# Patient Record
Sex: Male | Born: 1940 | Race: White | Hispanic: No | Marital: Married | Smoking: Former smoker
Health system: Southern US, Community
[De-identification: ages and names within clinical notes are randomized; demographics above are authoritative.]

## PROBLEM LIST (undated history)

## (undated) DIAGNOSIS — K760 Fatty (change of) liver, not elsewhere classified: Secondary | ICD-10-CM

## (undated) DIAGNOSIS — F410 Panic disorder [episodic paroxysmal anxiety] without agoraphobia: Secondary | ICD-10-CM

## (undated) DIAGNOSIS — N189 Chronic kidney disease, unspecified: Secondary | ICD-10-CM

## (undated) DIAGNOSIS — E119 Type 2 diabetes mellitus without complications: Secondary | ICD-10-CM

## (undated) DIAGNOSIS — G20A1 Parkinson's disease without dyskinesia, without mention of fluctuations: Secondary | ICD-10-CM

## (undated) DIAGNOSIS — N529 Male erectile dysfunction, unspecified: Secondary | ICD-10-CM

## (undated) DIAGNOSIS — G20 Parkinson's disease: Secondary | ICD-10-CM

## (undated) DIAGNOSIS — K219 Gastro-esophageal reflux disease without esophagitis: Secondary | ICD-10-CM

## (undated) DIAGNOSIS — Z860101 Personal history of adenomatous and serrated colon polyps: Secondary | ICD-10-CM

## (undated) DIAGNOSIS — E785 Hyperlipidemia, unspecified: Secondary | ICD-10-CM

## (undated) DIAGNOSIS — E039 Hypothyroidism, unspecified: Secondary | ICD-10-CM

## (undated) DIAGNOSIS — Z86010 Personal history of colonic polyps: Secondary | ICD-10-CM

## (undated) DIAGNOSIS — I10 Essential (primary) hypertension: Secondary | ICD-10-CM

## (undated) DIAGNOSIS — M109 Gout, unspecified: Secondary | ICD-10-CM

## (undated) DIAGNOSIS — L409 Psoriasis, unspecified: Secondary | ICD-10-CM

## (undated) HISTORY — DX: Fatty (change of) liver, not elsewhere classified: K76.0

## (undated) HISTORY — DX: Gastro-esophageal reflux disease without esophagitis: K21.9

## (undated) HISTORY — DX: Psoriasis, unspecified: L40.9

## (undated) HISTORY — DX: Personal history of adenomatous and serrated colon polyps: Z86.0101

## (undated) HISTORY — DX: Personal history of colonic polyps: Z86.010

## (undated) HISTORY — DX: Panic disorder (episodic paroxysmal anxiety): F41.0

## (undated) HISTORY — DX: Male erectile dysfunction, unspecified: N52.9

## (undated) HISTORY — DX: Type 2 diabetes mellitus without complications: E11.9

## (undated) HISTORY — DX: Chronic kidney disease, unspecified: N18.9

## (undated) HISTORY — DX: Hypothyroidism, unspecified: E03.9

## (undated) HISTORY — DX: Essential (primary) hypertension: I10

## (undated) HISTORY — DX: Hyperlipidemia, unspecified: E78.5

## (undated) HISTORY — DX: Gout, unspecified: M10.9

---

## 1999-08-24 ENCOUNTER — Encounter (INDEPENDENT_AMBULATORY_CARE_PROVIDER_SITE_OTHER): Payer: Self-pay | Admitting: *Deleted

## 1999-08-24 ENCOUNTER — Ambulatory Visit (HOSPITAL_COMMUNITY): Admission: RE | Admit: 1999-08-24 | Discharge: 1999-08-24 | Payer: Self-pay | Admitting: *Deleted

## 2002-10-13 ENCOUNTER — Encounter: Payer: Self-pay | Admitting: Family Medicine

## 2002-10-13 ENCOUNTER — Encounter: Admission: RE | Admit: 2002-10-13 | Discharge: 2002-10-13 | Payer: Self-pay | Admitting: Family Medicine

## 2003-01-03 ENCOUNTER — Encounter (INDEPENDENT_AMBULATORY_CARE_PROVIDER_SITE_OTHER): Payer: Self-pay | Admitting: Specialist

## 2003-01-03 ENCOUNTER — Ambulatory Visit (HOSPITAL_COMMUNITY): Admission: RE | Admit: 2003-01-03 | Discharge: 2003-01-03 | Payer: Self-pay | Admitting: *Deleted

## 2004-02-10 ENCOUNTER — Encounter: Admission: RE | Admit: 2004-02-10 | Discharge: 2004-02-10 | Payer: Self-pay | Admitting: Family Medicine

## 2004-02-17 ENCOUNTER — Encounter: Admission: RE | Admit: 2004-02-17 | Discharge: 2004-02-17 | Payer: Self-pay | Admitting: Family Medicine

## 2009-08-24 ENCOUNTER — Encounter: Admission: RE | Admit: 2009-08-24 | Discharge: 2009-08-24 | Payer: Self-pay | Admitting: Family Medicine

## 2010-10-19 NOTE — Op Note (Signed)
[place]  Patient:    [name], [name]                         MRN: [id number] Proc. Date: 08/24/99 Adm. Date:  [id number] Disc. Date: [id number] Attending:  Mingo Amber CC:         Dyanne Carrel, M.D.                           Operative Report  PROCEDURES:  Video upper endoscopy and video colonoscopy.  INDICATIONS FOR PROCEDURE:  This is a 70 year old male referred by Dr. Manus Gunning for guaiac positive stool and mild anemia.  He has been taking aspirin and he has a family history of colonic polyps.  PREPARATION:  He is NPO since midnight having taken Phospho-Soda prep and a clear liquid diet.  The mucosa is clean throughout.  PREPROCEDURE SEDATION:  Prior to the upper endoscopy, he received 100 mg of Demerol and 10 mg of versed intravenously.  In addition, he was on two liters of nasal cannula O2.  DESCRIPTION OF THE PROCEDURE:  The Olympus video upper endoscope was inserted via the mouth and advanced easily through the upper esophageal sphincter. Intubation was then carried out to the descending duodenum.  On withdrawal the mucosa was carefully evaluated.  The descending duodenum and bulb appeared normal as did the antrum of the stomach.  On retroflexed view of the GE junction and fundus it was apparent that there was mild fundic gastritis.  On withdrawal through the GE junction, there was minimal distal esophagitis.  At 39 cm a biopsy was taken.  At the conclusion of this procedure, the patients position was reversed for procedure #2, video colonoscopy.  He received an additional 40 mg of Demerol for sedation.  The Olympus video colonoscope was inserted via the rectum and advanced easily through the entire colon to the cecum.  On withdrawal the mucosa was carefully evaluated.  The entire right colon appeared normal. Within the sigmoid colon at 35 cm was a 2.5 cm polyp that was removed with electrosnare cautery and recovered for  histologic evaluation.  At the rectal area was a second 1 cm sessile polyp that was similarly snared.  Retroflexed view of the rectum was unremarkable.  The patient tolerated the procedures well.  Pulse, blood pressure, and oximetry testing were stable throughout.  He was observed in recovery 45 minutes and discharged home alert with a benign abdomen.  As of the time of this dictation, the CLO test is available and is negative and pathology reports revealed mild reflux esophagitis without Barretts metaplasia.  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  The patient returned to the office p.r.n. symptoms.  He needs a repeat colonoscopy in three years to assess for any recurrence of polyps. DD:  10/04/99 TD:  10/07/99 Job: [id number]

## 2011-10-29 ENCOUNTER — Other Ambulatory Visit: Payer: Self-pay | Admitting: Family Medicine

## 2011-10-29 ENCOUNTER — Ambulatory Visit
Admission: RE | Admit: 2011-10-29 | Discharge: 2011-10-29 | Disposition: A | Discharge status: Home / Self Care | Payer: No Typology Code available for payment source | Source: Ambulatory Visit | Attending: Family Medicine | Admitting: Family Medicine

## 2011-10-29 DIAGNOSIS — Z006 Encounter for examination for normal comparison and control in clinical research program: Secondary | ICD-10-CM

## 2012-02-28 ENCOUNTER — Encounter: Payer: Self-pay | Admitting: Cardiovascular Disease

## 2014-06-06 DIAGNOSIS — L414 Large plaque parapsoriasis: Secondary | ICD-10-CM | POA: Diagnosis not present

## 2014-09-02 ENCOUNTER — Encounter: Payer: Self-pay | Admitting: *Deleted

## 2014-10-21 DIAGNOSIS — I129 Hypertensive chronic kidney disease with stage 1 through stage 4 chronic kidney disease, or unspecified chronic kidney disease: Secondary | ICD-10-CM | POA: Diagnosis not present

## 2014-10-21 DIAGNOSIS — N400 Enlarged prostate without lower urinary tract symptoms: Secondary | ICD-10-CM | POA: Diagnosis not present

## 2014-10-21 DIAGNOSIS — L409 Psoriasis, unspecified: Secondary | ICD-10-CM | POA: Diagnosis not present

## 2014-10-21 DIAGNOSIS — E039 Hypothyroidism, unspecified: Secondary | ICD-10-CM | POA: Diagnosis not present

## 2014-10-21 DIAGNOSIS — E1129 Type 2 diabetes mellitus with other diabetic kidney complication: Secondary | ICD-10-CM | POA: Diagnosis not present

## 2014-10-21 DIAGNOSIS — E780 Pure hypercholesterolemia: Secondary | ICD-10-CM | POA: Diagnosis not present

## 2014-10-21 DIAGNOSIS — K219 Gastro-esophageal reflux disease without esophagitis: Secondary | ICD-10-CM | POA: Diagnosis not present

## 2014-10-21 DIAGNOSIS — N183 Chronic kidney disease, stage 3 (moderate): Secondary | ICD-10-CM | POA: Diagnosis not present

## 2015-01-03 DIAGNOSIS — D126 Benign neoplasm of colon, unspecified: Secondary | ICD-10-CM | POA: Diagnosis not present

## 2015-01-03 DIAGNOSIS — D120 Benign neoplasm of cecum: Secondary | ICD-10-CM | POA: Diagnosis not present

## 2015-01-03 DIAGNOSIS — D123 Benign neoplasm of transverse colon: Secondary | ICD-10-CM | POA: Diagnosis not present

## 2015-01-03 DIAGNOSIS — K5730 Diverticulosis of large intestine without perforation or abscess without bleeding: Secondary | ICD-10-CM | POA: Diagnosis not present

## 2015-01-03 DIAGNOSIS — D125 Benign neoplasm of sigmoid colon: Secondary | ICD-10-CM | POA: Diagnosis not present

## 2015-01-03 DIAGNOSIS — K635 Polyp of colon: Secondary | ICD-10-CM | POA: Diagnosis not present

## 2015-01-03 DIAGNOSIS — Z86010 Personal history of colonic polyps: Secondary | ICD-10-CM | POA: Diagnosis not present

## 2015-01-03 DIAGNOSIS — Z09 Encounter for follow-up examination after completed treatment for conditions other than malignant neoplasm: Secondary | ICD-10-CM | POA: Diagnosis not present

## 2015-04-04 DIAGNOSIS — M25512 Pain in left shoulder: Secondary | ICD-10-CM | POA: Diagnosis not present

## 2015-04-07 DIAGNOSIS — M7542 Impingement syndrome of left shoulder: Secondary | ICD-10-CM | POA: Diagnosis not present

## 2015-04-07 DIAGNOSIS — M25512 Pain in left shoulder: Secondary | ICD-10-CM | POA: Diagnosis not present

## 2015-04-24 DIAGNOSIS — E1129 Type 2 diabetes mellitus with other diabetic kidney complication: Secondary | ICD-10-CM | POA: Diagnosis not present

## 2015-04-24 DIAGNOSIS — L409 Psoriasis, unspecified: Secondary | ICD-10-CM | POA: Diagnosis not present

## 2015-04-24 DIAGNOSIS — N183 Chronic kidney disease, stage 3 (moderate): Secondary | ICD-10-CM | POA: Diagnosis not present

## 2015-04-24 DIAGNOSIS — N400 Enlarged prostate without lower urinary tract symptoms: Secondary | ICD-10-CM | POA: Diagnosis not present

## 2015-04-24 DIAGNOSIS — E7800 Pure hypercholesterolemia, unspecified: Secondary | ICD-10-CM | POA: Diagnosis not present

## 2015-04-24 DIAGNOSIS — I129 Hypertensive chronic kidney disease with stage 1 through stage 4 chronic kidney disease, or unspecified chronic kidney disease: Secondary | ICD-10-CM | POA: Diagnosis not present

## 2015-04-24 DIAGNOSIS — E039 Hypothyroidism, unspecified: Secondary | ICD-10-CM | POA: Diagnosis not present

## 2015-04-24 DIAGNOSIS — K219 Gastro-esophageal reflux disease without esophagitis: Secondary | ICD-10-CM | POA: Diagnosis not present

## 2015-05-22 DIAGNOSIS — E119 Type 2 diabetes mellitus without complications: Secondary | ICD-10-CM | POA: Diagnosis not present

## 2015-08-07 DIAGNOSIS — J069 Acute upper respiratory infection, unspecified: Secondary | ICD-10-CM | POA: Diagnosis not present

## 2015-08-08 DIAGNOSIS — H1012 Acute atopic conjunctivitis, left eye: Secondary | ICD-10-CM | POA: Diagnosis not present

## 2015-08-15 DIAGNOSIS — H1012 Acute atopic conjunctivitis, left eye: Secondary | ICD-10-CM | POA: Diagnosis not present

## 2015-10-23 DIAGNOSIS — Z1389 Encounter for screening for other disorder: Secondary | ICD-10-CM | POA: Diagnosis not present

## 2015-10-23 DIAGNOSIS — I129 Hypertensive chronic kidney disease with stage 1 through stage 4 chronic kidney disease, or unspecified chronic kidney disease: Secondary | ICD-10-CM | POA: Diagnosis not present

## 2015-10-23 DIAGNOSIS — L409 Psoriasis, unspecified: Secondary | ICD-10-CM | POA: Diagnosis not present

## 2015-10-23 DIAGNOSIS — N183 Chronic kidney disease, stage 3 (moderate): Secondary | ICD-10-CM | POA: Diagnosis not present

## 2015-10-23 DIAGNOSIS — E039 Hypothyroidism, unspecified: Secondary | ICD-10-CM | POA: Diagnosis not present

## 2015-10-23 DIAGNOSIS — E1129 Type 2 diabetes mellitus with other diabetic kidney complication: Secondary | ICD-10-CM | POA: Diagnosis not present

## 2015-10-23 DIAGNOSIS — Z23 Encounter for immunization: Secondary | ICD-10-CM | POA: Diagnosis not present

## 2015-10-23 DIAGNOSIS — F411 Generalized anxiety disorder: Secondary | ICD-10-CM | POA: Diagnosis not present

## 2015-10-23 DIAGNOSIS — F39 Unspecified mood [affective] disorder: Secondary | ICD-10-CM | POA: Diagnosis not present

## 2015-10-23 DIAGNOSIS — E7800 Pure hypercholesterolemia, unspecified: Secondary | ICD-10-CM | POA: Diagnosis not present

## 2015-10-23 DIAGNOSIS — N400 Enlarged prostate without lower urinary tract symptoms: Secondary | ICD-10-CM | POA: Diagnosis not present

## 2016-05-02 DIAGNOSIS — F411 Generalized anxiety disorder: Secondary | ICD-10-CM | POA: Diagnosis not present

## 2016-05-02 DIAGNOSIS — F39 Unspecified mood [affective] disorder: Secondary | ICD-10-CM | POA: Diagnosis not present

## 2016-05-02 DIAGNOSIS — N183 Chronic kidney disease, stage 3 (moderate): Secondary | ICD-10-CM | POA: Diagnosis not present

## 2016-05-02 DIAGNOSIS — E1129 Type 2 diabetes mellitus with other diabetic kidney complication: Secondary | ICD-10-CM | POA: Diagnosis not present

## 2016-05-02 DIAGNOSIS — E039 Hypothyroidism, unspecified: Secondary | ICD-10-CM | POA: Diagnosis not present

## 2016-05-02 DIAGNOSIS — E7800 Pure hypercholesterolemia, unspecified: Secondary | ICD-10-CM | POA: Diagnosis not present

## 2016-05-02 DIAGNOSIS — L409 Psoriasis, unspecified: Secondary | ICD-10-CM | POA: Diagnosis not present

## 2016-05-02 DIAGNOSIS — I129 Hypertensive chronic kidney disease with stage 1 through stage 4 chronic kidney disease, or unspecified chronic kidney disease: Secondary | ICD-10-CM | POA: Diagnosis not present

## 2016-05-02 DIAGNOSIS — N400 Enlarged prostate without lower urinary tract symptoms: Secondary | ICD-10-CM | POA: Diagnosis not present

## 2016-07-01 DIAGNOSIS — H1013 Acute atopic conjunctivitis, bilateral: Secondary | ICD-10-CM | POA: Diagnosis not present

## 2016-07-08 DIAGNOSIS — E119 Type 2 diabetes mellitus without complications: Secondary | ICD-10-CM | POA: Diagnosis not present

## 2016-11-05 DIAGNOSIS — E7800 Pure hypercholesterolemia, unspecified: Secondary | ICD-10-CM | POA: Diagnosis not present

## 2016-11-05 DIAGNOSIS — F411 Generalized anxiety disorder: Secondary | ICD-10-CM | POA: Diagnosis not present

## 2016-11-05 DIAGNOSIS — I129 Hypertensive chronic kidney disease with stage 1 through stage 4 chronic kidney disease, or unspecified chronic kidney disease: Secondary | ICD-10-CM | POA: Diagnosis not present

## 2016-11-05 DIAGNOSIS — N183 Chronic kidney disease, stage 3 (moderate): Secondary | ICD-10-CM | POA: Diagnosis not present

## 2016-11-05 DIAGNOSIS — E1129 Type 2 diabetes mellitus with other diabetic kidney complication: Secondary | ICD-10-CM | POA: Diagnosis not present

## 2016-11-05 DIAGNOSIS — Z7984 Long term (current) use of oral hypoglycemic drugs: Secondary | ICD-10-CM | POA: Diagnosis not present

## 2016-11-05 DIAGNOSIS — E039 Hypothyroidism, unspecified: Secondary | ICD-10-CM | POA: Diagnosis not present

## 2016-11-05 DIAGNOSIS — Z1389 Encounter for screening for other disorder: Secondary | ICD-10-CM | POA: Diagnosis not present

## 2016-11-05 DIAGNOSIS — N400 Enlarged prostate without lower urinary tract symptoms: Secondary | ICD-10-CM | POA: Diagnosis not present

## 2017-04-01 DIAGNOSIS — H5210 Myopia, unspecified eye: Secondary | ICD-10-CM | POA: Diagnosis not present

## 2017-04-01 DIAGNOSIS — H31012 Macula scars of posterior pole (postinflammatory) (post-traumatic), left eye: Secondary | ICD-10-CM | POA: Diagnosis not present

## 2017-05-07 ENCOUNTER — Encounter: Payer: Self-pay | Admitting: Cardiovascular Disease

## 2017-05-07 DIAGNOSIS — I129 Hypertensive chronic kidney disease with stage 1 through stage 4 chronic kidney disease, or unspecified chronic kidney disease: Secondary | ICD-10-CM | POA: Diagnosis not present

## 2017-05-07 DIAGNOSIS — E7800 Pure hypercholesterolemia, unspecified: Secondary | ICD-10-CM | POA: Diagnosis not present

## 2017-05-07 DIAGNOSIS — E1129 Type 2 diabetes mellitus with other diabetic kidney complication: Secondary | ICD-10-CM | POA: Diagnosis not present

## 2017-05-07 DIAGNOSIS — L409 Psoriasis, unspecified: Secondary | ICD-10-CM | POA: Diagnosis not present

## 2017-05-07 DIAGNOSIS — F411 Generalized anxiety disorder: Secondary | ICD-10-CM | POA: Diagnosis not present

## 2017-05-07 DIAGNOSIS — E039 Hypothyroidism, unspecified: Secondary | ICD-10-CM | POA: Diagnosis not present

## 2017-05-07 DIAGNOSIS — N400 Enlarged prostate without lower urinary tract symptoms: Secondary | ICD-10-CM | POA: Diagnosis not present

## 2017-05-07 DIAGNOSIS — F39 Unspecified mood [affective] disorder: Secondary | ICD-10-CM | POA: Diagnosis not present

## 2017-05-07 DIAGNOSIS — N183 Chronic kidney disease, stage 3 (moderate): Secondary | ICD-10-CM | POA: Diagnosis not present

## 2017-07-10 ENCOUNTER — Encounter: Payer: Self-pay | Admitting: Cardiovascular Disease

## 2017-07-10 DIAGNOSIS — R0600 Dyspnea, unspecified: Secondary | ICD-10-CM | POA: Diagnosis not present

## 2017-07-10 DIAGNOSIS — R001 Bradycardia, unspecified: Secondary | ICD-10-CM | POA: Diagnosis not present

## 2017-07-14 ENCOUNTER — Encounter: Payer: Self-pay | Admitting: Cardiovascular Disease

## 2017-07-14 ENCOUNTER — Ambulatory Visit: Payer: Medicare HMO | Admitting: Cardiovascular Disease

## 2017-07-14 VITALS — BP 132/80 | HR 61 | Ht 72.0 in | Wt 354.6 lb

## 2017-07-14 DIAGNOSIS — R0683 Snoring: Secondary | ICD-10-CM

## 2017-07-14 DIAGNOSIS — R0600 Dyspnea, unspecified: Secondary | ICD-10-CM

## 2017-07-14 DIAGNOSIS — Z6841 Body Mass Index (BMI) 40.0 and over, adult: Secondary | ICD-10-CM

## 2017-07-14 DIAGNOSIS — E039 Hypothyroidism, unspecified: Secondary | ICD-10-CM

## 2017-07-14 DIAGNOSIS — L409 Psoriasis, unspecified: Secondary | ICD-10-CM

## 2017-07-14 DIAGNOSIS — I10 Essential (primary) hypertension: Secondary | ICD-10-CM | POA: Diagnosis not present

## 2017-07-14 DIAGNOSIS — I4589 Other specified conduction disorders: Secondary | ICD-10-CM

## 2017-07-14 DIAGNOSIS — R0681 Apnea, not elsewhere classified: Secondary | ICD-10-CM

## 2017-07-14 MED ORDER — FUROSEMIDE 40 MG PO TABS: 40.0000 mg | ORAL_TABLET | Freq: Every day | ORAL | 3 refills | Status: DC

## 2017-07-14 NOTE — Patient Instructions (Signed)
Medication Instructions:  INCREASE furosemide (Lasix) to 40 mg daily  Testing/Procedures: Your physician has requested that you have an echocardiogram THIS WEEK. Echocardiography is a painless test that uses sound waves to create images of your heart. It provides your doctor with information about the size and shape of your heart and how well your heart's chambers and valves are working. This procedure takes approximately one hour. There are no restrictions for this procedure.  This will be done at our [facility] location:  [place] has recommended that you have a sleep study ASAP. This test records several body functions during sleep, including: brain activity, eye movement, oxygen and carbon dioxide blood levels, heart rate and rhythm, breathing rate and rhythm, the flow of air through your mouth and nose, snoring, body muscle movements, and chest and belly movement. --this must be pre-approved by insurance prior to scheduling. Once approved, someone will call to schedule.  Your physician has recommended that you wear a 14 day event monitor. Event monitors are medical devices that record the heart's electrical activity. Doctors most often [place] these monitors to diagnose arrhythmias. Arrhythmias are problems with the speed or rhythm of the heartbeat. The monitor is a small, portable device. You can wear one while you do your normal daily activities. This is usually used to diagnose what is causing palpitations/syncope (passing out).   Follow-Up: 3-4 weeks with Dr. Claiborne Billings.  Any Other Special Instructions Will Be Listed Below (If Applicable).     If you need a refill on your cardiac medications before your next appointment, please call your pharmacy.

## 2017-07-14 NOTE — Progress Notes (Signed)
Cardiology Office Note    Date:  07/21/2017   ID:  [name], DOB 1940-10-25, MRN [id number]  PCP:  Mike Arabian, MD  Cardiologist:  Mike Majestic, MD   New cardiology evaluation, referred through the courtesy of Dr. Gaynelle Green for evaluation of bradycardia and increasing shortness of breath.  History of Present Illness:  [name] is a 77 y.o. male who is referred through the courtesy of Dr. Marisue Green for evaluation of bradycardia and shortness of breath.  The patient is the first cousin of [name] who recently established cardiology care with me.  [name] has history of hypertension, diabetes mellitus, hyperlipidemia, in addition to hypothyroidism.  He has had a history of presumed anxiety and panic attacks.  He admits to significant dyspnea on exertion and also has noticed episodes of significant shortness of breath while sleeping.  Upon further questioning, patient omits to loud snoring.  He wakes up gasping for breath and then apparently has a panic attack.  Sleep is nonrestorative.  He admits to daytime sleepiness.  He has never been evaluated for sleep apnea.  He was recently seen by [name] and his ECG was reported to show sinus bradycardia with heart rates in the low 40s.  He denies any presyncope or syncope.  He is not very active.  He has morbid obesity.  He also has a significant history for psoriasis.  Laboratory in December 2018 by MikeEhinger had shown mild renal insufficiency with a creatinine of 1.50.  Cholesterol was 119 with an LDL of 64.  He presents for cardiology evaluation  Past Medical History:  Diagnosis Date  . CKD (chronic kidney disease)   . DM type 2 (diabetes mellitus, type 2) ([place])   . ED (erectile dysfunction)   . Fatty liver   . GERD (gastroesophageal reflux disease)   . Gout   . H/O adenomatous polyp of colon   . HLD (hyperlipidemia)   . HTN (hypertension)   . Hypothyroid   . Panic attacks   . Psoriasis     Past medical history is  notable for type 2 diabetes mellitus, hyperlipidemia, hypothyroidism, hypertension, obesity, psoriasis, GERD, erectile dysfunction, chronic kidney disease.  Current Medications: Outpatient Medications Prior to Visit  Medication Sig Dispense Refill  . ALPRAZolam (XANAX) 1 MG tablet Take 1 tablet by mouth as directed.    Marland Kitchen aspirin 81 MG EC tablet Take 1 tablet by mouth daily.    Marland Kitchen atorvastatin (LIPITOR) 40 MG tablet Take by mouth daily. Patient takes 20 mg daily    . glimepiride (AMARYL) 1 MG tablet Take 1 tablet by mouth daily.    Marland Kitchen levothyroxine (SYNTHROID, LEVOTHROID) 175 MCG tablet Take 1 tablet by mouth daily.    Marland Kitchen losartan (COZAAR) 50 MG tablet Take 1 tablet by mouth daily.    . metFORMIN (GLUCOPHAGE) 1000 MG tablet Take 1 tablet by mouth daily.    Marland Kitchen PARoxetine (PAXIL) 40 MG tablet Take 1 tablet by mouth as directed.    . furosemide (LASIX) 20 MG tablet Take 1 tablet by mouth daily.  2   No facility-administered medications prior to visit.      Allergies:   Hctz [hydrochlorothiazide]; Lexapro [escitalopram oxalate]; and Lisinopril   Social History   Socioeconomic History  . Marital status: Married    Spouse name: None  . Number of children: None  . Years of education: None  . Highest education level: None  Social Needs  . Financial resource strain: None  .  Food insecurity - worry: None  . Food insecurity - inability: None  . Transportation needs - medical: None  . Transportation needs - non-medical: None  Occupational History  . None  Tobacco Use  . Smoking status: Never Smoker  . Smokeless tobacco: Never Used  Substance and Sexual Activity  . Alcohol use: No    Alcohol/week: 0.0 oz  . Drug use: No  . Sexual activity: None  Other Topics Concern  . None  Social History Narrative  . None    Additional social history is notable that he is married 47 years.  He has 4 children, 9 grandchildren, and 4 great-grandchildren and 5 great great-grandchildren.  He completed 2  years of college.  He is retired from [employer] .  There is no tobacco use.  He does not exercise.  Family History:  Family history is notable that his mother died at age 44.  She never recovered after she broke her hip.  Her father died at age 18 and had emphysema.  One brother died of an MI at age 30.  Another brother died at age 56 and had methicillin-resistant staph infection.  One sister died at age 39 with cancer, another sister had lung cancer, and one sister is living at 46.  Patient's 4 children   ROS General: Negative; No fevers, chills, or night sweats;  HEENT: Negative; No changes in vision or hearing, sinus congestion, difficulty swallowing Pulmonary: Positive for shortness of breath and wheezing Cardiovascular: see HPI GI: Negative; No nausea, vomiting, diarrhea, or abdominal pain GU: Negative; No dysuria, hematuria, or difficulty voiding Musculoskeletal: History of gout Hematologic/Oncology: Negative; no easy bruising, bleeding Endocrine: Positive for hypothyroidism and type 2 diabetes mellitus Neuro: Negative; no changes in balance, headaches Skin: Negative; No rashes or skin lesions Psychiatric: Negative; No behavioral problems, depression Sleep: Negative; No snoring, daytime sleepiness, hypersomnolence, bruxism, restless legs, hypnogognic hallucinations, no cataplexy Other comprehensive 14 point system review is negative.   PHYSICAL EXAM:   VS:  BP 132/80   Pulse 61   Ht 6' (1.829 m)   Wt (!) 354 lb 9.6 oz (160.8 kg)   BMI 48.09 kg/m     Repeat blood pressure by me 112/80.  Wt Readings from Last 3 Encounters:  07/14/17 (!) 354 lb 9.6 oz (160.8 kg)    General: Alert, oriented, no distress.  Skin: Numerous psoriatic lesions HEENT: Normocephalic, atraumatic. Pupils equal round and reactive to light; sclera anicteric; extraocular muscles intact; Fundi no hemorrhages or exudates.  Disks flat Nose without nasal septal hypertrophy Mouth/Parynx benign; Mallinpatti  scale 3/4 Neck: No JVD, no carotid bruits; normal carotid upstroke Lungs: Rhonchi and decreased breath sounds bilaterally Chest wall: without tenderness to palpitation Heart: PMI not displaced, RRR, s1 s2 normal, 1/6 systolic murmur, no diastolic murmur, no rubs, gallops, thrills, or heaves Abdomen: Significant central adiposity; soft, nontender; no hepatosplenomehaly, BS+; abdominal aorta nontender and not dilated by palpation. Back: no CVA tenderness Pulses 2+ Musculoskeletal: full range of motion, normal strength, no joint deformities Extremities: Trace ankle edema bilaterally, no clubbing cyanosis, Homan's sign negative  Neurologic: grossly nonfocal; Cranial nerves grossly wnl Psychologic: Normal mood and affect   Studies/Labs Reviewed:   EKG:  EKG is ordered today.  ECG (independently read by me): Probable A-V dissociation with atrial rate approximatel 83 bpm.  Ventricular rate is variable in the 60s to 70 range with right bundle branch block morphology.  Left axis deviation.  Recent Labs: No flowsheet data found.   No  flowsheet data found.  No flowsheet data found. No results found for: MCV No results found for: TSH No results found for: HGBA1C   BNP No results found for: BNP  ProBNP No results found for: PROBNP   Lipid Panel  No results found for: CHOL, TRIG, HDL, CHOLHDL, VLDL, LDLCALC, LDLDIRECT   RADIOLOGY: No results found.   Additional studies/ records that were reviewed today include:  I reviewed the office records from Dr. Gaynelle Green    ASSESSMENT:    1. Dyspnea, unspecified type   2. Hypertension, unspecified type   3. Class 3 severe obesity with body mass index (BMI) of 45.0 to 49.9 in adult, unspecified obesity type, unspecified whether serious comorbidity present ([place])   4. Snoring   5. Atrioventricular (AV) dissociation   6. Witnessed episode of apnea   7. Morbid obesity ([place])   8. Hypothyroidism, unspecified type   9. Psoriasis       PLAN:  Mr. Beck Cofer is a 77 year old gentleman who has a history of morbid obesity, type 2 diabetes mellitus, hypothyroidism, hyperlipidemia, psoriasis, who is developed exertional dyspnea.  He also has been labeled as having frequent panic attacks.  Upon further questioning, the patient often wakes up gasping for breath while sleeping and I suspect some of this may be component of severe obstructive sleep apnea.  He has a history of loud snoring, cannot sleep on his back, his sleep is nonrestorative, and he admits to daytime fatigue.  I am recommending he undergo an echo Doppler study to evaluate both systolic and diastolic function as well as valvular architecture.  I reviewed the ECGs from his primary care's office. On one ECG there was suggestion of sinus bradycardia.  However, on the other ECG there was suggestion of possible A-V dissociation.  His ECG done today in the office today shows atrial rate at approximately 83 and a ventricular rate at 61 with right bundle branch morphology to the ventricular beat.  He has been on a medical regimen consisting of furosemide 20 mg, losartan 50 [place] grams daily for blood pressure.  I am recommending further titration of furosemide 40 mg daily, which will also improve his shortness of breath.  I suspect he may very well have diastolic dysfunction.  He was previously on metoprolol but this was stopped 2-3 days ago, which may still be present in his system to account for some of his bradycardia arrhythmia and possible A-V dissociation.  I suspect he has obstructive sleep apnea which may also be contributing to nocturnal bradycardia arrhythmia.  I am scheduling him for a sleep study for further evaluation.  I will also schedule him for a cardiac monitor to evaluate his cardiac rhythm once metoprolol has been weaned out of his system.  I will see him in the office in 3-4 weeks for reevaluation.  Medication Adjustments/Labs and Tests Ordered: Current  medicines are reviewed at length with the patient today.  Concerns regarding medicines are outlined above.  Medication changes, Labs and Tests ordered today are listed in the Patient Instructions below. Patient Instructions  Medication Instructions:  INCREASE furosemide (Lasix) to 40 mg daily  Testing/Procedures: Your physician has requested that you have an echocardiogram THIS WEEK. Echocardiography is a painless test that uses sound waves to create images of your heart. It provides your doctor with information about the size and shape of your heart and how well your heart's chambers and valves are working. This procedure takes approximately one hour. There are no restrictions  for this procedure.  This will be done at our [facility] location:  [place] has recommended that you have a sleep study ASAP. This test records several body functions during sleep, including: brain activity, eye movement, oxygen and carbon dioxide blood levels, heart rate and rhythm, breathing rate and rhythm, the flow of air through your mouth and nose, snoring, body muscle movements, and chest and belly movement. --this must be pre-approved by insurance prior to scheduling. Once approved, someone will call to schedule.  Your physician has recommended that you wear a 14 day event monitor. Event monitors are medical devices that record the heart's electrical activity. Doctors most often [place] these monitors to diagnose arrhythmias. Arrhythmias are problems with the speed or rhythm of the heartbeat. The monitor is a small, portable device. You can wear one while you do your normal daily activities. This is usually used to diagnose what is causing palpitations/syncope (passing out).   Follow-Up: 3-4 weeks with Dr. Claiborne Billings.  Any Other Special Instructions Will Be Listed Below (If Applicable).     If you need a refill on your cardiac medications before your next appointment, please call  your pharmacy.      Signed, Mike Majestic, MD  07/21/2017 1:04 PM    [facility] [address], [place], [place], [place]  [address] Phone: [phone]

## 2017-07-17 ENCOUNTER — Other Ambulatory Visit: Payer: Self-pay

## 2017-07-17 ENCOUNTER — Ambulatory Visit (HOSPITAL_COMMUNITY): Payer: Medicare HMO | Attending: Internal Medicine

## 2017-07-17 DIAGNOSIS — I4589 Other specified conduction disorders: Secondary | ICD-10-CM

## 2017-07-17 DIAGNOSIS — I129 Hypertensive chronic kidney disease with stage 1 through stage 4 chronic kidney disease, or unspecified chronic kidney disease: Secondary | ICD-10-CM | POA: Diagnosis not present

## 2017-07-17 DIAGNOSIS — E1122 Type 2 diabetes mellitus with diabetic chronic kidney disease: Secondary | ICD-10-CM | POA: Diagnosis not present

## 2017-07-17 DIAGNOSIS — R0600 Dyspnea, unspecified: Secondary | ICD-10-CM | POA: Diagnosis not present

## 2017-07-17 DIAGNOSIS — I10 Essential (primary) hypertension: Secondary | ICD-10-CM | POA: Diagnosis not present

## 2017-07-17 DIAGNOSIS — N189 Chronic kidney disease, unspecified: Secondary | ICD-10-CM | POA: Diagnosis not present

## 2017-07-17 DIAGNOSIS — E785 Hyperlipidemia, unspecified: Secondary | ICD-10-CM | POA: Diagnosis not present

## 2017-07-17 MED ORDER — PERFLUTREN LIPID MICROSPHERE
1.0000 mL | INTRAVENOUS | Status: AC | PRN
  Administered 2017-07-17: 3 mL via INTRAVENOUS

## 2017-07-21 ENCOUNTER — Encounter: Payer: Self-pay | Admitting: Cardiovascular Disease

## 2017-07-23 ENCOUNTER — Ambulatory Visit (INDEPENDENT_AMBULATORY_CARE_PROVIDER_SITE_OTHER): Payer: Medicare HMO

## 2017-07-23 ENCOUNTER — Other Ambulatory Visit: Payer: Self-pay | Admitting: Cardiovascular Disease

## 2017-07-23 DIAGNOSIS — R0600 Dyspnea, unspecified: Secondary | ICD-10-CM

## 2017-07-23 DIAGNOSIS — I4589 Other specified conduction disorders: Secondary | ICD-10-CM

## 2017-07-23 DIAGNOSIS — R001 Bradycardia, unspecified: Secondary | ICD-10-CM | POA: Diagnosis not present

## 2017-07-29 ENCOUNTER — Telehealth: Payer: Self-pay | Admitting: Cardiovascular Disease

## 2017-07-29 NOTE — Telephone Encounter (Signed)
Returned call to patient. Explained that he should wear the monitor to the sleep study appointment and if he has issues, he can remove it for the duration of the sleep study. Advised it was ordered to evaluate bradycardia so it is advisable to wear as long as possible. He states he gets all tangled up in the monitor.   Also notified patient of his echo results while on the phone.

## 2017-07-29 NOTE — Telephone Encounter (Signed)
New Message   Per pt wants to know if he can discontinue monitor Friday since he has to come in and take sleep test. Requesting call back

## 2017-07-29 NOTE — Telephone Encounter (Signed)
Follow Up ° ° °Pt returning phone call. Requesting call back. °

## 2017-07-29 NOTE — Telephone Encounter (Signed)
Attempted to return call to patient. Phone rang, no answer 

## 2017-08-01 ENCOUNTER — Ambulatory Visit (HOSPITAL_BASED_OUTPATIENT_CLINIC_OR_DEPARTMENT_OTHER): Payer: Medicare HMO | Attending: Cardiovascular Disease | Admitting: Cardiovascular Disease

## 2017-08-01 VITALS — Ht 72.0 in | Wt 352.0 lb

## 2017-08-01 DIAGNOSIS — R0681 Apnea, not elsewhere classified: Secondary | ICD-10-CM

## 2017-08-01 DIAGNOSIS — R0683 Snoring: Secondary | ICD-10-CM | POA: Diagnosis not present

## 2017-08-01 DIAGNOSIS — Z79899 Other long term (current) drug therapy: Secondary | ICD-10-CM | POA: Diagnosis not present

## 2017-08-01 DIAGNOSIS — R0600 Dyspnea, unspecified: Secondary | ICD-10-CM

## 2017-08-01 DIAGNOSIS — Z6841 Body Mass Index (BMI) 40.0 and over, adult: Secondary | ICD-10-CM

## 2017-08-01 DIAGNOSIS — G4733 Obstructive sleep apnea (adult) (pediatric): Secondary | ICD-10-CM | POA: Diagnosis not present

## 2017-08-01 DIAGNOSIS — Z79890 Hormone replacement therapy (postmenopausal): Secondary | ICD-10-CM | POA: Diagnosis not present

## 2017-08-01 DIAGNOSIS — I10 Essential (primary) hypertension: Secondary | ICD-10-CM | POA: Diagnosis not present

## 2017-08-01 DIAGNOSIS — G4736 Sleep related hypoventilation in conditions classified elsewhere: Secondary | ICD-10-CM | POA: Insufficient documentation

## 2017-08-01 DIAGNOSIS — Z7984 Long term (current) use of oral hypoglycemic drugs: Secondary | ICD-10-CM | POA: Diagnosis not present

## 2017-08-01 DIAGNOSIS — G4730 Sleep apnea, unspecified: Secondary | ICD-10-CM | POA: Diagnosis present

## 2017-08-01 DIAGNOSIS — Z7982 Long term (current) use of aspirin: Secondary | ICD-10-CM | POA: Insufficient documentation

## 2017-08-01 DIAGNOSIS — I4589 Other specified conduction disorders: Secondary | ICD-10-CM

## 2017-08-10 ENCOUNTER — Encounter (HOSPITAL_BASED_OUTPATIENT_CLINIC_OR_DEPARTMENT_OTHER): Payer: Self-pay | Admitting: Cardiovascular Disease

## 2017-08-10 NOTE — Procedures (Signed)
Patient Name: [name], [name] Date: 08/01/2017 Gender: Male D.O.B: 1941-04-24 Age (years): 54 Referring Provider: Shelva Majestic MD, ABSM Height (inches): 72 Interpreting Physician: Shelva Majestic MD, ABSM Weight (lbs): 352 RPSGT: Lanae Boast BMI: 48 MRN: [id number] Neck Size: 21.00  CLINICAL INFORMATION Sleep Study Type: Split Night CPAP  Indication for sleep study: Hypertension, Witnessed Apneas  Epworth Sleepiness Score: 4  SLEEP STUDY TECHNIQUE As per the AASM Manual for the Scoring of Sleep and Associated Events v2.3 (April 2016) with a hypopnea requiring 4% desaturations.  The channels recorded and monitored were frontal, central and occipital EEG, electrooculogram (EOG), submentalis EMG (chin), nasal and oral airflow, thoracic and abdominal wall motion, anterior tibialis EMG, snore microphone, electrocardiogram, and pulse oximetry. Continuous positive airway pressure (CPAP) was initiated when the patient met split night criteria and was titrated according to treat sleep-disordered breathing.  MEDICATIONS     ALPRAZolam (XANAX) 1 MG tablet             aspirin 81 MG EC tablet         atorvastatin (LIPITOR) 40 MG tablet         furosemide (LASIX) 40 MG tablet         glimepiride (AMARYL) 1 MG tablet         levothyroxine (SYNTHROID, LEVOTHROID) 175 MCG tablet         losartan (COZAAR) 50 MG tablet         metFORMIN (GLUCOPHAGE) 1000 MG tablet         PARoxetine (PAXIL) 40 MG tablet      Medications self-administered by patient taken the night of the study : N/A  RESPIRATORY PARAMETERS Diagnostic Total AHI (/hr): 24.6 RDI (/hr): 25.1 OA Index (/hr): 0.5 CA Index (/hr): 0.0 REM AHI (/hr): N/A NREM AHI (/hr): 24.6 Supine AHI (/hr): 54.9 Non-supine AHI (/hr): 23.15 Min O2 Sat (%): 74.0 Mean O2 (%): 88.0 Time below 88% (min): 72.3   Titration  Optimal Pressure (cm):  AHI at Optimal Pressure (/hr): N/A Min O2 at Optimal Pressure (%): 68.0 Supine % at  Optimal (%): N/A Sleep % at Optimal (%): N/A   SLEEP ARCHITECTURE The recording time for the entire night was 473.9 minutes.  During a baseline period of 245.9 minutes, the patient slept for 119.5 minutes in REM and nonREM, yielding a sleep efficiency of 48.6%%. Sleep onset after lights out was 103.7 minutes with a REM latency of N/A minutes. The patient spent 11.3%% of the night in stage N1 sleep, 88.7%% in stage N2 sleep, 0.0%% in stage N3 and 0.0%% in REM.  During the titration period of 225.0 minutes, the patient slept for 141.0 minutes in REM and nonREM, yielding a sleep efficiency of 62.7%%. Sleep onset after CPAP initiation was 22.8 minutes with a REM latency of 165.0 minutes. The patient spent 29.4%% of the night in stage N1 sleep, 53.2%% in stage N2 sleep, 0.0%% in stage N3 and 17.4%% in REM.  CARDIAC DATA The 2 lead EKG demonstrated sinus rhythm. The mean heart rate was 100.0 beats per minute. Other EKG findings include: None.  LEG MOVEMENT DATA The total Periodic Limb Movements of Sleep (PLMS) were 0. The PLMS index was 0.0 .  IMPRESSIONS - Moderate obstructive sleep apnea occurred during the diagnostic portion of the study  (AHI 24.6/h; RDI 25.1). The patient was titrated up to a CPAP pressure of 18 cm water pressure.   AHI was increased at 18 cwp at 41.9/h with significant oxygen  desaturation to 77%.  An optimal PAP pressure could not be selected for this patient based on the available study data. - No significant central sleep apnea occurred during the diagnostic portion of the study (CAI = 0.0/hour). - Reduced sleep efficiency at 48.6% during the diagnostic evaluation. - Absence of REM sleep during the diagnostic portion of the study. - Severe oxygen desaturation to a nadir of 74.0%. - The patient snored with moderate snoring volume during the diagnostic portion of the study. - No cardiac abnormalities were noted during this study. - Clinically significant periodic limb  movements did not occur during sleep.   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 - Efforts should be made to opti,ixe nasal and oropharyngeal patency. - Avoid alcohol, sedatives and other CNS depressants that may worsen sleep apnea and disrupt normal sleep architecture. - Sleep hygiene should be reviewed to assess factors that may improve sleep quality. - Weight management and regular exercise should be initiated or continued. - Recommend sleep clinic for evaluation following BiPAP titration and after 4 weeks of therapy.  [Electronically signed] 08/10/2017 03:14 PM  Shelva Majestic MD, Tomasa Hose Diplomate, American Board of Sleep Medicine   NPI: [id number]  [place] PH: [phone]   FX: [phone] [place]

## 2017-08-12 ENCOUNTER — Ambulatory Visit: Payer: Medicare HMO | Admitting: Cardiovascular Disease

## 2017-08-12 VITALS — BP 152/80 | HR 64 | Ht 72.0 in | Wt 346.8 lb

## 2017-08-12 DIAGNOSIS — Z794 Long term (current) use of insulin: Secondary | ICD-10-CM

## 2017-08-12 DIAGNOSIS — L409 Psoriasis, unspecified: Secondary | ICD-10-CM | POA: Diagnosis not present

## 2017-08-12 DIAGNOSIS — I10 Essential (primary) hypertension: Secondary | ICD-10-CM

## 2017-08-12 DIAGNOSIS — E118 Type 2 diabetes mellitus with unspecified complications: Secondary | ICD-10-CM

## 2017-08-12 DIAGNOSIS — I4589 Other specified conduction disorders: Secondary | ICD-10-CM | POA: Diagnosis not present

## 2017-08-12 DIAGNOSIS — R0600 Dyspnea, unspecified: Secondary | ICD-10-CM

## 2017-08-12 DIAGNOSIS — E039 Hypothyroidism, unspecified: Secondary | ICD-10-CM | POA: Diagnosis not present

## 2017-08-12 DIAGNOSIS — G4733 Obstructive sleep apnea (adult) (pediatric): Secondary | ICD-10-CM

## 2017-08-12 MED ORDER — LOSARTAN POTASSIUM 50 MG PO TABS: 75.0000 mg | ORAL_TABLET | Freq: Every day | ORAL | 3 refills | Status: DC

## 2017-08-12 NOTE — Patient Instructions (Signed)
Medication Instructions:  INCREASE losartan to 75 mg daily  Testing/Procedures: BIPAP titration ASAP-must be approved by insurance prior to scheduling.  Once approved, someone will call to schedule.   Follow-Up: 2-3 months with Dr. Claiborne Billings (sleep clinic)  Any Other Special Instructions Will Be Listed Below (If Applicable).     If you need a refill on your cardiac medications before your next appointment, please call your pharmacy.

## 2017-08-12 NOTE — Progress Notes (Signed)
Cardiology Office Note    Date:  08/18/2017   ID:  [name], DOB 02/24/1941, MRN [id number]  PCP:  [name], MD  Cardiologist:  Shelva Majestic, MD   F/u cardiology evaluation  History of Present Illness:  [name] is a 77 y.o. male who wasreferred through the courtesy of Dr. Marisue Humble for evaluation of bradycardia and shortness of breath.  The patient is the first cousin of [name] who recently established cardiology care with me.  I initially saw him on July 14, 2017.  He presents for one-month follow-up evaluation.  [name] has history of hypertension, diabetes mellitus, hyperlipidemia, in addition to hypothyroidism.  He has had a history of presumed anxiety and panic attacks.  He admits to significant dyspnea on exertion and also has noticed episodes of significant shortness of breath while sleeping.  Upon further questioning, patient omits to loud snoring.  He wakes up gasping for breath and then apparently has a panic attack.  Sleep is nonrestorative.  He admits to daytime sleepiness.  He has never been evaluated for sleep apnea.  He was recently seen by Dr. anger and his ECG was reported to show sinus bradycardia with heart rates in the low 40s.  He denies any presyncope or syncope.  He is not very active.  He has morbid obesity.  He also has a significant history for psoriasis.  Laboratory in December 2018 by Dr.Ehinger had shown mild renal insufficiency with a creatinine of 1.50.  Cholesterol was 119 with an LDL of 64.  H  When I initially saw him, his ECG suggested probable A-V dissociation with an atrial rate at approximately 83 bpm variable ventricular rate in the 60s to 70s with right bundle branch block morphology and left axis deviation.  He was continuing to have shortness of breath and I further titrated furosemide to 40 mg.  His metoprolol had been stopped prior to his office visit with me.  I was very concerned that he most likely had obstructive sleep apnea  which could also contribute to nocturnal bradycardic arrhythmias.  I scheduled him for a sleep study which revealed at least moderate sleep apnea with an AHI of 24.6 on the diagnostic portion of the study; however, the patient was unable to achieve any rim sleep during this segment of the test.  CPAP was initiated and titrated up to 18 cm water pressure.  Despite this pressure.  He continued have significant oxygen desaturation to a nadir of 77%.  I had recommended BiPAP titration giving his suboptimal CPAP titration evaluation but this has not yet been done.  He also wore a monitor which showed periods of time.  His rhythm with first-degree AV block, and PACs, second-degree AV block, and periods of 2-1 AV block, and apparent A-V dissociation.  The patient was asymptomatic.  A 2-D echo Doppler study on for brief 14 2019 showed an EF of 50-55%.  There was a thickened aortic valve with mild aortic stenosis with a peak gradient of 21 and mean gradient of 12.  There was mitral annular calcification.  There was moderate biatrial enlargement.  Acoustical window was poor.  He presents for reevaluation.  He denies any chest pain.  He is breathing better.  He denies presyncope or syncope.  Past Medical History:  Diagnosis Date  . CKD (chronic kidney disease)   . DM type 2 (diabetes mellitus, type 2) ([place])   . ED (erectile dysfunction)   . Fatty liver   . GERD (  gastroesophageal reflux disease)   . Gout   . H/O adenomatous polyp of colon   . HLD (hyperlipidemia)   . HTN (hypertension)   . Hypothyroid   . Panic attacks   . Psoriasis     Past medical history is notable for type 2 diabetes mellitus, hyperlipidemia, hypothyroidism, hypertension, obesity, psoriasis, GERD, erectile dysfunction, chronic kidney disease.  Current Medications: Outpatient Medications Prior to Visit  Medication Sig Dispense Refill  . ALPRAZolam (XANAX) 1 MG tablet Take 1 tablet by mouth as directed.    Marland Kitchen aspirin 81 MG EC tablet  Take 1 tablet by mouth daily.    Marland Kitchen atorvastatin (LIPITOR) 40 MG tablet Take by mouth daily. Patient takes 20 mg daily    . furosemide (LASIX) 40 MG tablet Take 1 tablet (40 mg total) by mouth daily. 90 tablet 3  . glimepiride (AMARYL) 1 MG tablet Take 1 tablet by mouth daily.    Marland Kitchen levothyroxine (SYNTHROID, LEVOTHROID) 175 MCG tablet Take 1 tablet by mouth daily.    . metFORMIN (GLUCOPHAGE) 1000 MG tablet Take 1 tablet by mouth daily.    Marland Kitchen PARoxetine (PAXIL) 40 MG tablet Take 1 tablet by mouth as directed.    Marland Kitchen losartan (COZAAR) 50 MG tablet Take 1 tablet by mouth daily.     No facility-administered medications prior to visit.      Allergies:   Hctz [hydrochlorothiazide]; Lexapro [escitalopram oxalate]; and Lisinopril   Social History   Socioeconomic History  . Marital status: Married    Spouse name: Not on file  . Number of children: Not on file  . Years of education: Not on file  . Highest education level: Not on file  Social Needs  . Financial resource strain: Not on file  . Food insecurity - worry: Not on file  . Food insecurity - inability: Not on file  . Transportation needs - medical: Not on file  . Transportation needs - non-medical: Not on file  Occupational History  . Not on file  Tobacco Use  . Smoking status: Never Smoker  . Smokeless tobacco: Never Used  Substance and Sexual Activity  . Alcohol use: No    Alcohol/week: 0.0 oz  . Drug use: No  . Sexual activity: Not on file  Other Topics Concern  . Not on file  Social History Narrative  . Not on file    Additional social history is notable that he is married 56 years.  He has 4 children, 9 grandchildren, and 4 great-grandchildren and 5 great great-grandchildren.  He completed 2 years of college.  He is retired from [employer]. .  There is no tobacco use.  He does not exercise.  Family History:  Family history is notable that his mother died at age 57.  She never recovered after she broke her hip.  Her father died  at age 4 and had emphysema.  One brother died of an MI at age 45.  Another brother died at age 16 and had methicillin-resistant staph infection.  One sister died at age 83 with cancer, another sister had lung cancer, and one sister is living at 53.  Patient's 4 children   ROS General: Negative; No fevers, chills, or night sweats;  HEENT: Negative; No changes in vision or hearing, sinus congestion, difficulty swallowing Pulmonary: Positive for shortness of breath and wheezing Cardiovascular: see HPI GI: Negative; No nausea, vomiting, diarrhea, or abdominal pain GU: Negative; No dysuria, hematuria, or difficulty voiding Musculoskeletal: History of gout Hematologic/Oncology: Negative; no  easy bruising, bleeding Endocrine: Positive for hypothyroidism and type 2 diabetes mellitus Neuro: Negative; no changes in balance, headaches Skin: Negative; No rashes or skin lesions Psychiatric: Positive for anxiety Sleep: Positive for snoring, daytime sleepiness, no bruxism, restless legs, hypnogognic hallucinations, no cataplexy Other comprehensive 14 point system review is negative.   PHYSICAL EXAM:   VS:  BP (!) 152/80   Pulse 64   Ht 6' (1.829 m)   Wt (!) 346 lb 12.8 oz (157.3 kg)   BMI 47.03 kg/m     Repeat blood pressure by me 122/78  Wt Readings from Last 3 Encounters:  08/12/17 (!) 346 lb 12.8 oz (157.3 kg)  08/01/17 (!) 352 lb (159.7 kg)  07/14/17 (!) 354 lb 9.6 oz (160.8 kg)    General: Alert, oriented, no distress.  Skin: normal turgor, no rashes, warm and dry HEENT: Normocephalic, atraumatic. Pupils equal round and reactive to light; sclera anicteric; extraocular muscles intact;  Nose without nasal septal hypertrophy Mouth/Parynx benign; Mallinpatti scale 3/4 Neck: No JVD, no carotid bruits; normal carotid upstroke Lungs: Decreased breath sounds, no rales Chest wall: without tenderness to palpitation Heart: PMI not displaced, RRR, s1 s2 normal, 1/6 systolic murmur, no diastolic  murmur, no rubs, gallops, thrills, or heaves Abdomen: Marked central adiposity soft, nontender; no hepatosplenomehaly, BS+; abdominal aorta nontender and not dilated by palpation. Back: no CVA tenderness Pulses 2+ Musculoskeletal: full range of motion, normal strength, no joint deformities Extremities: Trivial ankle edema, no clubbing, cyanosis , Homan's sign negative  Neurologic: grossly nonfocal; Cranial nerves grossly wnl Psychologic: Normal mood and affect   Studies/Labs Reviewed:   EKG:  EKG is ordered today.  ECG (independently read by me): Abnormal A-V dissociation with an atrial rate at L5 ventricular rate at 64.  Right bundle branch block morphology.    July 14, 2017 ECG (independently read by me): Probable A-V dissociation with atrial rate approximatel 83 bpm.  Ventricular rate is variable in the 60s to 70 range with right bundle branch block morphology.  Left axis deviation.  Recent Labs: No flowsheet data found.   No flowsheet data found.  No flowsheet data found. No results found for: MCV No results found for: TSH No results found for: HGBA1C   BNP No results found for: BNP  ProBNP No results found for: PROBNP   Lipid Panel  No results found for: CHOL, TRIG, HDL, CHOLHDL, VLDL, LDLCALC, LDLDIRECT   RADIOLOGY: No results found.   Additional studies/ records that were reviewed today include:  I reviewed the office records from Dr. [name]; sleep study, echo Doppler study, event monitor.    ASSESSMENT:    1. Severe obstructive sleep apnea   2. Dyspnea, unspecified type   3. Morbid obesity ([place])   4. Hypertension, unspecified type   5. AV dissociation   6. Type 2 diabetes mellitus with complication, with long-term current use of insulin ([place])   7. Hypothyroidism, unspecified type   8. Psoriasis      PLAN:  [name] is a 77 year old gentleman who has a history of morbid obesity, type 2 diabetes mellitus, hypothyroidism,  hyperlipidemia, psoriasis, who had developed exertional dyspnea and was previously felt to have frequent panic attacks.  Upon further questioning, the patient often wakes up gasping for breath while sleeping and I suspect some of this may be component of severe obstructive sleep apnea.  He has a history of loud snoring, cannot sleep on his back, his sleep is nonrestorative, and he admits to  daytime fatigue.  A sleep study was performed in which she had poor sleep efficiency on the diagnostic portion and could not achieve REM sleep.  There was at least moderately severe sleep apnea noted, but I suspect his sleep apnea is severe if he would've had rems sleep during that portion of the test.  He was titrated up to 18 cm water pressure which was still suboptimal and at this pressure.  He continued to still have oxygen desaturation in rem sleep.  He is scheduled to undergo a BiPAP titration, which I will try to expedite.  I reviewed his 2-D echo Doppler study, which shows low normal systolic function with an EF of 50-55%.  There is evidence for mild aortic stenosis.  His event monitor shows periods of second-degree block with 21 block and on his ECG today, it appears that he still may have underlying A-V dissociation.  He is asymptomatic with ventricular rate at 64.     Shortness of breath has improved on his increased furosemide regimen and I am further titrating losartan to 75 mg daily.  He previously been on metoprolol but this was discontinued.  On recent laboratory.  Lipid studies are excellent with a total cholesterol 119, LDL 61 triglycerides 112, but HDL is low at 35.  He is diabetic on metformin and glimepiride.  He has hypothyroidism and continues to take levothyroxine 175 g.  TSH on 05/07/2017 was 3.6.  I will see him in follow-up of his BiPAP titration after initiation of therapy and further recommendations will be made at that time.  Medication Adjustments/Labs and Tests Ordered: Current medicines are  reviewed at length with the patient today.  Concerns regarding medicines are outlined above.  Medication changes, Labs and Tests ordered today are listed in the Patient Instructions below. Patient Instructions  Medication Instructions:  INCREASE losartan to 75 mg daily  Testing/Procedures: BIPAP titration ASAP-must be approved by insurance prior to scheduling.  Once approved, someone will call to schedule.   Follow-Up: 2-3 months with Dr. Claiborne Billings (sleep clinic)  Any Other Special Instructions Will Be Listed Below (If Applicable).     If you need a refill on your cardiac medications before your next appointment, please call your pharmacy.      Signed, Shelva Majestic, MD  08/18/2017 6:23 PM    [facility] [address] [place], [place], [place]  [address] Phone: [phone]                                         ..    Cardiology Office Note    Date:  08/18/2017   ID:  Akin Yi, DOB May 03, 1941, MRN [id number]  PCP:  [name], MD  Cardiologist:  Shelva Majestic, MD   New cardiology evaluation, referred through the courtesy of Dr. [name] for evaluation of bradycardia and increasing shortness of breath.  History of Present Illness:  [name] is a 77 y.o. male who is referred through the courtesy of Dr. Marisue Humble for evaluation of bradycardia and shortness of breath.  The patient is the first cousin of [name] who recently established cardiology care with me.  [name] has history of hypertension, diabetes mellitus, hyperlipidemia, in addition to hypothyroidism.  He has had a history of presumed anxiety and panic attacks.  He admits to significant dyspnea on exertion and also has noticed episodes of significant  shortness of breath while sleeping.  Upon further questioning, patient omits to loud snoring.  He wakes up gasping for breath and then apparently has a panic attack.  Sleep is nonrestorative.   He admits to daytime sleepiness.  He has never been evaluated for sleep apnea.  He was recently seen by Dr. anger and his ECG was reported to show sinus bradycardia with heart rates in the low 40s.  He denies any presyncope or syncope.  He is not very active.  He has morbid obesity.  He also has a significant history for psoriasis.  Laboratory in December 2018 by Dr.Ehinger had shown mild renal insufficiency with a creatinine of 1.50.  Cholesterol was 119 with an LDL of 64.  He presents for cardiology evaluation  Past Medical History:  Diagnosis Date  . CKD (chronic kidney disease)   . DM type 2 (diabetes mellitus, type 2) ([place])   . ED (erectile dysfunction)   . Fatty liver   . GERD (gastroesophageal reflux disease)   . Gout   . H/O adenomatous polyp of colon   . HLD (hyperlipidemia)   . HTN (hypertension)   . Hypothyroid   . Panic attacks   . Psoriasis     Past medical history is notable for type 2 diabetes mellitus, hyperlipidemia, hypothyroidism, hypertension, obesity, psoriasis, GERD, erectile dysfunction, chronic kidney disease.  Current Medications: Outpatient Medications Prior to Visit  Medication Sig Dispense Refill  . ALPRAZolam (XANAX) 1 MG tablet Take 1 tablet by mouth as directed.    Marland Kitchen aspirin 81 MG EC tablet Take 1 tablet by mouth daily.    Marland Kitchen atorvastatin (LIPITOR) 40 MG tablet Take by mouth daily. Patient takes 20 mg daily    . furosemide (LASIX) 40 MG tablet Take 1 tablet (40 mg total) by mouth daily. 90 tablet 3  . glimepiride (AMARYL) 1 MG tablet Take 1 tablet by mouth daily.    Marland Kitchen levothyroxine (SYNTHROID, LEVOTHROID) 175 MCG tablet Take 1 tablet by mouth daily.    . metFORMIN (GLUCOPHAGE) 1000 MG tablet Take 1 tablet by mouth daily.    Marland Kitchen PARoxetine (PAXIL) 40 MG tablet Take 1 tablet by mouth as directed.    Marland Kitchen losartan (COZAAR) 50 MG tablet Take 1 tablet by mouth daily.     No facility-administered medications prior to visit.      Allergies:   Hctz  [hydrochlorothiazide]; Lexapro [escitalopram oxalate]; and Lisinopril   Social History   Socioeconomic History  . Marital status: Married    Spouse name: Not on file  . Number of children: Not on file  . Years of education: Not on file  . Highest education level: Not on file  Social Needs  . Financial resource strain: Not on file  . Food insecurity - worry: Not on file  . Food insecurity - inability: Not on file  . Transportation needs - medical: Not on file  . Transportation needs - non-medical: Not on file  Occupational History  . Not on file  Tobacco Use  . Smoking status: Never Smoker  . Smokeless tobacco: Never Used  Substance and Sexual Activity  . Alcohol use: No    Alcohol/week: 0.0 oz  . Drug use: No  . Sexual activity: Not on file  Other Topics Concern  . Not on file  Social History Narrative  . Not on file    Additional social history is notable that he is married 75 years.  He has 4 children, 9 grandchildren, and 4 great-grandchildren  and 5 great great-grandchildren.  He completed 2 years of college.  He is retired from [employer]. .  There is no tobacco use.  He does not exercise.  Family History:  Family history is notable that his mother died at age 29.  She never recovered after she broke her hip.  Her father died at age 3 and had emphysema.  One brother died of an MI at age 94.  Another brother died at age 4 and had methicillin-resistant staph infection.  One sister died at age 38 with cancer, another sister had lung cancer, and one sister is living at 64.  Patient's 4 children   ROS General: Negative; No fevers, chills, or night sweats;  HEENT: Negative; No changes in vision or hearing, sinus congestion, difficulty swallowing Pulmonary: Positive for shortness of breath and wheezing Cardiovascular: see HPI GI: Negative; No nausea, vomiting, diarrhea, or abdominal pain GU: Negative; No dysuria, hematuria, or difficulty voiding Musculoskeletal: History of  gout Hematologic/Oncology: Negative; no easy bruising, bleeding Endocrine: Positive for hypothyroidism and type 2 diabetes mellitus Neuro: Negative; no changes in balance, headaches Skin: Negative; No rashes or skin lesions Psychiatric: Negative; No behavioral problems, depression Sleep: Negative; No snoring, daytime sleepiness, hypersomnolence, bruxism, restless legs, hypnogognic hallucinations, no cataplexy Other comprehensive 14 point system review is negative.   PHYSICAL EXAM:   VS:  BP (!) 152/80   Pulse 64   Ht 6' (1.829 m)   Wt (!) 346 lb 12.8 oz (157.3 kg)   BMI 47.03 kg/m     Repeat blood pressure by me 112/80.  Wt Readings from Last 3 Encounters:  08/12/17 (!) 346 lb 12.8 oz (157.3 kg)  08/01/17 (!) 352 lb (159.7 kg)  07/14/17 (!) 354 lb 9.6 oz (160.8 kg)    General: Alert, oriented, no distress.  Skin: Numerous psoriatic lesions HEENT: Normocephalic, atraumatic. Pupils equal round and reactive to light; sclera anicteric; extraocular muscles intact; Fundi no hemorrhages or exudates.  Disks flat Nose without nasal septal hypertrophy Mouth/Parynx benign; Mallinpatti scale 3/4 Neck: No JVD, no carotid bruits; normal carotid upstroke Lungs: Rhonchi and decreased breath sounds bilaterally Chest wall: without tenderness to palpitation Heart: PMI not displaced, RRR, s1 s2 normal, 1/6 systolic murmur, no diastolic murmur, no rubs, gallops, thrills, or heaves Abdomen: Significant central adiposity; soft, nontender; no hepatosplenomehaly, BS+; abdominal aorta nontender and not dilated by palpation. Back: no CVA tenderness Pulses 2+ Musculoskeletal: full range of motion, normal strength, no joint deformities Extremities: Trace ankle edema bilaterally, no clubbing cyanosis, Homan's sign negative  Neurologic: grossly nonfocal; Cranial nerves grossly wnl Psychologic: Normal mood and affect   Studies/Labs Reviewed:   EKG:  EKG is ordered today.  ECG (independently read by me):  Probable A-V dissociation with atrial rate approximatel 83 bpm.  Ventricular rate is variable in the 60s to 70 range with right bundle branch block morphology.  Left axis deviation.  Recent Labs: No flowsheet data found.   No flowsheet data found.  No flowsheet data found. No results found for: MCV No results found for: TSH No results found for: HGBA1C   BNP No results found for: BNP  ProBNP No results found for: PROBNP   Lipid Panel  No results found for: CHOL, TRIG, HDL, CHOLHDL, VLDL, LDLCALC, LDLDIRECT   RADIOLOGY: No results found.   Additional studies/ records that were reviewed today include:  I reviewed the office records from Dr. [name]    ASSESSMENT:    1. Severe obstructive sleep apnea  2. Dyspnea, unspecified type   3. Morbid obesity ([place])   4. Hypertension, unspecified type   5. AV dissociation   6. Type 2 diabetes mellitus with complication, with long-term current use of insulin ([place])   7. Hypothyroidism, unspecified type   8. Psoriasis      PLAN:  Mr. [name] is a 77 year old gentleman who has a history of morbid obesity, type 2 diabetes mellitus, hypothyroidism, hyperlipidemia, psoriasis, who is developed exertional dyspnea.  He also has been labeled as having frequent panic attacks.  Upon further questioning, the patient often wakes up gasping for breath while sleeping and I suspect some of this may be component of severe obstructive sleep apnea.  He has a history of loud snoring, cannot sleep on his back, his sleep is nonrestorative, and he admits to daytime fatigue.  I am recommending he undergo an echo Doppler study to evaluate both systolic and diastolic function as well as valvular architecture.  I reviewed the ECGs from his primary care's office. On one ECG there was suggestion of sinus bradycardia.  However, on the other ECG there was suggestion of possible A-V dissociation.  His ECG done today in the office today shows atrial rate  at approximately 83 and a ventricular rate at 61 with right bundle branch morphology to the ventricular beat.  He has been on a medical regimen consisting of furosemide 20 mg, losartan 50 [place] grams daily for blood pressure.  I am recommending further titration of furosemide 40 mg daily, which will also improve his shortness of breath.  I suspect he may very well have diastolic dysfunction.  He was previously on metoprolol but this was stopped 2-3 days ago, which may still be present in his system to account for some of his bradycardia arrhythmia and possible A-V dissociation.  I suspect he has obstructive sleep apnea which may also be contributing to nocturnal bradycardia arrhythmia.  I am scheduling him for a sleep study for further evaluation.  I will also schedule him for a cardiac monitor to evaluate his cardiac rhythm once metoprolol has been weaned out of his system.  I will see him in the office in 3-4 weeks for reevaluation.  Medication Adjustments/Labs and Tests Ordered: Current medicines are reviewed at length with the patient today.  Concerns regarding medicines are outlined above.  Medication changes, Labs and Tests ordered today are listed in the Patient Instructions below. Patient Instructions  Medication Instructions:  INCREASE losartan to 75 mg daily  Testing/Procedures: BIPAP titration ASAP-must be approved by insurance prior to scheduling.  Once approved, someone will call to schedule.   Follow-Up: 2-3 months with Dr. Claiborne Billings (sleep clinic)  Any Other Special Instructions Will Be Listed Below (If Applicable).     If you need a refill on your cardiac medications before your next appointment, please call your pharmacy.      Signed, Shelva Majestic, MD  08/18/2017 6:23 PM    [facility] [address], [place], [place], [place]  [address] Phone: [phone]

## 2017-08-14 ENCOUNTER — Telehealth: Payer: Self-pay | Admitting: *Deleted

## 2017-08-14 ENCOUNTER — Other Ambulatory Visit: Payer: Self-pay | Admitting: *Deleted

## 2017-08-14 MED ORDER — LOSARTAN POTASSIUM 50 MG PO TABS: 75.0000 mg | ORAL_TABLET | Freq: Every day | ORAL | 3 refills | Status: DC

## 2017-08-14 NOTE — Telephone Encounter (Signed)
Patient notified of BIPAP titration appointment.

## 2017-08-14 NOTE — Progress Notes (Signed)
Patient notified of BIPAP titration appointment.

## 2017-08-15 ENCOUNTER — Telehealth: Payer: Self-pay | Admitting: Cardiovascular Disease

## 2017-08-15 MED ORDER — LOSARTAN POTASSIUM 50 MG PO TABS: 75.0000 mg | ORAL_TABLET | Freq: Every day | ORAL | 3 refills | Status: DC

## 2017-08-15 NOTE — Telephone Encounter (Signed)
Returned call to wife who states pharmacy does not have new losartan rx but requesting this be changed and sent to [facility] mail delivery.     New rx sent to [facility].  Patient and wife aware.

## 2017-08-15 NOTE — Telephone Encounter (Signed)
Pt's wife want to know what pharmacy was the pt's Losartan sent to. Please call pt's wife

## 2017-08-18 ENCOUNTER — Encounter: Payer: Self-pay | Admitting: Cardiovascular Disease

## 2017-08-27 ENCOUNTER — Ambulatory Visit (HOSPITAL_BASED_OUTPATIENT_CLINIC_OR_DEPARTMENT_OTHER): Payer: Medicare HMO | Attending: Cardiovascular Disease | Admitting: Cardiovascular Disease

## 2017-08-27 DIAGNOSIS — G4733 Obstructive sleep apnea (adult) (pediatric): Secondary | ICD-10-CM | POA: Diagnosis not present

## 2017-09-15 ENCOUNTER — Telehealth: Payer: Self-pay | Admitting: *Deleted

## 2017-09-15 ENCOUNTER — Encounter (HOSPITAL_BASED_OUTPATIENT_CLINIC_OR_DEPARTMENT_OTHER): Payer: Self-pay | Admitting: Cardiovascular Disease

## 2017-09-15 NOTE — Procedures (Signed)
Patient Name: [name], [name] Date: 08/27/2017 Gender: Male D.O.B: Mar 04, 1941 Age (years): 21 Referring Provider: Shelva Majestic MD, ABSM Height (inches): 72 Interpreting Physician: Shelva Majestic MD, ABSM Weight (lbs): 352 RPSGT: Lanae Boast BMI: 48 MRN: [id number] Neck Size: 21.00  CLINICAL INFORMATION The patient is referred for a BiPAP titration to treat sleep apnea.  Date of Split Night: 08/01/2017: AHI 24.6/h; RDI 25.1/h; absence of REM sleep; .  Oxygen nadir to 74%; failed CPAP titration.  SLEEP STUDY TECHNIQUE As per the AASM Manual for the Scoring of Sleep and Associated Events v2.3 (April 2016) with a hypopnea requiring 4% desaturations.  The channels recorded and monitored were frontal, central and occipital EEG, electrooculogram (EOG), submentalis EMG (chin), nasal and oral airflow, thoracic and abdominal wall motion, anterior tibialis EMG, snore microphone, electrocardiogram, and pulse oximetry. Bilevel positive airway pressure (BPAP) was initiated at the beginning of the study and titrated to treat sleep-disordered breathing.  MEDICATIONS     ALPRAZolam (XANAX) 1 MG tablet         aspirin 81 MG EC tablet         atorvastatin (LIPITOR) 40 MG tablet         furosemide (LASIX) 40 MG tablet         glimepiride (AMARYL) 1 MG tablet         levothyroxine (SYNTHROID, LEVOTHROID) 175 MCG tablet         losartan (COZAAR) 50 MG tablet         metFORMIN (GLUCOPHAGE) 1000 MG tablet         PARoxetine (PAXIL) 40 MG tablet      Medications self-administered by patient taken the night of the study : N/A  RESPIRATORY PARAMETERS Optimal IPAP Pressure (cm): 13 AHI at Optimal Pressure (/hr) 0 Optimal EPAP Pressure (cm): 9   Overall Minimal O2 (%): 83.0 Minimal O2 at Optimal Pressure (%): 86.0  SLEEP ARCHITECTURE Start Time: 11:11:07 PM Stop Time: 5:15:10 AM Total Time (min): 364.0 Total Sleep Time (min): 247.2 Sleep Latency (min): 43.4 Sleep Efficiency  (%): 67.9% REM Latency (min): 245.0 WASO (min): 73.5 Stage N1 (%): 15.0% Stage N2 (%): 80.6% Stage N3 (%): 0.0% Stage R (%): 4.45 Supine (%): 66.36 Arousal Index (/hr): 9.0   CARDIAC DATA The 2 lead EKG demonstrated sinus rhythm. The mean heart rate was 49.5 beats per minute. Other EKG findings include: PVCs.  LEG MOVEMENT DATA The total Periodic Limb Movements of Sleep (PLMS) were 0. The PLMS index was 0.0. A PLMS index of <15 is considered normal in adults.  IMPRESSIONS - The patient was titrated to a BiPAP pressure of 13/9; AHI 0 and oxygen nadir 92%; however, the patient did not achieve REM sleep at this pressure. - Central sleep apnea was not noted during this titration (CAI = 0.0/h). - Moderete oxygen desaturations to a nadir of 83% at 8/4. - The patient snored with moderate snoring volume. - 2-lead EKG demonstrated: PVCs - Clinically significant periodic limb movements were not noted during this study.   DIAGNOSIS - Obstructive Sleep Apnea (327.23 [G47.33 ICD-10]) -  RECOMMENDATIONS - Recommend an initial trial of BiPAP Auto therapy with and EPAP min of 9, PS of 4  and IPAP max of 25 with heated humidification.  A Medium size Resmed Full Face Mask AirFit F30 mask was used for the titration. - Efforts should be made to optimize nasal and oral pharyngeal patency. - Avoid alcohol, sedatives and other CNS depressants that may worsen sleep  apnea and disrupt normal sleep architecture. - Sleep hygiene should be reviewed to assess factors that may improve sleep quality. - Weight management and regular exercise should be initiated or continued. - Recommend a download be obtained in 30 days and follow-up sleep clinic evaluation after 4 weeks of therapy.  [Electronically signed] 09/15/2017 08:24 AM  Shelva Majestic MD, [facility], [place], American Board of Sleep Medicine   NPI: [id number] [place] PH: [phone]   FX: [phone] [place]

## 2017-09-15 NOTE — Telephone Encounter (Signed)
-----   Message from Troy Sine, MD sent at 09/15/2017  8:28 AM EDT ----- [name], please set up with DME company to initiate BiPAP at home with follow-up sleep evaluation

## 2017-09-15 NOTE — Telephone Encounter (Signed)
Patient informed referral for his BIPAP therapy has been sent to [facility]. Once they have gotten benefits approved from his insurance company they will contact him to get set up. Patient also made aware this can take 1-2 weeks. He voiced his understanding and thanked me for the phone call.

## 2017-09-16 ENCOUNTER — Telehealth: Payer: Self-pay | Admitting: *Deleted

## 2017-09-16 NOTE — Telephone Encounter (Signed)
Received a call from [facility]. She informed me that patient doesn't have any out of network benefits on his insurance plan therefore she will be forwarding his BIPAP referral to [facility].

## 2017-09-30 DIAGNOSIS — F411 Generalized anxiety disorder: Secondary | ICD-10-CM | POA: Diagnosis not present

## 2017-09-30 DIAGNOSIS — C44319 Basal cell carcinoma of skin of other parts of face: Secondary | ICD-10-CM | POA: Diagnosis not present

## 2017-10-01 DIAGNOSIS — G4733 Obstructive sleep apnea (adult) (pediatric): Secondary | ICD-10-CM | POA: Diagnosis not present

## 2017-10-30 ENCOUNTER — Telehealth: Payer: Self-pay | Admitting: Cardiovascular Disease

## 2017-10-30 NOTE — Telephone Encounter (Signed)
Returned a call to patient. He states that he cannot go to sleep at night. [facility] told him they will have to pick up his BIBPAP machine due to non compliance. Patient told them to pick it up. Then he decided that he wants to continue to try using it. He states that he will lay in bed for 4 hours with the mask on and not go to sleep. Once he takes it off he falls asleep. I spoke with Jackelyn Poling D. @ [facility] and had her to cancel the pick up order. Patient states that he takes 1 mg of xanax nightly but it does not help him to go to sleep. Patient wants Dr Claiborne Billings to give RX so he can go to sleep. At this time patient is not using the BIPAP machine enough to meet compliance due to not being able to sleep. Message will be routed to Dr Claiborne Billings for review and recommendations.

## 2017-10-30 NOTE — Telephone Encounter (Signed)
Pt's wife calling    Wife said  Insurance company need authorization from Dr Claiborne Billings saying pt need more time with his sleep machine due to his panic attacks and anxiety. [employer] R[employer] is his insurance.

## 2017-10-31 ENCOUNTER — Other Ambulatory Visit: Payer: Self-pay | Admitting: Cardiovascular Disease

## 2017-10-31 MED ORDER — ZOLPIDEM TARTRATE 5 MG PO TABS: 5.0000 mg | ORAL_TABLET | Freq: Every day | ORAL | 2 refills | Status: DC

## 2017-10-31 NOTE — Telephone Encounter (Signed)
Zolpidem 5 mg called to [facility] per VO from Dr Claiborne Billings.

## 2017-10-31 NOTE — Telephone Encounter (Signed)
Patient informed Zolpidem RX will be phoned in to [facility].

## 2017-10-31 NOTE — Telephone Encounter (Signed)
Ok to try zolpidem 5 mg hs to see if helps.

## 2017-11-01 DIAGNOSIS — G4733 Obstructive sleep apnea (adult) (pediatric): Secondary | ICD-10-CM | POA: Diagnosis not present

## 2017-11-05 ENCOUNTER — Telehealth: Payer: Self-pay | Admitting: *Deleted

## 2017-11-05 NOTE — Telephone Encounter (Signed)
PA submitted via Covermymeds for Ambien 5mg 

## 2017-11-07 NOTE — Telephone Encounter (Signed)
Ok to try belsomra 10 mg

## 2017-11-07 NOTE — Telephone Encounter (Signed)
Received notification from [facility], zolpidem 5 mg has been denied.  "The drug you have asked for is considered a high risk medication because it can cause increased harm for elderly patients (65 and older).   To qualify for coverage, you must meet the following criteria: you have been unable to achieve adequate symptom control on previous treatment, have a contraindication, or intolerance with two of the following appropriate alternatives for specified diagnosis: trazodone or Belsomra OR is unable to taper the medication".     Routed to MD and sleep coordinator to make aware.

## 2017-11-10 ENCOUNTER — Other Ambulatory Visit: Payer: Self-pay | Admitting: *Deleted

## 2017-11-11 DIAGNOSIS — E7800 Pure hypercholesterolemia, unspecified: Secondary | ICD-10-CM | POA: Diagnosis not present

## 2017-11-11 DIAGNOSIS — E1129 Type 2 diabetes mellitus with other diabetic kidney complication: Secondary | ICD-10-CM | POA: Diagnosis not present

## 2017-11-11 DIAGNOSIS — F411 Generalized anxiety disorder: Secondary | ICD-10-CM | POA: Diagnosis not present

## 2017-11-11 DIAGNOSIS — E039 Hypothyroidism, unspecified: Secondary | ICD-10-CM | POA: Diagnosis not present

## 2017-11-11 DIAGNOSIS — R5383 Other fatigue: Secondary | ICD-10-CM | POA: Diagnosis not present

## 2017-11-11 DIAGNOSIS — N400 Enlarged prostate without lower urinary tract symptoms: Secondary | ICD-10-CM | POA: Diagnosis not present

## 2017-11-11 DIAGNOSIS — I129 Hypertensive chronic kidney disease with stage 1 through stage 4 chronic kidney disease, or unspecified chronic kidney disease: Secondary | ICD-10-CM | POA: Diagnosis not present

## 2017-11-11 DIAGNOSIS — N183 Chronic kidney disease, stage 3 (moderate): Secondary | ICD-10-CM | POA: Diagnosis not present

## 2017-11-11 DIAGNOSIS — F39 Unspecified mood [affective] disorder: Secondary | ICD-10-CM | POA: Diagnosis not present

## 2017-11-11 NOTE — Telephone Encounter (Signed)
OV 6/12 with Dr. Claiborne Billings

## 2017-11-12 ENCOUNTER — Ambulatory Visit: Payer: Medicare HMO | Admitting: Cardiovascular Disease

## 2017-11-12 ENCOUNTER — Encounter: Payer: Self-pay | Admitting: Cardiovascular Disease

## 2017-11-12 VITALS — BP 98/72 | HR 38 | Ht 72.0 in | Wt 346.0 lb

## 2017-11-12 DIAGNOSIS — I4589 Other specified conduction disorders: Secondary | ICD-10-CM | POA: Diagnosis not present

## 2017-11-12 DIAGNOSIS — I952 Hypotension due to drugs: Secondary | ICD-10-CM

## 2017-11-12 DIAGNOSIS — G4733 Obstructive sleep apnea (adult) (pediatric): Secondary | ICD-10-CM

## 2017-11-12 DIAGNOSIS — R0609 Other forms of dyspnea: Secondary | ICD-10-CM

## 2017-11-12 DIAGNOSIS — I350 Nonrheumatic aortic (valve) stenosis: Secondary | ICD-10-CM

## 2017-11-12 DIAGNOSIS — Z6841 Body Mass Index (BMI) 40.0 and over, adult: Secondary | ICD-10-CM

## 2017-11-12 DIAGNOSIS — R0683 Snoring: Secondary | ICD-10-CM

## 2017-11-12 MED ORDER — LOSARTAN POTASSIUM 50 MG PO TABS: 50.0000 mg | ORAL_TABLET | Freq: Every day | ORAL | 3 refills | Status: DC

## 2017-11-12 NOTE — Patient Instructions (Addendum)
Medication Instructions:  DECREASE Losartan to 50 mg daily  Follow-Up: You have been referred to Cardiac Electrophysiologist at our [facility] (ASAP)  1 month with Dr. Claiborne Billings    If you need a refill on your cardiac medications before your next appointment, please call your pharmacy.

## 2017-11-12 NOTE — Progress Notes (Signed)
Cardiology Office Note    Date:  11/12/2017   ID:  [name], DOB [birth year], MRN [id number]  PCP:  Mike Arabian, MD  Cardiologist:  Mike Majestic, MD   F/u cardiology evaluation  History of Present Illness:  [name] is a 77 y.o. male who wasreferred through the courtesy of Dr. Marisue Green for evaluation of bradycardia and shortness of breath.  The patient is the first cousin of [name] who recently established cardiology care with me.  I initially saw him on July 14, 2017.  He presents for one-month follow-up evaluation.  Mr. [name] has history of hypertension, diabetes mellitus, hyperlipidemia, in addition to hypothyroidism.  He has had a history of presumed anxiety and panic attacks.  He admits to significant dyspnea on exertion and also has noticed episodes of significant shortness of breath while sleeping.  Upon further questioning, patient omits to loud snoring.  He wakes up gasping for breath and then apparently has a panic attack.  Sleep is nonrestorative.  He admits to daytime sleepiness.  He has never been evaluated for sleep apnea.  He was recently seen by [name] and his ECG was reported to show sinus bradycardia with heart rates in the low 40s.  He denies any presyncope or syncope.  He is not very active.  He has morbid obesity.  He also has a significant history for psoriasis.  Laboratory in December 2018 by MikeEhinger had shown mild renal insufficiency with a creatinine of 1.50.  Cholesterol was 119 with an LDL of 64.  H  When I initially saw him, his ECG suggested probable A-V dissociation with an atrial rate at approximately 83 bpm variable ventricular rate in the 60s to 70s with right bundle branch block morphology and left axis deviation.  He was continuing to have shortness of breath and I further titrated furosemide to 40 mg.  His metoprolol had been stopped prior to his office visit with me.  I was very concerned that he most likely had obstructive sleep apnea  which could also contribute to nocturnal bradycardic arrhythmias.  I scheduled him for a sleep study which revealed at least moderate sleep apnea with an AHI of 24.6 on the diagnostic portion of the study; however, the patient was unable to achieve any rim sleep during this segment of the test.  CPAP was initiated and titrated up to 18 cm water pressure.  Despite this pressure.  He continued have significant oxygen desaturation to a nadir of 77%.  I had recommended BiPAP titration giving his suboptimal CPAP titration evaluation but this has not yet been done.  He also wore a monitor which showed periods of time.  His rhythm with first-degree AV block, and PACs, second-degree AV block, and periods of 2-1 AV block, and apparent A-V dissociation.  The patient was asymptomatic.  A 2-D echo Doppler study on for brief 14 2019 showed an EF of 50-55%.  There was a thickened aortic valve with mild aortic stenosis with a peak gradient of 21 and mean gradient of 12.  There was mitral annular calcification.  There was moderate biatrial enlargement.  Acoustical window was poor.  He presents for reevaluation.  He denies any chest pain.  He is breathing better.  He denies presyncope or syncope.  Past Medical History:  Diagnosis Date  . CKD (chronic kidney disease)   . DM type 2 (diabetes mellitus, type 2) ([place])   . ED (erectile dysfunction)   . Fatty liver   . GERD (  gastroesophageal reflux disease)   . Gout   . H/O adenomatous polyp of colon   . HLD (hyperlipidemia)   . HTN (hypertension)   . Hypothyroid   . Panic attacks   . Psoriasis     Past medical history is notable for type 2 diabetes mellitus, hyperlipidemia, hypothyroidism, hypertension, obesity, psoriasis, GERD, erectile dysfunction, chronic kidney disease.  Current Medications: Outpatient Medications Prior to Visit  Medication Sig Dispense Refill  . ALPRAZolam (XANAX) 1 MG tablet Take 1 tablet by mouth as directed.    Marland Kitchen aspirin 81 MG EC tablet  Take 1 tablet by mouth daily.    Marland Kitchen atorvastatin (LIPITOR) 40 MG tablet Take by mouth daily. Patient takes 20 mg daily    . furosemide (LASIX) 40 MG tablet Take 1 tablet (40 mg total) by mouth daily. 90 tablet 3  . glimepiride (AMARYL) 1 MG tablet Take 1 tablet by mouth daily.    Marland Kitchen levothyroxine (SYNTHROID, LEVOTHROID) 175 MCG tablet Take 1 tablet by mouth daily.    . metFORMIN (GLUCOPHAGE) 1000 MG tablet Take 1,000 mg by mouth 2 (two) times daily with a meal.     . sertraline (ZOLOFT) 100 MG tablet Take 100 mg by mouth daily.    Marland Kitchen losartan (COZAAR) 50 MG tablet Take 1.5 tablets (75 mg total) by mouth daily. 135 tablet 3  . PARoxetine (PAXIL) 40 MG tablet Take 1 tablet by mouth as directed.    . zolpidem (AMBIEN) 5 MG tablet Take 1 tablet (5 mg total) by mouth at bedtime. 30 tablet 2   No facility-administered medications prior to visit.      Allergies:   Hctz [hydrochlorothiazide]; Lexapro [escitalopram oxalate]; and Lisinopril   Social History   Socioeconomic History  . Marital status: Married    Spouse name: Not on file  . Number of children: Not on file  . Years of education: Not on file  . Highest education level: Not on file  Occupational History  . Not on file  Social Needs  . Financial resource strain: Not on file  . Food insecurity:    Worry: Not on file    Inability: Not on file  . Transportation needs:    Medical: Not on file    Non-medical: Not on file  Tobacco Use  . Smoking status: Never Smoker  . Smokeless tobacco: Never Used  Substance and Sexual Activity  . Alcohol use: No    Alcohol/week: 0.0 oz  . Drug use: No  . Sexual activity: Not on file  Lifestyle  . Physical activity:    Days per week: Not on file    Minutes per session: Not on file  . Stress: Not on file  Relationships  . Social connections:    Talks on phone: Not on file    Gets together: Not on file    Attends religious service: Not on file    Active member of club or organization: Not on  file    Attends meetings of clubs or organizations: Not on file    Relationship status: Not on file  Other Topics Concern  . Not on file  Social History Narrative  . Not on file    Additional social history is notable that he is married 62 years.  He has 4 children, 9 grandchildren, and 4 great-grandchildren and 5 great great-grandchildren.  He completed 2 years of college.  He is retired from [employer]. .  There is no tobacco use.  He does not exercise.  Family History:  Family history is notable that his mother died at age 41.  She never recovered after she broke her hip.  Her father died at age 45 and had emphysema.  One brother died of an MI at age 17.  Another brother died at age 33 and had methicillin-resistant staph infection.  One sister died at age 40 with cancer, another sister had lung cancer, and one sister is living at 6.  Patient's 4 children   ROS General: Negative; No fevers, chills, or night sweats;  HEENT: Negative; No changes in vision or hearing, sinus congestion, difficulty swallowing Pulmonary: Positive for shortness of breath and wheezing Cardiovascular: see HPI GI: Negative; No nausea, vomiting, diarrhea, or abdominal pain GU: Negative; No dysuria, hematuria, or difficulty voiding Musculoskeletal: History of gout Hematologic/Oncology: Negative; no easy bruising, bleeding Endocrine: Positive for hypothyroidism and type 2 diabetes mellitus Neuro: Negative; no changes in balance, headaches Skin: Negative; No rashes or skin lesions Psychiatric: Positive for anxiety Sleep: Positive for snoring, daytime sleepiness, no bruxism, restless legs, hypnogognic hallucinations, no cataplexy Other comprehensive 14 point system review is negative.   PHYSICAL EXAM:   VS:  BP 98/72 (BP Location: Left Arm, Patient Position: Sitting, Cuff Size: Large)   Pulse (!) 38   Ht 6' (1.829 m)   Wt (!) 346 lb (156.9 kg)   BMI 46.93 kg/m     Repeat blood pressure by me 122/78  Wt  Readings from Last 3 Encounters:  11/12/17 (!) 346 lb (156.9 kg)  08/12/17 (!) 346 lb 12.8 oz (157.3 kg)  08/01/17 (!) 352 lb (159.7 kg)    General: Alert, oriented, no distress.  Skin: normal turgor, no rashes, warm and dry HEENT: Normocephalic, atraumatic. Pupils equal round and reactive to light; sclera anicteric; extraocular muscles intact;  Nose without nasal septal hypertrophy Mouth/Parynx benign; Mallinpatti scale 3/4 Neck: No JVD, no carotid bruits; normal carotid upstroke Lungs: Decreased breath sounds, no rales Chest wall: without tenderness to palpitation Heart: PMI not displaced, RRR, s1 s2 normal, 1/6 systolic murmur, no diastolic murmur, no rubs, gallops, thrills, or heaves Abdomen: Marked central adiposity soft, nontender; no hepatosplenomehaly, BS+; abdominal aorta nontender and not dilated by palpation. Back: no CVA tenderness Pulses 2+ Musculoskeletal: full range of motion, normal strength, no joint deformities Extremities: Trivial ankle edema, no clubbing, cyanosis , Homan's sign negative  Neurologic: grossly nonfocal; Cranial nerves grossly wnl Psychologic: Normal mood and affect   Studies/Labs Reviewed:   EKG:  EKG is ordered today.  ECG (independently read by me): Abnormal A-V dissociation with an atrial rate at L5 ventricular rate at 64.  Right bundle branch block morphology.    July 14, 2017 ECG (independently read by me): Probable A-V dissociation with atrial rate approximatel 83 bpm.  Ventricular rate is variable in the 60s to 70 range with right bundle branch block morphology.  Left axis deviation.  Recent Labs: No flowsheet data found.   No flowsheet data found.  No flowsheet data found. No results found for: MCV No results found for: TSH No results found for: HGBA1C   BNP No results found for: BNP  ProBNP No results found for: PROBNP   Lipid Panel  No results found for: CHOL, TRIG, HDL, CHOLHDL, VLDL, LDLCALC,  LDLDIRECT   RADIOLOGY: No results found.   Additional studies/ records that were reviewed today include:  I reviewed the office records from Dr. Gaynelle Green; sleep study, echo Doppler study, event monitor.    ASSESSMENT:  1. Severe obstructive sleep apnea   2. AV dissociation   3. Dyspnea on exertion   4. Class 3 severe obesity with body mass index (BMI) of 45.0 to 49.9 in adult, unspecified obesity type, unspecified whether serious comorbidity present ([place])   5. Snoring   6. Morbid obesity ([place])   7. Hypotension due to drugs   8. Mild aortic stenosis      PLAN:  Mr. Alexander Aument is a 77 year old gentleman who has a history of morbid obesity, type 2 diabetes mellitus, hypothyroidism, hyperlipidemia, psoriasis, who had developed exertional dyspnea and was previously felt to have frequent panic attacks.  Upon further questioning, the patient often wakes up gasping for breath while sleeping and I suspect some of this may be component of severe obstructive sleep apnea.  He has a history of loud snoring, cannot sleep on his back, his sleep is nonrestorative, and he admits to daytime fatigue.  A sleep study was performed in which she had poor sleep efficiency on the diagnostic portion and could not achieve REM sleep.  There was at least moderately severe sleep apnea noted, but I suspect his sleep apnea is severe if he would've had rems sleep during that portion of the test.  He was titrated up to 18 cm water pressure which was still suboptimal and at this pressure.  He continued to still have oxygen desaturation in rem sleep.  He is scheduled to undergo a BiPAP titration, which I will try to expedite.  I reviewed his 2-D echo Doppler study, which shows low normal systolic function with an EF of 50-55%.  There is evidence for mild aortic stenosis.  His event monitor shows periods of second-degree block with 21 block and on his ECG today, it appears that he still may have underlying A-V  dissociation.  He is asymptomatic with ventricular rate at 64.     Shortness of breath has improved on his increased furosemide regimen and I am further titrating losartan to 75 mg daily.  He previously been on metoprolol but this was discontinued.  On recent laboratory.  Lipid studies are excellent with a total cholesterol 119, LDL 61 triglycerides 112, but HDL is low at 35.  He is diabetic on metformin and glimepiride.  He has hypothyroidism and continues to take levothyroxine 175 g.  TSH on 05/07/2017 was 3.6.  I will see him in follow-up of his BiPAP titration after initiation of therapy and further recommendations will be made at that time.  Medication Adjustments/Labs and Tests Ordered: Current medicines are reviewed at length with the patient today.  Concerns regarding medicines are outlined above.  Medication changes, Labs and Tests ordered today are listed in the Patient Instructions below. Patient Instructions  Medication Instructions:  DECREASE Losartan to 50 mg daily  Follow-Up: You have been referred to Cardiac Electrophysiologist at our [facility] ([facility])  1 month with Dr. Claiborne Billings    If you need a refill on your cardiac medications before your next appointment, please call your pharmacy.       Signed, Mike Majestic, MD  11/12/2017 2:22 PM    [facility] [address], [place], [place], [place]  [address] Phone: [phone]                                         ..    Cardiology Office Note  Date:  11/12/2017   ID:  [name], DOB 1940-08-03, MRN [id number]  PCP:  Mike Arabian, MD  Cardiologist:  Mike Majestic, MD   F/U cardiology evaluation, referred through the courtesy of Dr. Gaynelle Green for evaluation of bradycardia and increasing shortness of breath.    History of Present Illness:  [name] is a 77 y.o. male who was referred through the courtesy of Dr. Marisue Green for evaluation of  bradycardia and shortness of breath.  The patient is the first cousin of [name] who had recently established cardiology care with me. I saw him for initial evaluation on July 14, 2017.  He presents now for follow-up evaluation  Mr. Kluver has history of hypertension, diabetes mellitus, hyperlipidemia, in addition to hypothyroidism.  He has had a history of presumed anxiety and panic attacks.  He admits to significant dyspnea on exertion and also has noticed episodes of significant shortness of breath while sleeping.  Upon further questioning, patient omits to loud snoring.  He wakes up gasping for breath and then apparently has a panic attack.  Sleep is nonrestorative.  He admits to daytime sleepiness.  He has never been evaluated for sleep apnea.  He was recently seen by Dr. Marisue Green and his ECG was reported to show sinus bradycardia with heart rates in the low 40s.  He denies any presyncope or syncope.  He is not very active.  He has morbid obesity.  He also has a significant history for psoriasis.  Laboratory in December 2018 by MikeEhinger had shown mild renal insufficiency with a creatinine of 1.50.  Cholesterol was 119 with an LDL of 64.   When I initially saw him, ECG showed probable A-V dissociation with an atrial rate at approximately 83.  His ventricular rate was variable in the 60s to 70s with right bundle branch block morphology.  There was left axis deviation.  He had previously been on metoprolol but this had been stopped 2 days previously.  I was highly suspect of obstructive sleep apnea health this potentially was contributing to nocturnal bradycardia 8 cardiac arrhythmia.  I scheduled him for a sleep study, and echo doppler study as well as a cardiac monitor.  55%.  His aortic valve was thickened.  There was mild aortic stenosis with a mean gradient of 12 and peak instantaneous gradient of 21 mm.  There was mitral annular calcification with mild leaflet thickening.  He had moderate  biatrial enlargement.  Acoustical window was suboptimal.  A sleep study revealed moderate overall sleep apnea with an AHI of 24.6/h and on the diagnostic portion of the split-night titration he was unable to achieve anyREM sleep.  There was severe oxygen desaturation to a nadir of 74%.  There was moderate snoring.  CPAP was initiated but was suboptimal and BiPAP titration was recommended. underwent BiPAP titration on August 27, 2017 and although he was titrated up to a pressure of 13/9 he was unable to achieve any rem sleep.  An initial trial of BiPAP auto therapy was recommended.  Patient has not been compliant with initiation of therapy.  A download was obtained in the office today from Oct 12, 2017 through November 10, 2017.  He is not compliant with only 47% of usage days and average usage only 1 hour 32 minutes.  However when used, AHI was significantly improved at 0.8.  A 2-week monitor revealed baseline rhythm with sinus rhythm with first-degree AV block.  He had episodes of bigeminal PACs.  There were also periods  of second degree type I block, 2-1 AV block, sinus arrhythmia with first-degree AV block, and periods of possible A-V dissociation.  He has a right bundle branch block pattern to his QRS morphology.   He saw Dr. Marisue Green yesterday.  His heart rate was noted to be in the upper 30s.  The patient inadvertently had taken his wife's carvedilol today by mistake.  Tells me his blood pressure yesterday at his primary care doctor's office was 140/90.  He admits to occasional episodes of dizziness.  He denies any frank syncope.  He presents for evaluation.  Past Medical History:  Diagnosis Date  . CKD (chronic kidney disease)   . DM type 2 (diabetes mellitus, type 2) ([place])   . ED (erectile dysfunction)   . Fatty liver   . GERD (gastroesophageal reflux disease)   . Gout   . H/O adenomatous polyp of colon   . HLD (hyperlipidemia)   . HTN (hypertension)   . Hypothyroid   . Panic attacks   . Psoriasis      Past medical history is notable for type 2 diabetes mellitus, hyperlipidemia, hypothyroidism, hypertension, obesity, psoriasis, GERD, erectile dysfunction, chronic kidney disease.  Current Medications: Outpatient Medications Prior to Visit  Medication Sig Dispense Refill  . ALPRAZolam (XANAX) 1 MG tablet Take 1 tablet by mouth as directed.    Marland Kitchen aspirin 81 MG EC tablet Take 1 tablet by mouth daily.    Marland Kitchen atorvastatin (LIPITOR) 40 MG tablet Take by mouth daily. Patient takes 20 mg daily    . furosemide (LASIX) 40 MG tablet Take 1 tablet (40 mg total) by mouth daily. 90 tablet 3  . glimepiride (AMARYL) 1 MG tablet Take 1 tablet by mouth daily.    Marland Kitchen levothyroxine (SYNTHROID, LEVOTHROID) 175 MCG tablet Take 1 tablet by mouth daily.    . metFORMIN (GLUCOPHAGE) 1000 MG tablet Take 1,000 mg by mouth 2 (two) times daily with a meal.     . sertraline (ZOLOFT) 100 MG tablet Take 100 mg by mouth daily.    Marland Kitchen losartan (COZAAR) 50 MG tablet Take 1.5 tablets (75 mg total) by mouth daily. 135 tablet 3  . PARoxetine (PAXIL) 40 MG tablet Take 1 tablet by mouth as directed.    . zolpidem (AMBIEN) 5 MG tablet Take 1 tablet (5 mg total) by mouth at bedtime. 30 tablet 2   No facility-administered medications prior to visit.      Allergies:   Hctz [hydrochlorothiazide]; Lexapro [escitalopram oxalate]; and Lisinopril   Social History   Socioeconomic History  . Marital status: Married    Spouse name: Not on file  . Number of children: Not on file  . Years of education: Not on file  . Highest education level: Not on file  Occupational History  . Not on file  Social Needs  . Financial resource strain: Not on file  . Food insecurity:    Worry: Not on file    Inability: Not on file  . Transportation needs:    Medical: Not on file    Non-medical: Not on file  Tobacco Use  . Smoking status: Never Smoker  . Smokeless tobacco: Never Used  Substance and Sexual Activity  . Alcohol use: No     Alcohol/week: 0.0 oz  . Drug use: No  . Sexual activity: Not on file  Lifestyle  . Physical activity:    Days per week: Not on file    Minutes per session: Not on file  . Stress:  Not on file  Relationships  . Social connections:    Talks on phone: Not on file    Gets together: Not on file    Attends religious service: Not on file    Active member of club or organization: Not on file    Attends meetings of clubs or organizations: Not on file    Relationship status: Not on file  Other Topics Concern  . Not on file  Social History Narrative  . Not on file    Additional social history is notable that he is married 46 years.  He has 4 children, 9 grandchildren, and 4 great-grandchildren and 5 great great-grandchildren.  He completed 2 years of college.  He is retired from [employer]. .  There is no tobacco use.  He does not exercise.  Family History:  Family history is notable that his mother died at age 36.  She never recovered after she broke her hip.  Her father died at age 15 and had emphysema.  One brother died of an MI at age 63.  Another brother died at age 46 and had methicillin-resistant staph infection.  One sister died at age 62 with cancer, another sister had lung cancer, and one sister is living at 28.  Patient's 4 children   ROS General: Negative; No fevers, chills, or night sweats;  HEENT: Negative; No changes in vision or hearing, sinus congestion, difficulty swallowing Pulmonary: Positive for shortness of breath and wheezing Cardiovascular: see HPI GI: Negative; No nausea, vomiting, diarrhea, or abdominal pain GU: Negative; No dysuria, hematuria, or difficulty voiding Musculoskeletal: History of gout Hematologic/Oncology: Negative; no easy bruising, bleeding Endocrine: Positive for hypothyroidism and type 2 diabetes mellitus Neuro: Negative; no changes in balance, headaches Skin: Negative; No rashes or skin lesions Psychiatric: Negative; No behavioral problems,  depression Sleep: Positive for obstructive sleep apnea with fatigability and excessive daytime sleepiness hypersomnolence, no bruxism, restless legs, hypnogognic hallucinations, no cataplexy Other comprehensive 14 point system review is negative.   PHYSICAL EXAM:   VS:  BP 98/72 (BP Location: Left Arm, Patient Position: Sitting, Cuff Size: Large)   Pulse (!) 38   Ht 6' (1.829 m)   Wt (!) 346 lb (156.9 kg)   BMI 46.93 kg/m     Repeat blood pressure by me was 100/70  Wt Readings from Last 3 Encounters:  11/12/17 (!) 346 lb (156.9 kg)  08/12/17 (!) 346 lb 12.8 oz (157.3 kg)  08/01/17 (!) 352 lb (159.7 kg)    General: Alert, oriented, no distress.  He has a very raspy voice. Skin: normal turgor, no rashes, warm and dry HEENT: Normocephalic, atraumatic. Pupils equal round and reactive to light; sclera anicteric; extraocular muscles intact;  Nose without nasal septal hypertrophy Mouth/Parynx benign; Mallinpatti scale 3/4 Neck: No JVD, no carotid bruits; normal carotid upstroke Lungs: clear to ausculatation and percussion; no wheezing or rales Chest wall: without tenderness to palpitation Heart: PMI not displaced, RRR, s1 s2 normal, 4-0/0 systolic murmur, no diastolic murmur, no rubs, gallops, thrills, or heaves Abdomen: Moderate central adiposity; soft, nontender; no hepatosplenomehaly, BS+; abdominal aorta nontender and not dilated by palpation. Back: no CVA tenderness Pulses 2+ Musculoskeletal: full range of motion, normal strength, no joint deformities Extremities: no clubbing cyanosis or edema, Homan's sign negative  Neurologic: grossly nonfocal; Cranial nerves grossly wnl Psychologic: Normal mood and affect    Studies/Labs Reviewed:   EKG:  EKG is ordered today.  ECG (independently read by me): Possible A-V dissociation with atrial rate at  840 ms. ?2-1 block and Wenkebach.  rRght bundle branch block ventricular beat with rate at 38 bpm.  QTc interval 414 ms.  July 14, 2017 ECG (independently read by me): Probable A-V dissociation with atrial rate approximatel 83 bpm.  Ventricular rate is variable in the 60s to 70 range with right bundle branch block morphology.  Left axis deviation.  Recent Labs: No flowsheet data found.   No flowsheet data found.  No flowsheet data found. No results found for: MCV No results found for: TSH No results found for: HGBA1C   BNP No results found for: BNP  ProBNP No results found for: PROBNP   Lipid Panel  No results found for: CHOL, TRIG, HDL, CHOLHDL, VLDL, LDLCALC, LDLDIRECT   RADIOLOGY: No results found.   Additional studies/ records that were reviewed today include:  I reviewed the office records from Dr. Gaynelle Green  ------------------------------------------------------------------- ECHO Study Conclusions  - Left ventricle: Systolic function was normal. The estimated   ejection fraction was in the range of 50% to 55%. - Aortic valve: AV is thickened, calcified with restricted motion   Peak and mean gradients through the valve are 21 and 12 mm Hg   respectively consistent with mild AS. - Mitral valve: Calcified annulus. Mildly thickened leaflets . - Left atrium: The atrium was moderately dilated. - Right ventricle: RVEF is difficult to assess even with use of   Definity Overall appears moderately depressed. The cavity size   was mildly dilated. - Right atrium: The atrium was moderately dilated.  Impressions:  - Poor acoustic windows limit study.   ASSESSMENT:    1. Severe obstructive sleep apnea   2. AV dissociation   3. Dyspnea on exertion   4. Class 3 severe obesity with body mass index (BMI) of 45.0 to 49.9 in adult, unspecified obesity type, unspecified whether serious comorbidity present ([place])   5. Snoring   6. Morbid obesity ([place])   7. Hypotension due to drugs   8. Mild aortic stenosis      PLAN:  Mr. Johnavon Mcclafferty is a 77 year old gentleman who has a history of morbid  obesity, type 2 diabetes mellitus, hypothyroidism, hyperlipidemia, psoriasis, who has developed exertional dyspnea.  He also has been labeled as having frequent panic attacks.  Upon further questioning, the patient often wakes up gasping for breath while sleeping and I suspect some of this may be component of severe obstructive sleep apnea.  He has a history of loud snoring, cannot sleep on his back, his sleep is nonrestorative, and he admits to daytime fatigue.  His sleep study has confirmed at least moderate sleep apnea but I suspect this may be worse since rem sleep was not able to be achieved that he cannot sleep on his back.  He has required BiPAP after failing CPAP therapy.  Unfortunately he continues to be noncompliant with BiPAP therapy and is in jeopardy of losing his equipment since he is not meeting compliance within 90 days.  His blood pressure today is low but this may be contributed by the fact that in addition to his medication he inadvertently had taken his wife's carvedilol this morning.  He has significant bradycardia and has periods of second-degree type I block, 2-1 AV block, possible A-V dissociation and has a wide complex right bundle branch block ventricular rhythm.  I suspect he may require a permanent pacemaker.  I am having him see Dr. Caryl Comes in the office tomorrow for further evaluation of his cardiac rhythm and  possible pacemaker therapy.  I reviewed his echo Doppler study and he has an EF of 50 to 55% with mild AS and biatrial enlargement.  I discussed with him that it is imperative that he initiate BiPAP therapy.  Undoubtedly some of his nocturnal arrhythmia may be contributed by his untreated sleep apnea.  He had significant oxygen desaturation to 74% on his sleep study.  Since his blood pressure is low today, I have reduced his losartan to 50 mg.  His edema has improved with furosemide.  He appears clinically hypothyroid.  Apparently he had laboratory checked yesterday at Dr. Andrew Au  office and a preliminary report apparently showed a TSH of 0.28 and in December 2018 his TSH was 3.6, arguing against hypothyroidism.  Potassium 4.7.  I will see him in 4 weeks for reevaluation.   Medication Adjustments/Labs and Tests Ordered: Current medicines are reviewed at length with the patient today.  Concerns regarding medicines are outlined above.  Medication changes, Labs and Tests ordered today are listed in the Patient Instructions below. Patient Instructions  Medication Instructions:  DECREASE Losartan to 50 mg daily  Follow-Up: You have been referred to Cardiac Electrophysiologist at our [facility] (AS[facility])  1 month with Dr. Claiborne Billings    If you need a refill on your cardiac medications before your next appointment, please call your pharmacy.       Signed, Mike Majestic, MD  11/12/2017 2:22 PM    [facility] [address] [place], [place], [place]  [address] Phone: [phone]

## 2017-11-13 ENCOUNTER — Ambulatory Visit: Payer: Medicare HMO | Admitting: Internal Medicine

## 2017-11-13 ENCOUNTER — Encounter: Payer: Self-pay | Admitting: Internal Medicine

## 2017-11-13 VITALS — BP 120/74 | HR 42 | Ht 72.0 in | Wt 344.2 lb

## 2017-11-13 DIAGNOSIS — I350 Nonrheumatic aortic (valve) stenosis: Secondary | ICD-10-CM

## 2017-11-13 DIAGNOSIS — I4589 Other specified conduction disorders: Secondary | ICD-10-CM

## 2017-11-13 DIAGNOSIS — R0609 Other forms of dyspnea: Secondary | ICD-10-CM | POA: Diagnosis not present

## 2017-11-13 DIAGNOSIS — I10 Essential (primary) hypertension: Secondary | ICD-10-CM

## 2017-11-13 NOTE — H&P (View-Only) (Signed)
ELECTROPHYSIOLOGY CONSULT NOTE  Patient ID: [name], MRN: [id number], DOB/AGE: 11-28-1940 77 y.o. Admit date: (Not on file) Date of Consult: 11/13/2017  Primary Physician: [name], MD Primary Cardiologist: TK     [name] is a 77 y.o. male who is being seen today for the evaluation of heart block at the request of TK.    HPI [name] is a 77 y.o. male  For for consideration of pacing.  He was referred to Dr. Leda Gauze 2/19 after he presented to his PCP with recent onset of worsening dyspnea.  He was also noted to have worsening shortness of breath at night with some snoring.  He was noted to be in complete heart block.  It was thought it might be related to metoprolol which had just been discontinued.  Unfortunately, he has persisted with heart block although complete heart block is given rise to 2: 1 heart block and Mobitz 1 heart block.  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  He is continued to be short of breath and fatigue.  He denies chest discomfort.  Over the same period of time however he has had worsening/new problems with peripheral edema.    Intercurrent evaluation is included an echocardiogram demonstrating normal LV function and mild aortic stenosis.  There may have been some degree of RV dysfunction dilatation with moderate RAE.  Images were challenging.  He was found to have sleep apnea but is been intolerant of CPAP/BiPAP therapy  He is morbidly obese.  Past medical history is also notable for psoriasis  Date Cr K  12/18 1.6 4.3         Past Medical History:  Diagnosis Date  . CKD (chronic kidney disease)   . DM type 2 (diabetes mellitus, type 2) ([place])   . ED (erectile dysfunction)   . Fatty liver   . GERD (gastroesophageal reflux disease)   . Gout   . H/O adenomatous polyp of colon   . HLD (hyperlipidemia)   . HTN (hypertension)   . Hypothyroid   . Panic attacks   . Psoriasis        Surgical History: History reviewed. No pertinent surgical history.   Home Meds: Prior to Admission medications   Medication Sig Start Date End Date Taking? Authorizing Provider  ALPRAZolam Duanne Moron) 1 MG tablet Take 1 tablet by mouth as directed. 05/09/17  Yes [provider]  aspirin 81 MG EC tablet Take 1 tablet by mouth daily. 06/03/17  Yes [provider]  atorvastatin (LIPITOR) 40 MG tablet Take by mouth daily. Patient takes 20 mg daily 05/08/17  Yes [provider]  furosemide (LASIX) 40 MG tablet Take 1 tablet (40 mg total) by mouth daily. 07/14/17  Yes Troy Sine, MD  levothyroxine (SYNTHROID, LEVOTHROID) 175 MCG tablet Take 1 tablet by mouth daily. 05/12/17  Yes [provider]  losartan (COZAAR) 50 MG tablet Take 1 tablet (50 mg total) by mouth daily. 11/12/17  Yes Troy Sine, MD  metFORMIN (GLUCOPHAGE) 1000 MG tablet Take 1,000 mg by mouth 2 (two) times daily with a meal.  07/11/17  Yes [provider]  sertraline (ZOLOFT) 100 MG tablet Take 100 mg by mouth daily.   Yes [provider]    Allergies:  Allergies  Allergen Reactions  . Hctz [Hydrochlorothiazide]     Gout flare   . Lexapro [Escitalopram Oxalate]     Ineffective for anxiety   .  Lisinopril     angioedema     Social History   Socioeconomic History  . Marital status: Married    Spouse name: Not on file  . Number of children: Not on file  . Years of education: Not on file  . Highest education level: Not on file  Occupational History  . Not on file  Social Needs  . Financial resource strain: Not on file  . Food insecurity:    Worry: Not on file    Inability: Not on file  . Transportation needs:    Medical: Not on file    Non-medical: Not on file  Tobacco Use  . Smoking status: Never Smoker  . Smokeless tobacco: Never Used  Substance and Sexual Activity  . Alcohol use: No    Alcohol/week: 0.0 oz  . Drug use: No  . Sexual activity: Not on  file  Lifestyle  . Physical activity:    Days per week: Not on file    Minutes per session: Not on file  . Stress: Not on file  Relationships  . Social connections:    Talks on phone: Not on file    Gets together: Not on file    Attends religious service: Not on file    Active member of club or organization: Not on file    Attends meetings of clubs or organizations: Not on file    Relationship status: Not on file  . Intimate partner violence:    Fear of current or ex partner: Not on file    Emotionally abused: Not on file    Physically abused: Not on file    Forced sexual activity: Not on file  Other Topics Concern  . Not on file  Social History Narrative  . Not on file     History reviewed. No pertinent family history.   ROS:  Please see the history of present illness.     All other systems reviewed and negative.    Physical Exam: Blood pressure 120/74, pulse (!) 42, height 6' (1.829 m), weight (!) 344 lb 3.2 oz (156.1 kg), SpO2 98 %. General: Well developed, Morbidly obese  male in no acute distress. Head: Normocephalic, atraumatic, sclera non-icteric, no xanthomas, nares are without discharge. EENT: normal  Lymph Nodes:  none Neck: Negative for carotid bruits. JVD not elevated. Back:without scoliosis kyphosis  Lungs: Clear bilaterally to auscultation without wheezes, rales, or rhonchi. Breathing is unlabored. Heart: Slow and irregular rate rhythm with S1 S2.  2/6 systolic  murmur . No rubs, or gallops appreciated. Abdomen: Soft, non-tender, non-distended with normoactive bowel sounds. No hepatomegaly. No rebound/guarding. No obvious abdominal masses. Msk:  Strength and tone appear normal for age. Extremities: No clubbing or cyanosis.  1+  edema.  Distal pedal pulses are 2+ and equal bilaterally. Skin: Warm and Dry multiple psoriasic form lesions Neuro: Alert and oriented X 3. CN III-XII intact Grossly normal sensory and motor function . Psych:  Responds to questions  appropriately with a normal affect.      Labs: Cardiac Enzymes No results for input(s): CKTOTAL, CKMB, TROPONINI in the last 72 hours. CBC No results found for: WBC, HGB, HCT, MCV, PLT PROTIME: No results for input(s): LABPROT, INR in the last 72 hours. Chemistry No results for input(s): NA, K, CL, CO2, BUN, CREATININE, CALCIUM, PROT, BILITOT, ALKPHOS, ALT, AST, GLUCOSE in the last 168 hours.  Invalid input(s): LABALBU Lipids No results found for: CHOL, HDL, LDLCALC, TRIG BNP No results found for: PROBNP Thyroid  Function Tests: No results for input(s): TSH, T4TOTAL, T3FREE, THYROIDAB in the last 72 hours.  Invalid input(s): FREET3 Miscellaneous No results found for: DDIMER  Radiology/Studies:  No results found.  EKG: Sinus rhythm 75 bpm with 2: 1 and intermittent 3: 2 conduction.  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 Pacing would be recommended for relief of symptoms.  The benefits and risks were reviewed including but not limited to death,  perforation, infection, lead dislodgement and device malfunction.  The patient understands agrees and is willing to proceed.  He is quite anxious.  He desires not to spend the night.  We will try doing first thing.  He is aware that if he were to develop device dependence following pacing that he would need to spend 1-2 nights.  He uses Xanax regularly.  It may be difficult for sedation and he is aware that we might do with his local anesthesia.  I encouraged him to pursue alternative therapies to CPAP for his sleep apnea.  I have reached out to  Dr. Ron Parker as a possible provider of your oral appliance.  Diuretics would be appropriate.  Have encouraged him to increase from 40--80    Virl Axe

## 2017-11-13 NOTE — Progress Notes (Signed)
ELECTROPHYSIOLOGY CONSULT NOTE  Patient ID: [name], MRN: [id number], DOB/AGE: 76-Mar-1942 76 y.o. Admit date: (Not on file) Date of Consult: 11/13/2017  Primary Physician: [name], MD Primary Cardiologist: TK     [name] is a 77 y.o. male who is being seen today for the evaluation of heart block at the request of TK.    HPI [name] is a 77 y.o. male  For for consideration of pacing.  He was referred to [name] 2/19 after he presented to his PCP with recent onset of worsening dyspnea.  He was also noted to have worsening shortness of breath at night with some snoring.  He was noted to be in complete heart block.  It was thought it might be related to metoprolol which had just been discontinued.  Unfortunately, he has persisted with heart block although complete heart block is given rise to 2: 1 heart block and Mobitz 1 heart block.  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  He is continued to be short of breath and fatigue.  He denies chest discomfort.  Over the same period of time however he has had worsening/new problems with peripheral edema.    Intercurrent evaluation is included an echocardiogram demonstrating normal LV function and mild aortic stenosis.  There may have been some degree of RV dysfunction dilatation with moderate RAE.  Images were challenging.  He was found to have sleep apnea but is been intolerant of CPAP/BiPAP therapy  He is morbidly obese.  Past medical history is also notable for psoriasis  Date Cr K  12/18 1.6 4.3         Past Medical History:  Diagnosis Date  . CKD (chronic kidney disease)   . DM type 2 (diabetes mellitus, type 2) ([place])   . ED (erectile dysfunction)   . Fatty liver   . GERD (gastroesophageal reflux disease)   . Gout   . H/O adenomatous polyp of colon   . HLD (hyperlipidemia)   . HTN (hypertension)   . Hypothyroid   . Panic attacks   . Psoriasis        Surgical History: History reviewed. No pertinent surgical history.   Home Meds: Prior to Admission medications   Medication Sig Start Date End Date Taking? Authorizing Provider  ALPRAZolam Duanne Moron) 1 MG tablet Take 1 tablet by mouth as directed. 05/09/17  Yes [provider]  aspirin 81 MG EC tablet Take 1 tablet by mouth daily. 06/03/17  Yes [provider]  atorvastatin (LIPITOR) 40 MG tablet Take by mouth daily. Patient takes 20 mg daily 05/08/17  Yes [provider]  furosemide (LASIX) 40 MG tablet Take 1 tablet (40 mg total) by mouth daily. 07/14/17  Yes Troy Sine, MD  levothyroxine (SYNTHROID, LEVOTHROID) 175 MCG tablet Take 1 tablet by mouth daily. 05/12/17  Yes [provider]  losartan (COZAAR) 50 MG tablet Take 1 tablet (50 mg total) by mouth daily. 11/12/17  Yes Troy Sine, MD  metFORMIN (GLUCOPHAGE) 1000 MG tablet Take 1,000 mg by mouth 2 (two) times daily with a meal.  07/11/17  Yes [provider]  sertraline (ZOLOFT) 100 MG tablet Take 100 mg by mouth daily.   Yes [provider]    Allergies:  Allergies  Allergen Reactions  . Hctz [Hydrochlorothiazide]     Gout flare   . Lexapro [Escitalopram Oxalate]     Ineffective for anxiety   .  Lisinopril     angioedema     Social History   Socioeconomic History  . Marital status: Married    Spouse name: Not on file  . Number of children: Not on file  . Years of education: Not on file  . Highest education level: Not on file  Occupational History  . Not on file  Social Needs  . Financial resource strain: Not on file  . Food insecurity:    Worry: Not on file    Inability: Not on file  . Transportation needs:    Medical: Not on file    Non-medical: Not on file  Tobacco Use  . Smoking status: Never Smoker  . Smokeless tobacco: Never Used  Substance and Sexual Activity  . Alcohol use: No    Alcohol/week: 0.0 oz  . Drug use: No  . Sexual activity: Not on  file  Lifestyle  . Physical activity:    Days per week: Not on file    Minutes per session: Not on file  . Stress: Not on file  Relationships  . Social connections:    Talks on phone: Not on file    Gets together: Not on file    Attends religious service: Not on file    Active member of club or organization: Not on file    Attends meetings of clubs or organizations: Not on file    Relationship status: Not on file  . Intimate partner violence:    Fear of current or ex partner: Not on file    Emotionally abused: Not on file    Physically abused: Not on file    Forced sexual activity: Not on file  Other Topics Concern  . Not on file  Social History Narrative  . Not on file     History reviewed. No pertinent family history.   ROS:  Please see the history of present illness.     All other systems reviewed and negative.    Physical Exam: Blood pressure 120/74, pulse (!) 42, height 6' (1.829 m), weight (!) 344 lb 3.2 oz (156.1 kg), SpO2 98 %. General: Well developed, Morbidly obese  male in no acute distress. Head: Normocephalic, atraumatic, sclera non-icteric, no xanthomas, nares are without discharge. EENT: normal  Lymph Nodes:  none Neck: Negative for carotid bruits. JVD not elevated. Back:without scoliosis kyphosis  Lungs: Clear bilaterally to auscultation without wheezes, rales, or rhonchi. Breathing is unlabored. Heart: Slow and irregular rate rhythm with S1 S2.  2/6 systolic  murmur . No rubs, or gallops appreciated. Abdomen: Soft, non-tender, non-distended with normoactive bowel sounds. No hepatomegaly. No rebound/guarding. No obvious abdominal masses. Msk:  Strength and tone appear normal for age. Extremities: No clubbing or cyanosis.  1+  edema.  Distal pedal pulses are 2+ and equal bilaterally. Skin: Warm and Dry multiple psoriasic form lesions Neuro: Alert and oriented X 3. CN III-XII intact Grossly normal sensory and motor function . Psych:  Responds to questions  appropriately with a normal affect.      Labs: Cardiac Enzymes No results for input(s): CKTOTAL, CKMB, TROPONINI in the last 72 hours. CBC No results found for: WBC, HGB, HCT, MCV, PLT PROTIME: No results for input(s): LABPROT, INR in the last 72 hours. Chemistry No results for input(s): NA, K, CL, CO2, BUN, CREATININE, CALCIUM, PROT, BILITOT, ALKPHOS, ALT, AST, GLUCOSE in the last 168 hours.  Invalid input(s): LABALBU Lipids No results found for: CHOL, HDL, LDLCALC, TRIG BNP No results found for: PROBNP Thyroid  Function Tests: No results for input(s): TSH, T4TOTAL, T3FREE, THYROIDAB in the last 72 hours.  Invalid input(s): FREET3 Miscellaneous No results found for: DDIMER  Radiology/Studies:  No results found.  EKG: Sinus rhythm 75 bpm with 2: 1 and intermittent 3: 2 conduction.  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 Pacing would be recommended for relief of symptoms.  The benefits and risks were reviewed including but not limited to death,  perforation, infection, lead dislodgement and device malfunction.  The patient understands agrees and is willing to proceed.  He is quite anxious.  He desires not to spend the night.  We will try doing first thing.  He is aware that if he were to develop device dependence following pacing that he would need to spend 1-2 nights.  He uses Xanax regularly.  It may be difficult for sedation and he is aware that we might do with his local anesthesia.  I encouraged him to pursue alternative therapies to CPAP for his sleep apnea.  I have reached out to  Dr. Ron Parker as a possible provider of your oral appliance.  Diuretics would be appropriate.  Have encouraged him to increase from 40--80    Virl Axe

## 2017-11-14 ENCOUNTER — Telehealth: Payer: Self-pay | Admitting: Internal Medicine

## 2017-11-14 DIAGNOSIS — I441 Atrioventricular block, second degree: Secondary | ICD-10-CM

## 2017-11-14 NOTE — Telephone Encounter (Signed)
New Message   Pt's wife is calling with question about the pt cathlab appt on 6/24. Please call

## 2017-11-14 NOTE — Telephone Encounter (Signed)
Pt calling with questions about his procedure time. Previous times with other pts that day had been moved to accommodate pt. Dr Caryl Comes will try and let pt go home the same day, however he will need to be the first case in order for that to happen. I did emphasize there was a chance he may still need to spend the night. Dr Caryl Comes will have to assess this after his case. Pt understands the only way he could possibly go home the same day is to be the first case. Pt agrees with plan and had no additional questions.

## 2017-11-19 ENCOUNTER — Other Ambulatory Visit: Payer: Medicare HMO

## 2017-11-19 DIAGNOSIS — I441 Atrioventricular block, second degree: Secondary | ICD-10-CM

## 2017-11-20 LAB — CBC WITH DIFFERENTIAL/PLATELET
Basophils Absolute: 0 10*3/uL (ref 0.0–0.2)
Basos: 0 %
EOS (ABSOLUTE): 0.2 10*3/uL (ref 0.0–0.4)
Eos: 3 %
Hematocrit: 40.6 % (ref 37.5–51.0)
Hemoglobin: 13.4 g/dL (ref 13.0–17.7)
Immature Grans (Abs): 0 10*3/uL (ref 0.0–0.1)
Immature Granulocytes: 0 %
Lymphocytes Absolute: 0.9 10*3/uL (ref 0.7–3.1)
Lymphs: 13 %
MCH: 28.3 pg (ref 26.6–33.0)
MCHC: 33 g/dL (ref 31.5–35.7)
MCV: 86 fL (ref 79–97)
Monocytes Absolute: 0.6 10*3/uL (ref 0.1–0.9)
Monocytes: 8 %
Neutrophils Absolute: 5.1 10*3/uL (ref 1.4–7.0)
Neutrophils: 76 %
Platelets: 197 10*3/uL (ref 150–450)
RBC: 4.74 x10E6/uL (ref 4.14–5.80)
RDW: 15.2 % (ref 12.3–15.4)
WBC: 6.8 10*3/uL (ref 3.4–10.8)

## 2017-11-20 LAB — BASIC METABOLIC PANEL
BUN/Creatinine Ratio: 14 (ref 10–24)
BUN: 26 mg/dL (ref 8–27)
CO2: 24 mmol/L (ref 20–29)
Calcium: 9.7 mg/dL (ref 8.6–10.2)
Chloride: 94 mmol/L — ABNORMAL LOW (ref 96–106)
Creatinine, Ser: 1.84 mg/dL — ABNORMAL HIGH (ref 0.76–1.27)
GFR calc Af Amer: 40 mL/min/{1.73_m2} — ABNORMAL LOW (ref >59)
GFR calc non Af Amer: 35 mL/min/{1.73_m2} — ABNORMAL LOW (ref >59)
Glucose: 94 mg/dL (ref 65–99)
Potassium: 5.1 mmol/L (ref 3.5–5.2)
Sodium: 143 mmol/L (ref 134–144)

## 2017-11-21 ENCOUNTER — Telehealth: Payer: Self-pay | Admitting: Internal Medicine

## 2017-11-21 NOTE — Telephone Encounter (Signed)
Pt's daughter is calling today to ask if her dad usually takes Xanax, is it ok for him to take it the night before his procedure. She states he takes it every night. I told her I felt it was appropriate for him to take if it is what he normally takes every day. She verbalized understanding and had no additional questions.

## 2017-11-21 NOTE — Telephone Encounter (Signed)
New Message:      Pt is getting a pacemaker and wants to know if he can take his ALPRAZolam Duanne Moron) 1 MG tablet the day of the procedure.

## 2017-11-24 ENCOUNTER — Ambulatory Visit (HOSPITAL_COMMUNITY): Payer: Medicare HMO

## 2017-11-24 ENCOUNTER — Ambulatory Visit (HOSPITAL_COMMUNITY)
Admission: RE | Admit: 2017-11-24 | Discharge: 2017-11-24 | Disposition: A | Discharge status: Home / Self Care | Payer: Medicare HMO | Source: Ambulatory Visit | Attending: Internal Medicine | Admitting: Internal Medicine

## 2017-11-24 ENCOUNTER — Ambulatory Visit (HOSPITAL_COMMUNITY)
Admission: RE | Disposition: A | Discharge status: Home / Self Care | Payer: Self-pay | Source: Ambulatory Visit | Attending: Internal Medicine

## 2017-11-24 DIAGNOSIS — N529 Male erectile dysfunction, unspecified: Secondary | ICD-10-CM | POA: Insufficient documentation

## 2017-11-24 DIAGNOSIS — Z6841 Body Mass Index (BMI) 40.0 and over, adult: Secondary | ICD-10-CM | POA: Insufficient documentation

## 2017-11-24 DIAGNOSIS — Z79899 Other long term (current) drug therapy: Secondary | ICD-10-CM | POA: Diagnosis not present

## 2017-11-24 DIAGNOSIS — R001 Bradycardia, unspecified: Secondary | ICD-10-CM | POA: Diagnosis not present

## 2017-11-24 DIAGNOSIS — Z79890 Hormone replacement therapy (postmenopausal): Secondary | ICD-10-CM | POA: Insufficient documentation

## 2017-11-24 DIAGNOSIS — I4510 Unspecified right bundle-branch block: Secondary | ICD-10-CM | POA: Diagnosis not present

## 2017-11-24 DIAGNOSIS — N189 Chronic kidney disease, unspecified: Secondary | ICD-10-CM | POA: Diagnosis not present

## 2017-11-24 DIAGNOSIS — R0602 Shortness of breath: Secondary | ICD-10-CM | POA: Insufficient documentation

## 2017-11-24 DIAGNOSIS — I129 Hypertensive chronic kidney disease with stage 1 through stage 4 chronic kidney disease, or unspecified chronic kidney disease: Secondary | ICD-10-CM | POA: Insufficient documentation

## 2017-11-24 DIAGNOSIS — J984 Other disorders of lung: Secondary | ICD-10-CM | POA: Diagnosis not present

## 2017-11-24 DIAGNOSIS — E785 Hyperlipidemia, unspecified: Secondary | ICD-10-CM | POA: Diagnosis not present

## 2017-11-24 DIAGNOSIS — J9811 Atelectasis: Secondary | ICD-10-CM | POA: Diagnosis not present

## 2017-11-24 DIAGNOSIS — E039 Hypothyroidism, unspecified: Secondary | ICD-10-CM | POA: Diagnosis not present

## 2017-11-24 DIAGNOSIS — E1122 Type 2 diabetes mellitus with diabetic chronic kidney disease: Secondary | ICD-10-CM | POA: Insufficient documentation

## 2017-11-24 DIAGNOSIS — I2729 Other secondary pulmonary hypertension: Secondary | ICD-10-CM | POA: Diagnosis not present

## 2017-11-24 DIAGNOSIS — M109 Gout, unspecified: Secondary | ICD-10-CM | POA: Diagnosis not present

## 2017-11-24 DIAGNOSIS — Z7984 Long term (current) use of oral hypoglycemic drugs: Secondary | ICD-10-CM | POA: Insufficient documentation

## 2017-11-24 DIAGNOSIS — K219 Gastro-esophageal reflux disease without esophagitis: Secondary | ICD-10-CM | POA: Diagnosis not present

## 2017-11-24 DIAGNOSIS — I441 Atrioventricular block, second degree: Secondary | ICD-10-CM

## 2017-11-24 DIAGNOSIS — Z86010 Personal history of colonic polyps: Secondary | ICD-10-CM | POA: Diagnosis not present

## 2017-11-24 DIAGNOSIS — I442 Atrioventricular block, complete: Secondary | ICD-10-CM | POA: Insufficient documentation

## 2017-11-24 DIAGNOSIS — G4733 Obstructive sleep apnea (adult) (pediatric): Secondary | ICD-10-CM | POA: Insufficient documentation

## 2017-11-24 DIAGNOSIS — Z959 Presence of cardiac and vascular implant and graft, unspecified: Secondary | ICD-10-CM

## 2017-11-24 DIAGNOSIS — I452 Bifascicular block: Secondary | ICD-10-CM | POA: Diagnosis not present

## 2017-11-24 DIAGNOSIS — Z7982 Long term (current) use of aspirin: Secondary | ICD-10-CM | POA: Insufficient documentation

## 2017-11-24 DIAGNOSIS — Z888 Allergy status to other drugs, medicaments and biological substances status: Secondary | ICD-10-CM | POA: Diagnosis not present

## 2017-11-24 HISTORY — PX: PACEMAKER IMPLANT: EP1218

## 2017-11-24 LAB — SURGICAL PCR SCREEN
MRSA, PCR: NEGATIVE
Staphylococcus aureus: POSITIVE — AB

## 2017-11-24 LAB — GLUCOSE, CAPILLARY
Glucose-Capillary: 74 mg/dL (ref 65–99)
Glucose-Capillary: 78 mg/dL (ref 65–99)

## 2017-11-24 SURGERY — PACEMAKER IMPLANT

## 2017-11-24 MED ORDER — LIDOCAINE HCL (PF) 1 % IJ SOLN
INTRAMUSCULAR | Status: AC
  Filled 2017-11-24: qty 30

## 2017-11-24 MED ORDER — SODIUM CHLORIDE 0.9 % IV SOLN
80.0000 mg | INTRAVENOUS | Status: AC
  Administered 2017-11-24: 80 mg

## 2017-11-24 MED ORDER — DEXTROSE 5 % IV SOLN
3.0000 g | INTRAVENOUS | Status: AC
  Administered 2017-11-24: 3 g via INTRAVENOUS
  Filled 2017-11-24 (×2): qty 3000

## 2017-11-24 MED ORDER — FENTANYL CITRATE (PF) 100 MCG/2ML IJ SOLN
INTRAMUSCULAR | Status: DC | PRN
  Administered 2017-11-24: 50 ug via INTRAVENOUS
  Administered 2017-11-24: 25 ug via INTRAVENOUS

## 2017-11-24 MED ORDER — MUPIROCIN 2 % EX OINT
TOPICAL_OINTMENT | CUTANEOUS | Status: AC
  Administered 2017-11-24: 1 via TOPICAL
  Filled 2017-11-24: qty 22

## 2017-11-24 MED ORDER — FENTANYL CITRATE (PF) 100 MCG/2ML IJ SOLN
INTRAMUSCULAR | Status: AC
  Filled 2017-11-24: qty 2

## 2017-11-24 MED ORDER — SODIUM CHLORIDE 0.9 % IV SOLN
INTRAVENOUS | Status: AC
  Filled 2017-11-24: qty 2

## 2017-11-24 MED ORDER — SODIUM CHLORIDE 0.9 % IV SOLN: INTRAVENOUS | Status: AC

## 2017-11-24 MED ORDER — MUPIROCIN 2 % EX OINT
1.0000 "application " | TOPICAL_OINTMENT | Freq: Once | CUTANEOUS | Status: AC
  Administered 2017-11-24: 1 via TOPICAL
  Filled 2017-11-24: qty 22

## 2017-11-24 MED ORDER — CHLORHEXIDINE GLUCONATE 4 % EX LIQD
60.0000 mL | Freq: Once | CUTANEOUS | Status: DC
  Filled 2017-11-24: qty 60

## 2017-11-24 MED ORDER — YOU HAVE A PACEMAKER BOOK
Freq: Once | Status: DC
  Filled 2017-11-24 (×3): qty 1

## 2017-11-24 MED ORDER — ACETAMINOPHEN 325 MG PO TABS: 325.0000 mg | ORAL_TABLET | ORAL | Status: DC | PRN

## 2017-11-24 MED ORDER — MIDAZOLAM HCL 5 MG/5ML IJ SOLN
INTRAMUSCULAR | Status: AC
  Filled 2017-11-24: qty 5

## 2017-11-24 MED ORDER — SODIUM CHLORIDE 0.9 % IV SOLN
INTRAVENOUS | Status: DC
  Administered 2017-11-24: 07:00:00 via INTRAVENOUS

## 2017-11-24 MED ORDER — MIDAZOLAM HCL 5 MG/5ML IJ SOLN
INTRAMUSCULAR | Status: DC | PRN
  Administered 2017-11-24: 1 mg via INTRAVENOUS
  Administered 2017-11-24: 2 mg via INTRAVENOUS

## 2017-11-24 MED ORDER — LIDOCAINE HCL (PF) 1 % IJ SOLN
INTRAMUSCULAR | Status: DC | PRN
  Administered 2017-11-24: 60 mL

## 2017-11-24 MED ORDER — CEFAZOLIN SODIUM-DEXTROSE 1-4 GM/50ML-% IV SOLN: 1.0000 g | Freq: Four times a day (QID) | INTRAVENOUS | Status: DC

## 2017-11-24 MED ORDER — HEPARIN (PORCINE) IN NACL 2-0.9 UNITS/ML
INTRAMUSCULAR | Status: AC | PRN
  Administered 2017-11-24: 500 mL

## 2017-11-24 MED ORDER — ONDANSETRON HCL 4 MG/2ML IJ SOLN: 4.0000 mg | Freq: Four times a day (QID) | INTRAMUSCULAR | Status: DC | PRN

## 2017-11-24 MED ORDER — MUPIROCIN 2 % EX OINT: 1.0000 "application " | TOPICAL_OINTMENT | Freq: Once | CUTANEOUS | Status: DC

## 2017-11-24 SURGICAL SUPPLY — 9 items
CABLE SURGICAL S-101-97-12 (CABLE) ×3 IMPLANT
HEMOSTAT SURGICEL 2X4 FIBR (HEMOSTASIS) ×2 IMPLANT
HOVERMATT SINGLE USE (MISCELLANEOUS) ×2 IMPLANT
LEAD CAPSURE NOVUS 5076-52CM (Lead) ×2 IMPLANT
LEAD CAPSURE NOVUS 5076-58CM (Lead) ×2 IMPLANT
PACEMAKER ASSURITY DR-RF (Pacemaker) ×2 IMPLANT
PAD DEFIB LIFELINK (PAD) ×3 IMPLANT
SHEATH CLASSIC 7F (SHEATH) ×4 IMPLANT
TRAY PACEMAKER INSERTION (PACKS) ×3 IMPLANT

## 2017-11-24 NOTE — Progress Notes (Signed)
Spoke with Amber,NP and notified her that telemetry called and that monitor showing 3rd degree AV block with heart rate in 30's and 12 lead EKG done per [employer]

## 2017-11-24 NOTE — Discharge Instructions (Signed)
° ° °  Supplemental Discharge Instructions for  Pacemaker/Defibrillator Patients  Activity No heavy lifting or vigorous activity with your left/right arm for 6 to 8 weeks.  Do not raise your left/right arm above your head for one week.  Gradually raise your affected arm as drawn below.           __          11/28/17                 11/29/17                   11/30/17                  12/01/17  NO DRIVING for  1 week   ; you may begin driving on  09/10/42   .  WOUND CARE - Keep the wound area clean and dry.  - No bandage is needed on the site.  DO  NOT apply any creams, oils, or ointments to the wound area. - If you notice any drainage or discharge from the wound, any swelling or bruising at the site, or you develop a fever > 101? F after you are discharged home, call the office at once.  Special Instructions - You are still able to use cellular telephones; use the ear opposite the side where you have your pacemaker/defibrillator.  Avoid carrying your cellular phone near your device. - When traveling through airports, show security personnel your identification card to avoid being screened in the metal detectors.  Ask the security personnel to use the hand wand. - Avoid arc welding equipment, MRI testing (magnetic resonance imaging), TENS units (transcutaneous nerve stimulators).  Call the office for questions about other devices. - Avoid electrical appliances that are in poor condition or are not properly grounded. - Microwave ovens are safe to be near or to operate.

## 2017-11-24 NOTE — Progress Notes (Signed)
Client given mupirocin and mupirocin instruction sheet prior to d/c and instructed to continue mupirocin

## 2017-11-24 NOTE — Progress Notes (Signed)
Dr Caryl Comes notified of telemetry monitor tech called to notify me of client's heart rate in the 30's and 3rd degree AV block; no new orders

## 2017-11-24 NOTE — Interval H&P Note (Signed)
History and Physical Interval Note:  11/24/2017 7:10 AM  [name]  has presented today for surgery, with the diagnosis of hb  The various methods of treatment have been discussed with the patient and family. After consideration of risks, benefits and other options for treatment, the patient has consented to  Procedure(s): PACEMAKER IMPLANT (N/A) as a surgical intervention .  The patient's history has been reviewed, patient examined, no change in status, stable for surgery.  I have reviewed the patient's chart and labs.  Questions were answered to the patient's satisfaction.     Virl Axe

## 2017-11-25 ENCOUNTER — Ambulatory Visit (INDEPENDENT_AMBULATORY_CARE_PROVIDER_SITE_OTHER): Payer: Self-pay | Admitting: *Deleted

## 2017-11-25 ENCOUNTER — Encounter (HOSPITAL_COMMUNITY): Payer: Self-pay | Admitting: Internal Medicine

## 2017-11-25 DIAGNOSIS — Z950 Presence of cardiac pacemaker: Secondary | ICD-10-CM

## 2017-11-25 DIAGNOSIS — I441 Atrioventricular block, second degree: Secondary | ICD-10-CM

## 2017-11-25 LAB — CUP PACEART INCLINIC DEVICE CHECK
Battery Remaining Longevity: 164 mo
Battery Voltage: 3.05 V
Brady Statistic RA Percent Paced: 0 %
Brady Statistic RV Percent Paced: 1.8 %
Date Time Interrogation Session: 20190625160354
Implantable Lead Implant Date: 20190624
Implantable Lead Implant Date: 20190624
Implantable Lead Location: 753859
Implantable Lead Location: 753860
Implantable Lead Model: 5076
Implantable Lead Model: 5076
Implantable Pulse Generator Implant Date: 20190624
Lead Channel Impedance Value: 475 Ohm
Lead Channel Impedance Value: 587.5 Ohm
Lead Channel Pacing Threshold Amplitude: 0.5 V
Lead Channel Pacing Threshold Amplitude: 0.5 V
Lead Channel Pacing Threshold Pulse Width: 0.5 ms
Lead Channel Pacing Threshold Pulse Width: 0.5 ms
Lead Channel Sensing Intrinsic Amplitude: 10.9 mV
Lead Channel Sensing Intrinsic Amplitude: 5 mV
Lead Channel Setting Pacing Amplitude: 3.5 V
Lead Channel Setting Pacing Amplitude: 3.5 V
Lead Channel Setting Pacing Pulse Width: 0.5 ms
Lead Channel Setting Sensing Sensitivity: 2 mV
Pulse Gen Model: 2272
Pulse Gen Serial Number: 9033095

## 2017-11-25 MED FILL — Lidocaine HCl Local Preservative Free (PF) Inj 1%: INTRAMUSCULAR | Qty: 30 | Status: AC

## 2017-11-25 NOTE — Progress Notes (Signed)
Next-day check in clinic with PPM check by industry. Dermabond remains in place at incision, incision edges approximated, site appears clean, dry, and intact. Normal device function. Thresholds, sensing, and impedances consistent with implant measurements. No high ventricular rates noted. Per order from [facility]: reprogrammed mode to DDD 60 from VVI 35, PAV/SAV set at 147ms/170ms. RA/RV outputs at 3.5V for appropriate safety margin. Initial post-hospital education completed. Wound check on 12/08/17, ROV with [facility] on 02/26/18.

## 2017-11-27 ENCOUNTER — Telehealth: Payer: Self-pay

## 2017-11-27 NOTE — Telephone Encounter (Signed)
Pt was originally scheduled for a wound check for 12/03/2017 but called and rescheduled for 12/08/2017 but he absolutely can not come on the 12/08/2017 and wants the 12/03/2017 appointment back. Please call them back ASAP. [phone]

## 2017-11-27 NOTE — Telephone Encounter (Signed)
Pt called back and said he can keep the 12/08/2017 appointment. I'm going to call patient back and confirmed appointment.

## 2017-11-27 NOTE — Telephone Encounter (Signed)
Opening error 

## 2017-12-01 DIAGNOSIS — G4733 Obstructive sleep apnea (adult) (pediatric): Secondary | ICD-10-CM | POA: Diagnosis not present

## 2017-12-03 ENCOUNTER — Ambulatory Visit: Payer: Medicare HMO

## 2017-12-08 ENCOUNTER — Ambulatory Visit (INDEPENDENT_AMBULATORY_CARE_PROVIDER_SITE_OTHER): Payer: Medicare HMO | Admitting: *Deleted

## 2017-12-08 DIAGNOSIS — Z950 Presence of cardiac pacemaker: Secondary | ICD-10-CM

## 2017-12-08 DIAGNOSIS — I441 Atrioventricular block, second degree: Secondary | ICD-10-CM | POA: Diagnosis not present

## 2017-12-08 LAB — CUP PACEART INCLINIC DEVICE CHECK
Battery Remaining Longevity: 74 mo
Battery Voltage: 3.05 V
Brady Statistic RA Percent Paced: 4.7 %
Brady Statistic RV Percent Paced: 99.78 %
Date Time Interrogation Session: 20190708142933
Implantable Lead Implant Date: 20190624
Implantable Lead Implant Date: 20190624
Implantable Lead Location: 753859
Implantable Lead Location: 753860
Implantable Lead Model: 5076
Implantable Lead Model: 5076
Implantable Pulse Generator Implant Date: 20190624
Lead Channel Impedance Value: 487.5 Ohm
Lead Channel Impedance Value: 525 Ohm
Lead Channel Pacing Threshold Amplitude: 0.5 V
Lead Channel Pacing Threshold Amplitude: 0.5 V
Lead Channel Pacing Threshold Amplitude: 0.75 V
Lead Channel Pacing Threshold Amplitude: 0.75 V
Lead Channel Pacing Threshold Pulse Width: 0.5 ms
Lead Channel Pacing Threshold Pulse Width: 0.5 ms
Lead Channel Pacing Threshold Pulse Width: 0.5 ms
Lead Channel Pacing Threshold Pulse Width: 0.5 ms
Lead Channel Sensing Intrinsic Amplitude: 10.1 mV
Lead Channel Sensing Intrinsic Amplitude: 5 mV
Lead Channel Setting Pacing Amplitude: 3.5 V
Lead Channel Setting Pacing Amplitude: 3.5 V
Lead Channel Setting Pacing Pulse Width: 0.5 ms
Lead Channel Setting Sensing Sensitivity: 2 mV
Pulse Gen Model: 2272
Pulse Gen Serial Number: 9033095

## 2017-12-08 NOTE — Progress Notes (Signed)
Wound check appointment. Dermabond removed. Wound without redness or edema. Incision edges approximated, wound well healed. Normal device function. Thresholds, sensing, and impedances consistent with implant measurements. Device programmed at 3.5V on for extra safety margin until 3 month visit. Histogram distribution appropriate for patient and level of activity. 1 mode switch- 8sec AT. No high ventricular rates noted. Patient educated about wound care, arm mobility, lifting restrictions and Merlin monitoring. ROV with SK 02/26/18.

## 2018-01-01 DIAGNOSIS — G4733 Obstructive sleep apnea (adult) (pediatric): Secondary | ICD-10-CM | POA: Diagnosis not present

## 2018-01-05 DIAGNOSIS — C44319 Basal cell carcinoma of skin of other parts of face: Secondary | ICD-10-CM | POA: Diagnosis not present

## 2018-01-29 ENCOUNTER — Encounter: Payer: Self-pay | Admitting: Internal Medicine

## 2018-02-01 DIAGNOSIS — G4733 Obstructive sleep apnea (adult) (pediatric): Secondary | ICD-10-CM | POA: Diagnosis not present

## 2018-02-23 ENCOUNTER — Ambulatory Visit (INDEPENDENT_AMBULATORY_CARE_PROVIDER_SITE_OTHER): Payer: Medicare HMO | Admitting: Internal Medicine

## 2018-02-23 ENCOUNTER — Encounter: Payer: Self-pay | Admitting: Internal Medicine

## 2018-02-23 VITALS — BP 122/78 | HR 98 | Ht 72.0 in | Wt 346.2 lb

## 2018-02-23 DIAGNOSIS — I441 Atrioventricular block, second degree: Secondary | ICD-10-CM | POA: Diagnosis not present

## 2018-02-23 DIAGNOSIS — I10 Essential (primary) hypertension: Secondary | ICD-10-CM

## 2018-02-23 DIAGNOSIS — Z959 Presence of cardiac and vascular implant and graft, unspecified: Secondary | ICD-10-CM | POA: Diagnosis not present

## 2018-02-23 LAB — CUP PACEART INCLINIC DEVICE CHECK
Battery Remaining Longevity: 106 mo
Battery Voltage: 3.02 V
Brady Statistic RA Percent Paced: 1.2 %
Brady Statistic RV Percent Paced: 99.37 %
Date Time Interrogation Session: 20190923141633
Implantable Lead Implant Date: 20190624
Implantable Lead Implant Date: 20190624
Implantable Lead Location: 753859
Implantable Lead Location: 753860
Implantable Lead Model: 5076
Implantable Lead Model: 5076
Implantable Pulse Generator Implant Date: 20190624
Lead Channel Impedance Value: 537.5 Ohm
Lead Channel Impedance Value: 537.5 Ohm
Lead Channel Pacing Threshold Amplitude: 0.75 V
Lead Channel Pacing Threshold Amplitude: 0.75 V
Lead Channel Pacing Threshold Pulse Width: 0.5 ms
Lead Channel Pacing Threshold Pulse Width: 0.5 ms
Lead Channel Sensing Intrinsic Amplitude: 5 mV
Lead Channel Sensing Intrinsic Amplitude: 8.1 mV
Lead Channel Setting Pacing Amplitude: 2 V
Lead Channel Setting Pacing Amplitude: 2.5 V
Lead Channel Setting Pacing Pulse Width: 0.5 ms
Lead Channel Setting Sensing Sensitivity: 2 mV
Pulse Gen Model: 2272
Pulse Gen Serial Number: 9033095

## 2018-02-23 NOTE — Patient Instructions (Addendum)
Medication Instructions:  Your physician recommends that you continue on your current medications as directed. Please refer to the Current Medication list given to you today.  Labwork: You will have labs drawn today: BMP   Testing/Procedures: None ordered.  Follow-Up: Your physician recommends that you schedule a follow-up appointment in:   9 months with Chanetta Marshall, NP.  Remote monitoring is used to monitor your Pacemaker of ICD from home. This monitoring reduces the number of office visits required to check your device to one time per year. It allows [place] to keep an eye on the functioning of your device to ensure it is working properly. You are scheduled for a device check from home on Dec 23. You may send your transmission at any time that day. If you have a wireless device, the transmission will be sent automatically. After your physician reviews your transmission, you will receive a postcard with your next transmission date.   Any Other Special Instructions Will Be Listed Below (If Applicable).     If you need a refill on your cardiac medications before your next appointment, please call your pharmacy.

## 2018-02-23 NOTE — Progress Notes (Signed)
Patient Care Team: [name], MD as PCP - General (Family Medicine)   HPI  [name] is a 77 y.o. 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  Date Cr K  12/18 1.6 4.3  6/19  1.84 5.1   He is much improved following pacemaker implantation with less dyspnea.  No edema.  Denies chest pain.  He has been unable to tolerate CPAP.   Records and Results Reviewed   Past Medical History:  Diagnosis Date  . CKD (chronic kidney disease)   . DM type 2 (diabetes mellitus, type 2) ([place])   . ED (erectile dysfunction)   . Fatty liver   . GERD (gastroesophageal reflux disease)   . Gout   . H/O adenomatous polyp of colon   . HLD (hyperlipidemia)   . HTN (hypertension)   . Hypothyroid   . Panic attacks   . Psoriasis     Past Surgical History:  Procedure Laterality Date  . PACEMAKER IMPLANT N/A 11/24/2017   Procedure: PACEMAKER IMPLANT;  Surgeon: Deboraha Sprang, MD;  Location: [facility];  Service: Cardiovascular;  Laterality: N/A;    Current Meds  Medication Sig  . ALPRAZolam (XANAX) 1 MG tablet Take 0.5-1 mg by mouth daily as needed for anxiety.   Marland Kitchen aspirin 81 MG EC tablet Take 81 mg by mouth daily.   Marland Kitchen atorvastatin (LIPITOR) 40 MG tablet Take 20 mg by mouth daily.   . furosemide (LASIX) 40 MG tablet Take 1 tablet (40 mg total) by mouth daily.  Marland Kitchen levothyroxine (SYNTHROID, LEVOTHROID) 175 MCG tablet Take 175 mcg by mouth daily.   Marland Kitchen losartan (COZAAR) 50 MG tablet Take 75 mg by mouth daily.  . metFORMIN (GLUCOPHAGE) 1000 MG tablet Take 1,000 mg by mouth 2 (two) times daily with a meal.   . PARoxetine (PAXIL) 40 MG tablet Take 40 mg by mouth daily.  [name] Glycol-Propyl Glycol (SYSTANE ULTRA) 0.4-0.3 % SOLN Place 1 drop into both eyes daily as needed (dry eyes).    Allergies  Allergen Reactions  . Hctz [Hydrochlorothiazide]     Gout  flare   . Lexapro [Escitalopram Oxalate]     Ineffective for anxiety   . Lisinopril     angioedema       Review of Systems negative except from HPI and PMH  Physical Exam BP 122/78   Pulse 98   Ht 6' (1.829 m)   Wt (!) 346 lb 3.2 oz (157 kg)   SpO2 94%   BMI 46.95 kg/m  Well developed and well nourished in no acute distress HENT normal E scleral and icterus clear Neck Supple JVP flat; carotids brisk and full Clear to ausculation Regular rate and rhythm, no murmurs gallops or rub Soft with active bowel sounds No clubbing cyanosis tr  Edema Alert and oriented, grossly normal motor and sensory function Skin Warm and Dry lots of psoriasis  ECG demonstrates sinus rhythm with P synchronous pacing  Assessment and  Plan  Right bundle branch block left anterior fascicular block  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  The device was reprogrammed to maximize longevity   Obstructive sleep apnea  Pulmonary hypertension/cor pulmonale/right heart failure   Minimal edema.  We will have him change his diuretics to every other day.  We will check his metabolic profile given his most recent creatinine being increased potassium borderline elevated  I have given him contact information for Dr. Oneal Grout as to consider an oral appliance for his sleep apnea    Current medicines are reviewed at length with the patient today .  The patient does not  have concerns regarding medicines.

## 2018-02-24 LAB — BASIC METABOLIC PANEL
BUN/Creatinine Ratio: 15 (ref 10–24)
BUN: 29 mg/dL — ABNORMAL HIGH (ref 8–27)
CO2: 21 mmol/L (ref 20–29)
Calcium: 9.8 mg/dL (ref 8.6–10.2)
Chloride: 99 mmol/L (ref 96–106)
Creatinine, Ser: 1.88 mg/dL — ABNORMAL HIGH (ref 0.76–1.27)
GFR calc Af Amer: 39 mL/min/{1.73_m2} — ABNORMAL LOW (ref >59)
GFR calc non Af Amer: 34 mL/min/{1.73_m2} — ABNORMAL LOW (ref >59)
Glucose: 116 mg/dL — ABNORMAL HIGH (ref 65–99)
Potassium: 4.9 mmol/L (ref 3.5–5.2)
Sodium: 138 mmol/L (ref 134–144)

## 2018-02-26 ENCOUNTER — Encounter: Payer: Medicare HMO | Admitting: Internal Medicine

## 2018-03-03 ENCOUNTER — Telehealth: Payer: Self-pay | Admitting: Cardiovascular Disease

## 2018-03-03 ENCOUNTER — Other Ambulatory Visit: Payer: Self-pay

## 2018-03-03 DIAGNOSIS — G4733 Obstructive sleep apnea (adult) (pediatric): Secondary | ICD-10-CM | POA: Diagnosis not present

## 2018-03-03 MED ORDER — FUROSEMIDE 40 MG PO TABS: 40.0000 mg | ORAL_TABLET | ORAL | 3 refills | Status: DC

## 2018-03-03 MED ORDER — LOSARTAN POTASSIUM 50 MG PO TABS: 50.0000 mg | ORAL_TABLET | Freq: Every day | ORAL | Status: DC

## 2018-03-03 NOTE — Telephone Encounter (Signed)
With recent creatinine at 1.88, can consider decreasing losartan to 50 mg daily.

## 2018-03-03 NOTE — Telephone Encounter (Signed)
New message  Pt c/o medication issue:  1. Name of Medication: losartan (COZAAR) 50 MG tablet  2. How are you currently taking this medication (dosage and times per day)? 1 1/2 tablet per day  3. Are you having a reaction (difficulty breathing--STAT)? No   4. What is your medication issue?patient wants to know if he should be taking a 50 or 75 mg tablet?    Pt c/o medication issue:  1. Name of Medication: furosemide (LASIX) 40 MG tablet  2. How are you currently taking this medication (dosage and times per day)? 1 time daily  3. Are you having a reaction (difficulty breathing--STAT)? No   4. What is your medication issue? Patient wants to know he should continue taking a 40 mg or it needs to be cut back to 20 mg.

## 2018-03-03 NOTE — Telephone Encounter (Signed)
Pt called to report that Dr. Caryl Comes had decreased his lasix to qod and he wanted to be sure he was suppose to decrease his Losartan to 50mg  a day and I advised him that was the plan at his 6/19 appt. Pt med list updated.. Pt scheduled follow up with Dr. Claiborne Billings for 06/2018 but may need sooner but none available will forward to Dr. Claiborne Billings nurse to see if he should be coming in sooner. Last OV in 6/19 said to return in 1 month... No labs ordered at Dr. Caryl Comes visit.

## 2018-03-06 NOTE — Telephone Encounter (Signed)
Spoke with wife (ok per DPR) aware of recommendations and verbalized understanding.

## 2018-03-09 ENCOUNTER — Ambulatory Visit: Payer: Medicare HMO | Admitting: Internal Medicine

## 2018-04-01 DIAGNOSIS — E119 Type 2 diabetes mellitus without complications: Secondary | ICD-10-CM | POA: Diagnosis not present

## 2018-04-01 DIAGNOSIS — H5210 Myopia, unspecified eye: Secondary | ICD-10-CM | POA: Diagnosis not present

## 2018-04-03 DIAGNOSIS — G4733 Obstructive sleep apnea (adult) (pediatric): Secondary | ICD-10-CM | POA: Diagnosis not present

## 2018-05-03 DIAGNOSIS — G4733 Obstructive sleep apnea (adult) (pediatric): Secondary | ICD-10-CM | POA: Diagnosis not present

## 2018-05-13 DIAGNOSIS — N400 Enlarged prostate without lower urinary tract symptoms: Secondary | ICD-10-CM | POA: Diagnosis not present

## 2018-05-13 DIAGNOSIS — E039 Hypothyroidism, unspecified: Secondary | ICD-10-CM | POA: Diagnosis not present

## 2018-05-13 DIAGNOSIS — I129 Hypertensive chronic kidney disease with stage 1 through stage 4 chronic kidney disease, or unspecified chronic kidney disease: Secondary | ICD-10-CM | POA: Diagnosis not present

## 2018-05-13 DIAGNOSIS — F411 Generalized anxiety disorder: Secondary | ICD-10-CM | POA: Diagnosis not present

## 2018-05-13 DIAGNOSIS — E7800 Pure hypercholesterolemia, unspecified: Secondary | ICD-10-CM | POA: Diagnosis not present

## 2018-05-13 DIAGNOSIS — N183 Chronic kidney disease, stage 3 (moderate): Secondary | ICD-10-CM | POA: Diagnosis not present

## 2018-05-13 DIAGNOSIS — E1129 Type 2 diabetes mellitus with other diabetic kidney complication: Secondary | ICD-10-CM | POA: Diagnosis not present

## 2018-05-13 DIAGNOSIS — F39 Unspecified mood [affective] disorder: Secondary | ICD-10-CM | POA: Diagnosis not present

## 2018-05-13 DIAGNOSIS — Z23 Encounter for immunization: Secondary | ICD-10-CM | POA: Diagnosis not present

## 2018-05-25 ENCOUNTER — Ambulatory Visit (INDEPENDENT_AMBULATORY_CARE_PROVIDER_SITE_OTHER): Payer: Medicare HMO

## 2018-05-25 DIAGNOSIS — I441 Atrioventricular block, second degree: Secondary | ICD-10-CM | POA: Diagnosis not present

## 2018-05-25 NOTE — Progress Notes (Signed)
Remote pacemaker transmission.   

## 2018-05-27 LAB — CUP PACEART REMOTE DEVICE CHECK
Battery Remaining Longevity: 103 mo
Battery Remaining Percentage: 95.5 %
Battery Voltage: 3.02 V
Brady Statistic AP VP Percent: 1.5 %
Brady Statistic AP VS Percent: 1 %
Brady Statistic AS VP Percent: 98 %
Brady Statistic AS VS Percent: 1 %
Brady Statistic RA Percent Paced: 1.3 %
Brady Statistic RV Percent Paced: 99 %
Date Time Interrogation Session: 20191223070022
Implantable Lead Implant Date: 20190624
Implantable Lead Implant Date: 20190624
Implantable Lead Location: 753859
Implantable Lead Location: 753860
Implantable Lead Model: 5076
Implantable Lead Model: 5076
Implantable Pulse Generator Implant Date: 20190624
Lead Channel Impedance Value: 510 Ohm
Lead Channel Impedance Value: 510 Ohm
Lead Channel Pacing Threshold Amplitude: 0.75 V
Lead Channel Pacing Threshold Amplitude: 0.75 V
Lead Channel Pacing Threshold Pulse Width: 0.5 ms
Lead Channel Pacing Threshold Pulse Width: 0.5 ms
Lead Channel Sensing Intrinsic Amplitude: 5 mV
Lead Channel Sensing Intrinsic Amplitude: 8.1 mV
Lead Channel Setting Pacing Amplitude: 2 V
Lead Channel Setting Pacing Amplitude: 2.5 V
Lead Channel Setting Pacing Pulse Width: 0.5 ms
Lead Channel Setting Sensing Sensitivity: 2 mV
Pulse Gen Model: 2272
Pulse Gen Serial Number: 9033095

## 2018-06-03 DIAGNOSIS — G4733 Obstructive sleep apnea (adult) (pediatric): Secondary | ICD-10-CM | POA: Diagnosis not present

## 2018-06-11 ENCOUNTER — Encounter: Payer: Self-pay | Admitting: Cardiovascular Disease

## 2018-06-11 ENCOUNTER — Telehealth: Payer: Self-pay | Admitting: *Deleted

## 2018-06-11 ENCOUNTER — Ambulatory Visit: Payer: Medicare HMO | Admitting: Cardiovascular Disease

## 2018-06-11 VITALS — BP 136/85 | HR 87 | Ht 72.0 in | Wt 354.4 lb

## 2018-06-11 DIAGNOSIS — I441 Atrioventricular block, second degree: Secondary | ICD-10-CM

## 2018-06-11 DIAGNOSIS — I10 Essential (primary) hypertension: Secondary | ICD-10-CM

## 2018-06-11 DIAGNOSIS — I350 Nonrheumatic aortic (valve) stenosis: Secondary | ICD-10-CM

## 2018-06-11 DIAGNOSIS — E039 Hypothyroidism, unspecified: Secondary | ICD-10-CM | POA: Diagnosis not present

## 2018-06-11 DIAGNOSIS — I4589 Other specified conduction disorders: Secondary | ICD-10-CM

## 2018-06-11 DIAGNOSIS — G4733 Obstructive sleep apnea (adult) (pediatric): Secondary | ICD-10-CM | POA: Diagnosis not present

## 2018-06-11 DIAGNOSIS — Z950 Presence of cardiac pacemaker: Secondary | ICD-10-CM

## 2018-06-11 NOTE — Progress Notes (Signed)
Cardiology Office Note    Date:  06/13/2018   ID:  [name], DOB 1940/12/26, MRN [id number]  PCP:  [name], MD  Cardiologist:  Shelva Majestic, MD   F/u cardiology evaluation  History of Present Illness:  [name] is a 78 y.o. male who wasreferred through the courtesy of Dr. Marisue Humble for evaluation of bradycardia and shortness of breath.  The patient is the first cousin of [name] who recently established cardiology care with me.  I initially saw him on July 14, 2017. I last saw him in June 2019.  He presents for 50-month follow-up evaluation  [name] has history of hypertension, diabetes mellitus, hyperlipidemia, in addition to hypothyroidism.  He has had a history of presumed anxiety and panic attacks.  He admits to significant dyspnea on exertion and also has noticed episodes of significant shortness of breath while sleeping.  Upon further questioning, patient omits to loud snoring.  He wakes up gasping for breath and then apparently has a panic attack.  Sleep is nonrestorative.  He admits to daytime sleepiness.  He has never been evaluated for sleep apnea.  He was recently seen by Dr. anger and his ECG was reported to show sinus bradycardia with heart rates in the low 40s.  He denies any presyncope or syncope.  He is not very active.  He has morbid obesity.  He also has a significant history for psoriasis.  Laboratory in December 2018 by Dr.Ehinger had shown mild renal insufficiency with a creatinine of 1.50.  Cholesterol was 119 with an LDL of 64.  H  When I initially saw him, his ECG suggested probable A-V dissociation with an atrial rate at approximately 83 bpm variable ventricular rate in the 60s to 70s with right bundle branch block morphology and left axis deviation.  He was continuing to have shortness of breath and I further titrated furosemide to 40 mg.  His metoprolol had been stopped prior to his office visit with me.  I was very concerned that he most likely had  obstructive sleep apnea which could also contribute to nocturnal bradycardic arrhythmias.  I scheduled him for a sleep study which revealed at least moderate sleep apnea with an AHI of 24.6 on the diagnostic portion of the study; however, the patient was unable to achieve any rim sleep during this segment of the test.  CPAP was initiated and titrated up to 18 cm water pressure.  Despite this pressure.  He continued have significant oxygen desaturation to a nadir of 77%.  I had recommended BiPAP titration giving his suboptimal CPAP titration evaluation but this has not yet been done.  He also wore a monitor which showed periods of time.  His rhythm with first-degree AV block, and PACs, second-degree AV block, and periods of 2-1 AV block, and apparent A-V dissociation.  The patient was asymptomatic.  A 2-D echo Doppler study showed an EF of 50-55%.  There was a thickened aortic valve with mild aortic stenosis with a peak gradient of 21 and mean gradient of 12.  There was mitral annular calcification.  There was moderate biatrial enlargement.  Acoustical window was poor.    When I saw him in June 2019 his ECG suggested A-V dissociation.  He was referred to Dr. 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  He underwent pacemaker implantation on June 19 with a [facility] Jude device.  He  last saw Dr. Caryl Comes in September 2019 in follow-up of his pacemaker implant.  He has felt improved.  He did not tolerate CPAP and I had recommended BiPAP titration study.  Apparently he has a machine but has not been using treatment since initially he was concerned about high pressures.  His peak weight was 382 and had reduced to 342 and recently has gained some weight back to 354.  He presents for evaluation.  Past Medical History:  Diagnosis Date  . CKD (chronic kidney disease)   . DM type 2 (diabetes mellitus, type 2) ([place])   . ED  (erectile dysfunction)   . Fatty liver   . GERD (gastroesophageal reflux disease)   . Gout   . H/O adenomatous polyp of colon   . HLD (hyperlipidemia)   . HTN (hypertension)   . Hypothyroid   . Panic attacks   . Psoriasis     Past medical history is notable for type 2 diabetes mellitus, hyperlipidemia, hypothyroidism, hypertension, obesity, psoriasis, GERD, erectile dysfunction, chronic kidney disease.  Current Medications: Outpatient Medications Prior to Visit  Medication Sig Dispense Refill  . ALPRAZolam (XANAX) 1 MG tablet Take 0.5-1 mg by mouth daily as needed for anxiety.     Marland Kitchen aspirin 81 MG EC tablet Take 81 mg by mouth daily.     Marland Kitchen atorvastatin (LIPITOR) 40 MG tablet Take 20 mg by mouth daily.     . furosemide (LASIX) 40 MG tablet Take 1 tablet (40 mg total) by mouth every other day. 90 tablet 3  . levothyroxine (SYNTHROID, LEVOTHROID) 175 MCG tablet Take 175 mcg by mouth daily.     Marland Kitchen losartan (COZAAR) 50 MG tablet Take 1 tablet (50 mg total) by mouth daily.    . metFORMIN (GLUCOPHAGE) 1000 MG tablet Take 1,000 mg by mouth 2 (two) times daily with a meal.     . PARoxetine (PAXIL) 40 MG tablet Take 40 mg by mouth daily.    [name] Glycol-Propyl Glycol (SYSTANE ULTRA) 0.4-0.3 % SOLN Place 1 drop into both eyes daily as needed (dry eyes).     No facility-administered medications prior to visit.      Allergies:   Hctz [hydrochlorothiazide]; Lexapro [escitalopram oxalate]; and Lisinopril   Social History   Socioeconomic History  . Marital status: Married    Spouse name: Not on file  . Number of children: Not on file  . Years of education: Not on file  . Highest education level: Not on file  Occupational History  . Not on file  Social Needs  . Financial resource strain: Not on file  . Food insecurity:    Worry: Not on file    Inability: Not on file  . Transportation needs:    Medical: Not on file    Non-medical: Not on file  Tobacco Use  . Smoking status: Never  Smoker  . Smokeless tobacco: Never Used  Substance and Sexual Activity  . Alcohol use: No    Alcohol/week: 0.0 standard drinks  . Drug use: No  . Sexual activity: Not on file  Lifestyle  . Physical activity:    Days per week: Not on file    Minutes per session: Not on file  . Stress: Not on file  Relationships  . Social connections:    Talks on phone: Not on file    Gets together: Not on file    Attends religious service: Not on file    Active member of club or organization: Not  on file    Attends meetings of clubs or organizations: Not on file    Relationship status: Not on file  Other Topics Concern  . Not on file  Social History Narrative  . Not on file    Additional social history is notable that he is married 62 years.  He has 4 children, 9 grandchildren, and 4 great-grandchildren and 5 great great-grandchildren.  He completed 2 years of college.  He is retired from [employer].  There is no tobacco use.  He does not exercise.  Family History:  Family history is notable that his mother died at age 70.  She never recovered after she broke her hip.  Her father died at age 29 and had emphysema.  One brother died of an MI at age 71.  Another brother died at age 60 and had methicillin-resistant staph infection.  One sister died at age 2 with cancer, another sister had lung cancer, and one sister is living at 62.  Patient's 4 children   ROS General: Negative; No fevers, chills, or night sweats;  HEENT: Negative; No changes in vision or hearing, sinus congestion, difficulty swallowing Pulmonary: Positive for shortness of breath and wheezing Cardiovascular: see HPI GI: Negative; No nausea, vomiting, diarrhea, or abdominal pain GU: Negative; No dysuria, hematuria, or difficulty voiding Musculoskeletal: History of gout Hematologic/Oncology: Negative; no easy bruising, bleeding Endocrine: Positive for hypothyroidism and type 2 diabetes mellitus Neuro: Negative; no changes in balance,  headaches Skin: Negative; No rashes or skin lesions Psychiatric: Positive for anxiety Sleep: Positive for OSA with snoring, daytime sleepiness; no bruxism, restless legs, hypnogognic hallucinations, no cataplexy Other comprehensive 14 point system review is negative.   PHYSICAL EXAM:   VS:  BP 136/85   Pulse 87   Ht 6' (1.829 m)   Wt (!) 354 lb 6.4 oz (160.8 kg)   BMI 48.07 kg/m     Repeat blood pressure by me 122/78  Wt Readings from Last 3 Encounters:  06/11/18 (!) 354 lb 6.4 oz (160.8 kg)  02/23/18 (!) 346 lb 3.2 oz (157 kg)  11/24/17 (!) 340 lb (154.2 kg)    General: Alert, oriented, no distress.  Skin: normal turgor, no rashes, warm and dry HEENT: Normocephalic, atraumatic. Pupils equal round and reactive to light; sclera anicteric; extraocular muscles intact;  Nose without nasal septal hypertrophy Mouth/Parynx benign; Mallinpatti scale 4 Neck: No JVD, no carotid bruits; normal carotid upstroke Lungs: clear to ausculatation and percussion; no wheezing or rales Chest wall: without tenderness to palpitation Heart: PMI not displaced, RRR, s1 s2 normal, 1/6 systolic murmur, no diastolic murmur, no rubs, gallops, thrills, or heaves Abdomen: Significant central adiposity; soft, nontender; no hepatosplenomehaly, BS+; abdominal aorta nontender and not dilated by palpation. Back: no CVA tenderness Pulses 2+ Musculoskeletal: full range of motion, normal strength, no joint deformities Extremities: Trace bilateral ankle edema no clubbing cyanosis, Homan's sign negative  Neurologic: grossly nonfocal; Cranial nerves grossly wnl Psychologic: Normal mood and affect   Studies/Labs Reviewed:   EKG:  EKG is ordered today.  ECG (independently read by me): Atrially sensing ventricular paced rhythm at 87 bpm  November 12, 2017 ECG (independently read by me): Abnormal A-V dissociation with an atrial rate at L5 ventricular rate at 64.  Right bundle branch block morphology.    July 14, 2017  ECG (independently read by me): Probable A-V dissociation with atrial rate approximatel 83 bpm.  Ventricular rate is variable in the 60s to 70 range with right bundle branch block  morphology.  Left axis deviation.  Recent Labs: BMP Latest Ref Rng & Units 02/23/2018 11/19/2017  Glucose 65 - 99 mg/dL 116(H) 94  BUN 8 - 27 mg/dL 29(H) 26  Creatinine 0.76 - 1.27 mg/dL 1.88(H) 1.84(H)  BUN/Creat Ratio 10 - 24 15 14   Sodium 134 - 144 mmol/L 138 143  Potassium 3.5 - 5.2 mmol/L 4.9 5.1  Chloride 96 - 106 mmol/L 99 94(L)  CO2 20 - 29 mmol/L 21 24  Calcium 8.6 - 10.2 mg/dL 9.8 9.7     No flowsheet data found.  CBC Latest Ref Rng & Units 11/19/2017  WBC 3.4 - 10.8 x10E3/uL 6.8  Hemoglobin 13.0 - 17.7 g/dL 13.4  Hematocrit 37.5 - 51.0 % 40.6  Platelets 150 - 450 x10E3/uL 197   Lab Results  Component Value Date   MCV 86 11/19/2017   No results found for: TSH No results found for: HGBA1C   BNP No results found for: BNP  ProBNP No results found for: PROBNP   Lipid Panel  No results found for: CHOL, TRIG, HDL, CHOLHDL, VLDL, LDLCALC, LDLDIRECT   RADIOLOGY: No results found.   Additional studies/ records that were reviewed today include:  I reviewed the office records from Dr. [name]; sleep study, echo Doppler study, event monitor.  I reviewed the records of Dr. Caryl Comes regarding his pacemaker implantation and subsequent office visit   ------------------------------------------------------------------- July 17, 2017  ECHO study Conclusions  - Left ventricle: Systolic function was normal. The estimated   ejection fraction was in the range of 50% to 55%. - Aortic valve: AV is thickened, calcified with restricted motion   Peak and mean gradients through the valve are 21 and 12 mm Hg   respectively consistent with mild AS. - Mitral valve: Calcified annulus. Mildly thickened leaflets . - Left atrium: The atrium was moderately dilated. - Right ventricle: RVEF is  difficult to assess even with use of   Definity Overall appears moderately depressed. The cavity size   was mildly dilated. - Right atrium: The atrium was moderately dilated.  Impressions:  - Poor acoustic windows limit study.  ASSESSMENT:    1. Hypertension, unspecified type   2. Cardiac pacemaker in situ   3. Heart block AV second degree   4. Atrioventricular (AV) dissociation   5. Mild aortic stenosis   6. Obstructive sleep apnea   7. Morbid obesity ([place])   8. Hypothyroidism, unspecified type     PLAN:  [name] is a 78 year old gentleman who has a history of morbid obesity, type 2 diabetes mellitus, hypothyroidism, hyperlipidemia, psoriasis, who had developed exertional dyspnea and was previously felt to have frequent panic attacks.  An echo Doppler study in February 2019 revealed low normal systolic function with an EF of 50 to 55%, mild aortic stenosis, mitral annular calcification, and biatrial enlargement.  He subsequently was found to develop complete heart block and has since undergone permanent pacemaker implantation by Dr. Caryl Comes on November 19, 2017.  He has felt improved since pacemaker implantation.  His ECG today shows an atrially sensed and ventricular paced rhythm with a ventricular rate at 87.  His blood pressure today is mildly increased based on new hypertensive guidelines.  He has been taking furosemide 40 mg every other day and losartan 50 mg daily.  With his residual ankle swelling I have suggested he increase furosemide to 40 mg alternating with 20 mg every other day and continue at present the current dose of losartan.  He has  a history of hypothyroidism and is on levothyroxine at 175 mcg.  He is diabetic on metformin 1000 mg twice a day.  Most recent hemoglobin A1c was 5.7.  He is on atorvastatin 40 mg for hyperlipidemia most recent lipid studies in December 2019 were excellent with total cholesterol 133, HDL 40, LDL 62, and triglycerides were 152.Marland Kitchen  He has  significant obstructive sleep apnea and had suboptimal titration with CPAP.  He subsequently was initiated on BiPAP therapy.  He apparently was not utilizing his machine adequately and only very short duration.  I am making adjustments to his BiPAP parameters and will reduce his ramp time to 20 minutes and start his ramp at 6 cm water pressure.  I will change his minimum EPAP pressure to 8 with a pressure support of 4 with a potential maximum IPAP pressure of 25.  He has a Microbiologist F 30 mask.  His DME company is [employer].  I have recommended a download be obtained in 30 days.  I again discussed the adverse cardiovascular consequences of untreated sleep apnea.  He continues to be morbidly obese.  We discussed weight loss and increased exercise.  I will see him in 3 months for reevaluation or sooner if problems arise.    Medication Adjustments/Labs and Tests Ordered: Current medicines are reviewed at length with the patient today.  Concerns regarding medicines are outlined above.  Medication changes, Labs and Tests ordered today are listed in the Patient Instructions below. Patient Instructions  Medication Instructions:  The current medical regimen is effective;  continue present plan and medications.  If you need a refill on your cardiac medications before your next appointment, please call your pharmacy.    Follow-Up: At [facility], you and your health needs are our priority.  As part of our continuing mission to provide you with exceptional heart care, we have created designated Provider Care Teams.  These Care Teams include your primary Cardiologist (physician) and Advanced Practice Providers (APPs -  Physician Assistants and Nurse Practitioners) who all work together to provide you with the care you need, when you need it. You will need a follow up appointment in 3 months.  Please call our office 2 months in advance to schedule this appointment.  You may see Dr.Dinna Severs or one of the following  Advanced Practice Providers on your designated Care Team: Almyra Deforest, [place] . Fabian Sharp, PA-C  Any Other Special Instructions Will Be Listed Below (If Applicable). Changed settings on BIPAP. Will send in order for the new mask.       Signed, Shelva Majestic, MD  06/13/2018 12:18 PM    [facility] [address] [place], [place], [place]  [address] Phone: [phone]

## 2018-06-11 NOTE — Patient Instructions (Signed)
Medication Instructions:  The current medical regimen is effective;  continue present plan and medications.  If you need a refill on your cardiac medications before your next appointment, please call your pharmacy.    Follow-Up: At [facility], you and your health needs are our priority.  As part of our continuing mission to provide you with exceptional heart care, we have created designated Provider Care Teams.  These Care Teams include your primary Cardiologist (physician) and Advanced Practice Providers (APPs -  Physician Assistants and Nurse Practitioners) who all work together to provide you with the care you need, when you need it. You will need a follow up appointment in 3 months.  Please call our office 2 months in advance to schedule this appointment.  You may see Dr.Kelly or one of the following Advanced Practice Providers on your designated Care Team: Almyra Deforest, [place] . Fabian Sharp, PA-C  Any Other Special Instructions Will Be Listed Below (If Applicable). Changed settings on BIPAP. Will send in order for the new mask.

## 2018-06-11 NOTE — Telephone Encounter (Signed)
Faxed order for ResMed Airfit 30 mask to [facility] per VO/Dr Claiborne Billings.

## 2018-06-11 NOTE — Telephone Encounter (Signed)
-----   Message from Caprice Beaver, LPN sent at 0/02/3817  3:32 PM EST ----- Dr.Kelly would like an order for this patient to have a new BIPAP mask.  ResMed Airfit F30.   Thank you!

## 2018-06-13 ENCOUNTER — Encounter: Payer: Self-pay | Admitting: Cardiovascular Disease

## 2018-06-17 DIAGNOSIS — J069 Acute upper respiratory infection, unspecified: Secondary | ICD-10-CM | POA: Diagnosis not present

## 2018-06-22 ENCOUNTER — Other Ambulatory Visit: Payer: Self-pay | Admitting: Cardiovascular Disease

## 2018-06-22 MED ORDER — FUROSEMIDE 40 MG PO TABS: 40.0000 mg | ORAL_TABLET | ORAL | 2 refills | Status: DC

## 2018-06-22 NOTE — Telephone Encounter (Signed)
Rx has been sent to the pharmacy electronically. ° °

## 2018-06-22 NOTE — Telephone Encounter (Signed)
°*  STAT* If patient is at the pharmacy, call can be transferred to refill team.   1. Which medications need to be refilled? (please list name of each medication and dose if known) Furosemide  2. Which pharmacy/location (including street and city if local pharmacy) is medication to be sent to?[facility]  3. Do they need a 30 day or 90 day supply? 90 and refills

## 2018-06-23 DIAGNOSIS — H18413 Arcus senilis, bilateral: Secondary | ICD-10-CM | POA: Diagnosis not present

## 2018-06-23 DIAGNOSIS — H25013 Cortical age-related cataract, bilateral: Secondary | ICD-10-CM | POA: Diagnosis not present

## 2018-06-23 DIAGNOSIS — H02831 Dermatochalasis of right upper eyelid: Secondary | ICD-10-CM | POA: Diagnosis not present

## 2018-06-23 DIAGNOSIS — H2513 Age-related nuclear cataract, bilateral: Secondary | ICD-10-CM | POA: Diagnosis not present

## 2018-06-23 DIAGNOSIS — H2511 Age-related nuclear cataract, right eye: Secondary | ICD-10-CM | POA: Diagnosis not present

## 2018-06-23 DIAGNOSIS — H25043 Posterior subcapsular polar age-related cataract, bilateral: Secondary | ICD-10-CM | POA: Diagnosis not present

## 2018-07-04 DIAGNOSIS — G4733 Obstructive sleep apnea (adult) (pediatric): Secondary | ICD-10-CM | POA: Diagnosis not present

## 2018-07-09 DIAGNOSIS — G4733 Obstructive sleep apnea (adult) (pediatric): Secondary | ICD-10-CM | POA: Diagnosis not present

## 2018-07-13 ENCOUNTER — Telehealth: Payer: Self-pay | Admitting: *Deleted

## 2018-07-13 NOTE — Telephone Encounter (Signed)
   [facility] Pre-operative Risk Assessment    Request for surgical clearance:  1. What type of surgery is being performed? CATARACT EXTRACTION W/INTRAOCULAR LENS IMPLANT OF THE RIGHT EYE. FOLLOWED BY THE LEFT EYE   2. When is this surgery scheduled? 07/27/18   3. What type of clearance is required (medical clearance vs. Pharmacy clearance to hold med vs. Both)? MEDICAL  4. Are there any medications that need to be held prior to surgery and how long?PER DR. Christia Reading BEVIS PT DOES NOT NEED TO HOLD ANY MEDICATIONS   5. Practice name and name of physician performing surgery? [facility] ; DR. TIMOTHY BEVIS  6. What is your office phone number [phone]    7.   What is your office fax number [phone]  8.   Anesthesia type (None, local, MAC, general) ? TOPICAL ANESTHESIA WITH IV MEDICATION   Julaine Hua 07/13/2018, 2:23 PM  _________________________________________________________________   (provider comments below)

## 2018-07-15 NOTE — Telephone Encounter (Signed)
   Primary Cardiologist: Shelva Majestic, MD  Chart reviewed as part of pre-operative protocol coverage. Cataract extractions are recognized in guidelines as low risk surgeries that do not typically require specific preoperative testing or holding of blood thinner therapy. Therefore, given past medical history and time since last visit, based on ACC/AHA guidelines, [name] would be at acceptable risk for the planned procedure without further cardiovascular testing.   I will route this recommendation to the requesting party via Epic fax function and remove from pre-op pool.  Please call with questions. Patient has a Engineer, petroleum, avoid placing electrical cord over the pacemaker during procedure to minimize electronic interference.   [place], [place] 07/15/2018, 1:03 PM

## 2018-07-27 DIAGNOSIS — H2512 Age-related nuclear cataract, left eye: Secondary | ICD-10-CM | POA: Diagnosis not present

## 2018-07-27 DIAGNOSIS — H2511 Age-related nuclear cataract, right eye: Secondary | ICD-10-CM | POA: Diagnosis not present

## 2018-08-02 DIAGNOSIS — G4733 Obstructive sleep apnea (adult) (pediatric): Secondary | ICD-10-CM | POA: Diagnosis not present

## 2018-08-03 DIAGNOSIS — H2511 Age-related nuclear cataract, right eye: Secondary | ICD-10-CM | POA: Diagnosis not present

## 2018-08-10 DIAGNOSIS — H2512 Age-related nuclear cataract, left eye: Secondary | ICD-10-CM | POA: Diagnosis not present

## 2018-08-24 ENCOUNTER — Ambulatory Visit (INDEPENDENT_AMBULATORY_CARE_PROVIDER_SITE_OTHER): Payer: Medicare HMO | Admitting: *Deleted

## 2018-08-24 ENCOUNTER — Other Ambulatory Visit: Payer: Self-pay

## 2018-08-24 DIAGNOSIS — I441 Atrioventricular block, second degree: Secondary | ICD-10-CM

## 2018-08-25 LAB — CUP PACEART REMOTE DEVICE CHECK
Battery Remaining Longevity: 102 mo
Battery Remaining Percentage: 95.5 %
Battery Voltage: 3.02 V
Brady Statistic AP VP Percent: 1.2 %
Brady Statistic AP VS Percent: 1 %
Brady Statistic AS VP Percent: 98 %
Brady Statistic AS VS Percent: 1 %
Brady Statistic RA Percent Paced: 1 %
Brady Statistic RV Percent Paced: 99 %
Date Time Interrogation Session: 20200323060018
Implantable Lead Implant Date: 20190624
Implantable Lead Implant Date: 20190624
Implantable Lead Location: 753859
Implantable Lead Location: 753860
Implantable Lead Model: 5076
Implantable Lead Model: 5076
Implantable Pulse Generator Implant Date: 20190624
Lead Channel Impedance Value: 510 Ohm
Lead Channel Impedance Value: 510 Ohm
Lead Channel Pacing Threshold Amplitude: 0.75 V
Lead Channel Pacing Threshold Amplitude: 0.75 V
Lead Channel Pacing Threshold Pulse Width: 0.5 ms
Lead Channel Pacing Threshold Pulse Width: 0.5 ms
Lead Channel Sensing Intrinsic Amplitude: 5 mV
Lead Channel Sensing Intrinsic Amplitude: 6.5 mV
Lead Channel Setting Pacing Amplitude: 2 V
Lead Channel Setting Pacing Amplitude: 2.5 V
Lead Channel Setting Pacing Pulse Width: 0.5 ms
Lead Channel Setting Sensing Sensitivity: 2 mV
Pulse Gen Model: 2272
Pulse Gen Serial Number: 9033095

## 2018-09-02 DIAGNOSIS — G4733 Obstructive sleep apnea (adult) (pediatric): Secondary | ICD-10-CM | POA: Diagnosis not present

## 2018-09-02 NOTE — Addendum Note (Signed)
Addended by: Douglass Rivers D on: 09/02/2018 12:30 PM   Modules accepted: Level of Service

## 2018-09-02 NOTE — Progress Notes (Signed)
Remote pacemaker transmission.   

## 2018-09-16 ENCOUNTER — Ambulatory Visit: Payer: Medicare HMO | Admitting: Cardiovascular Disease

## 2018-10-02 DIAGNOSIS — G4733 Obstructive sleep apnea (adult) (pediatric): Secondary | ICD-10-CM | POA: Diagnosis not present

## 2018-11-20 ENCOUNTER — Encounter: Payer: Medicare HMO | Admitting: Internal Medicine

## 2018-11-23 ENCOUNTER — Ambulatory Visit (INDEPENDENT_AMBULATORY_CARE_PROVIDER_SITE_OTHER): Payer: Medicare HMO | Admitting: *Deleted

## 2018-11-23 DIAGNOSIS — I441 Atrioventricular block, second degree: Secondary | ICD-10-CM

## 2018-11-23 LAB — CUP PACEART REMOTE DEVICE CHECK
Battery Remaining Longevity: 109 mo
Battery Remaining Percentage: 95.5 %
Battery Voltage: 3.02 V
Brady Statistic AP VP Percent: 1.3 %
Brady Statistic AP VS Percent: 1 %
Brady Statistic AS VP Percent: 98 %
Brady Statistic AS VS Percent: 1 %
Brady Statistic RA Percent Paced: 1.1 %
Brady Statistic RV Percent Paced: 99 %
Date Time Interrogation Session: 20200622060012
Implantable Lead Implant Date: 20190624
Implantable Lead Implant Date: 20190624
Implantable Lead Location: 753859
Implantable Lead Location: 753860
Implantable Lead Model: 5076
Implantable Lead Model: 5076
Implantable Pulse Generator Implant Date: 20190624
Lead Channel Impedance Value: 530 Ohm
Lead Channel Impedance Value: 540 Ohm
Lead Channel Pacing Threshold Amplitude: 0.75 V
Lead Channel Pacing Threshold Amplitude: 0.75 V
Lead Channel Pacing Threshold Pulse Width: 0.5 ms
Lead Channel Pacing Threshold Pulse Width: 0.5 ms
Lead Channel Sensing Intrinsic Amplitude: 5 mV
Lead Channel Sensing Intrinsic Amplitude: 6.5 mV
Lead Channel Setting Pacing Amplitude: 2 V
Lead Channel Setting Pacing Amplitude: 2.5 V
Lead Channel Setting Pacing Pulse Width: 0.5 ms
Lead Channel Setting Sensing Sensitivity: 2 mV
Pulse Gen Model: 2272
Pulse Gen Serial Number: 9033095

## 2018-12-03 NOTE — Progress Notes (Signed)
Remote pacemaker transmission.   

## 2018-12-09 DIAGNOSIS — F411 Generalized anxiety disorder: Secondary | ICD-10-CM | POA: Diagnosis not present

## 2018-12-09 DIAGNOSIS — I129 Hypertensive chronic kidney disease with stage 1 through stage 4 chronic kidney disease, or unspecified chronic kidney disease: Secondary | ICD-10-CM | POA: Diagnosis not present

## 2018-12-09 DIAGNOSIS — F39 Unspecified mood [affective] disorder: Secondary | ICD-10-CM | POA: Diagnosis not present

## 2018-12-09 DIAGNOSIS — E1122 Type 2 diabetes mellitus with diabetic chronic kidney disease: Secondary | ICD-10-CM | POA: Diagnosis not present

## 2018-12-09 DIAGNOSIS — E7800 Pure hypercholesterolemia, unspecified: Secondary | ICD-10-CM | POA: Diagnosis not present

## 2018-12-09 DIAGNOSIS — E039 Hypothyroidism, unspecified: Secondary | ICD-10-CM | POA: Diagnosis not present

## 2018-12-09 DIAGNOSIS — E1129 Type 2 diabetes mellitus with other diabetic kidney complication: Secondary | ICD-10-CM | POA: Diagnosis not present

## 2018-12-09 DIAGNOSIS — N400 Enlarged prostate without lower urinary tract symptoms: Secondary | ICD-10-CM | POA: Diagnosis not present

## 2018-12-09 DIAGNOSIS — N183 Chronic kidney disease, stage 3 (moderate): Secondary | ICD-10-CM | POA: Diagnosis not present

## 2018-12-09 DIAGNOSIS — Z6841 Body Mass Index (BMI) 40.0 and over, adult: Secondary | ICD-10-CM | POA: Diagnosis not present

## 2018-12-09 DIAGNOSIS — Z0000 Encounter for general adult medical examination without abnormal findings: Secondary | ICD-10-CM | POA: Diagnosis not present

## 2018-12-10 DIAGNOSIS — E1129 Type 2 diabetes mellitus with other diabetic kidney complication: Secondary | ICD-10-CM | POA: Diagnosis not present

## 2018-12-10 DIAGNOSIS — I129 Hypertensive chronic kidney disease with stage 1 through stage 4 chronic kidney disease, or unspecified chronic kidney disease: Secondary | ICD-10-CM | POA: Diagnosis not present

## 2018-12-10 DIAGNOSIS — E7800 Pure hypercholesterolemia, unspecified: Secondary | ICD-10-CM | POA: Diagnosis not present

## 2018-12-10 DIAGNOSIS — E039 Hypothyroidism, unspecified: Secondary | ICD-10-CM | POA: Diagnosis not present

## 2019-01-06 ENCOUNTER — Telehealth: Payer: Self-pay | Admitting: *Deleted

## 2019-01-06 DIAGNOSIS — I480 Paroxysmal atrial fibrillation: Secondary | ICD-10-CM

## 2019-01-06 NOTE — Telephone Encounter (Signed)
Received Merlin alert for increasing AT/AF burden over recent weeks. Not currently on anticoagulation due to sub-clinical AF burden/episode duration per previous notes from Dr. Caryl Comes. Longest episode ~17.5hrs duration on 7/31 (no EGM available), longest recent AF episode with EGM was 1.5hr duration on 8/4. Many EGMs show frequent PACs, not true AF, so burden may be overestimated.   Routed to Dr. Caryl Comes to confirm no new recommendations.

## 2019-01-10 NOTE — Telephone Encounter (Signed)
Clear change in atrial ectopy] Lets get BMETand Mg, and TSH  O/w we will continue to follow  Raquel Sarna Thanks SK

## 2019-01-13 NOTE — Telephone Encounter (Signed)
Spoke with patient. He has an upcoming appointment with Dr. Caryl Comes on 01/18/19 at 3:15pm. Prefers to get lab work done that same day prior to his visit. Orders entered, added lab appointment for 01/18/19 at 3:00pm. Pt denies questions at this time and thanked me for my call.

## 2019-01-18 ENCOUNTER — Other Ambulatory Visit: Payer: Self-pay

## 2019-01-18 ENCOUNTER — Encounter: Payer: Self-pay | Admitting: Internal Medicine

## 2019-01-18 ENCOUNTER — Encounter (INDEPENDENT_AMBULATORY_CARE_PROVIDER_SITE_OTHER): Payer: Self-pay

## 2019-01-18 ENCOUNTER — Other Ambulatory Visit: Payer: Medicare HMO | Admitting: *Deleted

## 2019-01-18 ENCOUNTER — Ambulatory Visit (INDEPENDENT_AMBULATORY_CARE_PROVIDER_SITE_OTHER): Payer: Medicare HMO | Admitting: Internal Medicine

## 2019-01-18 VITALS — BP 120/78 | HR 87 | Ht 72.0 in | Wt 356.6 lb

## 2019-01-18 DIAGNOSIS — I480 Paroxysmal atrial fibrillation: Secondary | ICD-10-CM | POA: Diagnosis not present

## 2019-01-18 DIAGNOSIS — Z950 Presence of cardiac pacemaker: Secondary | ICD-10-CM

## 2019-01-18 DIAGNOSIS — I4589 Other specified conduction disorders: Secondary | ICD-10-CM | POA: Diagnosis not present

## 2019-01-18 DIAGNOSIS — I441 Atrioventricular block, second degree: Secondary | ICD-10-CM | POA: Diagnosis not present

## 2019-01-18 DIAGNOSIS — I2781 Cor pulmonale (chronic): Secondary | ICD-10-CM | POA: Diagnosis not present

## 2019-01-18 DIAGNOSIS — I272 Pulmonary hypertension, unspecified: Secondary | ICD-10-CM | POA: Diagnosis not present

## 2019-01-18 DIAGNOSIS — Z6841 Body Mass Index (BMI) 40.0 and over, adult: Secondary | ICD-10-CM | POA: Diagnosis not present

## 2019-01-18 DIAGNOSIS — I442 Atrioventricular block, complete: Secondary | ICD-10-CM | POA: Diagnosis not present

## 2019-01-18 DIAGNOSIS — I50810 Right heart failure, unspecified: Secondary | ICD-10-CM | POA: Diagnosis not present

## 2019-01-18 DIAGNOSIS — I4891 Unspecified atrial fibrillation: Secondary | ICD-10-CM | POA: Diagnosis not present

## 2019-01-18 LAB — CUP PACEART INCLINIC DEVICE CHECK
Battery Remaining Longevity: 110 mo
Battery Voltage: 3.02 V
Brady Statistic RA Percent Paced: 1.6 %
Brady Statistic RV Percent Paced: 99.69 %
Date Time Interrogation Session: 20200817172504
Implantable Lead Implant Date: 20190624
Implantable Lead Implant Date: 20190624
Implantable Lead Location: 753859
Implantable Lead Location: 753860
Implantable Lead Model: 5076
Implantable Lead Model: 5076
Implantable Pulse Generator Implant Date: 20190624
Lead Channel Impedance Value: 525 Ohm
Lead Channel Impedance Value: 562.5 Ohm
Lead Channel Pacing Threshold Amplitude: 0.75 V
Lead Channel Pacing Threshold Amplitude: 0.75 V
Lead Channel Pacing Threshold Pulse Width: 0.5 ms
Lead Channel Pacing Threshold Pulse Width: 0.5 ms
Lead Channel Sensing Intrinsic Amplitude: 5 mV
Lead Channel Sensing Intrinsic Amplitude: 6.6 mV
Lead Channel Setting Pacing Amplitude: 2 V
Lead Channel Setting Pacing Amplitude: 2.5 V
Lead Channel Setting Pacing Pulse Width: 0.5 ms
Lead Channel Setting Sensing Sensitivity: 2 mV
Pulse Gen Model: 2272
Pulse Gen Serial Number: 9033095

## 2019-01-18 NOTE — Progress Notes (Signed)
Patient Care Team: [name], MD as PCP - General (Family Medicine) Troy Sine, MD as PCP - Cardiology (Cardiology)   HPI  [name] is a 78 y.o. 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  Date Cr K  12/18 1.6 4.3  6/19  1.84 5.1   He has untreated sleep apnea because of intolerance to CPAP  Energy level is still better following pacemaker although he remains quite inactive.  He is unable to walk 100 feet to and from his mailbox.  Weight continues to be an issue up another 10 pounds from last year  Mild edema; no chest pain.   Records and Results Reviewed   Past Medical History:  Diagnosis Date  . CKD (chronic kidney disease)   . DM type 2 (diabetes mellitus, type 2) ([place])   . ED (erectile dysfunction)   . Fatty liver   . GERD (gastroesophageal reflux disease)   . Gout   . H/O adenomatous polyp of colon   . HLD (hyperlipidemia)   . HTN (hypertension)   . Hypothyroid   . Panic attacks   . Psoriasis     Past Surgical History:  Procedure Laterality Date  . PACEMAKER IMPLANT N/A 11/24/2017   Procedure: PACEMAKER IMPLANT;  Surgeon: Deboraha Sprang, MD;  Location: [facility];  Service: Cardiovascular;  Laterality: N/A;    Current Meds  Medication Sig  . ALPRAZolam (XANAX) 1 MG tablet Take 0.5-1 mg by mouth daily as needed for anxiety.   Marland Kitchen aspirin 81 MG EC tablet Take 81 mg by mouth daily.   Marland Kitchen atorvastatin (LIPITOR) 40 MG tablet Take 20 mg by mouth daily.   . furosemide (LASIX) 40 MG tablet Take 1 tablet (40 mg total) by mouth every other day.  . levothyroxine (SYNTHROID, LEVOTHROID) 175 MCG tablet Take 175 mcg by mouth daily.   Marland Kitchen losartan (COZAAR) 50 MG tablet Take 1 tablet (50 mg total) by mouth daily.  . metFORMIN (GLUCOPHAGE) 1000 MG tablet Take 1,000 mg by mouth 2 (two) times daily with a meal.   . PARoxetine (PAXIL) 40  MG tablet Take 40 mg by mouth daily.  [name] Glycol-Propyl Glycol (SYSTANE ULTRA) 0.4-0.3 % SOLN Place 1 drop into both eyes daily as needed (dry eyes).  . silodosin (RAPAFLO) 8 MG CAPS capsule Take 8 mg by mouth as directed. 1 tablet every 3 days    Allergies  Allergen Reactions  . Hctz [Hydrochlorothiazide]     Gout flare   . Lexapro [Escitalopram Oxalate]     Ineffective for anxiety   . Lisinopril     angioedema       Review of Systems negative except from HPI and PMH  Physical Exam    BP 120/78   Pulse 87   Ht 6' (1.829 m)   Wt (!) 356 lb 9.6 oz (161.8 kg)   SpO2 98%   BMI 48.36 kg/m  Well developed and Morbidly obese in no acute distress HENT normal Neck supple  Clear Device pocket well healed; without hematoma or erythema.  There is no tethering  Regular rate and rhythm, no  gallop No  murmur Abd-soft with active BS No Clubbing cyanosis 1+ edema Skin-warm and dry A & Oriented  Grossly normal sensory and motor function  ECG  P-synchronous/ AV  Pacing @ 87 20/17/46 New QRS lead 1  and upright QRS V1 Unchanged from 1/20 Personally reviewed       Assessment and  Plan  Right bundle branch block left anterior fascicular block  Complete heart block    Morbid obesity  Pacemaker St Jude  2019   The patient's device was interrogated.  The information was reviewed. No changes were made in the programming.     Obstructive sleep apnea  Pulmonary hypertension/cor pulmonale/right heart failure  Atrial fibrillation  SCAF     Pt with SCAF-- I did a quick literature search to ask whether cor pulmonale and RV failure would change the rubric for thinking about anticoagulation with SCAF-- could not find anything specific, but many pts w Pul HTN are anticoagulation, albeit apparently with little data b/c concerns re RA thrombi and PE as well as LA thromboembolism and stroke-- have reached out to colleagues  Lengthy discussion re the importance of weight  loss and exercise and recommended [place] and graduated exercise program in his driveway; also to get up and get his own water and snacks   We spent more than 50% of our >25 min visit in face to face counseling regarding the above

## 2019-01-18 NOTE — Patient Instructions (Signed)

## 2019-01-19 LAB — BASIC METABOLIC PANEL
BUN/Creatinine Ratio: 15 (ref 10–24)
BUN: 27 mg/dL (ref 8–27)
CO2: 25 mmol/L (ref 20–29)
Calcium: 9.4 mg/dL (ref 8.6–10.2)
Chloride: 100 mmol/L (ref 96–106)
Creatinine, Ser: 1.82 mg/dL — ABNORMAL HIGH (ref 0.76–1.27)
GFR calc Af Amer: 41 mL/min/{1.73_m2} — ABNORMAL LOW (ref >59)
GFR calc non Af Amer: 35 mL/min/{1.73_m2} — ABNORMAL LOW (ref >59)
Glucose: 92 mg/dL (ref 65–99)
Potassium: 4.6 mmol/L (ref 3.5–5.2)
Sodium: 141 mmol/L (ref 134–144)

## 2019-01-19 LAB — TSH: TSH: 0.951 u[IU]/mL (ref 0.450–4.500)

## 2019-01-19 LAB — MAGNESIUM: Magnesium: 1.5 mg/dL — ABNORMAL LOW (ref 1.6–2.3)

## 2019-01-26 ENCOUNTER — Telehealth: Payer: Self-pay | Admitting: *Deleted

## 2019-01-26 MED ORDER — MAGNESIUM OXIDE 400 (241.3 MG) MG PO TABS: 400.0000 mg | ORAL_TABLET | Freq: Every day | ORAL | 3 refills | Status: DC

## 2019-01-26 NOTE — Telephone Encounter (Signed)
Pt called back and asked why did he have to take the MagOx. I explained to him per Dr. Caryl Comes his magnesium level was a little low. I explained to the pt that this is a mineral in our body and needs to stay in a certain range to help our heart, kidney and other organs function properly. Pt thanked me for explaining this to him.

## 2019-01-26 NOTE — Telephone Encounter (Signed)
DPR ok to s/w pt's wife who has been notified of lab results by phone with verbal understanding. Pt's wife agreeable for pt to start MagOx 400 mg daily, Rx has been sent in to [facility]. I assured pt's wife once we have further instructions in regards to anticoagulation we will call back to discuss. Pt's wife thanked me for the call and my help. Patient notified of result.  Please refer to phone note from today for complete details.   Julaine Hua, [facility] 01/26/2019 8:34 AM

## 2019-01-26 NOTE — Telephone Encounter (Signed)
-----   Message from Dollene Primrose, RN sent at 01/25/2019  5:43 PM EDT -----  ----- Message ----- From: Deboraha Sprang, MD Sent: 01/23/2019   1:54 PM EDT To: Troy Sine, MD, Emily Filbert, RN, #  Please Inform Patient  Labs are normal x stable mild renal dysfunction and low MAG 1) please start MgOxide 400 mg daily 2) discussed with DrDB  And will forward to TK for his approval but tell pt that we think he should probably be on anticoagulation   TOM could you weigh in on anticoagulation if you feel strongly against   Thanks

## 2019-02-03 ENCOUNTER — Telehealth: Payer: Self-pay | Admitting: Internal Medicine

## 2019-02-03 NOTE — Telephone Encounter (Signed)
The patient was started on Magnesium 400 mg Daily 8/25, because of a Magnesium of 1.5.  The patient developed "hoarseness and chest tightness."  He stopped taking on 8/29 and he now feels much better, asymptomatic.  Please advise, thank you.

## 2019-02-03 NOTE — Telephone Encounter (Signed)
° ° °  Pt c/o medication issue:  1. Name of Medication: magnesium oxide (MAG-OX) 400 (241.3 Mg) MG tablet  2. How are you currently taking this medication (dosage and times per day)? n/a 3. Are you having a reaction (difficulty breathing--STAT)? Hoarse, chest tightness  4. What is your medication issue? Patient stopped taking medication 1 week ago, feels better

## 2019-02-05 ENCOUNTER — Other Ambulatory Visit: Payer: Self-pay

## 2019-02-05 ENCOUNTER — Telehealth: Payer: Self-pay | Admitting: Emergency Medicine

## 2019-02-05 DIAGNOSIS — I4891 Unspecified atrial fibrillation: Secondary | ICD-10-CM

## 2019-02-05 MED ORDER — APIXABAN 5 MG PO TABS: 5.0000 mg | ORAL_TABLET | Freq: Two times a day (BID) | ORAL | 5 refills | Status: DC

## 2019-02-05 MED ORDER — APIXABAN 2.5 MG PO TABS: 5.0000 mg | ORAL_TABLET | Freq: Every day | ORAL | Status: DC

## 2019-02-05 NOTE — Telephone Encounter (Signed)
Prescription refill request for Eliquis received.  Last office visit: Caryl Comes (01-18-2019) Scr: 1.82 (01-18-2019) Age: 78yo Weight: 161.8 kg  Prescription refill sent.

## 2019-02-05 NOTE — Telephone Encounter (Signed)
Dr Caryl Comes was notified of patient AF x 2 days and no [place]. Patient to start Eliquis 5 mg daily po due to creatinine of 1.82 and wt of 161 KG. ASA  81 mg po to be d/ced.  Patient notified of med change and Eliquis rx sent to pharmacy on record at patient request. Af clinic to contact patient with appointment for established care.

## 2019-02-05 NOTE — Telephone Encounter (Signed)
Alert received for ongoing AF since 02/03/19 @ 7:27 pm with highest v-rate of 97 bpm. Hx of AF . No [place]  Patient reports no CP, Chest pressure, SOB or edema. No report of palpitations. Discussed referral to AF clinic with patient. Will schedule for appointment next week with AF clinic.

## 2019-02-12 ENCOUNTER — Other Ambulatory Visit: Payer: Self-pay

## 2019-02-12 ENCOUNTER — Ambulatory Visit (HOSPITAL_COMMUNITY)
Admission: RE | Admit: 2019-02-12 | Discharge: 2019-02-12 | Disposition: A | Discharge status: Home / Self Care | Payer: Medicare HMO | Source: Ambulatory Visit | Attending: Physician Assistant | Admitting: Physician Assistant

## 2019-02-12 ENCOUNTER — Encounter (HOSPITAL_COMMUNITY): Payer: Self-pay | Admitting: Physician Assistant

## 2019-02-12 VITALS — BP 126/76 | HR 93 | Ht 72.0 in | Wt 343.4 lb

## 2019-02-12 DIAGNOSIS — E669 Obesity, unspecified: Secondary | ICD-10-CM | POA: Insufficient documentation

## 2019-02-12 DIAGNOSIS — E785 Hyperlipidemia, unspecified: Secondary | ICD-10-CM | POA: Diagnosis not present

## 2019-02-12 DIAGNOSIS — Z7984 Long term (current) use of oral hypoglycemic drugs: Secondary | ICD-10-CM | POA: Insufficient documentation

## 2019-02-12 DIAGNOSIS — Z6841 Body Mass Index (BMI) 40.0 and over, adult: Secondary | ICD-10-CM | POA: Diagnosis not present

## 2019-02-12 DIAGNOSIS — Z7901 Long term (current) use of anticoagulants: Secondary | ICD-10-CM | POA: Diagnosis not present

## 2019-02-12 DIAGNOSIS — E039 Hypothyroidism, unspecified: Secondary | ICD-10-CM | POA: Diagnosis not present

## 2019-02-12 DIAGNOSIS — E1122 Type 2 diabetes mellitus with diabetic chronic kidney disease: Secondary | ICD-10-CM | POA: Diagnosis not present

## 2019-02-12 DIAGNOSIS — F410 Panic disorder [episodic paroxysmal anxiety] without agoraphobia: Secondary | ICD-10-CM | POA: Insufficient documentation

## 2019-02-12 DIAGNOSIS — G4733 Obstructive sleep apnea (adult) (pediatric): Secondary | ICD-10-CM | POA: Diagnosis not present

## 2019-02-12 DIAGNOSIS — I129 Hypertensive chronic kidney disease with stage 1 through stage 4 chronic kidney disease, or unspecified chronic kidney disease: Secondary | ICD-10-CM | POA: Diagnosis not present

## 2019-02-12 DIAGNOSIS — Z888 Allergy status to other drugs, medicaments and biological substances status: Secondary | ICD-10-CM | POA: Insufficient documentation

## 2019-02-12 DIAGNOSIS — Z79899 Other long term (current) drug therapy: Secondary | ICD-10-CM | POA: Insufficient documentation

## 2019-02-12 DIAGNOSIS — Z950 Presence of cardiac pacemaker: Secondary | ICD-10-CM | POA: Insufficient documentation

## 2019-02-12 DIAGNOSIS — N189 Chronic kidney disease, unspecified: Secondary | ICD-10-CM | POA: Insufficient documentation

## 2019-02-12 DIAGNOSIS — I4891 Unspecified atrial fibrillation: Secondary | ICD-10-CM | POA: Diagnosis not present

## 2019-02-12 DIAGNOSIS — I480 Paroxysmal atrial fibrillation: Secondary | ICD-10-CM | POA: Diagnosis not present

## 2019-02-12 DIAGNOSIS — Z79890 Hormone replacement therapy (postmenopausal): Secondary | ICD-10-CM | POA: Insufficient documentation

## 2019-02-12 DIAGNOSIS — R9431 Abnormal electrocardiogram [ECG] [EKG]: Secondary | ICD-10-CM | POA: Diagnosis not present

## 2019-02-12 DIAGNOSIS — Z9119 Patient's noncompliance with other medical treatment and regimen: Secondary | ICD-10-CM | POA: Insufficient documentation

## 2019-02-12 NOTE — Progress Notes (Signed)
Primary Care Physician: [name], MD Primary Cardiologist: Dr Claiborne Billings Primary Electrophysiologist: Dr Caryl Comes Referring Physician: Dr [facility]ner[facility]   [name] is a 78 y.o. male with a history of DM, HLD, HTN, hypothyroid, CKD, CHB s/p PPM, and new onset atrial fibrillation who presents for follow up in the [facility]. The patient has had an increase in atrial ectopy noted on device interrogation and now true episodes of AF. He was started on Eliquis on 02/05/19. Patient was unaware of his arrhythmia during the episodes. He has a diagnosis of OSA but is not on CPAP therapy. He denies any significant alcohol use.   Today, he denies symptoms of palpitations, chest pain, shortness of breath, orthopnea, PND, lower extremity edema, dizziness, presyncope, syncope, snoring, daytime somnolence, bleeding, or neurologic sequela. The patient is tolerating medications without difficulties and is otherwise without complaint today.    Atrial Fibrillation Risk Factors:  he does have symptoms or diagnosis of sleep apnea. he is not compliant with CPAP therapy. he does not have a history of rheumatic fever. he does not have a history of alcohol use. The patient does not have a history of early familial atrial fibrillation or other arrhythmias.  he has a BMI of Body mass index is 46.57 kg/m.Marland Kitchen Filed Weights   02/12/19 1413  Weight: (!) 155.8 kg    No family history on file.   Atrial Fibrillation Management history:  Previous antiarrhythmic drugs: none Previous cardioversions: none Previous ablations: none CHADS2VASC score: 4 Anticoagulation history: Eliquis   Past Medical History:  Diagnosis Date  . CKD (chronic kidney disease)   . DM type 2 (diabetes mellitus, type 2) ([place])   . ED (erectile dysfunction)   . Fatty liver   . GERD (gastroesophageal reflux disease)   . Gout   . H/O adenomatous polyp of colon   . HLD (hyperlipidemia)   . HTN  (hypertension)   . Hypothyroid   . Panic attacks   . Psoriasis    Past Surgical History:  Procedure Laterality Date  . PACEMAKER IMPLANT N/A 11/24/2017   Procedure: PACEMAKER IMPLANT;  Surgeon: Deboraha Sprang, MD;  Location: [facility];  Service: Cardiovascular;  Laterality: N/A;    Current Outpatient Medications  Medication Sig Dispense Refill  . ALPRAZolam (XANAX) 1 MG tablet Take 0.5-1 mg by mouth daily as needed for anxiety.     Marland Kitchen apixaban (ELIQUIS) 5 MG TABS tablet Take 1 tablet (5 mg total) by mouth 2 (two) times daily. 60 tablet 5  . atorvastatin (LIPITOR) 40 MG tablet Take 20 mg by mouth daily.     . furosemide (LASIX) 40 MG tablet Take 1 tablet (40 mg total) by mouth every other day. 90 tablet 2  . levothyroxine (SYNTHROID, LEVOTHROID) 175 MCG tablet Take 175 mcg by mouth daily.     Marland Kitchen losartan (COZAAR) 50 MG tablet Take 1 tablet (50 mg total) by mouth daily.    . metFORMIN (GLUCOPHAGE) 1000 MG tablet Take 1,000 mg by mouth 2 (two) times daily with a meal.     . PARoxetine (PAXIL) 40 MG tablet Take 40 mg by mouth daily.    [name] Glycol-Propyl Glycol (SYSTANE ULTRA) 0.4-0.3 % SOLN Place 1 drop into both eyes daily as needed (dry eyes).    . silodosin (RAPAFLO) 8 MG CAPS capsule Take 8 mg by mouth as directed. 1 tablet every 3 days     Current Facility-Administered Medications  Medication Dose Route Frequency  Provider Last Rate Last Dose  . apixaban (ELIQUIS) tablet 5 mg  5 mg Oral Daily Deboraha Sprang, MD        Allergies  Allergen Reactions  . Hctz [Hydrochlorothiazide]     Gout flare   . Lexapro [Escitalopram Oxalate]     Ineffective for anxiety   . Lisinopril     angioedema     Social History   Socioeconomic History  . Marital status: Married    Spouse name: Not on file  . Number of children: Not on file  . Years of education: Not on file  . Highest education level: Not on file  Occupational History  . Not on file  Social Needs  .  Financial resource strain: Not on file  . Food insecurity    Worry: Not on file    Inability: Not on file  . Transportation needs    Medical: Not on file    Non-medical: Not on file  Tobacco Use  . Smoking status: Never Smoker  . Smokeless tobacco: Never Used  Substance and Sexual Activity  . Alcohol use: No    Alcohol/week: 0.0 standard drinks  . Drug use: No  . Sexual activity: Not on file  Lifestyle  . Physical activity    Days per week: Not on file    Minutes per session: Not on file  . Stress: Not on file  Relationships  . Social Herbalist on phone: Not on file    Gets together: Not on file    Attends religious service: Not on file    Active member of club or organization: Not on file    Attends meetings of clubs or organizations: Not on file    Relationship status: Not on file  . Intimate partner violence    Fear of current or ex partner: Not on file    Emotionally abused: Not on file    Physically abused: Not on file    Forced sexual activity: Not on file  Other Topics Concern  . Not on file  Social History Narrative  . Not on file     ROS- All systems are reviewed and negative except as per the HPI above.  Physical Exam: Vitals:   02/12/19 1413  BP: 126/76  Pulse: 93  Weight: (!) 155.8 kg  Height: 6' (1.829 m)    GEN- The patient is well appearing obese male, alert and oriented x 3 today.   Head- normocephalic, atraumatic Eyes-  Sclera clear, conjunctiva pink Ears- hearing intact Oropharynx- clear Neck- supple  Lungs- Clear to ausculation bilaterally, normal work of breathing Heart- Regular rate and rhythm, no murmurs, rubs or gallops  GI- soft, NT, ND, + BS Extremities- no clubbing, cyanosis, or edema MS- no significant deformity or atrophy Skin- psoriatic plaques  Psych- euthymic mood, full affect Neuro- strength and sensation are intact  Wt Readings from Last 3 Encounters:  02/12/19 (!) 155.8 kg  01/18/19 (!) 161.8 kg   06/11/18 (!) 160.8 kg    EKG today demonstrates AV dual paced rhythm HR 93, PR 172, QRS 176, QTc 561  Echo 07/17/17 demonstrated  - Left ventricle: Systolic function was normal. The estimated   ejection fraction was in the range of 50% to 55%. - Aortic valve: AV is thickened, calcified with restricted motion   Peak and mean gradients through the valve are 21 and 12 mm Hg   respectively consistent with mild AS. - Mitral valve: Calcified annulus. Mildly  thickened leaflets . - Left atrium: The atrium was moderately dilated. - Right ventricle: RVEF is difficult to assess even with use of   Definity Overall appears moderately depressed. The cavity size   was mildly dilated. - Right atrium: The atrium was moderately dilated.  Impressions:  - Poor acoustic windows limit study.   Epic records are reviewed at length today  Assessment and Plan:  1. New onset atrial fibrillation Previously SCAF. General education about afib discussed today and questions answered.  We also discussed his stroke risk and the risks and benefits of anticoagulation.  Patient was asymptomatic during his episodes and his heart rates appear controlled.  Continue Eliquis 5 mg BID. Will plan to repeat bmet/CBC on follow up.  This patients CHA2DS2-VASc Score and unadjusted Ischemic Stroke Rate (% per year) is equal to 4.8 % stroke rate/year from a score of 4  Above score calculated as 1 point each if present [CHF, HTN, DM, Vascular=MI/PAD/Aortic Plaque, Age if 65-74, or Male] Above score calculated as 2 points each if present [Age > 75, or Stroke/TIA/TE]   2. Obesity Body mass index is 46.57 kg/m. Lifestyle modification was discussed at length including regular exercise and weight reduction. Patient reports he has started walking up and down the drive per Dr Olin Pia recommendations. Encouraged him to continue.   3. Obstructive sleep apnea The importance of adequate treatment of sleep apnea was discussed  today in order to improve our ability to maintain sinus rhythm long term. Patient has not been compliant with CPAP. He received a new, smaller mask but has not tried it yet. Encouraged him to try to see if he could tolerate.   4. CHB S/p PPM, followed by Dr Caryl Comes and device clinic.   Follow up in the AF clinic in one month.   [facility] [address] [place], [place] [address] [phone] 02/12/2019 2:47 PM

## 2019-02-15 NOTE — Telephone Encounter (Signed)
I would aks him to try it again say every other day and see-- that's not something I have heard of but every patient is unique Thanks SK

## 2019-02-16 NOTE — Telephone Encounter (Signed)
Spoke with pt's wife regarding Dr. Olin Pia recommendation. She agreed to relay the message and encourage him to start back on his Mg supplement.

## 2019-02-23 ENCOUNTER — Ambulatory Visit (INDEPENDENT_AMBULATORY_CARE_PROVIDER_SITE_OTHER): Payer: Medicare HMO | Admitting: *Deleted

## 2019-02-23 DIAGNOSIS — I441 Atrioventricular block, second degree: Secondary | ICD-10-CM | POA: Diagnosis not present

## 2019-02-25 LAB — CUP PACEART REMOTE DEVICE CHECK
Battery Remaining Longevity: 110 mo
Battery Remaining Percentage: 95.5 %
Battery Voltage: 3.02 V
Brady Statistic AP VP Percent: 6.2 %
Brady Statistic AP VS Percent: 1 %
Brady Statistic AS VP Percent: 94 %
Brady Statistic AS VS Percent: 1 %
Brady Statistic RA Percent Paced: 5.1 %
Brady Statistic RV Percent Paced: 99 %
Date Time Interrogation Session: 20200924060019
Implantable Lead Implant Date: 20190624
Implantable Lead Implant Date: 20190624
Implantable Lead Location: 753859
Implantable Lead Location: 753860
Implantable Lead Model: 5076
Implantable Lead Model: 5076
Implantable Pulse Generator Implant Date: 20190624
Lead Channel Impedance Value: 530 Ohm
Lead Channel Impedance Value: 540 Ohm
Lead Channel Pacing Threshold Amplitude: 0.75 V
Lead Channel Pacing Threshold Amplitude: 0.75 V
Lead Channel Pacing Threshold Pulse Width: 0.5 ms
Lead Channel Pacing Threshold Pulse Width: 0.5 ms
Lead Channel Sensing Intrinsic Amplitude: 5 mV
Lead Channel Sensing Intrinsic Amplitude: 6.6 mV
Lead Channel Setting Pacing Amplitude: 2 V
Lead Channel Setting Pacing Amplitude: 2.5 V
Lead Channel Setting Pacing Pulse Width: 0.5 ms
Lead Channel Setting Sensing Sensitivity: 2 mV
Pulse Gen Model: 2272
Pulse Gen Serial Number: 9033095

## 2019-03-05 NOTE — Progress Notes (Signed)
Remote pacemaker transmission.   

## 2019-03-08 ENCOUNTER — Other Ambulatory Visit: Payer: Self-pay | Admitting: Internal Medicine

## 2019-03-08 MED ORDER — APIXABAN 5 MG PO TABS: 5.0000 mg | ORAL_TABLET | Freq: Two times a day (BID) | ORAL | 1 refills | Status: DC

## 2019-03-08 NOTE — Addendum Note (Signed)
Addended by: Johny Shock B on: 03/08/2019 11:18 AM   Modules accepted: Orders

## 2019-03-08 NOTE — Telephone Encounter (Signed)
New Message     *STAT* If patient is at the pharmacy, call can be transferred to refill team.   1. Which medications need to be refilled? (please list name of each medication and dose if known) Eliquis 5mg   2. Which pharmacy/location (including street and city if local pharmacy) is medication to be sent to? [facility]   3. Do they need a 30 day or 90 day supply? 30 day supply

## 2019-03-08 NOTE — Telephone Encounter (Signed)
Prescription refill request for Eliquis received.  Last office visit: Dr. Caryl Comes (01-18-2019) Scr: 1.82 (01-18-2019) Age: 78 yo Weight: 161.8 kg  Prescription refill sent.

## 2019-03-18 ENCOUNTER — Other Ambulatory Visit: Payer: Self-pay

## 2019-03-18 ENCOUNTER — Ambulatory Visit (HOSPITAL_COMMUNITY)
Admission: RE | Admit: 2019-03-18 | Discharge: 2019-03-18 | Disposition: A | Discharge status: Home / Self Care | Payer: Medicare HMO | Source: Ambulatory Visit | Attending: Physician Assistant | Admitting: Physician Assistant

## 2019-03-18 VITALS — BP 136/80 | HR 75 | Ht 72.0 in | Wt 348.0 lb

## 2019-03-18 DIAGNOSIS — M109 Gout, unspecified: Secondary | ICD-10-CM | POA: Diagnosis not present

## 2019-03-18 DIAGNOSIS — G4733 Obstructive sleep apnea (adult) (pediatric): Secondary | ICD-10-CM | POA: Insufficient documentation

## 2019-03-18 DIAGNOSIS — E1122 Type 2 diabetes mellitus with diabetic chronic kidney disease: Secondary | ICD-10-CM | POA: Insufficient documentation

## 2019-03-18 DIAGNOSIS — I480 Paroxysmal atrial fibrillation: Secondary | ICD-10-CM | POA: Diagnosis not present

## 2019-03-18 DIAGNOSIS — E669 Obesity, unspecified: Secondary | ICD-10-CM | POA: Insufficient documentation

## 2019-03-18 DIAGNOSIS — N189 Chronic kidney disease, unspecified: Secondary | ICD-10-CM | POA: Diagnosis not present

## 2019-03-18 DIAGNOSIS — I129 Hypertensive chronic kidney disease with stage 1 through stage 4 chronic kidney disease, or unspecified chronic kidney disease: Secondary | ICD-10-CM | POA: Diagnosis not present

## 2019-03-18 DIAGNOSIS — Z79899 Other long term (current) drug therapy: Secondary | ICD-10-CM | POA: Diagnosis not present

## 2019-03-18 DIAGNOSIS — E039 Hypothyroidism, unspecified: Secondary | ICD-10-CM | POA: Insufficient documentation

## 2019-03-18 DIAGNOSIS — E785 Hyperlipidemia, unspecified: Secondary | ICD-10-CM | POA: Insufficient documentation

## 2019-03-18 DIAGNOSIS — Z7901 Long term (current) use of anticoagulants: Secondary | ICD-10-CM | POA: Diagnosis not present

## 2019-03-18 DIAGNOSIS — Z950 Presence of cardiac pacemaker: Secondary | ICD-10-CM | POA: Insufficient documentation

## 2019-03-18 DIAGNOSIS — Z6841 Body Mass Index (BMI) 40.0 and over, adult: Secondary | ICD-10-CM | POA: Diagnosis not present

## 2019-03-18 DIAGNOSIS — K760 Fatty (change of) liver, not elsewhere classified: Secondary | ICD-10-CM | POA: Insufficient documentation

## 2019-03-18 LAB — BASIC METABOLIC PANEL
Anion gap: 11 (ref 5–15)
BUN: 21 mg/dL (ref 8–23)
CO2: 24 mmol/L (ref 22–32)
Calcium: 9.6 mg/dL (ref 8.9–10.3)
Chloride: 102 mmol/L (ref 98–111)
Creatinine, Ser: 1.72 mg/dL — ABNORMAL HIGH (ref 0.61–1.24)
GFR calc Af Amer: 43 mL/min — ABNORMAL LOW (ref >60)
GFR calc non Af Amer: 38 mL/min — ABNORMAL LOW (ref >60)
Glucose, Bld: 117 mg/dL — ABNORMAL HIGH (ref 70–99)
Potassium: 5.1 mmol/L (ref 3.5–5.1)
Sodium: 137 mmol/L (ref 135–145)

## 2019-03-18 LAB — CBC
HCT: 43.2 % (ref 39.0–52.0)
Hemoglobin: 13.1 g/dL (ref 13.0–17.0)
MCH: 27.3 pg (ref 26.0–34.0)
MCHC: 30.3 g/dL (ref 30.0–36.0)
MCV: 90 fL (ref 80.0–100.0)
Platelets: 213 10*3/uL (ref 150–400)
RBC: 4.8 MIL/uL (ref 4.22–5.81)
RDW: 14.3 % (ref 11.5–15.5)
WBC: 6 10*3/uL (ref 4.0–10.5)
nRBC: 0 % (ref 0.0–0.2)

## 2019-03-18 NOTE — Progress Notes (Signed)
Primary Care Physician: [name], MD Primary Cardiologist: Dr Claiborne Billings Primary Electrophysiologist: Dr Caryl Comes Referring Physician: Dr [facility]   [name] is a 78 y.o. male with a history of DM, HLD, HTN, hypothyroid, CKD, CHB s/p PPM, and new onset atrial fibrillation who presents for follow up in the [facility]. The patient has had an increase in atrial ectopy noted on device interrogation and now true episodes of AF. He was started on Eliquis on 02/05/19. Patient was unaware of his arrhythmia during the episodes. He has a diagnosis of OSA but is not on CPAP therapy. He denies any significant alcohol use.   On follow up today, patient reports that he has done very well since his last visit. He has been walking regularly most days of the week and feels like he "has more wind." He is tolerating the medication without bleeding issues.   Today, he denies symptoms of palpitations, chest pain, shortness of breath, orthopnea, PND, lower extremity edema, dizziness, presyncope, syncope, bleeding, or neurologic sequela. The patient is tolerating medications without difficulties and is otherwise without complaint today.    Atrial Fibrillation Risk Factors:  he does have symptoms or diagnosis of sleep apnea. he is not compliant with CPAP therapy. he does not have a history of rheumatic fever. he does not have a history of alcohol use. The patient does not have a history of early familial atrial fibrillation or other arrhythmias.  he has a BMI of Body mass index is 47.2 kg/m.Marland Kitchen Filed Weights   03/18/19 1422  Weight: (!) 157.9 kg    No family history on file.   Atrial Fibrillation Management history:  Previous antiarrhythmic drugs: none Previous cardioversions: none Previous ablations: none CHADS2VASC score: 4 Anticoagulation history: Eliquis   Past Medical History:  Diagnosis Date  . CKD (chronic kidney disease)   . DM type 2 (diabetes  mellitus, type 2) ([place])   . ED (erectile dysfunction)   . Fatty liver   . GERD (gastroesophageal reflux disease)   . Gout   . H/O adenomatous polyp of colon   . HLD (hyperlipidemia)   . HTN (hypertension)   . Hypothyroid   . Panic attacks   . Psoriasis    Past Surgical History:  Procedure Laterality Date  . 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 ALPRAZolam (XANAX) 1 MG tablet Take 0.5-1 mg by mouth daily as needed for anxiety.     Marland Kitchen apixaban (ELIQUIS) 5 MG TABS tablet Take 1 tablet (5 mg total) by mouth 2 (two) times daily. 180 tablet 1  . atorvastatin (LIPITOR) 40 MG tablet Take 20 mg by mouth daily. Taking 1/2 tablet daily    . furosemide (LASIX) 40 MG tablet Take 1 tablet (40 mg total) by mouth every other day. 90 tablet 2  . levothyroxine (SYNTHROID, LEVOTHROID) 175 MCG tablet Take 175 mcg by mouth daily.     Marland Kitchen losartan (COZAAR) 50 MG tablet Take 1 tablet (50 mg total) by mouth daily.    . metFORMIN (GLUCOPHAGE) 1000 MG tablet Take 1,000 mg by mouth 2 (two) times daily with a meal.     . PARoxetine (PAXIL) 40 MG tablet Take 40 mg by mouth daily.    [name] Glycol-Propyl Glycol (SYSTANE ULTRA) 0.4-0.3 % SOLN Place 1  drop into both eyes daily as needed (dry eyes).    . silodosin (RAPAFLO) 8 MG CAPS capsule Take 8 mg by mouth as directed. Taking as needed     Current Facility-Administered Medications  Medication Dose Route Frequency Provider Last Rate Last Dose  . apixaban (ELIQUIS) tablet 5 mg  5 mg Oral Daily Deboraha Sprang, MD        Allergies  Allergen Reactions  . Hctz [Hydrochlorothiazide]     Gout flare   . Lexapro [Escitalopram Oxalate]     Ineffective for anxiety   . Lisinopril     angioedema     Social History   Socioeconomic History  . Marital status:  Married    Spouse name: Not on file  . Number of children: Not on file  . Years of education: Not on file  . Highest education level: Not on file  Occupational History  . Not on file  Social Needs  . Financial resource strain: Not on file  . Food insecurity    Worry: Not on file    Inability: Not on file  . Transportation needs    Medical: Not on file    Non-medical: Not on file  Tobacco Use  . Smoking status: Never Smoker  . Smokeless tobacco: Never Used  Substance and Sexual Activity  . Alcohol use: No    Alcohol/week: 0.0 standard drinks  . Drug use: No  . Sexual activity: Not on file  Lifestyle  . Physical activity    Days per week: Not on file    Minutes per session: Not on file  . Stress: Not on file  Relationships  . Social Herbalist on phone: Not on file    Gets together: Not on file    Attends religious service: Not on file    Active member of club or organization: Not on file    Attends meetings of clubs or organizations: Not on file    Relationship status: Not on file  . Intimate partner violence    Fear of current or ex partner: Not on file    Emotionally abused: Not on file    Physically abused: Not on file    Forced sexual activity: Not on file  Other Topics Concern  . Not on file  Social History Narrative  . Not on file     ROS- All systems are reviewed and negative except as per the HPI above.  Physical Exam: Vitals:   03/18/19 1422  BP: 136/80  Pulse: 75  Weight: (!) 157.9 kg  Height: 6' (1.829 m)    GEN- The patient is well appearing obese male, alert and oriented x 3 today.   HEENT-head normocephalic, atraumatic, sclera clear, conjunctiva pink, hearing intact, trachea midline. Lungs- Clear to ausculation bilaterally, normal work of breathing Heart- Regular rate and rhythm, no murmurs, rubs or gallops  GI- soft, NT, ND, + BS Extremities- no clubbing, cyanosis, or edema MS- no significant deformity or atrophy Skin-  psoriatic plaques  Psych- euthymic mood, full affect Neuro- strength and sensation are intact   Wt Readings from Last 3 Encounters:  03/18/19 (!) 157.9 kg  02/12/19 (!) 155.8 kg  01/18/19 (!) 161.8 kg    EKG today demonstrates A sensed V paced rhythm HR 75, PR 188, QRS 162, QTc 533  Echo 07/17/17 demonstrated  - Left ventricle: Systolic function was normal. The estimated   ejection fraction was in the range of 50% to 55%. -  Aortic valve: AV is thickened, calcified with restricted motion   Peak and mean gradients through the valve are 21 and 12 mm Hg   respectively consistent with mild AS. - Mitral valve: Calcified annulus. Mildly thickened leaflets . - Left atrium: The atrium was moderately dilated. - Right ventricle: RVEF is difficult to assess even with use of   Definity Overall appears moderately depressed. The cavity size   was mildly dilated. - Right atrium: The atrium was moderately dilated.  Impressions:  - Poor acoustic windows limit study.   Epic records are reviewed at length today  Assessment and Plan:  1. Paroxysmal atrial fibrillation Patient's AF burden 17% since 8/17, but less recently. Patient feels well overall.  Continue Eliquis 5 mg BID.  Check Bmet/CBC today.  Lifestyle changes as below.  This patients CHA2DS2-VASc Score and unadjusted Ischemic Stroke Rate (% per year) is equal to 4.8 % stroke rate/year from a score of 4  Above score calculated as 1 point each if present [CHF, HTN, DM, Vascular=MI/PAD/Aortic Plaque, Age if 65-74, or Male] Above score calculated as 2 points each if present [Age > 75, or Stroke/TIA/TE]   2. Obesity Body mass index is 47.2 kg/m. Lifestyle modification was discussed and encouraged including regular physical activity and weight reduction. Patient doing well walking most days of the week.  3. Obstructive sleep apnea The importance of adequate treatment of sleep apnea was discussed today in order to improve our  ability to maintain sinus rhythm long term. Patient reluctant to start CPAP again. Encouraged him to try.  4. CHB S/p PPM, followed by Dr Caryl Comes and the [facility].    Follow up in the AF clinic in 3 months.    [facility] [address] [place], [place] [address] [phone] 03/18/2019 3:09 PM

## 2019-05-03 ENCOUNTER — Telehealth: Payer: Self-pay | Admitting: Emergency Medicine

## 2019-05-03 NOTE — Telephone Encounter (Addendum)
Patient sent remote transmission confirming he is still in AF with controlled v-rates. . Current episode has been ongoing since May 24, 2019. + Eliquis.  Reports he had sone intermittent dizziness with position change, decreased apetite and deceased energy level since 05/24/19.Reports he has not experienced SOB, palpitations or increased edema in LE. BP taken with home monitor was 137/78.  Will contact device clinic if he has change in condition and ED precautions given. Will notify AF Clinic of ongoing AF.

## 2019-05-04 NOTE — Telephone Encounter (Signed)
Called patient and offered sooner appt.  Pt stated he is feeling "good" and declined sooner appt.  Pt advised to call us if he starts feeling bad and/or needs to come sooner, pt voiced understanding.

## 2019-05-05 NOTE — Telephone Encounter (Signed)
Noted  

## 2019-05-25 ENCOUNTER — Ambulatory Visit (INDEPENDENT_AMBULATORY_CARE_PROVIDER_SITE_OTHER): Payer: Medicare HMO | Admitting: *Deleted

## 2019-05-25 DIAGNOSIS — I441 Atrioventricular block, second degree: Secondary | ICD-10-CM | POA: Diagnosis not present

## 2019-05-25 LAB — CUP PACEART REMOTE DEVICE CHECK
Battery Remaining Longevity: 108 mo
Battery Remaining Percentage: 95.5 %
Battery Voltage: 3.02 V
Brady Statistic AP VP Percent: 5.8 %
Brady Statistic AP VS Percent: 1 %
Brady Statistic AS VP Percent: 94 %
Brady Statistic AS VS Percent: 1 %
Brady Statistic RA Percent Paced: 5.1 %
Brady Statistic RV Percent Paced: 99 %
Date Time Interrogation Session: 20201222040015
Implantable Lead Implant Date: 20190624
Implantable Lead Implant Date: 20190624
Implantable Lead Location: 753859
Implantable Lead Location: 753860
Implantable Lead Model: 5076
Implantable Lead Model: 5076
Implantable Pulse Generator Implant Date: 20190624
Lead Channel Impedance Value: 490 Ohm
Lead Channel Impedance Value: 530 Ohm
Lead Channel Pacing Threshold Amplitude: 0.75 V
Lead Channel Pacing Threshold Amplitude: 0.75 V
Lead Channel Pacing Threshold Pulse Width: 0.5 ms
Lead Channel Pacing Threshold Pulse Width: 0.5 ms
Lead Channel Sensing Intrinsic Amplitude: 5 mV
Lead Channel Sensing Intrinsic Amplitude: 5.8 mV
Lead Channel Setting Pacing Amplitude: 2 V
Lead Channel Setting Pacing Amplitude: 2.5 V
Lead Channel Setting Pacing Pulse Width: 0.5 ms
Lead Channel Setting Sensing Sensitivity: 2 mV
Pulse Gen Model: 2272
Pulse Gen Serial Number: 9033095

## 2019-06-09 DIAGNOSIS — H5210 Myopia, unspecified eye: Secondary | ICD-10-CM | POA: Diagnosis not present

## 2019-06-11 DIAGNOSIS — F39 Unspecified mood [affective] disorder: Secondary | ICD-10-CM | POA: Diagnosis not present

## 2019-06-11 DIAGNOSIS — E1122 Type 2 diabetes mellitus with diabetic chronic kidney disease: Secondary | ICD-10-CM | POA: Diagnosis not present

## 2019-06-11 DIAGNOSIS — F411 Generalized anxiety disorder: Secondary | ICD-10-CM | POA: Diagnosis not present

## 2019-06-11 DIAGNOSIS — N183 Chronic kidney disease, stage 3 unspecified: Secondary | ICD-10-CM | POA: Diagnosis not present

## 2019-06-11 DIAGNOSIS — I4891 Unspecified atrial fibrillation: Secondary | ICD-10-CM | POA: Diagnosis not present

## 2019-06-11 DIAGNOSIS — N400 Enlarged prostate without lower urinary tract symptoms: Secondary | ICD-10-CM | POA: Diagnosis not present

## 2019-06-11 DIAGNOSIS — I129 Hypertensive chronic kidney disease with stage 1 through stage 4 chronic kidney disease, or unspecified chronic kidney disease: Secondary | ICD-10-CM | POA: Diagnosis not present

## 2019-06-11 DIAGNOSIS — E7800 Pure hypercholesterolemia, unspecified: Secondary | ICD-10-CM | POA: Diagnosis not present

## 2019-06-11 DIAGNOSIS — E039 Hypothyroidism, unspecified: Secondary | ICD-10-CM | POA: Diagnosis not present

## 2019-06-11 DIAGNOSIS — Z6841 Body Mass Index (BMI) 40.0 and over, adult: Secondary | ICD-10-CM | POA: Diagnosis not present

## 2019-06-11 DIAGNOSIS — E1129 Type 2 diabetes mellitus with other diabetic kidney complication: Secondary | ICD-10-CM | POA: Diagnosis not present

## 2019-06-14 DIAGNOSIS — E039 Hypothyroidism, unspecified: Secondary | ICD-10-CM | POA: Diagnosis not present

## 2019-06-14 DIAGNOSIS — I129 Hypertensive chronic kidney disease with stage 1 through stage 4 chronic kidney disease, or unspecified chronic kidney disease: Secondary | ICD-10-CM | POA: Diagnosis not present

## 2019-06-14 DIAGNOSIS — E1129 Type 2 diabetes mellitus with other diabetic kidney complication: Secondary | ICD-10-CM | POA: Diagnosis not present

## 2019-06-14 DIAGNOSIS — E7800 Pure hypercholesterolemia, unspecified: Secondary | ICD-10-CM | POA: Diagnosis not present

## 2019-06-24 ENCOUNTER — Other Ambulatory Visit: Payer: Self-pay

## 2019-06-24 ENCOUNTER — Ambulatory Visit (HOSPITAL_COMMUNITY)
Admission: RE | Admit: 2019-06-24 | Discharge: 2019-06-24 | Disposition: A | Discharge status: Home / Self Care | Payer: Medicare HMO | Source: Ambulatory Visit | Attending: Physician Assistant | Admitting: Physician Assistant

## 2019-06-24 VITALS — BP 140/76 | HR 77 | Ht 72.0 in | Wt 348.0 lb

## 2019-06-24 DIAGNOSIS — Z950 Presence of cardiac pacemaker: Secondary | ICD-10-CM | POA: Diagnosis not present

## 2019-06-24 DIAGNOSIS — Z882 Allergy status to sulfonamides status: Secondary | ICD-10-CM | POA: Diagnosis not present

## 2019-06-24 DIAGNOSIS — Z79899 Other long term (current) drug therapy: Secondary | ICD-10-CM | POA: Insufficient documentation

## 2019-06-24 DIAGNOSIS — E039 Hypothyroidism, unspecified: Secondary | ICD-10-CM | POA: Diagnosis not present

## 2019-06-24 DIAGNOSIS — E669 Obesity, unspecified: Secondary | ICD-10-CM | POA: Diagnosis not present

## 2019-06-24 DIAGNOSIS — G4733 Obstructive sleep apnea (adult) (pediatric): Secondary | ICD-10-CM | POA: Insufficient documentation

## 2019-06-24 DIAGNOSIS — F410 Panic disorder [episodic paroxysmal anxiety] without agoraphobia: Secondary | ICD-10-CM | POA: Insufficient documentation

## 2019-06-24 DIAGNOSIS — Z79890 Hormone replacement therapy (postmenopausal): Secondary | ICD-10-CM | POA: Insufficient documentation

## 2019-06-24 DIAGNOSIS — D6869 Other thrombophilia: Secondary | ICD-10-CM | POA: Diagnosis not present

## 2019-06-24 DIAGNOSIS — Z888 Allergy status to other drugs, medicaments and biological substances status: Secondary | ICD-10-CM | POA: Insufficient documentation

## 2019-06-24 DIAGNOSIS — E785 Hyperlipidemia, unspecified: Secondary | ICD-10-CM | POA: Insufficient documentation

## 2019-06-24 DIAGNOSIS — I129 Hypertensive chronic kidney disease with stage 1 through stage 4 chronic kidney disease, or unspecified chronic kidney disease: Secondary | ICD-10-CM | POA: Diagnosis not present

## 2019-06-24 DIAGNOSIS — R9431 Abnormal electrocardiogram [ECG] [EKG]: Secondary | ICD-10-CM | POA: Diagnosis not present

## 2019-06-24 DIAGNOSIS — E1122 Type 2 diabetes mellitus with diabetic chronic kidney disease: Secondary | ICD-10-CM | POA: Insufficient documentation

## 2019-06-24 DIAGNOSIS — Z7901 Long term (current) use of anticoagulants: Secondary | ICD-10-CM | POA: Diagnosis not present

## 2019-06-24 DIAGNOSIS — Z6841 Body Mass Index (BMI) 40.0 and over, adult: Secondary | ICD-10-CM | POA: Insufficient documentation

## 2019-06-24 DIAGNOSIS — N189 Chronic kidney disease, unspecified: Secondary | ICD-10-CM | POA: Diagnosis not present

## 2019-06-24 DIAGNOSIS — I442 Atrioventricular block, complete: Secondary | ICD-10-CM | POA: Diagnosis not present

## 2019-06-24 DIAGNOSIS — M109 Gout, unspecified: Secondary | ICD-10-CM | POA: Diagnosis not present

## 2019-06-24 DIAGNOSIS — I480 Paroxysmal atrial fibrillation: Secondary | ICD-10-CM | POA: Diagnosis not present

## 2019-06-24 DIAGNOSIS — Z7984 Long term (current) use of oral hypoglycemic drugs: Secondary | ICD-10-CM | POA: Insufficient documentation

## 2019-06-24 MED ORDER — FUROSEMIDE 20 MG PO TABS: 20.0000 mg | ORAL_TABLET | Freq: Every day | ORAL | Status: DC

## 2019-06-24 NOTE — Progress Notes (Signed)
Primary Care Physician: [name], MD Primary Cardiologist: Dr Claiborne Billings Primary Electrophysiologist: Dr Caryl Comes Referring Physician: Dr [facility]   [name] is a 79 y.o. male with a history of DM, HLD, HTN, hypothyroid, CKD, CHB s/p PPM, and new onset atrial fibrillation who presents for follow up in the [facility]. The patient has had an increase in atrial ectopy noted on device interrogation and now true episodes of AF. He was started on Eliquis on 02/05/19 for a CHADS2VASC score of 4. Patient was unaware of his arrhythmia during the episodes. He has a diagnosis of OSA but is not on CPAP therapy. He denies any significant alcohol use.   On follow up today, patient reports he has done well from a cardiac standpoint. His AF burden is down to 10% from 17%. He did have an episode of afib the first week of 05/2019 which lasted for 6 days but he spontaneously converted. He was asymptomatic.   Today, he denies symptoms of palpitations, chest pain, shortness of breath, orthopnea, PND, lower extremity edema, dizziness, presyncope, syncope, bleeding, or neurologic sequela. The patient is tolerating medications without difficulties and is otherwise without complaint today.    Atrial Fibrillation Risk Factors:  he does have symptoms or diagnosis of sleep apnea. he is not compliant with CPAP therapy. he does not have a history of rheumatic fever. he does not have a history of alcohol use. The patient does not have a history of early familial atrial fibrillation or other arrhythmias.  he has a BMI of Body mass index is 47.2 kg/m.Marland Kitchen Filed Weights   06/24/19 1439  Weight: (!) 157.9 kg    No family history on file.   Atrial Fibrillation Management history:  Previous antiarrhythmic drugs: none Previous cardioversions: none Previous ablations: none CHADS2VASC score: 4 Anticoagulation history: Eliquis   Past Medical History:  Diagnosis Date  . CKD  (chronic kidney disease)   . DM type 2 (diabetes mellitus, type 2) ([place])   . ED (erectile dysfunction)   . Fatty liver   . GERD (gastroesophageal reflux disease)   . Gout   . H/O adenomatous polyp of colon   . HLD (hyperlipidemia)   . HTN (hypertension)   . Hypothyroid   . Panic attacks   . Psoriasis    Past Surgical History:  Procedure Laterality Date  . 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 ALPRAZolam (XANAX) 1 MG tablet Take 0.5-1 mg by mouth daily as needed for anxiety.     Marland Kitchen apixaban (ELIQUIS) 5 MG TABS tablet Take 1 tablet (5 mg total) by mouth 2 (two) times daily. 180 tablet 1  . atorvastatin (LIPITOR) 40 MG tablet Take 20 mg by mouth daily. Taking 1/2 tablet daily    . Cholecalciferol 25 MCG (1000 UT) tablet Take 1,000 Units by mouth daily.    . furosemide (LASIX) 40 MG tablet Take 1 tablet (40 mg total) by mouth every other day. (Patient taking differently: Take 20 mg by mouth daily. ) 90 tablet 2  . levothyroxine (SYNTHROID, LEVOTHROID) 175 MCG tablet Take 175 mcg by mouth daily.     Marland Kitchen losartan (COZAAR) 50 MG tablet Take 1 tablet (50 mg total) by mouth daily.    . metFORMIN (GLUCOPHAGE) 1000 MG tablet Take 1,000 mg by  mouth daily.     Marland Kitchen PARoxetine (PAXIL) 40 MG tablet Take 40 mg by mouth daily.    [name] Glycol-Propyl Glycol (SYSTANE ULTRA) 0.4-0.3 % SOLN Place 1 drop into both eyes daily as needed (dry eyes).    . RESTASIS 0.05 % ophthalmic emulsion      Current Facility-Administered Medications  Medication Dose Route Frequency Provider Last Rate Last Admin  . apixaban (ELIQUIS) tablet 5 mg  5 mg Oral Daily Deboraha Sprang, MD        Allergies  Allergen Reactions  . Hctz [Hydrochlorothiazide]     Gout flare   . Lexapro [Escitalopram Oxalate]     Ineffective  for anxiety   . Lisinopril     angioedema     Social History   Socioeconomic History  . Marital status: Married    Spouse name: Not on file  . Number of children: Not on file  . Years of education: Not on file  . Highest education level: Not on file  Occupational History  . Not on file  Tobacco Use  . Smoking status: Never Smoker  . Smokeless tobacco: Never Used  Substance and Sexual Activity  . Alcohol use: No    Alcohol/week: 0.0 standard drinks  . Drug use: No  . Sexual activity: Not on file  Other Topics Concern  . Not on file  Social History Narrative  . Not on file   Social Determinants of Health   Financial Resource Strain:   . Difficulty of Paying Living Expenses: Not on file  Food Insecurity:   . Worried About Charity fundraiser in the Last Year: Not on file  . Ran Out of Food in the Last Year: Not on file  Transportation Needs:   . Lack of Transportation (Medical): Not on file  . Lack of Transportation (Non-Medical): Not on file  Physical Activity:   . Days of Exercise per Week: Not on file  . Minutes of Exercise per Session: Not on file  Stress:   . Feeling of Stress : Not on file  Social Connections:   . Frequency of Communication with Friends and Family: Not on file  . Frequency of Social Gatherings with Friends and Family: Not on file  . Attends Religious Services: Not on file  . Active Member of Clubs or Organizations: Not on file  . Attends Archivist Meetings: Not on file  . Marital Status: Not on file  Intimate Partner Violence:   . Fear of Current or Ex-Partner: Not on file  . Emotionally Abused: Not on file  . Physically Abused: Not on file  . Sexually Abused: Not on file     ROS- All systems are reviewed and negative except as per the HPI above.  Physical Exam: Vitals:   06/24/19 1439  BP: 140/76  Pulse: 77  SpO2: 96%  Weight: (!) 157.9 kg  Height: 6' (1.829 m)    GEN- The patient is well appearing obese male,  alert and oriented x 3 today.   HEENT-head normocephalic, atraumatic, sclera clear, conjunctiva pink, hearing intact, trachea midline. Lungs- Clear to ausculation bilaterally, normal work of breathing Heart- Regular rate and rhythm, no rubs or gallops. 2/6 systolic murmur GI- soft, NT, ND, + BS Extremities- no clubbing, cyanosis, or edema MS- no significant deformity or atrophy Skin- psoriatic plaques  Psych- euthymic mood, full affect Neuro- strength and sensation are intact   Wt Readings from Last 3 Encounters:  06/24/19 (!) 157.9 kg  03/18/19 (!) 157.9 kg  02/12/19 (!) 155.8 kg    EKG today demonstrates A sense V paced rhythm HR 77, PR 196, QRS 162, QTc 536  Echo 07/17/17 demonstrated  - Left ventricle: Systolic function was normal. The estimated   ejection fraction was in the range of 50% to 55%. - Aortic valve: AV is thickened, calcified with restricted motion   Peak and mean gradients through the valve are 21 and 12 mm Hg   respectively consistent with mild AS. - Mitral valve: Calcified annulus. Mildly thickened leaflets . - Left atrium: The atrium was moderately dilated. - Right ventricle: RVEF is difficult to assess even with use of   Definity Overall appears moderately depressed. The cavity size   was mildly dilated. - Right atrium: The atrium was moderately dilated.  Impressions:  - Poor acoustic windows limit study.   Epic records are reviewed at length today  Assessment and Plan:  1. Paroxysmal atrial fibrillation Patient's AF burden down from 17% to 10%.  Asymptomatic when he is in afib. Continue Eliquis 5 mg BID.  Lifestyle changes as below.  This patients CHA2DS2-VASc Score and unadjusted Ischemic Stroke Rate (% per year) is equal to 4.8 % stroke rate/year from a score of 4  Above score calculated as 1 point each if present [CHF, HTN, DM, Vascular=MI/PAD/Aortic Plaque, Age if 65-74, or Male] Above score calculated as 2 points each if present [Age >  75, or Stroke/TIA/TE]   2. Obesity Body mass index is 47.2 kg/m. Lifestyle modification was discussed and encouraged including regular physical activity and weight reduction.  3. Obstructive sleep apnea The importance of adequate treatment of sleep apnea was discussed today in order to improve our ability to maintain sinus rhythm long term. Encouraged CPAP compliance.   4. CHB S/p PPM, followed by Dr Caryl Comes and the device clinic.    Follow up in the AF clinic in 3 months.    [facility] [address] [place], [place] [address] [phone] 06/24/2019 3:21 PM

## 2019-06-24 NOTE — Addendum Note (Signed)
Encounter addended by: Enid Derry, CMA on: 06/24/2019 3:30 PM  Actions taken: Order list changed

## 2019-08-24 ENCOUNTER — Ambulatory Visit (INDEPENDENT_AMBULATORY_CARE_PROVIDER_SITE_OTHER): Payer: Medicare HMO | Admitting: *Deleted

## 2019-08-24 DIAGNOSIS — I441 Atrioventricular block, second degree: Secondary | ICD-10-CM | POA: Diagnosis not present

## 2019-08-24 LAB — CUP PACEART REMOTE DEVICE CHECK
Battery Remaining Longevity: 106 mo
Battery Remaining Percentage: 95.5 %
Battery Voltage: 3.01 V
Brady Statistic AP VP Percent: 4.6 %
Brady Statistic AP VS Percent: 1 %
Brady Statistic AS VP Percent: 95 %
Brady Statistic AS VS Percent: 1 %
Brady Statistic RA Percent Paced: 4.1 %
Brady Statistic RV Percent Paced: 99 %
Date Time Interrogation Session: 20210323020013
Implantable Lead Implant Date: 20190624
Implantable Lead Implant Date: 20190624
Implantable Lead Location: 753859
Implantable Lead Location: 753860
Implantable Lead Model: 5076
Implantable Lead Model: 5076
Implantable Pulse Generator Implant Date: 20190624
Lead Channel Impedance Value: 450 Ohm
Lead Channel Impedance Value: 480 Ohm
Lead Channel Pacing Threshold Amplitude: 0.75 V
Lead Channel Pacing Threshold Amplitude: 0.75 V
Lead Channel Pacing Threshold Pulse Width: 0.5 ms
Lead Channel Pacing Threshold Pulse Width: 0.5 ms
Lead Channel Sensing Intrinsic Amplitude: 4.8 mV
Lead Channel Sensing Intrinsic Amplitude: 5.2 mV
Lead Channel Setting Pacing Amplitude: 2 V
Lead Channel Setting Pacing Amplitude: 2.5 V
Lead Channel Setting Pacing Pulse Width: 0.5 ms
Lead Channel Setting Sensing Sensitivity: 2 mV
Pulse Gen Model: 2272
Pulse Gen Serial Number: 9033095

## 2019-08-25 NOTE — Progress Notes (Signed)
PPM Remote  

## 2019-09-10 ENCOUNTER — Telehealth: Payer: Self-pay

## 2019-09-10 NOTE — Telephone Encounter (Signed)
Noted  

## 2019-09-10 NOTE — Telephone Encounter (Signed)
PM alert received, pt had HVR episode on 09/08/19 1712 lasting about 9 seconds.    Spoke with pt, at time of event he was working on his generator and may have been over exerting himself.  States he does not recall any symptoms at time fo episode.    Confirmed for pt that he does have any appt with AF clioic on 4/22

## 2019-09-23 ENCOUNTER — Ambulatory Visit (HOSPITAL_COMMUNITY)
Admission: RE | Admit: 2019-09-23 | Discharge: 2019-09-23 | Disposition: A | Discharge status: Home / Self Care | Payer: Medicare HMO | Source: Ambulatory Visit | Attending: Physician Assistant | Admitting: Physician Assistant

## 2019-09-23 ENCOUNTER — Encounter (HOSPITAL_COMMUNITY): Payer: Self-pay | Admitting: Physician Assistant

## 2019-09-23 ENCOUNTER — Other Ambulatory Visit: Payer: Self-pay

## 2019-09-23 VITALS — BP 116/60 | HR 79 | Ht 72.0 in | Wt 350.0 lb

## 2019-09-23 DIAGNOSIS — Z6841 Body Mass Index (BMI) 40.0 and over, adult: Secondary | ICD-10-CM | POA: Diagnosis not present

## 2019-09-23 DIAGNOSIS — I129 Hypertensive chronic kidney disease with stage 1 through stage 4 chronic kidney disease, or unspecified chronic kidney disease: Secondary | ICD-10-CM | POA: Insufficient documentation

## 2019-09-23 DIAGNOSIS — D6869 Other thrombophilia: Secondary | ICD-10-CM | POA: Diagnosis not present

## 2019-09-23 DIAGNOSIS — I480 Paroxysmal atrial fibrillation: Secondary | ICD-10-CM

## 2019-09-23 DIAGNOSIS — Z7902 Long term (current) use of antithrombotics/antiplatelets: Secondary | ICD-10-CM | POA: Diagnosis not present

## 2019-09-23 DIAGNOSIS — Z7984 Long term (current) use of oral hypoglycemic drugs: Secondary | ICD-10-CM | POA: Insufficient documentation

## 2019-09-23 DIAGNOSIS — G4733 Obstructive sleep apnea (adult) (pediatric): Secondary | ICD-10-CM | POA: Insufficient documentation

## 2019-09-23 DIAGNOSIS — N189 Chronic kidney disease, unspecified: Secondary | ICD-10-CM | POA: Diagnosis not present

## 2019-09-23 DIAGNOSIS — E669 Obesity, unspecified: Secondary | ICD-10-CM | POA: Diagnosis not present

## 2019-09-23 DIAGNOSIS — E1165 Type 2 diabetes mellitus with hyperglycemia: Secondary | ICD-10-CM | POA: Insufficient documentation

## 2019-09-23 DIAGNOSIS — Z79899 Other long term (current) drug therapy: Secondary | ICD-10-CM | POA: Insufficient documentation

## 2019-09-23 DIAGNOSIS — I4891 Unspecified atrial fibrillation: Secondary | ICD-10-CM | POA: Diagnosis present

## 2019-09-23 DIAGNOSIS — E039 Hypothyroidism, unspecified: Secondary | ICD-10-CM | POA: Insufficient documentation

## 2019-09-23 DIAGNOSIS — Z79890 Hormone replacement therapy (postmenopausal): Secondary | ICD-10-CM | POA: Diagnosis not present

## 2019-09-23 DIAGNOSIS — E1122 Type 2 diabetes mellitus with diabetic chronic kidney disease: Secondary | ICD-10-CM | POA: Insufficient documentation

## 2019-09-23 DIAGNOSIS — Z7901 Long term (current) use of anticoagulants: Secondary | ICD-10-CM | POA: Insufficient documentation

## 2019-09-23 DIAGNOSIS — Z950 Presence of cardiac pacemaker: Secondary | ICD-10-CM | POA: Insufficient documentation

## 2019-09-23 DIAGNOSIS — E785 Hyperlipidemia, unspecified: Secondary | ICD-10-CM | POA: Diagnosis not present

## 2019-09-23 NOTE — Progress Notes (Signed)
Primary Care Physician: [name], MD Primary Cardiologist: Dr Claiborne Billings Primary Electrophysiologist: Dr Caryl Comes Referring Physician: Dr [facility]   [name] is a 79 y.o. male with a history of DM, HLD, HTN, hypothyroid, CKD, CHB s/p PPM, and new onset atrial fibrillation who presents for follow up in the [facility]. The patient has had an increase in atrial ectopy noted on device interrogation and now true episodes of AF. He was started on Eliquis on 02/05/19 for a CHADS2VASC score of 4. Patient was unaware of his arrhythmia during the episodes. He has a diagnosis of OSA but is not on CPAP therapy. He denies any significant alcohol use. He did have an episode of afib the first week of 05/2019 which lasted for 6 days but he spontaneously converted. He was asymptomatic.   On follow up today, patient reports that he has done well since his last visit. Device interrogation shows no new afib episodes with a 5.8% overall burden. He denies bleeding issues on anticoagulation.   Today, he denies symptoms of palpitations, chest pain, shortness of breath, orthopnea, PND, lower extremity edema, dizziness, presyncope, syncope, bleeding, or neurologic sequela. The patient is tolerating medications without difficulties and is otherwise without complaint today.    Atrial Fibrillation Risk Factors:  he does have symptoms or diagnosis of sleep apnea. he is not compliant with CPAP therapy. he does not have a history of rheumatic fever. he does not have a history of alcohol use. The patient does not have a history of early familial atrial fibrillation or other arrhythmias.  he has a BMI of Body mass index is 47.47 kg/m.Marland Kitchen Filed Weights   09/23/19 1440  Weight: (!) 158.8 kg    No family history on file.   Atrial Fibrillation Management history:  Previous antiarrhythmic drugs: none Previous cardioversions: none Previous ablations: none CHADS2VASC score:  4 Anticoagulation history: Eliquis   Past Medical History:  Diagnosis Date  . CKD (chronic kidney disease)   . DM type 2 (diabetes mellitus, type 2) ([place])   . ED (erectile dysfunction)   . Fatty liver   . GERD (gastroesophageal reflux disease)   . Gout   . H/O adenomatous polyp of colon   . HLD (hyperlipidemia)   . HTN (hypertension)   . Hypothyroid   . Panic attacks   . Psoriasis    Past Surgical History:  Procedure Laterality Date  . PACEMAKER IMPLANT N/A 11/24/2017   Procedure: PACEMAKER IMPLANT;  Surgeon: Deboraha Sprang, MD;  Location: [facility];  Service: Cardiovascular;  Laterality: N/A;    Current Outpatient Medications  Medication Sig Dispense Refill  . ALPRAZolam (XANAX) 1 MG tablet Take 0.5-1 mg by mouth daily as needed for anxiety.     Marland Kitchen apixaban (ELIQUIS) 5 MG TABS tablet Take 1 tablet (5 mg total) by mouth 2 (two) times daily. 180 tablet 1  . atorvastatin (LIPITOR) 40 MG tablet Take 20 mg by mouth daily. Taking 1/2 tablet daily    . Cholecalciferol 25 MCG (1000 UT) tablet Take 1,000 Units by mouth daily.    . furosemide (LASIX) 20 MG tablet Take 1 tablet (20 mg total) by mouth daily.    Marland Kitchen levothyroxine (SYNTHROID, LEVOTHROID) 175 MCG tablet Take 175 mcg by mouth daily.     Marland Kitchen losartan (COZAAR) 50 MG tablet Take 1 tablet (50 mg total) by mouth daily.    . metFORMIN (GLUCOPHAGE) 1000 MG tablet Take 1,000 mg by mouth daily.     Marland Kitchen  PARoxetine (PAXIL) 40 MG tablet Take 40 mg by mouth daily.    [name] Glycol-Propyl Glycol (SYSTANE ULTRA) 0.4-0.3 % SOLN Place 1 drop into both eyes daily as needed (dry eyes).    . RESTASIS 0.05 % ophthalmic emulsion      No current facility-administered medications for this encounter.    Allergies  Allergen Reactions  . Hctz [Hydrochlorothiazide]     Gout flare   . Lexapro [Escitalopram Oxalate]     Ineffective for anxiety   . Lisinopril     angioedema     Social History   Socioeconomic History  . Marital  status: Married    Spouse name: Not on file  . Number of children: Not on file  . Years of education: Not on file  . Highest education level: Not on file  Occupational History  . Not on file  Tobacco Use  . Smoking status: Never Smoker  . Smokeless tobacco: Never Used  Substance and Sexual Activity  . Alcohol use: No    Alcohol/week: 0.0 standard drinks  . Drug use: No  . Sexual activity: Not on file  Other Topics Concern  . Not on file  Social History Narrative  . Not on file   Social Determinants of Health   Financial Resource Strain:   . Difficulty of Paying Living Expenses:   Food Insecurity:   . Worried About Charity fundraiser in the Last Year:   . Arboriculturist in the Last Year:   Transportation Needs:   . Film/video editor (Medical):   Marland Kitchen Lack of Transportation (Non-Medical):   Physical Activity:   . Days of Exercise per Week:   . Minutes of Exercise per Session:   Stress:   . Feeling of Stress :   Social Connections:   . Frequency of Communication with Friends and Family:   . Frequency of Social Gatherings with Friends and Family:   . Attends Religious Services:   . Active Member of Clubs or Organizations:   . Attends Archivist Meetings:   Marland Kitchen Marital Status:   Intimate Partner Violence:   . Fear of Current or Ex-Partner:   . Emotionally Abused:   Marland Kitchen Physically Abused:   . Sexually Abused:      ROS- All systems are reviewed and negative except as per the HPI above.  Physical Exam: Vitals:   09/23/19 1440  BP: 116/60  Pulse: 79  Weight: (!) 158.8 kg  Height: 6' (1.829 m)    GEN- The patient is well appearing obese elderly male, alert and oriented x 3 today.   HEENT-head normocephalic, atraumatic, sclera clear, conjunctiva pink, hearing intact, trachea midline. Lungs- Clear to ausculation bilaterally, normal work of breathing Heart- Regular rate and rhythm, no murmurs, rubs or gallops  GI- soft, NT, ND, + BS Extremities- no  clubbing, cyanosis, or edema MS- no significant deformity or atrophy Skin- psoriatic plaques Psych- euthymic mood, full affect Neuro- strength and sensation are intact   Wt Readings from Last 3 Encounters:  09/23/19 (!) 158.8 kg  06/24/19 (!) 157.9 kg  03/18/19 (!) 157.9 kg    EKG today demonstrates A sense V paced rhythm HR 79, PR 202, QRS 162, QTc 541   Echo 07/17/17 demonstrated  - Left ventricle: Systolic function was normal. The estimated   ejection fraction was in the range of 50% to 55%. - Aortic valve: AV is thickened, calcified with restricted motion   Peak and mean gradients through  the valve are 21 and 12 mm Hg   respectively consistent with mild AS. - Mitral valve: Calcified annulus. Mildly thickened leaflets . - Left atrium: The atrium was moderately dilated. - Right ventricle: RVEF is difficult to assess even with use of   Definity Overall appears moderately depressed. The cavity size   was mildly dilated. - Right atrium: The atrium was moderately dilated.  Impressions:  - Poor acoustic windows limit study.   Epic records are reviewed at length today  Assessment and Plan:  1. Paroxysmal atrial fibrillation Patient's AF burden down from 10% to 5.8%. No new episodes seen on last interrogation.  Patient maintaining SR. Continue Eliquis 5 mg BID.  Lifestyle changes as below.  This patients CHA2DS2-VASc Score and unadjusted Ischemic Stroke Rate (% per year) is equal to 4.8 % stroke rate/year from a score of 4  Above score calculated as 1 point each if present [CHF, HTN, DM, Vascular=MI/PAD/Aortic Plaque, Age if 65-74, or Male] Above score calculated as 2 points each if present [Age > 75, or Stroke/TIA/TE]  2. Obesity Body mass index is 47.47 kg/m. Lifestyle modification was discussed and encouraged including regular physical activity and weight reduction.  3. Obstructive sleep apnea Patient is not on CPAP therapy.   4. CHB S/p PPM, followed by Dr  Caryl Comes and the device clinic.    Follow up with Dr Caryl Comes per recall.    [facility] [address] [place], [place] [address] [phone] 09/23/2019 3:02 PM

## 2019-10-21 DIAGNOSIS — R42 Dizziness and giddiness: Secondary | ICD-10-CM | POA: Diagnosis not present

## 2019-10-21 DIAGNOSIS — F411 Generalized anxiety disorder: Secondary | ICD-10-CM | POA: Diagnosis not present

## 2019-10-21 DIAGNOSIS — I10 Essential (primary) hypertension: Secondary | ICD-10-CM | POA: Diagnosis not present

## 2019-10-21 DIAGNOSIS — M25562 Pain in left knee: Secondary | ICD-10-CM | POA: Diagnosis not present

## 2019-11-08 ENCOUNTER — Other Ambulatory Visit: Payer: Self-pay | Admitting: Internal Medicine

## 2019-11-09 NOTE — Telephone Encounter (Signed)
Pt last saw Clint Fenton, PA on 09/23/19, last labs 03/18/19 Creat 1.72, age 79, weight 158.8kg, based on specified criteria pt is on appropriate dosage of Eliquis 5mg  BID.  Will refill rx.

## 2019-11-10 ENCOUNTER — Telehealth: Payer: Self-pay | Admitting: Internal Medicine

## 2019-11-10 NOTE — Telephone Encounter (Signed)
Patient calling the office for samples of medication:   1.  What medication and dosage are you requesting samples for?  ELIQUIS 5 MG TABS tablet  2.  Are you currently out of this medication? No not yet

## 2019-11-10 NOTE — Telephone Encounter (Signed)
Pt is requesting samples of Eliquis. Jeani Hawking, LPN, can you please advise on this matter? Thanks

## 2019-11-11 NOTE — Telephone Encounter (Signed)
**Note De-Identified  Obfuscation** The pt states that his Eliquis has gone up to $486/90 day supply from $145/90 day supply and that he cannot afford it.  I explained [employer] pt asst's program to him and strongly encouraged him to apply but he is not interested at all and states that he read a pt asst application in the past concerning another medication he was taking and "it didn't look like I could get it".  I advised him that I will be happy to help him fill the application out and that all he would need is his 2020 proof of income and his 2021 out of pocket expense report from his pharmacy but again he refused.  I advised him that if he is willing to apply for pt asst we can give him Eliquis samples until we get a determination from [employer] and he said "Im not gonna fool with it".  He is aware that I am forwarding this note to Dr Caryl Comes and PharmD as he states "if Elfredia Nevins is cheaper Ill take that".

## 2019-11-11 NOTE — Telephone Encounter (Signed)
Called patient to discuss switching to warfarin. No answer. Left VM to call back

## 2019-11-12 NOTE — Telephone Encounter (Signed)
Spoke with pt. Discussed changing to warfarin. He does not wish to come in for frequent INR monitoring and will stay on Eliquis instead. Asked if we have samples - will leave 4 boxes downstairs for pt. Advised him we do not regularly provide samples and he will still need to pick up refills of Eliquis at the pharmacy. He verbalized understanding.

## 2019-11-12 NOTE — Telephone Encounter (Signed)
Thnaks

## 2019-11-12 NOTE — Telephone Encounter (Signed)
This is fine - samples were placed up front so pt can pick them up at his convenience.

## 2019-11-12 NOTE — Telephone Encounter (Signed)
[name] is calling back requesting his samples be held until Monday due to him having car trouble. Please advise.

## 2019-11-23 ENCOUNTER — Ambulatory Visit (INDEPENDENT_AMBULATORY_CARE_PROVIDER_SITE_OTHER): Payer: Medicare HMO | Admitting: *Deleted

## 2019-11-23 DIAGNOSIS — I441 Atrioventricular block, second degree: Secondary | ICD-10-CM

## 2019-11-23 LAB — CUP PACEART REMOTE DEVICE CHECK
Battery Remaining Longevity: 104 mo
Battery Remaining Percentage: 95.5 %
Battery Voltage: 3.01 V
Brady Statistic AP VP Percent: 4 %
Brady Statistic AP VS Percent: 1 %
Brady Statistic AS VP Percent: 96 %
Brady Statistic AS VS Percent: 1 %
Brady Statistic RA Percent Paced: 3.6 %
Brady Statistic RV Percent Paced: 99 %
Date Time Interrogation Session: 20210622040015
Implantable Lead Implant Date: 20190624
Implantable Lead Implant Date: 20190624
Implantable Lead Location: 753859
Implantable Lead Location: 753860
Implantable Lead Model: 5076
Implantable Lead Model: 5076
Implantable Pulse Generator Implant Date: 20190624
Lead Channel Impedance Value: 440 Ohm
Lead Channel Impedance Value: 480 Ohm
Lead Channel Pacing Threshold Amplitude: 0.75 V
Lead Channel Pacing Threshold Amplitude: 0.75 V
Lead Channel Pacing Threshold Pulse Width: 0.5 ms
Lead Channel Pacing Threshold Pulse Width: 0.5 ms
Lead Channel Sensing Intrinsic Amplitude: 12 mV
Lead Channel Sensing Intrinsic Amplitude: 3.2 mV
Lead Channel Setting Pacing Amplitude: 2 V
Lead Channel Setting Pacing Amplitude: 2.5 V
Lead Channel Setting Pacing Pulse Width: 0.5 ms
Lead Channel Setting Sensing Sensitivity: 2 mV
Pulse Gen Model: 2272
Pulse Gen Serial Number: 9033095

## 2019-11-24 NOTE — Progress Notes (Signed)
Remote pacemaker transmission.   

## 2019-12-08 DIAGNOSIS — M25511 Pain in right shoulder: Secondary | ICD-10-CM | POA: Diagnosis not present

## 2019-12-08 DIAGNOSIS — I129 Hypertensive chronic kidney disease with stage 1 through stage 4 chronic kidney disease, or unspecified chronic kidney disease: Secondary | ICD-10-CM | POA: Diagnosis not present

## 2019-12-08 DIAGNOSIS — E1129 Type 2 diabetes mellitus with other diabetic kidney complication: Secondary | ICD-10-CM | POA: Diagnosis not present

## 2019-12-08 DIAGNOSIS — I4891 Unspecified atrial fibrillation: Secondary | ICD-10-CM | POA: Diagnosis not present

## 2019-12-14 DIAGNOSIS — M25511 Pain in right shoulder: Secondary | ICD-10-CM | POA: Diagnosis not present

## 2019-12-15 DIAGNOSIS — I129 Hypertensive chronic kidney disease with stage 1 through stage 4 chronic kidney disease, or unspecified chronic kidney disease: Secondary | ICD-10-CM | POA: Diagnosis not present

## 2019-12-15 DIAGNOSIS — E039 Hypothyroidism, unspecified: Secondary | ICD-10-CM | POA: Diagnosis not present

## 2019-12-15 DIAGNOSIS — E1129 Type 2 diabetes mellitus with other diabetic kidney complication: Secondary | ICD-10-CM | POA: Diagnosis not present

## 2019-12-15 DIAGNOSIS — F39 Unspecified mood [affective] disorder: Secondary | ICD-10-CM | POA: Diagnosis not present

## 2019-12-15 DIAGNOSIS — Z0000 Encounter for general adult medical examination without abnormal findings: Secondary | ICD-10-CM | POA: Diagnosis not present

## 2019-12-15 DIAGNOSIS — D6869 Other thrombophilia: Secondary | ICD-10-CM | POA: Diagnosis not present

## 2019-12-15 DIAGNOSIS — E7800 Pure hypercholesterolemia, unspecified: Secondary | ICD-10-CM | POA: Diagnosis not present

## 2019-12-15 DIAGNOSIS — I4891 Unspecified atrial fibrillation: Secondary | ICD-10-CM | POA: Diagnosis not present

## 2019-12-15 DIAGNOSIS — N400 Enlarged prostate without lower urinary tract symptoms: Secondary | ICD-10-CM | POA: Diagnosis not present

## 2019-12-15 DIAGNOSIS — N183 Chronic kidney disease, stage 3 unspecified: Secondary | ICD-10-CM | POA: Diagnosis not present

## 2019-12-15 DIAGNOSIS — F411 Generalized anxiety disorder: Secondary | ICD-10-CM | POA: Diagnosis not present

## 2020-02-12 DIAGNOSIS — Z950 Presence of cardiac pacemaker: Secondary | ICD-10-CM | POA: Insufficient documentation

## 2020-02-12 DIAGNOSIS — I442 Atrioventricular block, complete: Secondary | ICD-10-CM | POA: Insufficient documentation

## 2020-02-14 ENCOUNTER — Ambulatory Visit: Payer: Medicare HMO | Admitting: Internal Medicine

## 2020-02-14 ENCOUNTER — Encounter: Payer: Self-pay | Admitting: Internal Medicine

## 2020-02-14 ENCOUNTER — Other Ambulatory Visit: Payer: Self-pay

## 2020-02-14 VITALS — BP 130/66 | HR 78 | Ht 72.0 in | Wt 350.0 lb

## 2020-02-14 DIAGNOSIS — Z950 Presence of cardiac pacemaker: Secondary | ICD-10-CM | POA: Diagnosis not present

## 2020-02-14 DIAGNOSIS — I442 Atrioventricular block, complete: Secondary | ICD-10-CM

## 2020-02-14 DIAGNOSIS — I480 Paroxysmal atrial fibrillation: Secondary | ICD-10-CM | POA: Diagnosis not present

## 2020-02-14 NOTE — Progress Notes (Signed)
Patient Care Team: [name], MD as PCP - General (Family Medicine) Troy Sine, MD as PCP - Cardiology (Cardiology)   HPI  [name] is a 79 y.o. 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  Subsequently identified as having SCAF  Date Cr K  12/18 1.6 4.3  6/19  1.84 5.1   He has untreated sleep apnea because of intolerance to CPAP  Markedly limited exercise tolerance at less than 30-50 feet. Edema.  Diet is relatively salt replete.  No chest pain.  Lightheadedness with standing.  Some presyncope.  Weight stable  Struggling with urine flow   Records and Results Reviewed   Past Medical History:  Diagnosis Date  . CKD (chronic kidney disease)   . DM type 2 (diabetes mellitus, type 2) ([name])   . ED (erectile dysfunction)   . Fatty liver   . GERD (gastroesophageal reflux disease)   . Gout   . H/O adenomatous polyp of colon   . HLD (hyperlipidemia)   . HTN (hypertension)   . Hypothyroid   . Panic attacks   . Psoriasis     Past Surgical History:  Procedure Laterality Date  . PACEMAKER IMPLANT N/A 11/24/2017   Procedure: PACEMAKER IMPLANT;  Surgeon: Deboraha Sprang, MD;  Location: [facility];  Service: Cardiovascular;  Laterality: N/A;    Current Meds  Medication Sig  . ACCU-CHEK SMARTVIEW test strip   . ALPRAZolam (XANAX) 1 MG tablet Take 0.5-1 mg by mouth daily as needed for anxiety.   Marland Kitchen atorvastatin (LIPITOR) 40 MG tablet Take 20 mg by mouth daily. Taking 1/2 tablet daily  . Cholecalciferol 25 MCG (1000 UT) tablet Take 1,000 Units by mouth daily.  Marland Kitchen ELIQUIS 5 MG TABS tablet TAKE 1 TABLET (5 MG TOTAL) BY MOUTH 2 (TWO) TIMES DAILY.  Marland Kitchen levothyroxine (SYNTHROID, LEVOTHROID) 175 MCG tablet Take 175 mcg by mouth daily.   Marland Kitchen losartan (COZAAR) 50 MG tablet Take 1 tablet (50 mg total) by mouth daily.  . metFORMIN (GLUCOPHAGE)  500 MG tablet Take by mouth daily.  Marland Kitchen PARoxetine (PAXIL) 40 MG tablet Take 40 mg by mouth daily.  [name] Glycol-Propyl Glycol (SYSTANE ULTRA) 0.4-0.3 % SOLN Place 1 drop into both eyes daily as needed (dry eyes).  . RESTASIS 0.05 % ophthalmic emulsion     Allergies  Allergen Reactions  . Hctz [Hydrochlorothiazide]     Gout flare   . Lexapro [Escitalopram Oxalate]     Ineffective for anxiety   . Lisinopril     angioedema       Review of Systems negative except from HPI and PMH  Physical Exam    BP 130/66   Pulse 78   Ht 6' (1.829 m)   Wt (!) 350 lb (158.8 kg)   SpO2 95%   BMI 47.47 kg/m  Well developed and Morbidly obese in no acute distress HENT normal Neck supple  Clear Device pocket well healed; without hematoma or erythema.  There is no tethering  Regular rate and rhythm, no  * murmur Abd-soft with active BS No Clubbing cyanosis 2-3+ edema Skin-warm and dry psoriasis A & Oriented  Grossly normal sensory and motor function  ECG P synchronous pacing at 78 Intervals 19/16/47    Assessment and  Plan  Right bundle branch block left anterior fascicular block  Complete heart block  Morbid obesity  Pacemaker [employer]  2019    Obstructive sleep apnea  Pulmonary hypertension/cor pulmonale/right heart failure  Orthostatic lightheadedness  Atrial fibrillation  SCAF    Atrial tachycardia-nonsustained  Volume overload.  We will increase his diuretics from 40--80 mg daily for 3 days.  He asked about help with urine stream; asked that he follow-up with his PCP but discussed the potential impact on orthostatic intolerance  Discussed undergoing nonpharmacological therapies.  Demonstrated isometric contraction, abdominal binders and thigh sleeves.  Device function normal

## 2020-02-14 NOTE — Patient Instructions (Signed)
Medication Instructions:  Your physician has recommended you make the following change in your medication:   ** Begin taking Losartan at bedtime.  ** Increase your Lasix to 80mg  by mouth daily x 3 days then return to your normal dose.  *If you need a refill on your cardiac medications before your next appointment, please call your pharmacy*   Lab Work: None ordered.  If you have labs (blood work) drawn today and your tests are completely normal, you will receive your results only by: Marland Kitchen MyChart Message (if you have MyChart) OR . A paper copy in the mail If you have any lab test that is abnormal or we need to change your treatment, we will call you to review the results.   Testing/Procedures: None ordered.    Follow-Up: At [facility], you and your health needs are our priority.  As part of our continuing mission to provide you with exceptional heart care, we have created designated Provider Care Teams.  These Care Teams include your primary Cardiologist (physician) and Advanced Practice Providers (APPs -  Physician Assistants and Nurse Practitioners) who all work together to provide you with the care you need, when you need it.  We recommend signing up for the patient portal called "MyChart".  Sign up information is provided on this After Visit Summary.  MyChart is used to connect with patients for Virtual Visits (Telemedicine).  Patients are able to view lab/test results, encounter notes, upcoming appointments, etc.  Non-urgent messages can be sent to your provider as well.   To learn more about what you can do with MyChart, go to [email or web address].    Your next appointment:   12 month(s)  The format for your next appointment:   In Person  Provider:   Dr Caryl Comes    Other Instructions Purchase Abdominal binder and thigh sleeves as discussed by Dr Caryl Comes

## 2020-02-15 LAB — CUP PACEART INCLINIC DEVICE CHECK
Battery Remaining Longevity: 109 mo
Battery Voltage: 3.01 V
Brady Statistic RA Percent Paced: 3.7 %
Brady Statistic RV Percent Paced: 99.68 %
Date Time Interrogation Session: 20210913144700
Implantable Lead Implant Date: 20190624
Implantable Lead Implant Date: 20190624
Implantable Lead Location: 753859
Implantable Lead Location: 753860
Implantable Lead Model: 5076
Implantable Lead Model: 5076
Implantable Pulse Generator Implant Date: 20190624
Lead Channel Impedance Value: 475 Ohm
Lead Channel Impedance Value: 487.5 Ohm
Lead Channel Pacing Threshold Amplitude: 0.75 V
Lead Channel Pacing Threshold Amplitude: 0.75 V
Lead Channel Pacing Threshold Pulse Width: 0.5 ms
Lead Channel Pacing Threshold Pulse Width: 0.5 ms
Lead Channel Sensing Intrinsic Amplitude: 12 mV
Lead Channel Sensing Intrinsic Amplitude: 4.9 mV
Lead Channel Setting Pacing Amplitude: 2 V
Lead Channel Setting Pacing Amplitude: 2.5 V
Lead Channel Setting Pacing Pulse Width: 0.5 ms
Lead Channel Setting Sensing Sensitivity: 2 mV
Pulse Gen Model: 2272
Pulse Gen Serial Number: 9033095

## 2020-02-22 ENCOUNTER — Ambulatory Visit (INDEPENDENT_AMBULATORY_CARE_PROVIDER_SITE_OTHER): Payer: Medicare HMO | Admitting: *Deleted

## 2020-02-22 DIAGNOSIS — I442 Atrioventricular block, complete: Secondary | ICD-10-CM | POA: Diagnosis not present

## 2020-02-22 LAB — CUP PACEART REMOTE DEVICE CHECK
Battery Remaining Longevity: 106 mo
Battery Remaining Percentage: 95.5 %
Battery Voltage: 3.01 V
Brady Statistic AP VP Percent: 4.6 %
Brady Statistic AP VS Percent: 1 %
Brady Statistic AS VP Percent: 95 %
Brady Statistic AS VS Percent: 1 %
Brady Statistic RA Percent Paced: 4.5 %
Brady Statistic RV Percent Paced: 99 %
Date Time Interrogation Session: 20210921055532
Implantable Lead Implant Date: 20190624
Implantable Lead Implant Date: 20190624
Implantable Lead Location: 753859
Implantable Lead Location: 753860
Implantable Lead Model: 5076
Implantable Lead Model: 5076
Implantable Pulse Generator Implant Date: 20190624
Lead Channel Impedance Value: 450 Ohm
Lead Channel Impedance Value: 490 Ohm
Lead Channel Pacing Threshold Amplitude: 0.75 V
Lead Channel Pacing Threshold Amplitude: 0.75 V
Lead Channel Pacing Threshold Pulse Width: 0.5 ms
Lead Channel Pacing Threshold Pulse Width: 0.5 ms
Lead Channel Sensing Intrinsic Amplitude: 12 mV
Lead Channel Sensing Intrinsic Amplitude: 5 mV
Lead Channel Setting Pacing Amplitude: 2 V
Lead Channel Setting Pacing Amplitude: 2.5 V
Lead Channel Setting Pacing Pulse Width: 0.5 ms
Lead Channel Setting Sensing Sensitivity: 2 mV
Pulse Gen Model: 2272
Pulse Gen Serial Number: 9033095

## 2020-02-23 NOTE — Progress Notes (Signed)
Remote pacemaker transmission.   

## 2020-02-25 ENCOUNTER — Telehealth: Payer: Self-pay | Admitting: Internal Medicine

## 2020-02-25 NOTE — Telephone Encounter (Addendum)
Spoke with pt who states he continues to have some mild swelling in his feet and legs despite increasing his Lasix to 80mg  daily x 3 days after his last visit with Dr Caryl Comes on 02/14/2020. Pt denies any current SOB or CP but would like to know if there is something else that might help with the swelling.  Discussed monitoring sodium intake and "hidden salt" in foods as well as limiting soda.  Encouraged pt to consider daily weights to help accurately assess fluid retention.  Will forward information to Dr Caryl Comes for review and recommendation. Pt verbalizes understanding and agrees with current plan.

## 2020-02-25 NOTE — Telephone Encounter (Signed)
Pt c/o swelling: STAT is pt has developed SOB within 24 hours  1) How much weight have you gained and in what time span? Pt does not take daily weights   2) If swelling, where is the swelling located? Feet and legs  3) Are you currently taking a fluid pill? Yes   4) Are you currently SOB? no  5) Do you have a log of your daily weights (if so, list)? no  6) Have you gained 3 pounds in a day or 5 pounds in a week? Pt is not sure   7) Have you traveled recently? No  Pt states he is still experiencing swelling. At his appt with Dr. Caryl Comes 09/13 Dr. Caryl Comes mentioned another medicine to help with swelling. The patient states when he takes 80 mg of lasix it helps but the swelling does not go away completely.

## 2020-02-28 NOTE — Telephone Encounter (Signed)
Spoke with pt and advised per Dr Caryl Comes he may increase his Furosemide to 120mg  qam x 3 days then return to normal dosing.  Pt states he has been trying to keep feet elevated more in recliner which has helped with additional swelling as well as cutting his sodium intake so he may "try to hold off" taking the extra Lasix at this time.  Pt thanked Therapist, sports for call.

## 2020-02-28 NOTE — Telephone Encounter (Signed)
Lots of salt in his diet, so lets try 120 lasix qam x 3 d Thanks steve

## 2020-03-16 DIAGNOSIS — Z23 Encounter for immunization: Secondary | ICD-10-CM | POA: Diagnosis not present

## 2020-03-16 DIAGNOSIS — M549 Dorsalgia, unspecified: Secondary | ICD-10-CM | POA: Diagnosis not present

## 2020-05-24 ENCOUNTER — Ambulatory Visit (INDEPENDENT_AMBULATORY_CARE_PROVIDER_SITE_OTHER): Payer: Medicare HMO

## 2020-05-24 DIAGNOSIS — I442 Atrioventricular block, complete: Secondary | ICD-10-CM

## 2020-05-24 LAB — CUP PACEART REMOTE DEVICE CHECK
Battery Remaining Longevity: 106 mo
Battery Remaining Percentage: 95.5 %
Battery Voltage: 3.01 V
Brady Statistic AP VP Percent: 4 %
Brady Statistic AP VS Percent: 1 %
Brady Statistic AS VP Percent: 96 %
Brady Statistic AS VS Percent: 1 %
Brady Statistic RA Percent Paced: 3.8 %
Brady Statistic RV Percent Paced: 99 %
Date Time Interrogation Session: 20211222020015
Implantable Lead Implant Date: 20190624
Implantable Lead Implant Date: 20190624
Implantable Lead Location: 753859
Implantable Lead Location: 753860
Implantable Lead Model: 5076
Implantable Lead Model: 5076
Implantable Pulse Generator Implant Date: 20190624
Lead Channel Impedance Value: 460 Ohm
Lead Channel Impedance Value: 480 Ohm
Lead Channel Pacing Threshold Amplitude: 0.75 V
Lead Channel Pacing Threshold Amplitude: 0.75 V
Lead Channel Pacing Threshold Pulse Width: 0.5 ms
Lead Channel Pacing Threshold Pulse Width: 0.5 ms
Lead Channel Sensing Intrinsic Amplitude: 12 mV
Lead Channel Sensing Intrinsic Amplitude: 3.4 mV
Lead Channel Setting Pacing Amplitude: 2 V
Lead Channel Setting Pacing Amplitude: 2.5 V
Lead Channel Setting Pacing Pulse Width: 0.5 ms
Lead Channel Setting Sensing Sensitivity: 2 mV
Pulse Gen Model: 2272
Pulse Gen Serial Number: 9033095

## 2020-06-07 NOTE — Progress Notes (Signed)
Remote pacemaker transmission.   

## 2020-06-21 ENCOUNTER — Other Ambulatory Visit: Payer: Self-pay | Admitting: Physician Assistant

## 2020-06-21 ENCOUNTER — Ambulatory Visit
Admission: RE | Admit: 2020-06-21 | Discharge: 2020-06-21 | Disposition: A | Discharge status: Home / Self Care | Payer: Medicare HMO | Source: Ambulatory Visit | Attending: Physician Assistant | Admitting: Physician Assistant

## 2020-06-21 DIAGNOSIS — R059 Cough, unspecified: Secondary | ICD-10-CM | POA: Diagnosis not present

## 2020-06-21 DIAGNOSIS — E039 Hypothyroidism, unspecified: Secondary | ICD-10-CM | POA: Diagnosis not present

## 2020-06-21 DIAGNOSIS — E1169 Type 2 diabetes mellitus with other specified complication: Secondary | ICD-10-CM | POA: Diagnosis not present

## 2020-06-21 DIAGNOSIS — R0602 Shortness of breath: Secondary | ICD-10-CM

## 2020-06-21 DIAGNOSIS — I4891 Unspecified atrial fibrillation: Secondary | ICD-10-CM | POA: Diagnosis not present

## 2020-06-21 DIAGNOSIS — R609 Edema, unspecified: Secondary | ICD-10-CM | POA: Diagnosis not present

## 2020-06-21 DIAGNOSIS — I129 Hypertensive chronic kidney disease with stage 1 through stage 4 chronic kidney disease, or unspecified chronic kidney disease: Secondary | ICD-10-CM | POA: Diagnosis not present

## 2020-06-21 DIAGNOSIS — E7800 Pure hypercholesterolemia, unspecified: Secondary | ICD-10-CM | POA: Diagnosis not present

## 2020-06-21 DIAGNOSIS — E1129 Type 2 diabetes mellitus with other diabetic kidney complication: Secondary | ICD-10-CM | POA: Diagnosis not present

## 2020-06-21 DIAGNOSIS — F411 Generalized anxiety disorder: Secondary | ICD-10-CM | POA: Diagnosis not present

## 2020-06-21 DIAGNOSIS — N183 Chronic kidney disease, stage 3 unspecified: Secondary | ICD-10-CM | POA: Diagnosis not present

## 2020-07-05 ENCOUNTER — Other Ambulatory Visit: Payer: Self-pay | Admitting: Family Medicine

## 2020-07-05 ENCOUNTER — Ambulatory Visit
Admission: RE | Admit: 2020-07-05 | Discharge: 2020-07-05 | Disposition: A | Discharge status: Home / Self Care | Payer: Medicare HMO | Source: Ambulatory Visit | Attending: Family Medicine | Admitting: Family Medicine

## 2020-07-05 DIAGNOSIS — M1711 Unilateral primary osteoarthritis, right knee: Secondary | ICD-10-CM | POA: Diagnosis not present

## 2020-07-05 DIAGNOSIS — I129 Hypertensive chronic kidney disease with stage 1 through stage 4 chronic kidney disease, or unspecified chronic kidney disease: Secondary | ICD-10-CM | POA: Diagnosis not present

## 2020-07-05 DIAGNOSIS — M25561 Pain in right knee: Secondary | ICD-10-CM

## 2020-07-05 DIAGNOSIS — F411 Generalized anxiety disorder: Secondary | ICD-10-CM | POA: Diagnosis not present

## 2020-07-05 DIAGNOSIS — R0609 Other forms of dyspnea: Secondary | ICD-10-CM | POA: Diagnosis not present

## 2020-07-12 DIAGNOSIS — M25561 Pain in right knee: Secondary | ICD-10-CM | POA: Diagnosis not present

## 2020-07-12 DIAGNOSIS — Z6841 Body Mass Index (BMI) 40.0 and over, adult: Secondary | ICD-10-CM | POA: Diagnosis not present

## 2020-08-07 DIAGNOSIS — F410 Panic disorder [episodic paroxysmal anxiety] without agoraphobia: Secondary | ICD-10-CM | POA: Diagnosis not present

## 2020-08-09 DIAGNOSIS — F410 Panic disorder [episodic paroxysmal anxiety] without agoraphobia: Secondary | ICD-10-CM | POA: Diagnosis not present

## 2020-08-11 ENCOUNTER — Other Ambulatory Visit: Payer: Self-pay | Admitting: Gastroenterology

## 2020-08-11 DIAGNOSIS — K219 Gastro-esophageal reflux disease without esophagitis: Secondary | ICD-10-CM | POA: Diagnosis not present

## 2020-08-11 DIAGNOSIS — R1310 Dysphagia, unspecified: Secondary | ICD-10-CM | POA: Diagnosis not present

## 2020-08-11 DIAGNOSIS — K2100 Gastro-esophageal reflux disease with esophagitis, without bleeding: Secondary | ICD-10-CM

## 2020-08-16 ENCOUNTER — Other Ambulatory Visit: Payer: Medicare HMO

## 2020-08-16 ENCOUNTER — Ambulatory Visit
Admission: RE | Admit: 2020-08-16 | Discharge: 2020-08-16 | Disposition: A | Discharge status: Home / Self Care | Payer: Medicare HMO | Source: Ambulatory Visit | Attending: Gastroenterology | Admitting: Gastroenterology

## 2020-08-16 DIAGNOSIS — R1310 Dysphagia, unspecified: Secondary | ICD-10-CM | POA: Diagnosis not present

## 2020-08-16 DIAGNOSIS — K2100 Gastro-esophageal reflux disease with esophagitis, without bleeding: Secondary | ICD-10-CM

## 2020-08-18 ENCOUNTER — Other Ambulatory Visit: Payer: Self-pay | Admitting: Gastroenterology

## 2020-08-18 ENCOUNTER — Telehealth: Payer: Self-pay | Admitting: *Deleted

## 2020-08-18 NOTE — Telephone Encounter (Signed)
Patient with diagnosis of afib on Eliquis for anticoagulation.    Procedure: EGD Date of procedure: 09/18/20  CHA2DS2-VASc Score = 4  This indicates a 4.8% annual risk of stroke. The patient's score is based upon: CHF History: No HTN History: Yes Diabetes History: Yes Stroke History: No Vascular Disease History: No Age Score: 2 Gender Score: 0     CrCl 47.9 ml/min  Per office protocol, patient can hold Eliquis for 1-2 days prior to procedure.

## 2020-08-18 NOTE — Telephone Encounter (Signed)
   Primary Cardiologist: Shelva Majestic, MD  Chart reviewed as part of pre-operative protocol coverage.  Attempted to contact patient. Phone rang, however no voicemail to leave a message. Patient will need call back.   Abigail Butts, PA-C 08/18/2020, 2:17 PM

## 2020-08-18 NOTE — Telephone Encounter (Signed)
   [facility] Pre-operative Risk Assessment    HEARTCARE STAFF: - Please ensure there is not already an duplicate clearance open for this procedure. - Under Visit Info/Reason for Call, type in Other and utilize the format Clearance MM/DD/YY or Clearance TBD. Do not use dashes or single digits. - If request is for dental extraction, please clarify the # of teeth to be extracted.  Request for surgical clearance:  1. What type of surgery is being performed? EGD   2. When is this surgery scheduled? 09/18/20   3. What type of clearance is required (medical clearance vs. Pharmacy clearance to hold med vs. Both)? BOTH  4. Are there any medications that need to be held prior to surgery and how long? ELIQUIS   5. Practice name and name of physician performing surgery? [facility]; DR. MAGOD   6. What is the office phone number? [phone]   7.   What is the office fax number? [phone]  8.   Anesthesia type (None, local, MAC, general) ? PROPOFOL   Julaine Hua 08/18/2020, 12:13 PM  _________________________________________________________________   (provider comments below)

## 2020-08-21 NOTE — Telephone Encounter (Signed)
   Primary Cardiologist: Shelva Majestic, MD  Chart reviewed as part of pre-operative protocol coverage. Patient was contacted 08/21/2020 in reference to pre-operative risk assessment for pending surgery as outlined below.  [name] was last seen on 02/2020 by Dr. Caryl Comes with cardiac history to include RBBB/LAFB, CHB, PPM, obesity, pulm HTN, cor pulmonale, right heart failure, orthostatic hypotension. At last OV his Lasix was increased briefly for some volume overload. (Note states, "Markedly limited exercise tolerance at less than 30-50 feet. Edema.") Per discussion with the patient he denies any change from that. Dizziness improved. DOE and edema persist.  Given these symptoms, unable to specifically clear for EGD without input from MD. Will route to Dr. Caryl Comes for his input on medically clearing patient for procedure. Dr. Caryl Comes - Please route response to P CV DIV PREOP (the pre-op pool). Thank you.   [name], PA-C 08/21/2020, 11:38 AM

## 2020-08-22 NOTE — Telephone Encounter (Signed)
Pt reports his GI provider is Dr Watt Climes.

## 2020-08-22 NOTE — Telephone Encounter (Signed)
Hinton Dyer, my hat is off to you.  I called the GI guy.  Sort of a soft indication and I think given his class IIIb heart failure may be not so clearly necessary.  GI is going to follow-up with the patient and redirect him to ENT.  Thanks

## 2020-08-22 NOTE — Telephone Encounter (Signed)
M  CAN We find out his GI MD name   Thanks

## 2020-08-23 ENCOUNTER — Ambulatory Visit (INDEPENDENT_AMBULATORY_CARE_PROVIDER_SITE_OTHER): Payer: Medicare HMO

## 2020-08-23 DIAGNOSIS — I442 Atrioventricular block, complete: Secondary | ICD-10-CM

## 2020-08-23 DIAGNOSIS — F410 Panic disorder [episodic paroxysmal anxiety] without agoraphobia: Secondary | ICD-10-CM | POA: Diagnosis not present

## 2020-08-23 LAB — CUP PACEART REMOTE DEVICE CHECK
Battery Remaining Longevity: 104 mo
Battery Remaining Percentage: 95.5 %
Battery Voltage: 3.01 V
Brady Statistic AP VP Percent: 10 %
Brady Statistic AP VS Percent: 1 %
Brady Statistic AS VP Percent: 90 %
Brady Statistic AS VS Percent: 1 %
Brady Statistic RA Percent Paced: 9.8 %
Brady Statistic RV Percent Paced: 99 %
Date Time Interrogation Session: 20220323020014
Implantable Lead Implant Date: 20190624
Implantable Lead Implant Date: 20190624
Implantable Lead Location: 753859
Implantable Lead Location: 753860
Implantable Lead Model: 5076
Implantable Lead Model: 5076
Implantable Pulse Generator Implant Date: 20190624
Lead Channel Impedance Value: 450 Ohm
Lead Channel Impedance Value: 480 Ohm
Lead Channel Pacing Threshold Amplitude: 0.75 V
Lead Channel Pacing Threshold Amplitude: 0.75 V
Lead Channel Pacing Threshold Pulse Width: 0.5 ms
Lead Channel Pacing Threshold Pulse Width: 0.5 ms
Lead Channel Sensing Intrinsic Amplitude: 3.1 mV
Lead Channel Sensing Intrinsic Amplitude: 5.5 mV
Lead Channel Setting Pacing Amplitude: 2 V
Lead Channel Setting Pacing Amplitude: 2.5 V
Lead Channel Setting Pacing Pulse Width: 0.5 ms
Lead Channel Setting Sensing Sensitivity: 2 mV
Pulse Gen Model: 2272
Pulse Gen Serial Number: 9033095

## 2020-08-23 NOTE — Telephone Encounter (Addendum)
See note below from Dr. Caryl Comes. He spoke with GI. Per his note, " Sort of a soft indication and I think given his class IIIb heart failure may be not so clearly necessary.  GI is going to follow-up with the patient and redirect him to ENT. " Will remove from preop box, can revisit if necessary.  Kerrin Markman PA-C

## 2020-08-28 DIAGNOSIS — F410 Panic disorder [episodic paroxysmal anxiety] without agoraphobia: Secondary | ICD-10-CM | POA: Diagnosis not present

## 2020-09-04 NOTE — Progress Notes (Signed)
Remote pacemaker transmission.   

## 2020-09-18 ENCOUNTER — Ambulatory Visit (HOSPITAL_COMMUNITY): Admit: 2020-09-18 | Payer: Medicare HMO | Admitting: Gastroenterology

## 2020-09-18 ENCOUNTER — Encounter (HOSPITAL_COMMUNITY): Payer: Self-pay

## 2020-09-18 SURGERY — ESOPHAGOGASTRODUODENOSCOPY (EGD) WITH PROPOFOL
Anesthesia: Monitor Anesthesia Care

## 2020-09-20 DIAGNOSIS — F410 Panic disorder [episodic paroxysmal anxiety] without agoraphobia: Secondary | ICD-10-CM | POA: Diagnosis not present

## 2020-09-28 DIAGNOSIS — R070 Pain in throat: Secondary | ICD-10-CM | POA: Diagnosis not present

## 2020-09-28 DIAGNOSIS — K219 Gastro-esophageal reflux disease without esophagitis: Secondary | ICD-10-CM | POA: Diagnosis not present

## 2020-10-11 DIAGNOSIS — F410 Panic disorder [episodic paroxysmal anxiety] without agoraphobia: Secondary | ICD-10-CM | POA: Diagnosis not present

## 2020-10-16 DIAGNOSIS — F410 Panic disorder [episodic paroxysmal anxiety] without agoraphobia: Secondary | ICD-10-CM | POA: Diagnosis not present

## 2020-10-23 DIAGNOSIS — F410 Panic disorder [episodic paroxysmal anxiety] without agoraphobia: Secondary | ICD-10-CM | POA: Diagnosis not present

## 2020-11-03 ENCOUNTER — Telehealth: Payer: Self-pay | Admitting: Cardiovascular Disease

## 2020-11-03 MED ORDER — ELIQUIS 5 MG PO TABS: 1.0000 | ORAL_TABLET | Freq: Two times a day (BID) | ORAL | 1 refills | Status: DC

## 2020-11-03 NOTE — Telephone Encounter (Signed)
76m, 158.8kg, Creatinine, Serum 1.940 mg/ 06/21/2020, LOVW/ KLEIN 02/14/20

## 2020-11-03 NOTE — Telephone Encounter (Signed)
   *  STAT* If patient is at the pharmacy, call can be transferred to refill team.   1. Which medications need to be refilled? (please list name of each medication and dose if known) ELIQUIS 5 MG TABS tablet  2. Which pharmacy/location (including street and city if local pharmacy) is medication to be sent to? [place], [place]  3. Do they need a 30 day or 90 day supply? 90 days

## 2020-11-06 DIAGNOSIS — F410 Panic disorder [episodic paroxysmal anxiety] without agoraphobia: Secondary | ICD-10-CM | POA: Diagnosis not present

## 2020-11-22 ENCOUNTER — Ambulatory Visit (INDEPENDENT_AMBULATORY_CARE_PROVIDER_SITE_OTHER): Payer: Medicare HMO

## 2020-11-22 DIAGNOSIS — I442 Atrioventricular block, complete: Secondary | ICD-10-CM | POA: Diagnosis not present

## 2020-11-22 LAB — CUP PACEART REMOTE DEVICE CHECK
Battery Remaining Longevity: 72 mo
Battery Remaining Percentage: 70 %
Battery Voltage: 3.01 V
Brady Statistic AP VP Percent: 12 %
Brady Statistic AP VS Percent: 1 %
Brady Statistic AS VP Percent: 88 %
Brady Statistic AS VS Percent: 1 %
Brady Statistic RA Percent Paced: 11 %
Brady Statistic RV Percent Paced: 99 %
Date Time Interrogation Session: 20220622020024
Implantable Lead Implant Date: 20190624
Implantable Lead Implant Date: 20190624
Implantable Lead Location: 753859
Implantable Lead Location: 753860
Implantable Lead Model: 5076
Implantable Lead Model: 5076
Implantable Pulse Generator Implant Date: 20190624
Lead Channel Impedance Value: 460 Ohm
Lead Channel Impedance Value: 490 Ohm
Lead Channel Pacing Threshold Amplitude: 0.75 V
Lead Channel Pacing Threshold Amplitude: 0.75 V
Lead Channel Pacing Threshold Pulse Width: 0.5 ms
Lead Channel Pacing Threshold Pulse Width: 0.5 ms
Lead Channel Sensing Intrinsic Amplitude: 3 mV
Lead Channel Sensing Intrinsic Amplitude: 7.4 mV
Lead Channel Setting Pacing Amplitude: 2 V
Lead Channel Setting Pacing Amplitude: 2.5 V
Lead Channel Setting Pacing Pulse Width: 0.5 ms
Lead Channel Setting Sensing Sensitivity: 2 mV
Pulse Gen Model: 2272
Pulse Gen Serial Number: 9033095

## 2020-11-30 DIAGNOSIS — R070 Pain in throat: Secondary | ICD-10-CM | POA: Diagnosis not present

## 2020-11-30 DIAGNOSIS — K219 Gastro-esophageal reflux disease without esophagitis: Secondary | ICD-10-CM | POA: Diagnosis not present

## 2020-12-05 DIAGNOSIS — F410 Panic disorder [episodic paroxysmal anxiety] without agoraphobia: Secondary | ICD-10-CM | POA: Diagnosis not present

## 2020-12-12 NOTE — Progress Notes (Signed)
Remote pacemaker transmission.   

## 2020-12-19 DIAGNOSIS — F410 Panic disorder [episodic paroxysmal anxiety] without agoraphobia: Secondary | ICD-10-CM | POA: Diagnosis not present

## 2020-12-20 DIAGNOSIS — E039 Hypothyroidism, unspecified: Secondary | ICD-10-CM | POA: Diagnosis not present

## 2020-12-20 DIAGNOSIS — N183 Chronic kidney disease, stage 3 unspecified: Secondary | ICD-10-CM | POA: Diagnosis not present

## 2020-12-20 DIAGNOSIS — F411 Generalized anxiety disorder: Secondary | ICD-10-CM | POA: Diagnosis not present

## 2020-12-20 DIAGNOSIS — I129 Hypertensive chronic kidney disease with stage 1 through stage 4 chronic kidney disease, or unspecified chronic kidney disease: Secondary | ICD-10-CM | POA: Diagnosis not present

## 2020-12-20 DIAGNOSIS — Z1389 Encounter for screening for other disorder: Secondary | ICD-10-CM | POA: Diagnosis not present

## 2020-12-20 DIAGNOSIS — N400 Enlarged prostate without lower urinary tract symptoms: Secondary | ICD-10-CM | POA: Diagnosis not present

## 2020-12-20 DIAGNOSIS — E1129 Type 2 diabetes mellitus with other diabetic kidney complication: Secondary | ICD-10-CM | POA: Diagnosis not present

## 2020-12-20 DIAGNOSIS — E7800 Pure hypercholesterolemia, unspecified: Secondary | ICD-10-CM | POA: Diagnosis not present

## 2020-12-20 DIAGNOSIS — Z0000 Encounter for general adult medical examination without abnormal findings: Secondary | ICD-10-CM | POA: Diagnosis not present

## 2020-12-20 DIAGNOSIS — D6869 Other thrombophilia: Secondary | ICD-10-CM | POA: Diagnosis not present

## 2020-12-20 DIAGNOSIS — F5089 Other specified eating disorder: Secondary | ICD-10-CM | POA: Diagnosis not present

## 2020-12-21 DIAGNOSIS — H5213 Myopia, bilateral: Secondary | ICD-10-CM | POA: Diagnosis not present

## 2021-01-16 DIAGNOSIS — F410 Panic disorder [episodic paroxysmal anxiety] without agoraphobia: Secondary | ICD-10-CM | POA: Diagnosis not present

## 2021-01-18 DIAGNOSIS — F39 Unspecified mood [affective] disorder: Secondary | ICD-10-CM | POA: Diagnosis not present

## 2021-01-18 DIAGNOSIS — F419 Anxiety disorder, unspecified: Secondary | ICD-10-CM | POA: Diagnosis not present

## 2021-01-29 ENCOUNTER — Telehealth: Payer: Self-pay | Admitting: Internal Medicine

## 2021-01-29 NOTE — Telephone Encounter (Signed)
**Note De-Identified  Obfuscation** The pts wife Hoyle Sauer [facility]) stated that the pt was advised by [employer] that his income exceeds their limit and that he would not be approved for asst because of that.  I advised Hoyle Sauer that all anticoagulants currently on the market is name brand and will cost the same as Eliquis and that the only generic is Warfarin. She states the pt is not interested in taking Warfarin.  She put the pt on the phone and he stated that [employer] told him last year that his income was $200 from reaching their cut off limit but he did not apply because he dose not like paperwork.  I encouraged him to call [employer] and talk with them concerning their Eliquis program and his eligibility to be approved for the program and that if it appears that he would be approved to request that they mail him an application to his home.  Per his request I have advised him to call Jeani Hawking at Dr Olin Pia office at [facility] at [phone] when he receives his application and I will assist him in filling it out.  He thanked me and stated that he will call them today.

## 2021-01-29 NOTE — Telephone Encounter (Signed)
Patient calling the office for samples of medication:   1.  What medication and dosage are you requesting samples for? apixaban (ELIQUIS) 5 MG TABS tablet  2.  Are you currently out of this medication? No, has enough for next month, in donut hole

## 2021-02-06 DIAGNOSIS — F419 Anxiety disorder, unspecified: Secondary | ICD-10-CM | POA: Diagnosis not present

## 2021-02-06 DIAGNOSIS — F39 Unspecified mood [affective] disorder: Secondary | ICD-10-CM | POA: Diagnosis not present

## 2021-02-13 DIAGNOSIS — F39 Unspecified mood [affective] disorder: Secondary | ICD-10-CM | POA: Diagnosis not present

## 2021-02-13 DIAGNOSIS — F419 Anxiety disorder, unspecified: Secondary | ICD-10-CM | POA: Diagnosis not present

## 2021-02-21 ENCOUNTER — Ambulatory Visit (INDEPENDENT_AMBULATORY_CARE_PROVIDER_SITE_OTHER): Payer: Medicare HMO

## 2021-02-21 DIAGNOSIS — I442 Atrioventricular block, complete: Secondary | ICD-10-CM | POA: Diagnosis not present

## 2021-02-21 LAB — CUP PACEART REMOTE DEVICE CHECK
Battery Remaining Longevity: 68 mo
Battery Remaining Percentage: 67 %
Battery Voltage: 3.01 V
Brady Statistic AP VP Percent: 15 %
Brady Statistic AP VS Percent: 1 %
Brady Statistic AS VP Percent: 85 %
Brady Statistic AS VS Percent: 1 %
Brady Statistic RA Percent Paced: 14 %
Brady Statistic RV Percent Paced: 99 %
Date Time Interrogation Session: 20220921020020
Implantable Lead Implant Date: 20190624
Implantable Lead Implant Date: 20190624
Implantable Lead Location: 753859
Implantable Lead Location: 753860
Implantable Lead Model: 5076
Implantable Lead Model: 5076
Implantable Pulse Generator Implant Date: 20190624
Lead Channel Impedance Value: 430 Ohm
Lead Channel Impedance Value: 480 Ohm
Lead Channel Pacing Threshold Amplitude: 0.75 V
Lead Channel Pacing Threshold Amplitude: 0.75 V
Lead Channel Pacing Threshold Pulse Width: 0.5 ms
Lead Channel Pacing Threshold Pulse Width: 0.5 ms
Lead Channel Sensing Intrinsic Amplitude: 3 mV
Lead Channel Sensing Intrinsic Amplitude: 7.4 mV
Lead Channel Setting Pacing Amplitude: 2 V
Lead Channel Setting Pacing Amplitude: 2.5 V
Lead Channel Setting Pacing Pulse Width: 0.5 ms
Lead Channel Setting Sensing Sensitivity: 2 mV
Pulse Gen Model: 2272
Pulse Gen Serial Number: 9033095

## 2021-02-28 DIAGNOSIS — F39 Unspecified mood [affective] disorder: Secondary | ICD-10-CM | POA: Diagnosis not present

## 2021-02-28 DIAGNOSIS — F419 Anxiety disorder, unspecified: Secondary | ICD-10-CM | POA: Diagnosis not present

## 2021-02-28 NOTE — Progress Notes (Signed)
Remote pacemaker transmission.   

## 2021-03-05 ENCOUNTER — Ambulatory Visit: Payer: Medicare HMO | Admitting: Internal Medicine

## 2021-03-05 ENCOUNTER — Encounter: Payer: Self-pay | Admitting: Internal Medicine

## 2021-03-05 ENCOUNTER — Other Ambulatory Visit: Payer: Self-pay

## 2021-03-05 VITALS — BP 140/88 | HR 78 | Ht 72.0 in | Wt 351.0 lb

## 2021-03-05 DIAGNOSIS — Z950 Presence of cardiac pacemaker: Secondary | ICD-10-CM | POA: Diagnosis not present

## 2021-03-05 DIAGNOSIS — Z79899 Other long term (current) drug therapy: Secondary | ICD-10-CM | POA: Diagnosis not present

## 2021-03-05 DIAGNOSIS — I442 Atrioventricular block, complete: Secondary | ICD-10-CM

## 2021-03-05 DIAGNOSIS — I509 Heart failure, unspecified: Secondary | ICD-10-CM | POA: Diagnosis not present

## 2021-03-05 DIAGNOSIS — I480 Paroxysmal atrial fibrillation: Secondary | ICD-10-CM

## 2021-03-05 MED ORDER — RIVAROXABAN 20 MG PO TABS: 20.0000 mg | ORAL_TABLET | Freq: Every day | ORAL | 11 refills | Status: DC

## 2021-03-05 MED ORDER — TORSEMIDE 20 MG PO TABS: 20.0000 mg | ORAL_TABLET | ORAL | 0 refills | Status: DC

## 2021-03-05 NOTE — Progress Notes (Addendum)
Patient Care Team: [name], MD as PCP - General (Family Medicine) Troy Sine, MD as PCP - Cardiology (Cardiology)   HPI  [name] is a 80 y.o. 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  Subsequently identified as having SCAF  No known coronary disease.  Significant obesity He has untreated sleep apnea because of intolerance to CPAP   Date Cr K Hgb  12/18 1.6 4.3   6/19  1.84 5.1   10/20 1.72 5.1 13.1  7/22 1.94 4.8 13.7   DATE TEST EF   2/19 Echo  50-55 %    The patient denies chest pain, nocturnal dyspnea, orthopnea   There have been no palpitations, lightheadedness or syncope.  Complains of dyspnea on exertion; panic attacks, drowsiness, coldness, tremulous, hot flashes.  In the mornings he generally feels fine. However, at some point after taking his medication he develops LE weakness. This occasionally limits his ability for formal exercise. For exercise, he walks when he is able, and sometimes uses a [employer] pedaling machine.       Records and Results Reviewed   Past Medical History:  Diagnosis Date   CKD (chronic kidney disease)    DM type 2 (diabetes mellitus, type 2) (HCC)    ED (erectile dysfunction)    Fatty liver    GERD (gastroesophageal reflux disease)    Gout    H/O adenomatous polyp of colon    HLD (hyperlipidemia)    HTN (hypertension)    Hypothyroid    Panic attacks    Psoriasis     Past Surgical History:  Procedure Laterality Date   PACEMAKER IMPLANT N/A 11/24/2017   Procedure: PACEMAKER IMPLANT;  Surgeon: Deboraha Sprang, MD;  Location: [facility];  Service: Cardiovascular;  Laterality: N/A;    Current Meds  Medication Sig   ACCU-CHEK SMARTVIEW test strip    apixaban (ELIQUIS) 5 MG TABS tablet Take 1 tablet (5 mg total) by mouth 2 (two) times daily.   atorvastatin (LIPITOR) 40 MG tablet Take 20  mg by mouth daily. Taking 1/2 tablet daily   Dextromethorphan-guaiFENesin (MUCINEX DM MAXIMUM STRENGTH PO) Take 20 mLs by mouth every 4 (four) hours as needed.   levothyroxine (SYNTHROID, LEVOTHROID) 175 MCG tablet Take 175 mcg by mouth daily.    LORazepam (ATIVAN) 1 MG tablet 1mg  by mouth 2-3 times a day   losartan (COZAAR) 50 MG tablet Take 1 tablet (50 mg total) by mouth daily.   PARoxetine (PAXIL) 10 MG tablet Take 10 mg by mouth daily.   PARoxetine (PAXIL) 40 MG tablet Take 40 mg by mouth daily.   ramelteon (ROZEREM) 8 MG tablet Take 8 mg by mouth at bedtime.   RESTASIS 0.05 % ophthalmic emulsion     Allergies  Allergen Reactions   Hctz [Hydrochlorothiazide]     Gout flare    Lexapro [Escitalopram Oxalate]     Ineffective for anxiety    Lisinopril     angioedema       Review of Systems negative except from HPI and PMH  Physical Exam    BP 140/88   Pulse 78   Ht 6' (1.829 m)   Wt (!) 351 lb (159.2 kg)   SpO2 97%   BMI 47.60 kg/m  Well developed and Morbidly obese in no acute distress HENT normal Neck supple with JVP-unable to discern  clear Device pocket well healed; without hematoma or erythema.  There is no tethering  Regular rate and rhythm, no  gallop No  murmur Abd-soft with active BS No Clubbing cyanosis 2+ edema Skin-warm and dry A & Oriented  Grossly normal sensory and motor function  ECG sinus P-synchronous/ AV  pacing   Assessment and  Plan  Right bundle branch block left anterior fascicular block   Complete heart block     Morbid obesity  Pacemaker St Jude  2019     Obstructive sleep apnea   Pulmonary hypertension/cor pulmonale/right heart failure  Orthostatic lightheadedness  Atrial fibrillation  SCAF   Renal insufficiency grade 3   Device function normal  It is his impression that his a.m. medicines are responsible for him feeling lousy.  And these include Eliquis.  We will undertake a replacement trial and I have given him  samples of Xarelto to take for 2 weeks to help ascertain whether the Eliquis is the culprit.  Given the cost of Eliquis, we have also given him the name of [facility]  He is volume overloaded; given the degree of right heart failure, and the likely GI edema, we will switch him to torsemide and decrease the dose somewhat to 20 mg a day  Blood pressure reasonably controlled.  We will continue losartan 50.  Encouraged him to work on his panic disorder and discussed the [place]/[facility] pharmacy options   I,Mathew Stumpf,acting as a scribe for Virl Axe, MD.,have documented all relevant documentation on the behalf of Virl Axe, MD,as directed by  Virl Axe, MD while in the presence of Virl Axe, MD.  I, Virl Axe, MD, have reviewed all documentation for this visit. The documentation on 03/05/21 for the exam, diagnosis, procedures, and orders are all accurate and complete.

## 2021-03-05 NOTE — Patient Instructions (Signed)
Medication Instructions:  Your physician has recommended you make the following change in your medication:   ** Stop Eliquis ** Stop Furosemide ** Hold your Lipitor  ** Begin Xarelto 20mg  - 1 tablet by mouth daily as directed with supper. ** Begin Torsemide 20mg  - 1 tablet by mouth every other day.  *If you need a refill on your cardiac medications before your next appointment, please call your pharmacy*   Lab Work: BMET in 2 weeks  If you have labs (blood work) drawn today and your tests are completely normal, you will receive your results only by: [place] (if you have MyChart) OR A paper copy in the mail If you have any lab test that is abnormal or we need to change your treatment, we will call you to review the results.   Testing/Procedures: Your physician has requested that you have an echocardiogram. Echocardiography is a painless test that uses sound waves to create images of your heart. It provides your doctor with information about the size and shape of your heart and how well your heart's chambers and valves are working. This procedure takes approximately one hour. There are no restrictions for this procedure.    Follow-Up: At [facility], you and your health needs are our priority.  As part of our continuing mission to provide you with exceptional heart care, we have created designated Provider Care Teams.  These Care Teams include your primary Cardiologist (physician) and Advanced Practice Providers (APPs -  Physician Assistants and Nurse Practitioners) who all work together to provide you with the care you need, when you need it.  We recommend signing up for the patient portal called "MyChart".  Sign up information is provided on this After Visit Summary.  MyChart is used to connect with patients for Virtual Visits (Telemedicine).  Patients are able to view lab/test results, encounter notes, upcoming appointments, etc.  Non-urgent messages can be sent to your  provider as well.   To learn more about what you can do with MyChart, go to [email or web address].    Your next appointment:   Follow up with Dr Caryl Comes in 1 year

## 2021-03-14 DIAGNOSIS — F419 Anxiety disorder, unspecified: Secondary | ICD-10-CM | POA: Diagnosis not present

## 2021-03-14 DIAGNOSIS — F39 Unspecified mood [affective] disorder: Secondary | ICD-10-CM | POA: Diagnosis not present

## 2021-03-16 ENCOUNTER — Telehealth: Payer: Self-pay | Admitting: Cardiovascular Disease

## 2021-03-16 NOTE — Telephone Encounter (Signed)
Spoke with [name] regarding medication changes made per Dr. Caryl Comes at office visit on 03/05/21. [name] states that he was seen by Dr. Caryl Comes however it was not explained to him why all of the medication changes were happening. Per office note explained to [name] the reason for medication changes. [name] does not want to continue with Xarelto, he would prefer to go back to Eliquis as previously prescribed. [name] to continue with toresemide as prescribed and have blood work done in the next couple of days. [name] is also on a statin holiday- there is nothing in Dr. Olin Pia note that states how long [name] would be on statin holiday, however [name] states that since stopping atorvastatin he has feels better. Will forward to provider and pharmacy team. [name] verbalizes understanding.

## 2021-03-16 NOTE — Telephone Encounter (Signed)
Patient called in to see why there was a change in his medication. Please advise

## 2021-03-19 ENCOUNTER — Other Ambulatory Visit: Payer: Self-pay

## 2021-03-19 ENCOUNTER — Other Ambulatory Visit: Payer: Medicare HMO

## 2021-03-19 DIAGNOSIS — I480 Paroxysmal atrial fibrillation: Secondary | ICD-10-CM | POA: Diagnosis not present

## 2021-03-19 DIAGNOSIS — Z79899 Other long term (current) drug therapy: Secondary | ICD-10-CM

## 2021-03-19 MED ORDER — ROSUVASTATIN CALCIUM 10 MG PO TABS
10.0000 mg | ORAL_TABLET | Freq: Every day | ORAL | 3 refills | Status: AC
Start: 2021-03-19 — End: ?

## 2021-03-19 MED ORDER — APIXABAN 5 MG PO TABS: 5.0000 mg | ORAL_TABLET | Freq: Two times a day (BID) | ORAL | 3 refills | Status: DC

## 2021-03-19 NOTE — Telephone Encounter (Signed)
That's fine for him to resume his Eliquis if he wishes. He was changed to Xarelto because he reported feeling poorly after taking AM meds. He should resume Eliquis 5mg  BID with first dose 24 hours after last dose of Xarelto.  I don't see mention of the statin holiday in most recent note. If he's been feeling better off of the atorvastatin, would be reasonable to rechallenge with rosuvastatin 10mg  daily to see if he tolerates that better.

## 2021-03-19 NOTE — Telephone Encounter (Signed)
Spoke with pt and pt's wife and advised per pharmacy team ok for pt to resume Eliquis 5mg  - 1 tablet by mouth twice daily.  Pt states he would like to stop Xarelto and restart Eliquis 5mg . Pt had last dose of Xarelto last night and will restart Eliquis with evening dose tonight.  Pt states he has new shipment of Eliquis and does not need refill.  Pt does not wish to restart Atorvastatin and would like to try Rosuvastatin 10mg  - 1 tablet by mouth daily.  Pt states he has had an increase in urination since changing to Torsemide.  Pt completed labs as instructed today.   Pt verbalizes understanding of all instructions and agrees with current plan.

## 2021-03-20 LAB — BASIC METABOLIC PANEL
BUN/Creatinine Ratio: 12 (ref 10–24)
BUN: 24 mg/dL (ref 8–27)
CO2: 23 mmol/L (ref 20–29)
Calcium: 9.6 mg/dL (ref 8.6–10.2)
Chloride: 100 mmol/L (ref 96–106)
Creatinine, Ser: 1.96 mg/dL — ABNORMAL HIGH (ref 0.76–1.27)
Glucose: 113 mg/dL — ABNORMAL HIGH (ref 70–99)
Potassium: 4.2 mmol/L (ref 3.5–5.2)
Sodium: 141 mmol/L (ref 134–144)
eGFR: 34 mL/min/{1.73_m2} — ABNORMAL LOW (ref >59)

## 2021-03-23 ENCOUNTER — Ambulatory Visit (HOSPITAL_COMMUNITY): Payer: Medicare HMO | Attending: Cardiology

## 2021-03-23 ENCOUNTER — Other Ambulatory Visit: Payer: Self-pay

## 2021-03-23 DIAGNOSIS — I509 Heart failure, unspecified: Secondary | ICD-10-CM | POA: Insufficient documentation

## 2021-03-23 LAB — ECHOCARDIOGRAM COMPLETE
AR max vel: 2.33 cm2
AV Area VTI: 2.3 cm2
AV Area mean vel: 2.23 cm2
AV Mean grad: 10.8 mmHg
AV Peak grad: 19.5 mmHg
Ao pk vel: 2.21 m/s
Area-P 1/2: 3.51 cm2
S' Lateral: 3.5 cm

## 2021-04-02 ENCOUNTER — Telehealth: Payer: Self-pay | Admitting: Internal Medicine

## 2021-04-02 NOTE — Telephone Encounter (Signed)
Eliquis needs to be taken twice daily to be effective in stroke prevention. It does not cause shaking as a side effect. He attributed feeling poorly to his meds when he saw Dr Caryl Comes on 10/3 who changed him to Xarelto to see if his sx would improve. Pt then called in stating he didn't want to change to Xarelto, and wanted to continue on Eliquis which he was cleared to do. He was also changed from atorvastatin to rosuvastatin to see if he tolerated that better.  If he chooses to continue on Eliquis, he needs to take it twice daily. If he wants to try changing to Xarelto, that's fine too but it's unlikely his symptoms are coming from the Eliquis.

## 2021-04-02 NOTE — Telephone Encounter (Signed)
Pt c/o medication issue:  1. Name of Medication:  apixaban (ELIQUIS) 5 MG TABS tablet  2. How are you currently taking this medication (dosage and times per day)?  1 tablet (5 mg total) by mouth 2 (two) times daily  3. Are you having a reaction (difficulty breathing--STAT)?  See below 4. What is your medication issue?  Patient's wife would like to know if the patient can stop taking this medication at night. She states it is causing him to shake really bad in the morning.

## 2021-04-03 NOTE — Telephone Encounter (Signed)
Spoke with pt's wife, Hoyle Sauer, [place] and advised of Megan's comments as below.  Pt will continue with Eliquis for now.  Recommended pt follow up with PCP re: morning shaking.  Pt's wife verbalizes understanding and agrees with current plan.

## 2021-04-04 DIAGNOSIS — F419 Anxiety disorder, unspecified: Secondary | ICD-10-CM | POA: Diagnosis not present

## 2021-04-04 DIAGNOSIS — F39 Unspecified mood [affective] disorder: Secondary | ICD-10-CM | POA: Diagnosis not present

## 2021-04-10 ENCOUNTER — Emergency Department (HOSPITAL_COMMUNITY): Payer: Medicare HMO

## 2021-04-10 ENCOUNTER — Other Ambulatory Visit: Payer: Self-pay

## 2021-04-10 ENCOUNTER — Encounter (HOSPITAL_COMMUNITY): Payer: Self-pay | Admitting: Emergency Medicine

## 2021-04-10 ENCOUNTER — Emergency Department (HOSPITAL_COMMUNITY)
Admission: EM | Admit: 2021-04-10 | Discharge: 2021-04-10 | Disposition: A | Discharge status: Home / Self Care | Payer: Medicare HMO | Attending: Emergency Medicine | Admitting: Emergency Medicine

## 2021-04-10 DIAGNOSIS — Z79899 Other long term (current) drug therapy: Secondary | ICD-10-CM | POA: Insufficient documentation

## 2021-04-10 DIAGNOSIS — E1122 Type 2 diabetes mellitus with diabetic chronic kidney disease: Secondary | ICD-10-CM | POA: Diagnosis not present

## 2021-04-10 DIAGNOSIS — Y900 Blood alcohol level of less than 20 mg/100 ml: Secondary | ICD-10-CM | POA: Insufficient documentation

## 2021-04-10 DIAGNOSIS — R457 State of emotional shock and stress, unspecified: Secondary | ICD-10-CM | POA: Diagnosis not present

## 2021-04-10 DIAGNOSIS — Z20822 Contact with and (suspected) exposure to covid-19: Secondary | ICD-10-CM | POA: Diagnosis not present

## 2021-04-10 DIAGNOSIS — E039 Hypothyroidism, unspecified: Secondary | ICD-10-CM | POA: Insufficient documentation

## 2021-04-10 DIAGNOSIS — Z7901 Long term (current) use of anticoagulants: Secondary | ICD-10-CM | POA: Diagnosis not present

## 2021-04-10 DIAGNOSIS — I129 Hypertensive chronic kidney disease with stage 1 through stage 4 chronic kidney disease, or unspecified chronic kidney disease: Secondary | ICD-10-CM | POA: Diagnosis not present

## 2021-04-10 DIAGNOSIS — R0602 Shortness of breath: Secondary | ICD-10-CM | POA: Diagnosis not present

## 2021-04-10 DIAGNOSIS — N189 Chronic kidney disease, unspecified: Secondary | ICD-10-CM | POA: Diagnosis not present

## 2021-04-10 DIAGNOSIS — R609 Edema, unspecified: Secondary | ICD-10-CM | POA: Insufficient documentation

## 2021-04-10 DIAGNOSIS — Z950 Presence of cardiac pacemaker: Secondary | ICD-10-CM | POA: Insufficient documentation

## 2021-04-10 DIAGNOSIS — F419 Anxiety disorder, unspecified: Secondary | ICD-10-CM | POA: Diagnosis not present

## 2021-04-10 DIAGNOSIS — I10 Essential (primary) hypertension: Secondary | ICD-10-CM | POA: Diagnosis not present

## 2021-04-10 LAB — CBC WITH DIFFERENTIAL/PLATELET
Abs Immature Granulocytes: 0.03 10*3/uL (ref 0.00–0.07)
Basophils Absolute: 0 10*3/uL (ref 0.0–0.1)
Basophils Relative: 1 %
Eosinophils Absolute: 0.1 10*3/uL (ref 0.0–0.5)
Eosinophils Relative: 2 %
HCT: 44.4 % (ref 39.0–52.0)
Hemoglobin: 14 g/dL (ref 13.0–17.0)
Immature Granulocytes: 1 %
Lymphocytes Relative: 10 %
Lymphs Abs: 0.7 10*3/uL (ref 0.7–4.0)
MCH: 29.4 pg (ref 26.0–34.0)
MCHC: 31.5 g/dL (ref 30.0–36.0)
MCV: 93.3 fL (ref 80.0–100.0)
Monocytes Absolute: 0.6 10*3/uL (ref 0.1–1.0)
Monocytes Relative: 9 %
Neutro Abs: 5.1 10*3/uL (ref 1.7–7.7)
Neutrophils Relative %: 77 %
Platelets: 165 10*3/uL (ref 150–400)
RBC: 4.76 MIL/uL (ref 4.22–5.81)
RDW: 14.5 % (ref 11.5–15.5)
WBC: 6.5 10*3/uL (ref 4.0–10.5)
nRBC: 0 % (ref 0.0–0.2)

## 2021-04-10 LAB — COMPREHENSIVE METABOLIC PANEL
ALT: 17 U/L (ref 0–44)
AST: 22 U/L (ref 15–41)
Albumin: 3.9 g/dL (ref 3.5–5.0)
Alkaline Phosphatase: 67 U/L (ref 38–126)
Anion gap: 9 (ref 5–15)
BUN: 25 mg/dL — ABNORMAL HIGH (ref 8–23)
CO2: 26 mmol/L (ref 22–32)
Calcium: 9.5 mg/dL (ref 8.9–10.3)
Chloride: 103 mmol/L (ref 98–111)
Creatinine, Ser: 1.94 mg/dL — ABNORMAL HIGH (ref 0.61–1.24)
GFR, Estimated: 35 mL/min — ABNORMAL LOW (ref >60)
Glucose, Bld: 101 mg/dL — ABNORMAL HIGH (ref 70–99)
Potassium: 4.6 mmol/L (ref 3.5–5.1)
Sodium: 138 mmol/L (ref 135–145)
Total Bilirubin: 1.7 mg/dL — ABNORMAL HIGH (ref 0.3–1.2)
Total Protein: 6.5 g/dL (ref 6.5–8.1)

## 2021-04-10 LAB — RESP PANEL BY RT-PCR (FLU A&B, COVID) ARPGX2
Influenza A by PCR: NEGATIVE
Influenza B by PCR: NEGATIVE
SARS Coronavirus 2 by RT PCR: NEGATIVE

## 2021-04-10 LAB — TSH: TSH: 0.032 u[IU]/mL — ABNORMAL LOW (ref 0.350–4.500)

## 2021-04-10 LAB — ETHANOL: Alcohol, Ethyl (B): <10 mg/dL (ref <10)

## 2021-04-10 MED ORDER — ROSUVASTATIN CALCIUM 5 MG PO TABS
10.0000 mg | ORAL_TABLET | Freq: Every day | ORAL | Status: DC
  Filled 2021-04-10: qty 2

## 2021-04-10 MED ORDER — MELATONIN 3 MG PO TABS: 3.0000 mg | ORAL_TABLET | Freq: Every evening | ORAL | Status: DC | PRN

## 2021-04-10 MED ORDER — TIAGABINE HCL 4 MG PO TABS
4.0000 mg | ORAL_TABLET | Freq: Two times a day (BID) | ORAL | Status: DC
  Filled 2021-04-10 (×2): qty 1

## 2021-04-10 MED ORDER — MIRTAZAPINE 15 MG PO TABS: 15.0000 mg | ORAL_TABLET | Freq: Every day | ORAL | Status: DC

## 2021-04-10 MED ORDER — LORAZEPAM 2 MG/ML IJ SOLN
1.0000 mg | Freq: Once | INTRAMUSCULAR | Status: AC
  Administered 2021-04-10: 1 mg via INTRAVENOUS
  Filled 2021-04-10: qty 1

## 2021-04-10 MED ORDER — FAMOTIDINE 20 MG PO TABS: 20.0000 mg | ORAL_TABLET | Freq: Every evening | ORAL | Status: DC | PRN

## 2021-04-10 MED ORDER — LORAZEPAM 2 MG/ML IJ SOLN: 1.0000 mg | Freq: Once | INTRAMUSCULAR | Status: DC

## 2021-04-10 MED ORDER — LEVOTHYROXINE SODIUM 75 MCG PO TABS: 175.0000 ug | ORAL_TABLET | Freq: Every day | ORAL | Status: DC

## 2021-04-10 MED ORDER — TORSEMIDE 20 MG PO TABS
20.0000 mg | ORAL_TABLET | ORAL | Status: DC
  Filled 2021-04-10: qty 1

## 2021-04-10 MED ORDER — APIXABAN 5 MG PO TABS: 5.0000 mg | ORAL_TABLET | Freq: Two times a day (BID) | ORAL | Status: DC

## 2021-04-10 MED ORDER — PAROXETINE HCL 20 MG PO TABS: 40.0000 mg | ORAL_TABLET | Freq: Every day | ORAL | Status: DC

## 2021-04-10 MED ORDER — CYCLOSPORINE 0.05 % OP EMUL: 1.0000 [drp] | Freq: Two times a day (BID) | OPHTHALMIC | Status: DC | PRN

## 2021-04-10 MED ORDER — LORAZEPAM 1 MG PO TABS: 1.0000 mg | ORAL_TABLET | ORAL | Status: DC | PRN

## 2021-04-10 MED ORDER — RAMELTEON 8 MG PO TABS
8.0000 mg | ORAL_TABLET | Freq: Every day | ORAL | Status: DC
  Filled 2021-04-10: qty 1

## 2021-04-10 MED ORDER — LOSARTAN POTASSIUM 50 MG PO TABS
50.0000 mg | ORAL_TABLET | Freq: Every day | ORAL | Status: DC
  Filled 2021-04-10: qty 1

## 2021-04-10 NOTE — Discharge Instructions (Signed)
Please discuss your issue with your GI doctor and your cardiologist.  Please return for worsening symptoms or if you would like to be reevaluated.

## 2021-04-10 NOTE — ED Provider Notes (Signed)
[facility] EMERGENCY DEPARTMENT Provider Note   CSN: [id number] Arrival date & time: 04/10/21  1306     History Chief Complaint  Patient presents with   Anxiety    [name] is a 80 y.o. male.  Pt presents to the ED today with anxiety.  Pt has a hx of anxiety.  He said he wakes up in the morning and feels very shaky.  He feels like he just needs to calm down.  He has seen his cardiologist to see if his morning meds are making him feel bad. Pt has seen his psychiatrist who recently started him on tigabine to help with the shakes.  Pt called his psychiatrist today who told him to come to the ED for eval.      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  Social History   Tobacco Use   Smoking status: Never   Smokeless tobacco: Never  Substance Use Topics   Alcohol use: No    Alcohol/week: 0.0 standard drinks   Drug use: No    Home Medications Prior to Admission medications   Medication Sig Start Date End Date Taking? Authorizing Provider  apixaban (ELIQUIS) 5 MG TABS tablet Take 1 tablet (5 mg total) by mouth 2 (two) times  daily. 03/19/21  Yes Supple, Megan E, RPH-CPP  Dextromethorphan-guaiFENesin (MUCINEX DM MAXIMUM STRENGTH PO) Take 20 mLs by mouth every 4 (four) hours as needed.   Yes [provider]  famotidine (PEPCID) 20 MG tablet Take 20 mg by mouth at bedtime as needed.   Yes [provider]  levothyroxine (SYNTHROID, LEVOTHROID) 175 MCG tablet Take 175 mcg by mouth daily.  05/12/17  Yes [provider]  LORazepam (ATIVAN) 1 MG tablet 1mg  by mouth 2-3 times a day   Yes [provider]  losartan (COZAAR) 50 MG tablet Take 1 tablet (50 mg total) by mouth daily. 03/03/18  Yes Deboraha Sprang, MD  Melatonin 3 MG CAPS Take 3 mg by mouth at bedtime as needed (sleep).   Yes [provider]  mirtazapine (REMERON) 15 MG tablet Take 15 mg by mouth at bedtime.   Yes [provider]  PARoxetine (PAXIL) 40 MG tablet Take 40 mg by mouth daily.   Yes [provider]  ramelteon (ROZEREM) 8 MG tablet Take 8 mg by mouth at bedtime.   Yes [provider]  RESTASIS 0.05 % ophthalmic emulsion Place 1 drop into both eyes 2 (two) times daily as needed (dry eyes). 06/09/19  Yes [provider]  rosuvastatin (CRESTOR) 10 MG tablet Take 1 tablet (10 mg total) by mouth daily. 03/19/21  Yes Supple, Megan E, RPH-CPP  tiaGABine (GABITRIL) 4 MG tablet Take 4 mg by mouth See admin instructions. Take 2 mg by mouth twice daily, then in 7 days go to 1 tablet by mouth twice daily   Yes [provider]  torsemide (DEMADEX) 20 MG tablet Take 1 tablet (20 mg total) by mouth every other day. 03/05/21  Yes Deboraha Sprang, MD  ACCU-CHEK SMARTVIEW test strip  12/23/19   [provider]    Allergies    Hctz [hydrochlorothiazide], Lexapro [escitalopram oxalate], and Lisinopril  Review of Systems   Review of Systems  Psychiatric/Behavioral:  The patient is nervous/anxious.   All other systems reviewed and are negative.  Physical Exam Updated Vital  Signs BP 113/81 (BP Location: Left Arm)   Pulse 71   Temp 97.6 F (36.4 C) (Oral)   Resp 18   SpO2 98%   Physical Exam Vitals and nursing note reviewed.  Constitutional:      Appearance: Normal appearance. He is obese.  HENT:     Head: Normocephalic and atraumatic.     Right Ear: External ear normal.     Left Ear: External ear normal.     Nose: Nose normal.     Mouth/Throat:     Mouth: Mucous membranes are moist.     Pharynx: Oropharynx is clear.  Eyes:     Extraocular Movements: Extraocular movements intact.     Conjunctiva/sclera: Conjunctivae normal.     Pupils: Pupils are equal, round, and reactive to light.  Cardiovascular:     Rate and Rhythm: Normal rate and regular rhythm.     Pulses: Normal pulses.     Heart sounds: Normal heart sounds.  Pulmonary:     Effort: Pulmonary effort is normal.     Breath sounds: Normal breath sounds.  Abdominal:     General: Abdomen is flat. Bowel sounds are normal.     Palpations: Abdomen is soft.  Musculoskeletal:     Cervical back: Normal range of motion and neck supple.     Right lower leg: Edema present.     Left lower leg: Edema present.  Skin:    General: Skin is warm.     Capillary Refill: Capillary refill takes less than 2 seconds.  Neurological:     General: No focal deficit present.     Mental Status: He is alert and oriented to person, place, and time.  Psychiatric:        Mood and Affect: Mood normal.        Behavior: Behavior normal.    ED Results / Procedures / Treatments   Labs (all labs ordered are listed, but only abnormal results are displayed) Labs Reviewed  RESP PANEL BY RT-PCR (FLU A&B, COVID) ARPGX2  CBC WITH DIFFERENTIAL/PLATELET  COMPREHENSIVE METABOLIC PANEL  ETHANOL  RAPID URINE DRUG SCREEN, HOSP PERFORMED  URINALYSIS, ROUTINE W REFLEX MICROSCOPIC  TSH    EKG EKG Interpretation  Date/Time:  Tuesday April 10 2021 14:43:15 EST Ventricular Rate:  75 PR Interval:    QRS Duration: 162 QT  Interval:  450 QTC Calculation: 502 R Axis:   -77 Text Interpretation: Ventricular-paced rhythm Abnormal ECG No significant change since last tracing Confirmed by Isla Pence [phone]) on 04/10/2021 2:46:00 PM  Radiology DG Chest 2 View  Result Date: 04/10/2021 CLINICAL DATA:  A 80 year old male presents with history  of shortness of breath. EXAM: CHEST - 2 VIEW COMPARISON:  Chest x-ray from January of 2022. FINDINGS: Trachea is midline. LEFT-sided dual lead pacer device remains in place, power pack over the LEFT chest. Study limited by portable technique and patient body habitus. Stable appearance of cardiac silhouette. No gross evidence of effusion. No sign of consolidation. No visible pneumothorax. On limited assessment there is no acute skeletal process. IMPRESSION: No acute cardiopulmonary disease. Electronically Signed   By: Zetta Bills M.D.   On: 04/10/2021 14:40    Procedures Procedures   Medications Ordered in ED Medications  apixaban (ELIQUIS) tablet 5 mg (has no administration in time range)  famotidine (PEPCID) tablet 20 mg (has no administration in time range)  levothyroxine (SYNTHROID) tablet 175 mcg (has no administration in time range)  LORazepam (ATIVAN) tablet 1 mg (has no administration in time range)  losartan (COZAAR) tablet 50 mg (has no administration in time range)  Melatonin CAPS 3 mg (has no administration in time range)  mirtazapine (REMERON) tablet 15 mg (has no administration in time range)  PARoxetine (PAXIL) tablet 40 mg (has no administration in time range)  ramelteon (ROZEREM) tablet 8 mg (has no administration in time range)  cycloSPORINE (RESTASIS) 0.05 % ophthalmic emulsion 1 drop (has no administration in time range)  rosuvastatin (CRESTOR) tablet 10 mg (has no administration in time range)  tiaGABine (GABITRIL) tablet 4 mg (has no administration in time range)  torsemide (DEMADEX) tablet 20 mg (has no administration in time range)  LORazepam  (ATIVAN) injection 1 mg (1 mg Intravenous Given 04/10/21 1432)    ED Course  I have reviewed the triage vital signs and the nursing notes.  Pertinent labs & imaging results that were available during my care of the patient were reviewed by me and considered in my medical decision making (see chart for details).    MDM Rules/Calculators/A&P                           TTS consult placed.  Pt's home meds and diet ordered.  Pt is medically clear.    Final Clinical Impression(s) / ED Diagnoses Final diagnoses:  Anxiety    Rx / DC Orders ED Discharge Orders     None        Isla Pence, MD 04/10/21 1523

## 2021-04-10 NOTE — ED Triage Notes (Signed)
Pt BIB [facility] from home, complaint of anxiety, states meds are not working anymore, psychiatrist wanted him to come be evaluated. VSS. NAD.

## 2021-04-10 NOTE — ED Notes (Signed)
2 family members remaining at bedside- wife & daughter. This RN went to give pt daily medication, was advised that no one had spoken to pt/family regarding POC. This RN instructed pt & family that since pt had been medically cleared, [facility] protocol dictated that family members leave, pt be changed into hospital attire, psych consult performed. Pt's family expressed concern r/t PRN medication for anxiety, stating it has been ineffective & would like to discuss other options prior to leaving pt alone. EDP Tyrone Nine aware, to review chart & speak to pt/family.

## 2021-04-10 NOTE — ED Provider Notes (Signed)
80 yo M with a chief complaints of increased anxiety.  The patient was reportedly sent by his psychiatrist with an inability to control his anxiety to be seen here and potentially admitted into psychiatric care.  Plan for lab work and then TTS evaluation.  Lab work is resulted and is consistent with his baseline.  Baseline renal dysfunction no significant anemia.  No significant electrolyte abnormality.  TSH is mildly low.  I discussed with the patient what the plan was they were a bit confused about coming in for psychiatric hospitalization.  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  Unfortunately he is not cleared by his cardiologist and has had ongoing symptoms.  Since then they have tried multiple different medications to try and improve his anxiety with eating and drinking.  I suggested the patient follow back up with his GI doctor and cardiologist and explain how severe this impacts his life.  We will have him follow-up with his psychiatrist for ongoing symptom management.   Deno Etienne, DO 04/10/21 1651

## 2021-04-15 ENCOUNTER — Encounter (HOSPITAL_COMMUNITY): Payer: Self-pay | Admitting: Emergency Medicine

## 2021-04-15 ENCOUNTER — Emergency Department (HOSPITAL_COMMUNITY)
Admission: EM | Admit: 2021-04-15 | Discharge: 2021-04-15 | Disposition: A | Discharge status: Home / Self Care | Payer: Medicare HMO | Attending: Emergency Medicine | Admitting: Emergency Medicine

## 2021-04-15 ENCOUNTER — Other Ambulatory Visit: Payer: Self-pay

## 2021-04-15 ENCOUNTER — Emergency Department (HOSPITAL_COMMUNITY): Payer: Medicare HMO

## 2021-04-15 DIAGNOSIS — Z79899 Other long term (current) drug therapy: Secondary | ICD-10-CM | POA: Diagnosis not present

## 2021-04-15 DIAGNOSIS — N189 Chronic kidney disease, unspecified: Secondary | ICD-10-CM | POA: Insufficient documentation

## 2021-04-15 DIAGNOSIS — E039 Hypothyroidism, unspecified: Secondary | ICD-10-CM | POA: Insufficient documentation

## 2021-04-15 DIAGNOSIS — F419 Anxiety disorder, unspecified: Secondary | ICD-10-CM | POA: Diagnosis not present

## 2021-04-15 DIAGNOSIS — I480 Paroxysmal atrial fibrillation: Secondary | ICD-10-CM | POA: Insufficient documentation

## 2021-04-15 DIAGNOSIS — E1122 Type 2 diabetes mellitus with diabetic chronic kidney disease: Secondary | ICD-10-CM | POA: Diagnosis not present

## 2021-04-15 DIAGNOSIS — Z950 Presence of cardiac pacemaker: Secondary | ICD-10-CM | POA: Insufficient documentation

## 2021-04-15 DIAGNOSIS — R0602 Shortness of breath: Secondary | ICD-10-CM | POA: Diagnosis not present

## 2021-04-15 DIAGNOSIS — Z7901 Long term (current) use of anticoagulants: Secondary | ICD-10-CM | POA: Insufficient documentation

## 2021-04-15 DIAGNOSIS — I129 Hypertensive chronic kidney disease with stage 1 through stage 4 chronic kidney disease, or unspecified chronic kidney disease: Secondary | ICD-10-CM | POA: Diagnosis not present

## 2021-04-15 DIAGNOSIS — I517 Cardiomegaly: Secondary | ICD-10-CM | POA: Diagnosis not present

## 2021-04-15 LAB — CBC WITH DIFFERENTIAL/PLATELET
Abs Immature Granulocytes: 0.03 10*3/uL (ref 0.00–0.07)
Basophils Absolute: 0 10*3/uL (ref 0.0–0.1)
Basophils Relative: 1 %
Eosinophils Absolute: 0.1 10*3/uL (ref 0.0–0.5)
Eosinophils Relative: 2 %
HCT: 45.1 % (ref 39.0–52.0)
Hemoglobin: 14.1 g/dL (ref 13.0–17.0)
Immature Granulocytes: 1 %
Lymphocytes Relative: 9 %
Lymphs Abs: 0.6 10*3/uL — ABNORMAL LOW (ref 0.7–4.0)
MCH: 29.9 pg (ref 26.0–34.0)
MCHC: 31.3 g/dL (ref 30.0–36.0)
MCV: 95.6 fL (ref 80.0–100.0)
Monocytes Absolute: 0.5 10*3/uL (ref 0.1–1.0)
Monocytes Relative: 8 %
Neutro Abs: 5 10*3/uL (ref 1.7–7.7)
Neutrophils Relative %: 79 %
Platelets: 178 10*3/uL (ref 150–400)
RBC: 4.72 MIL/uL (ref 4.22–5.81)
RDW: 14.3 % (ref 11.5–15.5)
WBC: 6.3 10*3/uL (ref 4.0–10.5)
nRBC: 0 % (ref 0.0–0.2)

## 2021-04-15 LAB — BASIC METABOLIC PANEL
Anion gap: 11 (ref 5–15)
BUN: 27 mg/dL — ABNORMAL HIGH (ref 8–23)
CO2: 27 mmol/L (ref 22–32)
Calcium: 9.4 mg/dL (ref 8.9–10.3)
Chloride: 101 mmol/L (ref 98–111)
Creatinine, Ser: 2.02 mg/dL — ABNORMAL HIGH (ref 0.61–1.24)
GFR, Estimated: 33 mL/min — ABNORMAL LOW (ref >60)
Glucose, Bld: 100 mg/dL — ABNORMAL HIGH (ref 70–99)
Potassium: 4.5 mmol/L (ref 3.5–5.1)
Sodium: 139 mmol/L (ref 135–145)

## 2021-04-15 LAB — TROPONIN I (HIGH SENSITIVITY): Troponin I (High Sensitivity): 23 ng/L — ABNORMAL HIGH (ref <18)

## 2021-04-15 LAB — D-DIMER, QUANTITATIVE: D-Dimer, Quant: 0.35 ug/mL-FEU (ref 0.00–0.50)

## 2021-04-15 MED ORDER — ALBUTEROL SULFATE HFA 108 (90 BASE) MCG/ACT IN AERS
2.0000 | INHALATION_SPRAY | RESPIRATORY_TRACT | Status: DC | PRN
  Administered 2021-04-15: 2 via RESPIRATORY_TRACT
  Filled 2021-04-15: qty 6.7

## 2021-04-15 MED ORDER — LORAZEPAM 1 MG PO TABS
1.0000 mg | ORAL_TABLET | Freq: Once | ORAL | Status: AC
  Administered 2021-04-15: 1 mg via ORAL
  Filled 2021-04-15: qty 1

## 2021-04-15 NOTE — ED Triage Notes (Signed)
C/o SOB x 3-5 days with R ankle swelling.  Denies chest pain.

## 2021-04-15 NOTE — ED Provider Notes (Addendum)
Emergency Medicine Provider Triage Evaluation Note  [name] , a 80 y.o. male  was evaluated in triage.  Pt complains of shortness of breath for the past few days.  Comes and goes.  History of atrial fibrillation, has a pacemaker.  Was here on the eighth with similar symptoms and a negative work-up.  Review of Systems  Positive: Shortness of breath/anxiety Negative: Chest pain  Physical Exam  BP 98/62 (BP Location: Right Arm)   Pulse 64   Temp 97.6 F (36.4 C) (Oral)   Resp 20   SpO2 98%  Gen:   Awake, no distress   Resp:  Normal effort  MSK:   Moves extremities without difficulty  Other:  Clear to auscultation bilaterally.  No decreased breath sounds. afib  Medical Decision Making  Medically screening exam initiated at 12:44 PM.  Appropriate orders placed.  Mattis Featherly was informed that the remainder of the evaluation will be completed by another provider, this initial triage assessment does not replace that evaluation, and the importance of remaining in the ED until their evaluation is complete.     Rhae Hammock, PA-C 04/15/21 1311  Vital signs normal, [employer], PA-C 04/15/21 1313    Godfrey Pick, MD 04/16/21 0002

## 2021-04-15 NOTE — ED Provider Notes (Signed)
[facility] EMERGENCY DEPARTMENT Provider Note   CSN: [id number] Arrival date & time: 04/15/21  1213     History Chief Complaint  Patient presents with   Shortness of Breath    [name] is a 80 y.o. male.  This is a 80 y.o. male with significant medical history as below, including CKD, dm, panic attacks who presents to the ED with complaint of dib, panic attack  Pt with similar presentation 11/8, reports he felt better until he got back home and within 1-2 hours he had reoccurrence of the anxiety/dib. Feels he has been having intermittent panic attacks the last few days. Symptoms worsened by being in the emergency dept. He has no chest pain, no fevers or chills, no n/v, change to bowel/bladder function, dyspnea is not exertional. No home O2 use. No recent travel or sick contacts. No hx dvt/pe. He has no hallucinations or delusions, no SI or HI. He does not appear acutely psychotic. No trauma.  The history is provided by the patient and a relative. No language interpreter was used.  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  Social History   Tobacco Use   Smoking status: Never   Smokeless tobacco: Never  Substance Use Topics   Alcohol use: No    Alcohol/week: 0.0 standard drinks   Drug use: No    Home Medications Prior to Admission medications   Medication Sig Start Date End Date Taking? Authorizing Provider  ACCU-CHEK SMARTVIEW test strip  12/23/19   [provider]  apixaban (ELIQUIS) 5 MG TABS tablet Take 1 tablet (5 mg total) by mouth 2 (two) times daily. 03/19/21   Supple, Megan E, RPH-CPP  Dextromethorphan-guaiFENesin (MUCINEX DM MAXIMUM STRENGTH PO) Take 20 mLs by mouth every 4 (four) hours as needed.    [provider]  famotidine (PEPCID) 20 MG tablet Take 20 mg by mouth at bedtime as needed.    [provider]  levothyroxine (SYNTHROID, LEVOTHROID) 175 MCG tablet Take 175 mcg by mouth daily.  05/12/17   [provider]  LORazepam (ATIVAN) 1 MG tablet 1mg  by mouth 2-3 times a day    [provider]  losartan (COZAAR) 50 MG tablet Take 1 tablet (50 mg total) by mouth daily. 03/03/18   Deboraha Sprang, MD  Melatonin 3 MG CAPS Take 3 mg  by mouth at bedtime as needed (sleep).    [provider]  mirtazapine (REMERON) 15 MG tablet Take 15 mg by mouth at bedtime.    [provider]  PARoxetine (PAXIL) 40 MG tablet Take 40 mg by mouth daily.    [provider]  ramelteon (ROZEREM) 8 MG tablet Take 8 mg by mouth at bedtime.    [provider]  RESTASIS 0.05 % ophthalmic emulsion Place 1 drop into both eyes 2 (two) times daily as needed (dry eyes). 06/09/19   [provider]  rosuvastatin (CRESTOR) 10 MG tablet Take 1 tablet (10 mg total) by mouth daily. 03/19/21   Supple, Megan E, RPH-CPP  tiaGABine (GABITRIL) 4 MG tablet Take 4 mg by mouth See admin instructions. Take 2 mg by mouth twice  daily, then in 7 days go to 1 tablet by mouth twice daily    [provider]  torsemide (DEMADEX) 20 MG tablet Take 1 tablet (20 mg total) by mouth every other day. 03/05/21   Deboraha Sprang, MD    Allergies    Hctz [hydrochlorothiazide], Lexapro [escitalopram oxalate], and Lisinopril  Review of Systems   Review of Systems  Constitutional:  Negative for chills and fever.  HENT:  Negative for facial swelling and trouble swallowing.   Eyes:  Negative for photophobia and visual disturbance.  Respiratory:  Positive for shortness of breath. Negative for cough.   Cardiovascular:  Negative for chest pain and palpitations.  Gastrointestinal:  Negative for abdominal pain, nausea and vomiting.  Endocrine: Negative for polydipsia and polyuria.  Genitourinary:  Negative for difficulty urinating and hematuria.  Musculoskeletal:  Negative for gait problem and joint swelling.  Skin:  Negative for pallor and rash.  Neurological:  Negative for syncope and headaches.  Psychiatric/Behavioral:  Negative for agitation and confusion. The patient is nervous/anxious.    Physical Exam Updated Vital Signs BP (!) 144/94   Pulse 78   Temp 97.6 F (36.4 C) (Oral)   Resp 15   SpO2 100%   Physical Exam Vitals and nursing note reviewed.  Constitutional:      General: He is not in acute distress.    Appearance: He is well-developed. He is obese. He is not ill-appearing or diaphoretic.  HENT:     Head: Normocephalic and atraumatic.     Right Ear: External ear normal.     Left Ear: External ear normal.     Mouth/Throat:     Mouth: Mucous membranes are moist.  Eyes:     General: No scleral icterus. Cardiovascular:     Rate and Rhythm: Normal rate and regular rhythm.     Pulses: Normal pulses.     Heart sounds: Normal heart sounds.  Pulmonary:     Effort: Pulmonary effort is normal. No respiratory distress.     Breath sounds: Decreased breath sounds present.  Abdominal:     General: Abdomen  is flat.     Palpations: Abdomen is soft.     Tenderness: There is no abdominal tenderness.  Musculoskeletal:        General: Normal range of motion.     Cervical back: Normal range of motion.     Right lower leg: No edema.     Left lower leg: No edema.  Skin:    General: Skin is warm and dry.     Capillary Refill: Capillary refill takes less than 2 seconds.  Neurological:     Mental Status: He is alert and oriented  to person, place, and time.  Psychiatric:        Mood and Affect: Mood is anxious.        Behavior: Behavior normal.    ED Results / Procedures / Treatments   Labs (all labs ordered are listed, but only abnormal results are displayed) Labs Reviewed  BASIC METABOLIC PANEL - Abnormal; Notable for the following components:      Result Value   Glucose, Bld 100 (*)    BUN 27 (*)    Creatinine, Ser 2.02 (*)    GFR, Estimated 33 (*)    All other components within normal limits  CBC WITH DIFFERENTIAL/PLATELET - Abnormal; Notable for the following components:   Lymphs Abs 0.6 (*)    All other components within normal limits  D-DIMER, QUANTITATIVE  TROPONIN I (HIGH SENSITIVITY)    EKG EKG Interpretation  Date/Time:  Sunday April 15 2021 13:00:11 EST Ventricular Rate:  96 PR Interval:    QRS Duration: 162 QT Interval:  438 QTC Calculation: 553 R Axis:   -88 Text Interpretation: Ventricular-paced rhythm Abnormal ECG Similar to prior Confirmed by Wynona Dove ([id number]) on 04/15/2021 1:43:42 PM  Radiology DG Chest 2 View  Result Date: 04/15/2021 CLINICAL DATA:  Shortness of breath for the past 3-5 days. EXAM: CHEST - 2 VIEW COMPARISON:  Chest x-ray dated April 10, 2021. FINDINGS: Unchanged left chest wall pacemaker. Stable cardiomegaly. Normal pulmonary vascularity. No focal consolidation, pleural effusion, or pneumothorax. No acute osseous abnormality. IMPRESSION: 1. No acute cardiopulmonary disease. Electronically Signed   By: Titus Dubin M.D.   On: 04/15/2021  13:51    Procedures Procedures   Medications Ordered in ED Medications  albuterol (VENTOLIN HFA) 108 (90 Base) MCG/ACT inhaler 2 puff (2 puffs Inhalation Given 04/15/21 1431)  LORazepam (ATIVAN) tablet 1 mg (1 mg Oral Given 04/15/21 1431)    ED Course  I have reviewed the triage vital signs and the nursing notes.  Pertinent labs & imaging results that were available during my care of the patient were reviewed by me and considered in my medical decision making (see chart for details).    MDM Rules/Calculators/A&P                           CC: dib, anxiousness  This patient complains of above; this involves an extensive number of treatment options and is a complaint that carries with it a high risk of complications and morbidity. Vital signs were reviewed. Serious etiologies considered.  Low risk well-s score, obtain d-dimer  Record review:  Previous records obtained and reviewed   Additional history obtained from family  at bedside  Work up as above, notable for:  Labs & imaging results that were available during my care of the patient were reviewed by me and considered in my medical decision making.   I ordered imaging studies which included CXR and I independently visualized and interpreted imaging which showed no acute process.  EKG without acute ischemia, he has no chest pain currently, ACS seems less likely at this time.  Management: Given ativan PO   Reassessment:  Pt feeling better after ativan, he has no respiratory distress, not hypoxic, no wheezing appreciated. No conversational dyspnea.  At this time patient to be signed out to incoming physician, pending troponin and d-dimer. If these are negative and patient feeling better would recommend discharge. If abnormal may require admission or further workup.  This chart was dictated using voice recognition software.  Despite best efforts to proofread,  errors can occur which can change the  documentation meaning.  Final Clinical Impression(s) / ED Diagnoses Final diagnoses:  SOB (shortness of breath)  Anxiousness    Rx / DC Orders ED Discharge Orders     None        Jeanell Sparrow, DO 04/15/21 1528

## 2021-04-18 DIAGNOSIS — F39 Unspecified mood [affective] disorder: Secondary | ICD-10-CM | POA: Diagnosis not present

## 2021-04-18 DIAGNOSIS — F419 Anxiety disorder, unspecified: Secondary | ICD-10-CM | POA: Diagnosis not present

## 2021-04-25 DIAGNOSIS — J208 Acute bronchitis due to other specified organisms: Secondary | ICD-10-CM | POA: Diagnosis not present

## 2021-05-02 DIAGNOSIS — F419 Anxiety disorder, unspecified: Secondary | ICD-10-CM | POA: Diagnosis not present

## 2021-05-02 DIAGNOSIS — F39 Unspecified mood [affective] disorder: Secondary | ICD-10-CM | POA: Diagnosis not present

## 2021-05-04 ENCOUNTER — Encounter: Payer: Self-pay | Admitting: Cardiovascular Disease

## 2021-05-04 ENCOUNTER — Other Ambulatory Visit: Payer: Self-pay

## 2021-05-04 ENCOUNTER — Ambulatory Visit: Payer: Medicare HMO | Admitting: Cardiovascular Disease

## 2021-05-04 DIAGNOSIS — F410 Panic disorder [episodic paroxysmal anxiety] without agoraphobia: Secondary | ICD-10-CM | POA: Diagnosis not present

## 2021-05-04 DIAGNOSIS — G4733 Obstructive sleep apnea (adult) (pediatric): Secondary | ICD-10-CM

## 2021-05-04 DIAGNOSIS — L409 Psoriasis, unspecified: Secondary | ICD-10-CM

## 2021-05-04 DIAGNOSIS — I5042 Chronic combined systolic (congestive) and diastolic (congestive) heart failure: Secondary | ICD-10-CM | POA: Diagnosis not present

## 2021-05-04 DIAGNOSIS — I480 Paroxysmal atrial fibrillation: Secondary | ICD-10-CM

## 2021-05-04 DIAGNOSIS — Z79899 Other long term (current) drug therapy: Secondary | ICD-10-CM | POA: Diagnosis not present

## 2021-05-04 DIAGNOSIS — Z950 Presence of cardiac pacemaker: Secondary | ICD-10-CM | POA: Diagnosis not present

## 2021-05-04 DIAGNOSIS — N1832 Chronic kidney disease, stage 3b: Secondary | ICD-10-CM | POA: Diagnosis not present

## 2021-05-04 DIAGNOSIS — I350 Nonrheumatic aortic (valve) stenosis: Secondary | ICD-10-CM

## 2021-05-04 DIAGNOSIS — Z7901 Long term (current) use of anticoagulants: Secondary | ICD-10-CM

## 2021-05-04 MED ORDER — TORSEMIDE 20 MG PO TABS: 20.0000 mg | ORAL_TABLET | ORAL | 3 refills | Status: AC | PRN

## 2021-05-04 MED ORDER — TORSEMIDE 20 MG PO TABS: 20.0000 mg | ORAL_TABLET | ORAL | 0 refills | Status: DC | PRN

## 2021-05-04 NOTE — Progress Notes (Signed)
Cardiology Office Note    Date:  05/06/2021   ID:  [name], DOB [birth year], MRN [id number]  PCP:  [name], MD  Cardiologist:  Shelva Majestic, MD   F/U cardiology evaluation  History of Present Illness:  [name] is a 80 y.o. male who was initially referred through the courtesy of Dr. Marisue Humble for evaluation of bradycardia and shortness of breath.  The patient is the first cousin of Nigel Berthold who established cardiology care with me.  I initially saw him on July 14, 2017 and last saw him on June 11, 2018.  He was recently seen by Dr. Caryl Comes and follow-up evaluation with me was recommended.  [name] has history of hypertension, diabetes mellitus, hyperlipidemia, in addition to hypothyroidism.  He has had a history of presumed anxiety and panic attacks.  He admits to significant dyspnea on exertion and also has noticed episodes of significant shortness of breath while sleeping.  Upon further questioning, patient omits to loud snoring.  He wakes up gasping for breath and then apparently has a panic attack.  Sleep is nonrestorative.  He admits to daytime sleepiness.  He has never been evaluated for sleep apnea.  He was recently seen by Dr. Marisue Humble and his ECG was reported to show sinus bradycardia with heart rates in the low 40s.  He denies any presyncope or syncope.  He is not very active.  He has morbid obesity.  He also has a significant history for psoriasis.  Laboratory in December 2018 by Dr.Ehinger had shown mild renal insufficiency with a creatinine of 1.50.  Cholesterol was 119 with an LDL of 64.  H  When I initially saw him, his ECG suggested probable A-V dissociation with an atrial rate at approximately 83 bpm variable ventricular rate in the 60s to 70s with right bundle branch block morphology and left axis deviation.  He was continuing to have shortness of breath and I further titrated furosemide to 40 mg.  His metoprolol had been stopped prior to his office visit with  me.  I was very concerned that he most likely had obstructive sleep apnea which could also contribute to nocturnal bradycardic arrhythmias.  I scheduled him for a sleep study which revealed at least moderate sleep apnea with an AHI of 24.6 on the diagnostic portion of the study; however, the patient was unable to achieve any rim sleep during this segment of the test.  CPAP was initiated and titrated up to 18 cm water pressure.  Despite this pressure.  He continued have significant oxygen desaturation to a nadir of 77%.  I had recommended BiPAP titration giving his suboptimal CPAP titration evaluation but this has not yet been done.  He also wore a monitor which showed periods of time.  His rhythm with first-degree AV block, and PACs, second-degree AV block, and periods of 2-1 AV block, and apparent A-V dissociation.  The patient was asymptomatic.  A 2-D echo Doppler study showed an EF of 50-55%.  There was a thickened aortic valve with mild aortic stenosis with a peak gradient of 21 and mean gradient of 12.  There was mitral annular calcification.  There was moderate biatrial enlargement.  Acoustical window was poor.    When I saw him in June 2019 his ECG suggested A-V dissociation.  He was referred to Dr. 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  He underwent pacemaker implantation  on November 19, 2017  with a [facility] device and  felt improved.  He did not tolerate CPAP and I had recommended BiPAP titration study.  Apparently he has a machine but was  not been using treatment since initially he was concerned about high pressures.  His peak weight was 382 and had reduced to 342 and recently has gained some weight back to 354.  At his last evaluation with me in January 2020 I had made adjustments to his BiPAP parameters.  I have not seen him in almost 3 years.  He saw Dr. Caryl Comes in March 05, 2021 he has a history  of AF.  During his last evaluation his Eliquis was stopped and he was told to begin Xarelto.  In addition furosemide was stopped and he was to begin torsemide.  He underwent a 2D echo Doppler study on March 23, 2021 which showed an EF of 45 to 50% with global hypokinesis and grade 1 diastolic dysfunction.  There was mild thickening of his aortic valve with a mean gradient of 10.8 mm.  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  Patient presents to the office today stating that he has had issues with panic attacks and increased anxiety.  He never used BiPAP therapy.  He is in need to undergo esophageal dilatation and will need clearance for this to be done by Dr. Trilby Leaver.  He has CKD and most recent creatinine increased to 2.02 on April 15, 2021.  Presently he is on losartan 50 mg daily, torsemide 20 mg every other day Eliquis 5 mg twice a day instead of Xarelto, and is on rosuvastatin 10 mg for hyperlipidemia.  He is on levothyroxine 175 mcg and Paxil.  He presents for evaluation.  Past Medical History:  Diagnosis Date   CKD (chronic kidney disease)    DM type 2 (diabetes mellitus, type 2) (HCC)    ED (erectile dysfunction)    Fatty liver    GERD (gastroesophageal reflux disease)    Gout    H/O adenomatous polyp of colon    HLD (hyperlipidemia)    HTN (hypertension)    Hypothyroid    Panic attacks    Psoriasis     Past medical history is notable for type 2 diabetes mellitus, hyperlipidemia, hypothyroidism, hypertension, obesity, psoriasis, GERD, erectile dysfunction, chronic kidney disease.  Current Medications: Outpatient Medications Prior to Visit  Medication Sig Dispense Refill   ACCU-CHEK SMARTVIEW test strip      apixaban (ELIQUIS) 5 MG TABS tablet Take 1 tablet (5 mg total) by mouth 2 (two) times daily. 180 tablet 3   Dextromethorphan-guaiFENesin (MUCINEX DM MAXIMUM STRENGTH PO) Take 20 mLs by mouth every 4 (four) hours as needed.     levothyroxine (SYNTHROID,  LEVOTHROID) 175 MCG tablet Take 175 mcg by mouth daily.      LORazepam (ATIVAN) 1 MG tablet 2m by mouth 2-3 times a day     losartan (COZAAR) 50 MG tablet Take 1 tablet (50 mg total) by mouth daily.     Melatonin 3 MG CAPS Take 3 mg by mouth at bedtime as needed (sleep).     mirtazapine (REMERON) 15 MG tablet Take 15 mg by mouth at bedtime.     PARoxetine (PAXIL) 40 MG tablet Take 40 mg by mouth daily.     ramelteon (ROZEREM) 8 MG tablet Take 8 mg by mouth at bedtime.     RESTASIS 0.05 % ophthalmic emulsion Place 1 drop into both  eyes 2 (two) times daily as needed (dry eyes).     rosuvastatin (CRESTOR) 10 MG tablet Take 1 tablet (10 mg total) by mouth daily. 30 tablet 3   tiaGABine (GABITRIL) 4 MG tablet Take 4 mg by mouth See admin instructions. Take 2 mg by mouth twice daily, then in 7 days go to 1 tablet by mouth twice daily     torsemide (DEMADEX) 20 MG tablet Take 1 tablet (20 mg total) by mouth every other day. 60 tablet 0   famotidine (PEPCID) 20 MG tablet Take 20 mg by mouth at bedtime as needed. (Patient not taking: Reported on 05/04/2021)     No facility-administered medications prior to visit.     Allergies:   Hctz [hydrochlorothiazide], Lexapro [escitalopram oxalate], and Lisinopril   Social History   Socioeconomic History   Marital status: Married    Spouse name: Not on file   Number of children: Not on file   Years of education: Not on file   Highest education level: Not on file  Occupational History   Not on file  Tobacco Use   Smoking status: Never   Smokeless tobacco: Never  Substance and Sexual Activity   Alcohol use: No    Alcohol/week: 0.0 standard drinks   Drug use: No   Sexual activity: Not on file  Other Topics Concern   Not on file  Social History Narrative   Not on file   Social Determinants of Health   Financial Resource Strain: Not on file  Food Insecurity: Not on file  Transportation Needs: Not on file  Physical Activity: Not on file  Stress:  Not on file  Social Connections: Not on file    Additional social history is notable that he is married 49 years.  He has 4 children, 9 grandchildren, and 4 great-grandchildren and 5 great great-grandchildren.  He completed 2 years of college.  He is retired from [employer].  There is no tobacco use.  He does not exercise.  Family History:  Family history is notable that his mother died at age 3.  She never recovered after she broke her hip.  Her father died at age 83 and had emphysema.  One brother died of an MI at age 33.  Another brother died at age 56 and had methicillin-resistant staph infection.  One sister died at age 47 with cancer, another sister had lung cancer, and one sister is living in her 35s.  ROS General: Negative; No fevers, chills, or night sweats;  HEENT: Negative; No changes in vision or hearing, sinus congestion, difficulty swallowing Pulmonary: Positive for shortness of breath and wheezing Cardiovascular: see HPI GI: Negative; No nausea, vomiting, diarrhea, or abdominal pain GU: Negative; No dysuria, hematuria, or difficulty voiding Musculoskeletal: History of gout Hematologic/Oncology: Negative; no easy bruising, bleeding Endocrine: Positive for hypothyroidism and type 2 diabetes mellitus Neuro: Negative; no changes in balance, headaches Skin: Negative; No rashes or skin lesions Psychiatric: Positive for anxiety Sleep: Positive for OSA with snoring, daytime sleepiness; no bruxism, restless legs, hypnogognic hallucinations, no cataplexy; has not used BiPAP Other comprehensive 14 point system review is negative.   PHYSICAL EXAM:   VS:  BP 124/64   Pulse 82   Ht 6' (1.829 m)   Wt (!) 318 lb 6.4 oz (144.4 kg)   SpO2 99%   BMI 43.18 kg/m     Repeat blood pressure by me 108/64  Wt Readings from Last 3 Encounters:  05/04/21 (!) 318 lb 6.4 oz (144.4  kg)  03/05/21 (!) 351 lb (159.2 kg)  02/14/20 (!) 350 lb (158.8 kg)     General: Alert, oriented, no distress.   Skin: Dry skin; psoriatic lesions diffuse HEENT: Normocephalic, atraumatic. Pupils equal round and reactive to light; sclera anicteric; extraocular muscles intact;  Nose without nasal septal hypertrophy Mouth/Parynx benign; Mallinpatti scale 4 Neck: Thick neck; no JVD, no carotid bruits; normal carotid upstroke Lungs: clear to ausculatation and percussion; no wheezing or rales Chest wall: without tenderness to palpitation Heart: PMI not displaced, RRR, s1 s2 normal, 1/6 systolic murmur, no diastolic murmur, no rubs, gallops, thrills, or heaves Abdomen: Central adiposity soft, nontender; no hepatosplenomehaly, BS+; abdominal aorta nontender and not dilated by palpation. Back: no CVA tenderness Pulses 2+ Musculoskeletal: full range of motion, normal strength, no joint deformities Extremities: no clubbing cyanosis or edema, Homan's sign negative  Neurologic: grossly nonfocal; Cranial nerves grossly wnl Psychologic: Normal mood and affect   Studies/Labs Reviewed:   May 04, 2021 ECG (independently read by me):  Atrial sensed, V paced at 82,  January 2020 ECG (independently read by me): Atrially sensing ventricular paced rhythm at 87 bpm  November 12, 2017 ECG (independently read by me): Abnormal A-V dissociation with an atrial rate at L5 ventricular rate at 64.  Right bundle branch block morphology.    July 14, 2017 ECG (independently read by me): Probable A-V dissociation with atrial rate approximatel 83 bpm.  Ventricular rate is variable in the 60s to 70 range with right bundle branch block morphology.  Left axis deviation.  Recent Labs: BMP Latest Ref Rng & Units 04/15/2021 04/10/2021 03/19/2021  Glucose 70 - 99 mg/dL 100(H) 101(H) 113(H)  BUN 8 - 23 mg/dL 27(H) 25(H) 24  Creatinine 0.61 - 1.24 mg/dL 2.02(H) 1.94(H) 1.96(H)  BUN/Creat Ratio 10 - 24 - - 12  Sodium 135 - 145 mmol/L 139 138 141  Potassium 3.5 - 5.1 mmol/L 4.5 4.6 4.2  Chloride 98 - 111 mmol/L 101 103 100  CO2 22 -  32 mmol/L 27 26 23   Calcium 8.9 - 10.3 mg/dL 9.4 9.5 9.6     Hepatic Function Latest Ref Rng & Units 04/10/2021  Total Protein 6.5 - 8.1 g/dL 6.5  Albumin 3.5 - 5.0 g/dL 3.9  AST 15 - 41 U/L 22  ALT 0 - 44 U/L 17  Alk Phosphatase 38 - 126 U/L 67  Total Bilirubin 0.3 - 1.2 mg/dL 1.7(H)    CBC Latest Ref Rng & Units 04/15/2021 04/10/2021 03/18/2019  WBC 4.0 - 10.5 K/uL 6.3 6.5 6.0  Hemoglobin 13.0 - 17.0 g/dL 14.1 14.0 13.1  Hematocrit 39.0 - 52.0 % 45.1 44.4 43.2  Platelets 150 - 400 K/uL 178 165 213   Lab Results  Component Value Date   MCV 95.6 04/15/2021   MCV 93.3 04/10/2021   MCV 90.0 03/18/2019   Lab Results  Component Value Date   TSH 0.032 (L) 04/10/2021   No results found for: HGBA1C   BNP No results found for: BNP  ProBNP No results found for: PROBNP   Lipid Panel  No results found for: CHOL, TRIG, HDL, CHOLHDL, VLDL, LDLCALC, LDLDIRECT   RADIOLOGY: DG Chest 2 View  Result Date: 04/15/2021 CLINICAL DATA:  Shortness of breath for the past 3-5 days. EXAM: CHEST - 2 VIEW COMPARISON:  Chest x-ray dated April 10, 2021. FINDINGS: Unchanged left chest wall pacemaker. Stable cardiomegaly. Normal pulmonary vascularity. No focal consolidation, pleural effusion, or pneumothorax. No acute osseous abnormality. IMPRESSION: 1. No acute  cardiopulmonary disease. Electronically Signed   By: Titus Dubin M.D.   On: 04/15/2021 13:51   DG Chest 2 View  Result Date: 04/10/2021 CLINICAL DATA:  A 80 year old male presents with history of shortness of breath. EXAM: CHEST - 2 VIEW COMPARISON:  Chest x-ray from January of 2022. FINDINGS: Trachea is midline. LEFT-sided dual lead pacer device remains in place, power pack over the LEFT chest. Study limited by portable technique and patient body habitus. Stable appearance of cardiac silhouette. No gross evidence of effusion. No sign of consolidation. No visible pneumothorax. On limited assessment there is no acute skeletal process.  IMPRESSION: No acute cardiopulmonary disease. Electronically Signed   By: Zetta Bills M.D.   On: 04/10/2021 14:40     Additional studies/ records that were reviewed today include:  I reviewed the office records from Dr. [name]; sleep study, echo Doppler study, event monitor.  I reviewed the records of Dr. Caryl Comes regarding his pacemaker implantation and subsequent office visit    ------------------------------------------------------------------- July 17, 2017  ECHO study Conclusions   - Left ventricle: Systolic function was normal. The estimated   ejection fraction was in the range of 50% to 55%. - Aortic valve: AV is thickened, calcified with restricted motion   Peak and mean gradients through the valve are 21 and 12 mm Hg   respectively consistent with mild AS. - Mitral valve: Calcified annulus. Mildly thickened leaflets . - Left atrium: The atrium was moderately dilated. - Right ventricle: RVEF is difficult to assess even with use of   Definity Overall appears moderately depressed. The cavity size   was mildly dilated. - Right atrium: The atrium was moderately dilated.   Impressions:   - Poor acoustic windows limit study.  ASSESSMENT:    1. Chronic combined systolic and diastolic heart failure (HCC)   2. Paroxysmal atrial fibrillation ([place])   3. Anticoagulated   4. Pacemaker - STJ   5. Mild aortic stenosis   6. OSA (obstructive sleep apnea)   7. Medication management   8. Stage 3b chronic kidney disease ([place])   9. Panic attacks   10. Morbid obesity ([place])   11. Psoriasis     PLAN:  [name] is a 59 -year-old gentleman who has a history of morbid obesity, type 2 diabetes mellitus, hypothyroidism, hyperlipidemia, psoriasis, who had developed exertional dyspnea and was previously felt to have frequent panic attacks.  An echo Doppler study in February 2019 revealed low normal systolic function with an EF of 50 to 55%, mild aortic stenosis, mitral annular  calcification, and biatrial enlargement.  He subsequently developed complete heart block and underwent insertion of a [facility] Jude permanent pacemaker by Dr. Caryl Comes on November 19, 2017.  I have not seen him since January 2020.  At that time, he was having ankle swelling which was treated with furosemide.  His recent echo Doppler study from March 23, 2021 showed an EF of 45 to 50% with grade 1 diastolic dysfunction and very mild aortic stenosis with a mean gradient of 10.8 and peak gradient of 19.5.  He has had issues with panic attacks creating significant anxiety and had recently presented to the emergency room in April 15, 2021 and was prescribed Ativan.  Laboratory has shown increased creatinine now at 2.02.  His blood pressure today is on the low side and a repeat by me was 108/64.  As result, at this point I have recommended deferring potential transition to [facility] pending improvement in laboratory and  blood pressure.  He has no signs of edema and has been taking torsemide 20 mg every other day.  I have suggested he reduce this to 2 times per week and if no edema develops possibly just take this on a as needed basis.  I have given him clearance to undergo esophageal dilatation but he will need to hold Eliquis for 48 hours prior to that procedure.  I am recommending follow-up laboratory in 2 weeks with a comprehensive metabolic panel and BNP.  He continues to be on rosuvastatin 10 mg daily.  He has obstructive sleep apnea but has not used BiPAP as recommended.  I will see him in 2 to 3 months for follow-up evaluation or sooner if needed.   Medication Adjustments/Labs and Tests Ordered: Current medicines are reviewed at length with the patient today.  Concerns regarding medicines are outlined above.  Medication changes, Labs and Tests ordered today are listed in the Patient Instructions below. Patient Instructions  Medication Instructions:  DECREASE: Torsemide to 20 mg twice a week for a week or town,  then as needed for swelling.  Ok to hold Eliquis 48 hours prior to procedure.  *If you need a refill on your cardiac medications before your next appointment, please call your pharmacy*   Lab Work: Your physician recommends that you return for lab work in 2 weeks (CMP, BNP)  If you have labs (blood work) drawn today and your tests are completely normal, you will receive your results only by: [place] (if you have [place]) OR A paper copy in the mail If you have any lab test that is abnormal or we need to change your treatment, we will call you to review the results.   Testing/Procedures: None ordered    Follow-Up: At [facility], you and your health needs are our priority.  As part of our continuing mission to provide you with exceptional heart care, we have created designated Provider Care Teams.  These Care Teams include your primary Cardiologist (physician) and Advanced Practice Providers (APPs -  Physician Assistants and Nurse Practitioners) who all work together to provide you with the care you need, when you need it.  We recommend signing up for the patient portal called "MyChart".  Sign up information is provided on this After Visit Summary.  MyChart is used to connect with patients for Virtual Visits (Telemedicine).  Patients are able to view lab/test results, encounter notes, upcoming appointments, etc.  Non-urgent messages can be sent to your provider as well.   To learn more about what you can do with MyChart, go to [email or web address].    Your next appointment:   3 month(s)  The format for your next appointment:   In Person  Provider:   Shelva Majestic, MD        Signed, Shelva Majestic, MD  05/06/2021 4:45 PM    [place] [address] [place], [place], [place]  [address] Phone: [phone]

## 2021-05-04 NOTE — Patient Instructions (Signed)
Medication Instructions:  DECREASE: Torsemide to 20 mg twice a week for a week or town, then as needed for swelling.  Ok to hold Eliquis 48 hours prior to procedure.  *If you need a refill on your cardiac medications before your next appointment, please call your pharmacy*   Lab Work: Your physician recommends that you return for lab work in 2 weeks (CMP, BNP)  If you have labs (blood work) drawn today and your tests are completely normal, you will receive your results only by: [place] (if you have [place]) OR A paper copy in the mail If you have any lab test that is abnormal or we need to change your treatment, we will call you to review the results.   Testing/Procedures: None ordered    Follow-Up: At [facility], you and your health needs are our priority.  As part of our continuing mission to provide you with exceptional heart care, we have created designated Provider Care Teams.  These Care Teams include your primary Cardiologist (physician) and Advanced Practice Providers (APPs -  Physician Assistants and Nurse Practitioners) who all work together to provide you with the care you need, when you need it.  We recommend signing up for the patient portal called "MyChart".  Sign up information is provided on this After Visit Summary.  MyChart is used to connect with patients for Virtual Visits (Telemedicine).  Patients are able to view lab/test results, encounter notes, upcoming appointments, etc.  Non-urgent messages can be sent to your provider as well.   To learn more about what you can do with MyChart, go to [email or web address].    Your next appointment:   3 month(s)  The format for your next appointment:   In Person  Provider:   Shelva Majestic, MD

## 2021-05-06 ENCOUNTER — Encounter: Payer: Self-pay | Admitting: Cardiovascular Disease

## 2021-05-09 DIAGNOSIS — F39 Unspecified mood [affective] disorder: Secondary | ICD-10-CM | POA: Diagnosis not present

## 2021-05-09 DIAGNOSIS — F419 Anxiety disorder, unspecified: Secondary | ICD-10-CM | POA: Diagnosis not present

## 2021-05-10 DIAGNOSIS — R54 Age-related physical debility: Secondary | ICD-10-CM | POA: Diagnosis not present

## 2021-05-10 DIAGNOSIS — K5900 Constipation, unspecified: Secondary | ICD-10-CM | POA: Diagnosis not present

## 2021-05-10 DIAGNOSIS — R634 Abnormal weight loss: Secondary | ICD-10-CM | POA: Diagnosis not present

## 2021-05-10 DIAGNOSIS — E1169 Type 2 diabetes mellitus with other specified complication: Secondary | ICD-10-CM | POA: Diagnosis not present

## 2021-05-10 DIAGNOSIS — R319 Hematuria, unspecified: Secondary | ICD-10-CM | POA: Diagnosis not present

## 2021-05-10 DIAGNOSIS — R251 Tremor, unspecified: Secondary | ICD-10-CM | POA: Diagnosis not present

## 2021-05-12 DIAGNOSIS — R457 State of emotional shock and stress, unspecified: Secondary | ICD-10-CM | POA: Diagnosis not present

## 2021-05-12 DIAGNOSIS — I10 Essential (primary) hypertension: Secondary | ICD-10-CM | POA: Diagnosis not present

## 2021-05-13 ENCOUNTER — Ambulatory Visit (HOSPITAL_COMMUNITY)
Admission: EM | Admit: 2021-05-13 | Discharge: 2021-05-13 | Disposition: A | Discharge status: Home / Self Care | Payer: Medicare HMO | Attending: Psychiatry | Admitting: Psychiatry

## 2021-05-13 DIAGNOSIS — Z79899 Other long term (current) drug therapy: Secondary | ICD-10-CM | POA: Diagnosis not present

## 2021-05-13 DIAGNOSIS — F410 Panic disorder [episodic paroxysmal anxiety] without agoraphobia: Secondary | ICD-10-CM | POA: Diagnosis not present

## 2021-05-13 NOTE — ED Provider Notes (Signed)
Behavioral Health Urgent Care Medical Screening Exam  Patient Name: [name] MRN: [id number] Date of Evaluation: 05/13/21 Chief Complaint:  panic attacks Diagnosis:  Final diagnoses:  Panic attacks    History of Present illness: [name] is a 80 y.o. male patient who presents to the [facility] voluntarily as a walk-in accompanied by wife and daughter with a chief complaint of panic attacks.  Patient seen and evaluated face-to-face by this provider with his daughter and wife present, and chart reviewed. On evaluation, patient is alert and oriented x4. His thought process is logical and speech is clear and coherent. His mood is anxious and affect is congruent. He is noted to be sitting in a wheelchair.  Patient reports a history of panic attacks since 1991. Patient reports frequent panic attacks for the past three weeks. He describes his panic attacks as shaking, difficulty walking, nausea, jittery, and poor sleep. He denies any triggers or stressors attributing to him having panic attacks. He states that the ativan helps to reduce his anxiety. He asked if he could be given a dose of Ativan. He denies suicidal ideations. He denies homicidal ideations. He denies auditory and visual hallucinations. He does not appear to be responding to internal or external stimuli.   His daughter states that his anxiety worsened last year and he hasn't been able to fully recover since then. She states that he was prescribed Paxil last year and then switched to Celexa. She states that the Celexa was not effective and the patient was restarted back on Paxil.   The patient's wife states that the patient's psychiatrist Dr. Omer Jack recently made medication changes. She states that the patient has been taking lorazepam 1 mg 4 times daily for the past 5 to 6 months.  She states that his psychiatrist is weaning him off of the lorazepam and he is to take 3.5 mg total daily  until his next  appointment on Tuesday. She states that the Paxil was increased from 40-50 daily. She states that he was started on the tiagabine 4 mg BID for shakes. She states that he hasn't been taking the lorazepam taper as prescribed and yesterday he only took 1 mg.   I discussed with the patient taking his medications as prescribed. Patient encouraged to taper off the Ativan as prescribed by his psychiatrist. Patient educated on benzodiazepine withdrawal symptoms.  Patient advised that if the Ativan is stopped abruptly he could experience withdrawal symptoms of nausea, vomiting, diarrhea, hallucinations, and body aches.  Patient advised that benzodiazepines are used short-term to treat anxiety/panic attacks. Patient advised that benzodiazepines are not recommended that in the elderly population due to risk for falls and memory impairment. Patient encouraged to follow up with his psychiatrist Dr. Omer Jack on Tuesday to discuss medication options.  In addition, patient also advised to follow up with therapy. Patient verbalizes understanding and agrees to the stated plan.  Psychiatric Specialty Exam  Presentation  General Appearance:Appropriate for Environment  Eye Contact:Fair  Speech:Clear and Coherent  Speech Volume:Normal  Handedness:No data recorded  Mood and Affect  Mood:Anxious  Affect:Congruent   Thought Process  Thought Processes:Coherent  Descriptions of Associations:Intact  Orientation:Full (Time, Place and Person)  Thought Content:Logical    Hallucinations:None  Ideas of Reference:None  Suicidal Thoughts:No  Homicidal Thoughts:No   Sensorium  Memory:Immediate Fair; Recent Fair; Remote [place]  Insight:Fair   Executive Functions  Concentration:Fair  Attention Span:Fair  [place]  Language:Fair  Psychomotor Activity  Psychomotor Activity:Normal   Assets  Assets:Communication Skills; Desire for Improvement; Financial  Resources/Insurance; Housing; Intimacy; Leisure Time; Resilience; Transportation   Sleep  Sleep:Poor  Number of hours: 5   Physical Exam: Physical Exam Constitutional:      Appearance: Normal appearance.  HENT:     Head: Normocephalic.     Nose: Nose normal.  Cardiovascular:     Rate and Rhythm: Normal rate.  Pulmonary:     Effort: Pulmonary effort is normal.  Musculoskeletal:     Cervical back: Normal range of motion.  Skin:    General: Skin is dry.  Neurological:     Mental Status: He is alert and oriented to person, place, and time.   Review of Systems  Constitutional: Negative.   HENT: Negative.    Eyes: Negative.   Respiratory: Negative.    Cardiovascular: Negative.   Gastrointestinal:  Positive for nausea.  Genitourinary: Negative.   Musculoskeletal: Negative.   Neurological:  Positive for tremors.  Psychiatric/Behavioral:  The patient is nervous/anxious.   Blood pressure (!) 141/87, pulse 90, resp. rate 16, SpO2 97 %. There is no height or weight on file to calculate BMI.  Musculoskeletal: Strength & Muscle Tone: within normal limits Gait & Station:  UTA-pt in wheelchair  Patient leans: N/A   [facility] MSE Discharge Disposition for Follow up and Recommendations: Based on my evaluation the patient does not appear to have an emergency medical condition and can be discharged with resources and follow up care in outpatient services for Medication Management, Partial Hospitalization Program, Individual Therapy, and Group Therapy   Please contact one of the following facilities to start therapy services:   [facility] [address]  [place], [place] [address] [phone]   [place]  [place], [place] [address] [phone]  [place]  [address] [place], [place] [address] [phone]  [facility]  [address]  [place]  [place], [place] [address] [phone]  [facility]  [address] [place], [place] [address] [phone]  [place]  [address],  [place], [place] [address] [phone]   Marissa Calamity, NP 05/13/2021, 12:00 PM

## 2021-05-13 NOTE — Discharge Instructions (Addendum)
Please contact one of the following facilities to start therapy services:   [facility] [address]  [place], [place] [address] [phone]   [place]  [address] [place], [place] [address] [phone]  [place]  [address] [place], [place] [address] [phone]  [facility]  [address] [place], [place] [address] [phone]  [facility]  [place], [place] [address] [phone]  [place]  [address],  [place], [place] [address] [phone]  Discharge recommendations:  Patient is to take medications as prescribed. Please see information for follow-up appointment with psychiatry and therapy. Please follow up with your primary care provider for all medical related needs.    Therapy: We recommend that patient participate in individual therapy to address mental health concerns.  Medications: The parent/guardian is to contact a medical professional and/or outpatient provider to address any new side effects that develop. Parent/guardian should update outpatient providers of any new medications and/or medication changes.   Safety:  The patient should abstain from use of illicit substances/drugs and abuse of any medications. If symptoms worsen or do not continue to improve or if the patient becomes actively suicidal or homicidal then it is recommended that the patient return to the closest hospital emergency department, the [facility], or call 911 for further evaluation and treatment. National Suicide Prevention Lifeline 1-800-SUICIDE or (952) 344-4413.  About 988 988 offers 24/7 access to trained crisis counselors who can help people experiencing mental health-related distress. People can call or text 988 or chat 988lifeline.org for  themselves or if they are worried about a loved one who may need crisis support.

## 2021-05-13 NOTE — Progress Notes (Signed)
Patient is a 80 year old male that presents this date with family members reporting ongoing anxiety and panic attacks. Patient denies any S/I, H/I or AVH. Patient is currently receiving OP services from [facility] who is his psychiatrist. Patient met with that provider on Wednesday who increased his Prozac from 40 to 50 mg and decreased his Ativan from 4 mg daily to a taper over the next few weeks. Patient states he is presenting this date because he is not tolerating that reduction of Ativan and contnues to experience ongoing symptoms associated with anxiety.  Patient contacted EMS last night with same symptoms who responded to his residence and assisted with a EKG and obtaining vitals. Patient was not transported to any emergency facility at that time. Patient is requesting further assistance with ongoing symptoms of anxiety.

## 2021-05-13 NOTE — ED Notes (Signed)
Pt discharged with  AVS.  AVS reviewed prior to discharge.  Pt alert, oriented, and ambulatory.  Safety maintained.  °

## 2021-05-13 NOTE — BH Assessment (Signed)
Patient is a 80 year old male that presents this date with family members reporting ongoing anxiety and panic attacks. Patient denies any S/I, H/I or AVH. Patient is currently receiving OP services from [facility] MD who is his psychiatrist. Patient met with that provider on Wednesday who increased his Prozac from 40 to 50 mg and decreased his Ativan from 4 mg daily to a taper over the next few weeks. Patient states he is presenting this date because he is not tolerating that reduction of Ativan and contnues to experience ongoing symptoms associated with anxiety.  Patient contacted EMS last night with same symptoms who responded to his residence and assisted with a EKG and obtaining vitals. Patient was not transported to any emergency facility at that time. Patient is requesting further assistance with ongoing symptoms of anxiety.

## 2021-05-16 DIAGNOSIS — F419 Anxiety disorder, unspecified: Secondary | ICD-10-CM | POA: Diagnosis not present

## 2021-05-16 DIAGNOSIS — F39 Unspecified mood [affective] disorder: Secondary | ICD-10-CM | POA: Diagnosis not present

## 2021-05-21 ENCOUNTER — Telehealth (HOSPITAL_COMMUNITY): Payer: Self-pay | Admitting: Family Medicine

## 2021-05-21 NOTE — BH Assessment (Signed)
Care Management - Follow Up [place] made contact with the patient and his wife. Per patient's wife the patient will be following up with his established psychiatrist Dr. Omer Jack .

## 2021-05-22 DIAGNOSIS — F410 Panic disorder [episodic paroxysmal anxiety] without agoraphobia: Secondary | ICD-10-CM | POA: Diagnosis not present

## 2021-05-23 ENCOUNTER — Ambulatory Visit (INDEPENDENT_AMBULATORY_CARE_PROVIDER_SITE_OTHER): Payer: Medicare HMO

## 2021-05-23 DIAGNOSIS — I442 Atrioventricular block, complete: Secondary | ICD-10-CM

## 2021-05-23 LAB — CUP PACEART REMOTE DEVICE CHECK
Battery Remaining Longevity: 65 mo
Battery Remaining Percentage: 64 %
Battery Voltage: 2.99 V
Brady Statistic AP VP Percent: 7.7 %
Brady Statistic AP VS Percent: 1 %
Brady Statistic AS VP Percent: 92 %
Brady Statistic AS VS Percent: 1 %
Brady Statistic RA Percent Paced: 7.3 %
Brady Statistic RV Percent Paced: 99 %
Date Time Interrogation Session: 20221221020014
Implantable Lead Implant Date: 20190624
Implantable Lead Implant Date: 20190624
Implantable Lead Location: 753859
Implantable Lead Location: 753860
Implantable Lead Model: 5076
Implantable Lead Model: 5076
Implantable Pulse Generator Implant Date: 20190624
Lead Channel Impedance Value: 460 Ohm
Lead Channel Impedance Value: 490 Ohm
Lead Channel Pacing Threshold Amplitude: 0.75 V
Lead Channel Pacing Threshold Amplitude: 0.75 V
Lead Channel Pacing Threshold Pulse Width: 0.5 ms
Lead Channel Pacing Threshold Pulse Width: 0.5 ms
Lead Channel Sensing Intrinsic Amplitude: 11.3 mV
Lead Channel Sensing Intrinsic Amplitude: 5 mV
Lead Channel Setting Pacing Amplitude: 2 V
Lead Channel Setting Pacing Amplitude: 2.5 V
Lead Channel Setting Pacing Pulse Width: 0.5 ms
Lead Channel Setting Sensing Sensitivity: 2 mV
Pulse Gen Model: 2272
Pulse Gen Serial Number: 9033095

## 2021-05-25 DIAGNOSIS — I480 Paroxysmal atrial fibrillation: Secondary | ICD-10-CM | POA: Diagnosis not present

## 2021-05-25 DIAGNOSIS — G4733 Obstructive sleep apnea (adult) (pediatric): Secondary | ICD-10-CM | POA: Diagnosis not present

## 2021-05-25 DIAGNOSIS — I350 Nonrheumatic aortic (valve) stenosis: Secondary | ICD-10-CM | POA: Diagnosis not present

## 2021-05-25 DIAGNOSIS — Z950 Presence of cardiac pacemaker: Secondary | ICD-10-CM | POA: Diagnosis not present

## 2021-05-25 DIAGNOSIS — Z79899 Other long term (current) drug therapy: Secondary | ICD-10-CM | POA: Diagnosis not present

## 2021-05-25 DIAGNOSIS — I5042 Chronic combined systolic (congestive) and diastolic (congestive) heart failure: Secondary | ICD-10-CM | POA: Diagnosis not present

## 2021-05-25 DIAGNOSIS — Z7901 Long term (current) use of anticoagulants: Secondary | ICD-10-CM | POA: Diagnosis not present

## 2021-05-26 LAB — COMPREHENSIVE METABOLIC PANEL
ALT: 11 IU/L (ref 0–44)
AST: 18 IU/L (ref 0–40)
Albumin/Globulin Ratio: 1.9 (ref 1.2–2.2)
Albumin: 4.3 g/dL (ref 3.7–4.7)
Alkaline Phosphatase: 78 IU/L (ref 44–121)
BUN/Creatinine Ratio: 10 (ref 10–24)
BUN: 17 mg/dL (ref 8–27)
Bilirubin Total: 1.3 mg/dL — ABNORMAL HIGH (ref 0.0–1.2)
CO2: 23 mmol/L (ref 20–29)
Calcium: 10.1 mg/dL (ref 8.6–10.2)
Chloride: 96 mmol/L (ref 96–106)
Creatinine, Ser: 1.64 mg/dL — ABNORMAL HIGH (ref 0.76–1.27)
Globulin, Total: 2.3 g/dL (ref 1.5–4.5)
Glucose: 79 mg/dL (ref 70–99)
Potassium: 4.6 mmol/L (ref 3.5–5.2)
Sodium: 134 mmol/L (ref 134–144)
Total Protein: 6.6 g/dL (ref 6.0–8.5)
eGFR: 42 mL/min/{1.73_m2} — ABNORMAL LOW (ref >59)

## 2021-05-26 LAB — BRAIN NATRIURETIC PEPTIDE: BNP: 114.9 pg/mL — ABNORMAL HIGH (ref 0.0–100.0)

## 2021-05-29 ENCOUNTER — Telehealth: Payer: Self-pay

## 2021-05-29 DIAGNOSIS — F39 Unspecified mood [affective] disorder: Secondary | ICD-10-CM | POA: Diagnosis not present

## 2021-05-29 DIAGNOSIS — F419 Anxiety disorder, unspecified: Secondary | ICD-10-CM | POA: Diagnosis not present

## 2021-05-29 NOTE — Telephone Encounter (Signed)
Spoke with patient's daughter Lynelle Smoke and scheduled an in-person Palliative Consult for 06/04/21 @ 12:30PM.   COVID screening was negative. No pets in home. Patient lives with spouse Hoyle Sauer.  Consent obtained; updated Outlook/Netsmart/Team List and Epic.   Family is aware they may be receiving a call from provider the day before or day of to confirm appointment.

## 2021-05-30 ENCOUNTER — Other Ambulatory Visit: Payer: Self-pay | Admitting: Gastroenterology

## 2021-05-30 ENCOUNTER — Telehealth: Payer: Self-pay | Admitting: Internal Medicine

## 2021-05-30 NOTE — Telephone Encounter (Signed)
Patient with diagnosis of atrial fibrillation on Eliquis for anticoagulation.    Procedure: endoscopy Date of procedure: 06/05/21   CHA2DS2-VASc Score = 5   This indicates a 7.2% annual risk of stroke. The patient's score is based upon: CHF History: 1 HTN History: 1 Diabetes History: 1 Stroke History: 0 Vascular Disease History: 0 Age Score: 2 Gender Score: 0   CrCl 54 Platelet count 178  Per office protocol, patient can hold Eliquis for 2 days prior to procedure.   Patient will not need bridging with Lovenox (enoxaparin) around procedure.

## 2021-05-30 NOTE — Telephone Encounter (Signed)
Notes faxed to surgeon. This phone note will be removed from the preop pool. Richardson Dopp, PA-C  05/30/2021 5:04 PM

## 2021-05-30 NOTE — Telephone Encounter (Signed)
° °  Name: [name] DOB: 01/09/1941  MRN: [id number]  Primary Cardiologist: Shelva Majestic, MD  Chart reviewed as part of pre-operative protocol coverage.   The patient's history was reviewed by our clinical PharmD.  Per office protocol, patient can hold Eliquis for 2 days prior to procedure.   Patient will not need bridging with Lovenox (enoxaparin) around procedure.  Recommendations: The patient can hold Apixaban (Eliquis) for 2 days prior to his procedure.  Please resume as soon as possible post op when felt to be safe.    Please call with questions. Richardson Dopp, PA-C 05/30/2021, 5:01 PM

## 2021-05-30 NOTE — Telephone Encounter (Signed)
° °  Pre-operative Risk Assessment    Patient Name: [name]  DOB: [birth year] MRN: [id number]      Request for Surgical Clearance    Procedure:   endoscopy   Date of Surgery:  Clearance 06/05/21                                 Surgeon:  Dr Watt Climes Surgeon's Group or Practice Name:  [facility]  Phone number:  [phone] Fax number:  [phone]   Type of Clearance Requested:   - Pharmacy:  Hold Apixaban (Eliquis) instructions   Type of Anesthesia:   propofol    Additional requests/questions:   possible dil / possible botox  [name]   05/30/2021, 4:18 PM

## 2021-05-30 NOTE — Telephone Encounter (Signed)
Patient Name: [name]  DOB: 14-Oct-1940 MRN: [id number]    For endoscopy 06/05/21, request to hold Eliquis. Patient has hx of PAF.   Per Dr. Evette Georges note from last office visit on 05/04/21: I have given him clearance to undergo esophageal dilatation but he will need to hold Eliquis for 48 hours prior to that procedure.

## 2021-06-01 NOTE — Progress Notes (Signed)
Remote pacemaker transmission.   

## 2021-06-04 ENCOUNTER — Other Ambulatory Visit: Payer: Self-pay | Admitting: Hospice

## 2021-06-04 ENCOUNTER — Other Ambulatory Visit: Payer: Self-pay

## 2021-06-04 DIAGNOSIS — I480 Paroxysmal atrial fibrillation: Secondary | ICD-10-CM | POA: Diagnosis not present

## 2021-06-04 DIAGNOSIS — F410 Panic disorder [episodic paroxysmal anxiety] without agoraphobia: Secondary | ICD-10-CM

## 2021-06-04 DIAGNOSIS — Z515 Encounter for palliative care: Secondary | ICD-10-CM | POA: Diagnosis not present

## 2021-06-04 DIAGNOSIS — R269 Unspecified abnormalities of gait and mobility: Secondary | ICD-10-CM | POA: Diagnosis not present

## 2021-06-04 DIAGNOSIS — R1310 Dysphagia, unspecified: Secondary | ICD-10-CM | POA: Diagnosis not present

## 2021-06-04 NOTE — Anesthesia Preprocedure Evaluation (Addendum)
Anesthesia Evaluation  Patient identified by MRN, date of birth, ID band Patient awake    Reviewed: Allergy & Precautions, NPO status , Patient's Chart, lab work & pertinent test results  History of Anesthesia Complications Negative for: history of anesthetic complications  Airway Mallampati: IV  TM Distance: <3 FB Neck ROM: Full    Dental   Pulmonary neg pulmonary ROS,    Pulmonary exam normal        Cardiovascular hypertension, Pt. on medications +CHF  Normal cardiovascular exam+ dysrhythmias Atrial Fibrillation + pacemaker + Valvular Problems/Murmurs (mild) AS    Echo 03/23/21: EF 45-50%, global hypokinesis, g1dd, normal RV, mild AS (MG 10.8), aortic root 43 mm   Neuro/Psych Anxiety negative neurological ROS     GI/Hepatic Neg liver ROS, GERD  ,dysphagia   Endo/Other  diabetes, Type 2Hypothyroidism Morbid obesity  Renal/GU CRFRenal disease (Cr 1.64)  negative genitourinary   Musculoskeletal  (+) Arthritis  (gout),   Abdominal   Peds  Hematology negative hematology ROS (+)   Anesthesia Other Findings   Reproductive/Obstetrics                           Anesthesia Physical Anesthesia Plan  ASA: 3  Anesthesia Plan: MAC   Post-op Pain Management: Minimal or no pain anticipated   Induction: Intravenous  PONV Risk Score and Plan: 1 and Propofol infusion, TIVA and Treatment may vary due to age or medical condition  Airway Management Planned: Natural Airway, Nasal Cannula and Simple Face Mask  Additional Equipment: None  Intra-op Plan:   Post-operative Plan:   Informed Consent: I have reviewed the patients History and Physical, chart, labs and discussed the procedure including the risks, benefits and alternatives for the proposed anesthesia with the patient or authorized representative who has indicated his/her understanding and acceptance.       Plan Discussed with:    Anesthesia Plan Comments:        Anesthesia Quick Evaluation

## 2021-06-04 NOTE — Progress Notes (Addendum)
[facility] Consult Note Telephone: [phone]  Fax: [phone]  PATIENT NAME: [name] [place] [address] [phone] (home)  DOB: [birth year] MRN: [id number]  PRIMARY CARE PROVIDER:    [name], Green,  [place] [place] [address] [phone]  REFERRING PROVIDER:   [name], Green [address] [place],  [place] [address] [phone]  RESPONSIBLE PARTYHoyle Green [phone] [name] is daughter Contact Information     Name Relation Home Work Mobile   Charley,Carolyn Spouse [phone]  [phone]   Lenoria Chime Daughter   [phone]        I met face to face with patient and family at home. Palliative Care was asked to follow this patient by consultation request of  Mike Arabian, Green to address advance care planning, complex medical decision making and goals of care clarification. [name] and [name] are present during visit. This is the initial visit.    ASSESSMENT AND / RECOMMENDATIONS:   Advance Care Planning: Our advance care planning conversation included a discussion about:    The value and importance of advance care planning  Difference between Hospice and Palliative care Exploration of goals of care in the event of a sudden injury or illness  Identification and preparation of a healthcare agent  Review and updating or creation of an  advance directive document . Decision not to resuscitate or to de-escalate disease focused treatments due to poor prognosis.  CODE STATUS: Discussion and ramifications of code status. Spouse affirmed patient is a Do Not Resuscitate. NP signed DNR for patient; same document uploaded to Epic  Goals of Care: Goals include to maximize quality of life and symptom management  I spent 46 minutes providing this initial consultation. More than 50% of the time in this consultation was spent on counseling patient and  coordinating communication. -------------------------------------------------------------------------------------------------------------------------------------- Symptom Management/Plan: Panic attacks: Currently managed with Paxil and Lorazepam Followed by Psych, next appointment tomorrow 06/05/21.  Discussed Calming/de-escalation techniques, music therapy, breathing techniques. Dysphagia: narrowing in esophagus, throat stretching procedure (esophageal dilation) tomorrow. Mechanical soft diet discussed. Aspiration  precautions discussed. ST consult recommended.  Gait disturbance: Recent fall without injuries. Patient is a high fall risk. Recommendation: PT for strengthening and gait training. Routine CBC CMP. Appt with PCP 06/08/2021.  Paroxymal Afib: On Eliquis. Pacemaker present. Followed by Cardiologist.  Follow up: Palliative care will continue to follow for complex medical decision making, advance care planning, and clarification of goals. Return 6 weeks or prn. Encouraged to call provider sooner with any concerns.   Family /Caregiver/Community Supports: Patient lives at home with his wife. Grown children [name] and [name] are involved in his care. Strong family support system identified.   HOSPICE ELIGIBILITY/DIAGNOSIS: TBD  Chief Complaint: Initial Palliative care visit  HISTORY OF PRESENT ILLNESS:  [name] is a 81 y.o. year old male  with multiple medical conditions including panic attacks which which has worsened in past in the past 6 months, last attack was yesterday, per spouse Panic attacks started in 1991 . History of Complete heart block, gait disturbance, Fall, Afib, cognitive deficits. Patient denies pain/discomfort, endorses anxiety; he was receptive of non pharmacological interventions .  History obtained from review of EMR, discussion with primary team, caregiver, family and/or Mr. [name].  Review and summarization of Epic records shows history from other than patient. Rest of 10 point  ROS asked and negative.  I reviewed as needed, available  labs, patient records, imaging, studies and related documents from the EMR.  ROS General: NAD EYES: denies vision changes ENMT: endorses occasional dysphagia Cardiovascular: denies chest pain/discomfort Pulmonary: denies cough, denies SOB Abdomen: endorses good appetite, denies constipation/diarrhea GU: denies dysuria, urinary frequency MSK:  endorses weakness,  no falls reported Skin: denies rashes or wounds Neurological: denies pain, denies insomnia Psych: reports ongoing intermittent anxiety Heme/lymph/immuno: denies bruises, abnormal bleeding  Physical Exam: Height/Weight: 6 feet/318 Ibs down from 352 Ibs 4 months.  Constitutional: NAD General: Well groomed, cooperative EYES: anicteric sclera, lids intact, no discharge  ENMT: Moist mucous membrane CV: S1 S2, RRR, no LE edema Pulmonary: LCTA, no increased work of breathing, no cough, Abdomen: active BS + 4 quadrants, soft and non tender GU: no suprapubic tenderness MSK: weakness, unsteady gait, ambulatory with rolling walker, limited ROM, occasional tremor to right hand - chronic Skin: warm and dry, no rashes or wounds on visible skin Neuro:  weakness, otherwise non focal Psych: coping Hem/lymph/immuno: no widespread bruising   PAST MEDICAL HISTORY:  Active Ambulatory Problems    Diagnosis Date Noted   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   Paroxysmal atrial fibrillation ([place]) 06/24/2019   Secondary hypercoagulable state ([place]) 06/24/2019   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/2020   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   Resolved Ambulatory Problems    Diagnosis Date Noted   No Resolved Ambulatory Problems   Past Medical History:  Diagnosis Date   CKD (chronic kidney disease)    DM type 2 (diabetes mellitus, type 2) (HCC)    ED (erectile dysfunction)    Fatty liver    GERD (gastroesophageal reflux disease)    Gout    H/O adenomatous polyp of colon    HLD  (hyperlipidemia)    HTN (hypertension)    Hypothyroid    Panic attacks    Psoriasis     SOCIAL HX:  Social History   Tobacco Use   Smoking status: Never   Smokeless tobacco: Never  Substance Use Topics   Alcohol use: No    Alcohol/week: 0.0 standard drinks     FAMILY HX:  Father: Emphysema Sister: Lung CA  ALLERGIES:  Allergies  Allergen Reactions   Hctz [Hydrochlorothiazide] Other (See Comments)    Gout flare    Lexapro [Escitalopram Oxalate]     Ineffective for anxiety    Lisinopril     angioedema    Silodosin Diarrhea   Tamsulosin Other (See Comments)    visual changes      PERTINENT MEDICATIONS:  Outpatient Encounter Medications as of 06/04/2021  Medication Sig   ACCU-CHEK SMARTVIEW test strip    apixaban (ELIQUIS) 5 MG TABS tablet Take 1 tablet (5 mg total) by mouth 2 (two) times daily.   Dextromethorphan-guaiFENesin (MUCINEX DM MAXIMUM STRENGTH PO) Take 20 mLs by mouth every 4 (four) hours as needed (congestion.).   levothyroxine (SYNTHROID) 137 MCG tablet Take 137 mcg by mouth daily before breakfast.   LORazepam (ATIVAN) 1 MG tablet Take 0.5-1 mg by mouth See admin instructions. Take 0.5-1 tablet (0.5-1 mg) by mouth in the morning, take 0.5 tablet (0.5 mg) by mouth in the afternoon & take 0.5 tablet (0.5 mg) by mouth in the evening.   losartan (COZAAR) 50 MG tablet Take 1 tablet (50 mg total) by mouth daily. (Patient taking differently: Take 50 mg by mouth every evening.)   melatonin 5 MG TABS Take 5 mg by mouth at bedtime.   PARoxetine (PAXIL) 20 MG tablet Take  10 mg by mouth in the morning.   PARoxetine (PAXIL) 40 MG tablet Take 40 mg by mouth daily.   RESTASIS 0.05 % ophthalmic emulsion Place 1 drop into both eyes 2 (two) times daily as needed (dry eyes).   rosuvastatin (CRESTOR) 10 MG tablet Take 1 tablet (10 mg total) by mouth daily.   tiaGABine (GABITRIL) 4 MG tablet Take 10 mg by mouth in the morning and at bedtime.   torsemide (DEMADEX) 20 MG tablet  Take 1 tablet (20 mg total) by mouth as needed (swelling). (Patient taking differently: Take 20 mg by mouth daily as needed (swelling).)   traZODone (DESYREL) 50 MG tablet Take 50 mg by mouth at bedtime.   No facility-administered encounter medications on file as of 06/04/2021.     Thank you for the opportunity to participate in the care of Mr. Malizia.  The palliative care team will continue to follow. Please call our office at [phone] if we can be of additional assistance.   Note: Portions of this note were generated with Lobbyist. Dictation errors may occur despite best attempts at proofreading.  Teodoro Spray, NP

## 2021-06-05 ENCOUNTER — Ambulatory Visit (HOSPITAL_COMMUNITY): Payer: Medicare HMO | Admitting: Anesthesiology

## 2021-06-05 ENCOUNTER — Encounter (HOSPITAL_COMMUNITY): Payer: Self-pay | Admitting: Gastroenterology

## 2021-06-05 ENCOUNTER — Encounter (HOSPITAL_COMMUNITY)
Admission: RE | Disposition: A | Discharge status: Home / Self Care | Payer: Self-pay | Source: Home / Self Care | Attending: Gastroenterology

## 2021-06-05 ENCOUNTER — Ambulatory Visit (HOSPITAL_COMMUNITY)
Admission: RE | Admit: 2021-06-05 | Discharge: 2021-06-05 | Disposition: A | Discharge status: Home / Self Care | Payer: Medicare HMO | Attending: Gastroenterology | Admitting: Gastroenterology

## 2021-06-05 DIAGNOSIS — E785 Hyperlipidemia, unspecified: Secondary | ICD-10-CM | POA: Diagnosis not present

## 2021-06-05 DIAGNOSIS — K2940 Chronic atrophic gastritis without bleeding: Secondary | ICD-10-CM | POA: Diagnosis not present

## 2021-06-05 DIAGNOSIS — F419 Anxiety disorder, unspecified: Secondary | ICD-10-CM | POA: Diagnosis not present

## 2021-06-05 DIAGNOSIS — R933 Abnormal findings on diagnostic imaging of other parts of digestive tract: Secondary | ICD-10-CM | POA: Diagnosis not present

## 2021-06-05 DIAGNOSIS — F39 Unspecified mood [affective] disorder: Secondary | ICD-10-CM | POA: Diagnosis not present

## 2021-06-05 DIAGNOSIS — K269 Duodenal ulcer, unspecified as acute or chronic, without hemorrhage or perforation: Secondary | ICD-10-CM | POA: Diagnosis not present

## 2021-06-05 DIAGNOSIS — K449 Diaphragmatic hernia without obstruction or gangrene: Secondary | ICD-10-CM | POA: Diagnosis not present

## 2021-06-05 DIAGNOSIS — R1310 Dysphagia, unspecified: Secondary | ICD-10-CM | POA: Insufficient documentation

## 2021-06-05 HISTORY — PX: ESOPHAGOGASTRODUODENOSCOPY (EGD) WITH PROPOFOL: SHX5813

## 2021-06-05 HISTORY — PX: BOTOX INJECTION: SHX5754

## 2021-06-05 LAB — GLUCOSE, CAPILLARY: Glucose-Capillary: 99 mg/dL (ref 70–99)

## 2021-06-05 SURGERY — ESOPHAGOGASTRODUODENOSCOPY (EGD) WITH PROPOFOL
Anesthesia: Monitor Anesthesia Care

## 2021-06-05 MED ORDER — PROPOFOL 500 MG/50ML IV EMUL
INTRAVENOUS | Status: DC | PRN
  Administered 2021-06-05: 125 ug/kg/min via INTRAVENOUS

## 2021-06-05 MED ORDER — LACTATED RINGERS IV SOLN: INTRAVENOUS | Status: DC

## 2021-06-05 MED ORDER — PROPOFOL 10 MG/ML IV BOLUS
INTRAVENOUS | Status: AC
  Filled 2021-06-05: qty 20

## 2021-06-05 MED ORDER — ONABOTULINUMTOXINA 100 UNITS IJ SOLR
INTRAMUSCULAR | Status: AC
  Filled 2021-06-05: qty 100

## 2021-06-05 MED ORDER — SODIUM CHLORIDE (PF) 0.9 % IJ SOLN
INTRAMUSCULAR | Status: DC | PRN
  Administered 2021-06-05: 4 mL via SUBMUCOSAL

## 2021-06-05 MED ORDER — SODIUM CHLORIDE 0.9 % IV SOLN: INTRAVENOUS | Status: DC

## 2021-06-05 MED ORDER — SODIUM CHLORIDE (PF) 0.9 % IJ SOLN
INTRAMUSCULAR | Status: AC
  Filled 2021-06-05: qty 10

## 2021-06-05 MED ORDER — PHENYLEPHRINE 40 MCG/ML (10ML) SYRINGE FOR IV PUSH (FOR BLOOD PRESSURE SUPPORT)
PREFILLED_SYRINGE | INTRAVENOUS | Status: DC | PRN
  Administered 2021-06-05 (×2): 40 ug via INTRAVENOUS
  Administered 2021-06-05: 80 ug via INTRAVENOUS

## 2021-06-05 MED ORDER — PROPOFOL 10 MG/ML IV BOLUS
INTRAVENOUS | Status: DC | PRN
  Administered 2021-06-05: 20 mg via INTRAVENOUS
  Administered 2021-06-05: 10 mg via INTRAVENOUS

## 2021-06-05 MED ORDER — PROPOFOL 1000 MG/100ML IV EMUL
INTRAVENOUS | Status: AC
  Filled 2021-06-05: qty 100

## 2021-06-05 SURGICAL SUPPLY — 15 items

## 2021-06-05 NOTE — Anesthesia Postprocedure Evaluation (Signed)
Anesthesia Post Note  Patient: [name]  Procedure(s) Performed: ESOPHAGOGASTRODUODENOSCOPY (EGD) WITH PROPOFOL BOTOX INJECTION     Patient location during evaluation: Endoscopy Anesthesia Type: MAC Level of consciousness: awake and alert Pain management: pain level controlled Vital Signs Assessment: post-procedure vital signs reviewed and stable Respiratory status: spontaneous breathing, nonlabored ventilation and respiratory function stable Cardiovascular status: blood pressure returned to baseline and stable Postop Assessment: no apparent nausea or vomiting Anesthetic complications: no   No notable events documented.  Last Vitals:  Vitals:   06/05/21 0910 06/05/21 0920  BP: 103/70 (!) 151/71  Pulse: 61 78  Resp: 17 17  Temp:    SpO2: 100% 99%    Last Pain:  Vitals:   06/05/21 0920  TempSrc:   PainSc: 0-No pain                 [name]

## 2021-06-05 NOTE — Anesthesia Procedure Notes (Signed)
Procedure Name: MAC Date/Time: 06/05/2021 8:43 AM Performed by: Niel Hummer, CRNA Pre-anesthesia Checklist: Patient identified, Emergency Drugs available, Suction available and Patient being monitored Oxygen Delivery Method: Simple face mask

## 2021-06-05 NOTE — Progress Notes (Signed)
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

## 2021-06-05 NOTE — Transfer of Care (Signed)
Immediate Anesthesia Transfer of Care Note  Patient: [name]  Procedure(s) Performed: ESOPHAGOGASTRODUODENOSCOPY (EGD) WITH PROPOFOL BOTOX INJECTION  Patient Location: PACU  Anesthesia Type:MAC  Level of Consciousness: drowsy  Airway & Oxygen Therapy: Patient Spontanous Breathing and Patient connected to face mask oxygen  Post-op Assessment: Report given to RN, Post -op Vital signs reviewed and stable and Patient moving all extremities X 4  Post vital signs: Reviewed and stable  Last Vitals:  Vitals Value Taken Time  BP 84/59   Temp    Pulse 69 06/05/21 0906  Resp 17 06/05/21 0906  SpO2 100 % 06/05/21 0906  Vitals shown include unvalidated device data.  Last Pain:  Vitals:   06/05/21 0752  TempSrc: Axillary  PainSc: 0-No pain         Complications: No notable events documented.

## 2021-06-05 NOTE — Discharge Instructions (Addendum)
YOU HAD AN ENDOSCOPIC PROCEDURE TODAY: Refer to the procedure report and other information in the discharge instructions given to you for any specific questions about what was found during the examination. If this information does not answer your questions, please call [facility] office at [phone] to clarify.   YOU SHOULD EXPECT: Some feelings of bloating in the abdomen. Passage of more gas than usual. Walking can help get rid of the air that was put into your GI tract during the procedure and reduce the bloating. If you had a lower endoscopy (such as a colonoscopy or flexible sigmoidoscopy) you may notice spotting of blood in your stool or on the toilet paper. Some abdominal soreness may be present for a day or two, also.  DIET: Your first meal following the procedure should be a light meal and then it is ok to progress to your normal diet. A half-sandwich or bowl of soup is an example of a good first meal. Heavy or fried foods are harder to digest and may make you feel nauseous or bloated. Drink plenty of fluids but you should avoid alcoholic beverages for 24 hours. If you had a esophageal dilation, please see attached instructions for diet.   ACTIVITY: Your care partner should take you home directly after the procedure. You should plan to take it easy, moving slowly for the rest of the day. You can resume normal activity the day after the procedure however YOU SHOULD NOT DRIVE, use power tools, machinery or perform tasks that involve climbing or major physical exertion for 24 hours (because of the sedation medicines used during the test).   SYMPTOMS TO REPORT IMMEDIATELY: A gastroenterologist can be reached at any hour. Please call [phone]  for any of the following symptoms:  Following upper endoscopy (EGD, EUS, ERCP, esophageal dilation) Vomiting of blood or coffee ground material  New, significant abdominal pain  New, significant chest pain or pain under the shoulder blades  Painful or  persistently difficult swallowing  New shortness of breath  Black, tarry-looking or red, bloody stools if doing well tomorrow may resume blood thinner i.e. Eliquis and my nurse will call in a stomach medicine either pantoprazole or omeprazole once a day and call me if question or problem from a GI standpoint otherwise follow-up in 1 month  FOLLOW UP:  If any biopsies were taken you will be contacted by phone or by letter within the next 1-3 weeks. Call [phone]  if you have not heard about the biopsies in 3 weeks.  Please also call with any specific questions about appointments or follow up tests.

## 2021-06-05 NOTE — Op Note (Signed)
[facility] Patient Name: [name] Procedure Date: 06/05/2021 MRN: [id number] Attending MD: Clarene Essex , MD Date of Birth: 10-28-1940 CSN: [id number] Age: 81 Admit Type: Outpatient Procedure:                Upper GI endoscopy Indications:              Dysphagia, Abnormal UGI series Providers:                Clarene Essex, MD, [place] Person, [facility]                            Technician, [employer] Referring MD:              Medicines:                Monitored Anesthesia Care see anesthesia note Complications:            No immediate complications. Estimated Blood Loss:     Estimated blood loss: none. Procedure:                Pre-Anesthesia Assessment:                           - Prior to the procedure, a History and Physical                            was performed, and patient medications and                            allergies were reviewed. The patient's tolerance of                            previous anesthesia was also reviewed. The risks                            and benefits of the procedure and the sedation                            options and risks were discussed with the patient.                            All questions were answered, and informed consent                            was obtained. Prior Anticoagulants: The patient has                            taken Eliquis (apixaban), last dose was 3 days                            prior to procedure. ASA Grade Assessment: III - A                            patient with severe systemic disease. After  reviewing the risks and benefits, the patient was                            deemed in satisfactory condition to undergo the                            procedure.                           After obtaining informed consent, the endoscope was                            passed under direct vision. Throughout the                            procedure, the patient's blood pressure,  pulse, and                            oxygen saturations were monitored continuously. The                            EGD-OR was introduced through the mouth, and                            advanced to the third part of duodenum. The upper                            GI endoscopy was accomplished without difficulty.                            The patient tolerated the procedure well. Scope In: Scope Out: Findings:      A ?tiny hiatal hernia was present.      A hypertonic lower esophageal sphincter was found. There was no       resistance to endoscope advancement into the stomach. The Z-line was       regular. The gastroesophageal junction and cardia were normal on       retroflexed view. Area was successfully injected with 100 units       botulinum toxin. 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      Few non-bleeding superficial duodenal ulcers were found in the duodenal       bulb.      The ampulla, second portion of the duodenum, major papilla, area of the       papilla and third portion of the duodenum were normal.      The exam was otherwise without abnormality. Impression:               - ?tiny hiatal hernia.                           - The examination was suspicious for achalasia.  Injected with botulinum toxin. 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                           - Non-bleeding duodenal ulcers.                           - Normal ampulla, second portion of the duodenum,                            major papilla, area of the papilla and third                            portion of the duodenum.                           - The examination was otherwise normal.                            - No specimens collected. Moderate Sedation:      Not Applicable - Patient had care per Anesthesia. Recommendation:           - Soft diet today.                           - Use Protonix (pantoprazole) 40 mg PO daily                            indefinitely.                           - Resume Eliquis (apixaban) at prior dose tomorrow                            if no signs of GI bleeding. Procedure Code(s):        --- Professional ---                           [phone], Esophagogastroduodenoscopy, flexible,                            transoral; with directed submucosal injection(s),                            any substance Diagnosis Code(s):        --- Professional ---                           K44.9, Diaphragmatic hernia without obstruction or                            gangrene  K29.40, Chronic atrophic gastritis without bleeding                           K26.9, Duodenal ulcer, unspecified as acute or                            chronic, without hemorrhage or perforation                           R13.10, Dysphagia, unspecified                           R93.3, Abnormal findings on diagnostic imaging of                            other parts of digestive tract CPT copyright 2019 American Medical Association. All rights reserved. The codes documented in this report are preliminary and upon coder review may  be revised to meet current compliance requirements. Clarene Essex, MD 06/05/2021 9:16:23 AM This report has been signed electronically. Number of Addenda: 0

## 2021-06-06 ENCOUNTER — Encounter: Payer: Self-pay | Admitting: Cardiovascular Disease

## 2021-06-07 DIAGNOSIS — R413 Other amnesia: Secondary | ICD-10-CM | POA: Diagnosis not present

## 2021-06-07 DIAGNOSIS — R251 Tremor, unspecified: Secondary | ICD-10-CM | POA: Diagnosis not present

## 2021-06-07 DIAGNOSIS — N390 Urinary tract infection, site not specified: Secondary | ICD-10-CM | POA: Diagnosis not present

## 2021-06-07 DIAGNOSIS — R269 Unspecified abnormalities of gait and mobility: Secondary | ICD-10-CM | POA: Diagnosis not present

## 2021-06-09 DIAGNOSIS — R413 Other amnesia: Secondary | ICD-10-CM | POA: Diagnosis not present

## 2021-06-09 DIAGNOSIS — R251 Tremor, unspecified: Secondary | ICD-10-CM | POA: Diagnosis not present

## 2021-06-09 DIAGNOSIS — N183 Chronic kidney disease, stage 3 unspecified: Secondary | ICD-10-CM | POA: Diagnosis not present

## 2021-06-09 DIAGNOSIS — N390 Urinary tract infection, site not specified: Secondary | ICD-10-CM | POA: Diagnosis not present

## 2021-06-09 DIAGNOSIS — I129 Hypertensive chronic kidney disease with stage 1 through stage 4 chronic kidney disease, or unspecified chronic kidney disease: Secondary | ICD-10-CM | POA: Diagnosis not present

## 2021-06-09 DIAGNOSIS — I4891 Unspecified atrial fibrillation: Secondary | ICD-10-CM | POA: Diagnosis not present

## 2021-06-09 DIAGNOSIS — N400 Enlarged prostate without lower urinary tract symptoms: Secondary | ICD-10-CM | POA: Diagnosis not present

## 2021-06-09 DIAGNOSIS — I442 Atrioventricular block, complete: Secondary | ICD-10-CM | POA: Diagnosis not present

## 2021-06-09 DIAGNOSIS — E1122 Type 2 diabetes mellitus with diabetic chronic kidney disease: Secondary | ICD-10-CM | POA: Diagnosis not present

## 2021-06-12 NOTE — Progress Notes (Signed)
Assessment/Plan:   1.  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  Patient certainly has memory change with paranoia.  -Discussed whether or not to try levodopa, and ultimately they thought that it was a good idea.  We discussed risk, benefits, and side effects.  Discussed risk of hallucinations, although he really has not none of that.  We will start with only half tablet 3 times per day and slowly work up to 1 tablet 3 times per day.  They will let me know if he has any side effects with the medication.  -Daughter asked about neurocognitive testing, but I do not think that the patient is going to be able to tolerate that.  He does not disagree.  -I really would like to get some neuroimaging, but the patient declines.  He is very anxious and states that he cannot lay down, either for CAT scan or MRI.  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  I do not think that it would be a good idea to sedate him.  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  Patient is seeing psychiatry.  He is on Gabitril.  This is not for seizure disorder.  Subjective:   [name] was seen today in the movement disorders clinic for neurologic consultation at the request of [name], MD.  The consultation is for the evaluation of tremor and memory change.  Medical records made available to me have been reviewed.  Daughter with patient and supplements the history.  According to referring physician notes, the patient has a "longstanding history of body shaking and malaise."  States that patient continues to have frequent shaking spells and family wanted to know if this was essential tremor or early signs of Parkinson's disease.  Notes from palliative care indicate that patient  had right hand tremor.  His levothyroxine dose has been adjusted because of tremor.  Notes also indicate that patient has been seeing psychiatry and they have been trying to wean him off of the lorazepam.  Patient referred for further evaluation.  Pt states that on gabatril for "shaking."  On it for 3-4 months.  Psychiatry RX that.  Dr. Omer Jack is psychiatrist.   Tremor: Yes.     How long has it been going on?  2 to 3 years  At rest or with activation?  both  Fam hx of tremor?  No.  Located where?  R arm>L arm.  Daughter states that R one started 2-3 years ago but now L one has started.  Affected by caffeine:  doesn't change it  Affected by alcohol:  doesn't change it  Affected by stress:  increases it  Spills soup if on spoon:  Yes.    Spills glass of liquid if full:  Yes.    Affects ADL's (tying shoes, brushing teeth, etc):  Yes.    Tremor inducing meds:  No.  Other Specific Symptoms:  Voice: no change Sleep: trouble sleeping due to anxiety  Vivid Dreams:  No.  Acting out dreams:  some sleep talk Postural symptoms:  Yes.    Falls?  No. But multiple near falls - walks with walker Bradykinesia symptoms: shuffling gait, slow movements, and difficulty getting out of a chair Loss of  smell:  Yes.   Loss of taste:  Yes.   Urinary Incontinence:  No. Difficulty Swallowing:  Yes.  , just had esophagus stretched last week Anxiety:  Yes.   Records from many years (at least back to 2013/2014) indicate that the patient has had longstanding panic attacks Memory changes:  Yes.  , with paranoia Hallucinations:  No.  visual distortions: No. N/V:  No. Lightheaded:  Yes.    Syncope: No. Diplopia:  No. Dyskinesia:  No.  Neuroimaging of the brain has not previously been performed.    ALLERGIES:   Allergies  Allergen Reactions   Hctz [Hydrochlorothiazide] Other (See Comments)    Gout flare    Lexapro [Escitalopram Oxalate]     Ineffective for anxiety    Lisinopril     angioedema     Silodosin Diarrhea   Tamsulosin Other (See Comments)    visual changes    CURRENT MEDICATIONS:  Current Outpatient Medications  Medication Instructions   ACCU-CHEK SMARTVIEW test strip No dose, route, or frequency recorded.   apixaban (ELIQUIS) 5 mg, Oral, 2 times daily   levothyroxine (SYNTHROID) 137 mcg, Oral, Daily before breakfast   LORazepam (ATIVAN) 0.5-1 mg, Oral, See admin instructions, Take 0.5-1 tablet (0.5-1 mg) by mouth in the morning, take 0.5 tablet (0.5 mg) by mouth in the afternoon & take 0.5 tablet (0.5 mg) by mouth in the evening.   losartan (COZAAR) 50 mg, Oral, Daily   melatonin 5 mg, Oral, Daily at bedtime   pantoprazole (PROTONIX) 40 mg, Oral, Daily   PARoxetine (PAXIL) 40 mg, Oral, Daily   RESTASIS 0.05 % ophthalmic emulsion 1 drop, Both Eyes, 2 times daily PRN   rosuvastatin (CRESTOR) 10 mg, Oral, Daily   tiaGABine (GABITRIL) 10 mg, Oral, 2 times daily, 3 tablets BID   torsemide (DEMADEX) 20 mg, Oral, As needed   traZODone (DESYREL) 50 mg, Oral, Daily at bedtime    Objective:   PHYSICAL EXAMINATION:    VITALS:   Vitals:   06/14/21 1342  BP: 134/64  Pulse: (!) 58  SpO2: 97%  Weight: 299 lb (135.6 kg)  Height: 6' (1.829 m)    GEN:  The patient appears stated age.  Pt is nervous.  He moans (oooh) during the visit but is better the longer I speak with him.  Carries a cooling fan during the visit HEENT:  Normocephalic, atraumatic.  The mucous membranes are moist. The superficial temporal arteries are without ropiness or tenderness. CV:  brady.  regular Lungs:  CTAB Neck/HEME:  There are no carotid bruits bilaterally.  Neurological examination:  Orientation: The patient is alert and oriented x3.  Cranial nerves: There is good facial symmetry.  There is facial hypomimia with lips parted.  Extraocular muscles are intact. The visual fields are full to confrontational testing. The speech is fluent and clear. Soft palate rises symmetrically and there is no  tongue deviation. Hearing is intact to conversational tone. Sensation: Sensation is intact to light touch throughout (facial, trunk, extremities). Vibration is decreased distally. There is no extinction with double simultaneous stimulation.  Motor: Strength is 5/5 in the bilateral upper and lower extremities.   Shoulder shrug is equal and symmetric.  There is no pronator drift. Deep tendon reflexes: Deep tendon reflexes are 2/4 at the bilateral biceps, triceps, brachioradialis, patella and achilles. Plantar responses are upgoing bilaterally (bilateral striatal toes)  Movement examination: Tone: There is mod to severe increased tone in the RUE/RLE Abnormal movements: there is irreg tremor in the  R>LUE Coordination:  There is decremation with RAM's, right greater than left Gait and Station: The patient requires 2 person assistance out of the transport chair (part of this is because of the transport chair leaning backward).  Once out of the chair, he is given a walker.  He walks slow and gait is short stepped.  He turns slowly.   I have reviewed and interpreted the following labs independently   Chemistry      Component Value Date/Time   NA 134 05/25/2021 1412   K 4.6 05/25/2021 1412   CL 96 05/25/2021 1412   CO2 23 05/25/2021 1412   BUN 17 05/25/2021 1412   CREATININE 1.64 (H) 05/25/2021 1412      Component Value Date/Time   CALCIUM 10.1 05/25/2021 1412   ALKPHOS 78 05/25/2021 1412   AST 18 05/25/2021 1412   ALT 11 05/25/2021 1412   BILITOT 1.3 (H) 05/25/2021 1412      Lab Results  Component Value Date   TSH 0.032 (L) 04/10/2021  TSH repeated in December 72, 4952 81 year old 0.34. Lab Results  Component Value Date   WBC 6.3 04/15/2021   HGB 14.1 04/15/2021   HCT 45.1 04/15/2021   MCV 95.6 04/15/2021   PLT 178 04/15/2021      Total time spent on today's visit was 60 minutes, including both face-to-face time and nonface-to-face time.  Time included that spent on review of  records (prior notes available to me/labs/imaging if pertinent), discussing treatment and goals, answering patient's questions and coordinating care.  Cc:  [name], MD

## 2021-06-13 DIAGNOSIS — M25511 Pain in right shoulder: Secondary | ICD-10-CM | POA: Diagnosis not present

## 2021-06-13 DIAGNOSIS — M25562 Pain in left knee: Secondary | ICD-10-CM | POA: Diagnosis not present

## 2021-06-13 DIAGNOSIS — M25561 Pain in right knee: Secondary | ICD-10-CM | POA: Diagnosis not present

## 2021-06-14 ENCOUNTER — Other Ambulatory Visit: Payer: Self-pay

## 2021-06-14 ENCOUNTER — Ambulatory Visit: Payer: Medicare HMO | Admitting: Neurology

## 2021-06-14 ENCOUNTER — Encounter: Payer: Self-pay | Admitting: Neurology

## 2021-06-14 VITALS — BP 134/64 | HR 58 | Ht 72.0 in | Wt 299.0 lb

## 2021-06-14 DIAGNOSIS — G20 Parkinson's disease: Secondary | ICD-10-CM

## 2021-06-14 DIAGNOSIS — F411 Generalized anxiety disorder: Secondary | ICD-10-CM | POA: Diagnosis not present

## 2021-06-14 DIAGNOSIS — F410 Panic disorder [episodic paroxysmal anxiety] without agoraphobia: Secondary | ICD-10-CM

## 2021-06-14 MED ORDER — CARBIDOPA-LEVODOPA 25-100 MG PO TABS: 1.0000 | ORAL_TABLET | Freq: Three times a day (TID) | ORAL | 1 refills | Status: DC

## 2021-06-14 NOTE — Patient Instructions (Signed)
Start Carbidopa Levodopa as follows: Take 1/2 tablet three times daily, at least 30 minutes before meals (approximately 7am/11am/4pm), for one week Then take 1/2 tablet in the morning, 1/2 tablet in the afternoon, 1 tablet in the evening, at least 30 minutes before meals, for one week Then take 1/2 tablet in the morning, 1 tablet in the afternoon, 1 tablet in the evening, at least 30 minutes before meals, for one week Then take 1 tablet three times daily at 7am/11am/4pm, at least 30 minutes before meals   As a reminder, carbidopa/levodopa can be taken at the same time as a carbohydrate, but we like to have you take your pill either 30 minutes before a protein source or 1 hour after as protein can interfere with carbidopa/levodopa absorption.  

## 2021-06-25 ENCOUNTER — Telehealth: Payer: Self-pay | Admitting: Neurology

## 2021-06-25 NOTE — Telephone Encounter (Signed)
Spoke to [place] at [place] and let her know I spoke to patient and they are D/C the carbidopa Levodopa. Patient has appointment with PCP scheduled for Wednesday.

## 2021-06-25 NOTE — Telephone Encounter (Signed)
Robin from [facility] called in to report the patient fell yesterday. EMS was called to assist him getting off the floor. He bruised both knees, a skin tear on his right arm that they bandaged, and bruising on the right upper extremity.

## 2021-06-25 NOTE — Telephone Encounter (Signed)
Patient's daughter Lynelle Smoke called to report significant changes with the patient over the last week, before that he was doing okay, she said.  Tammy further explained for the last three day the patient cannot walk and fell yesterday. They had to call the fire department to get him up. Also, his right hand is curled up.  She'd like to know what to do to help him.

## 2021-06-25 NOTE — Telephone Encounter (Signed)
Patient went to PCP on the 10th and was screened at that time for urinary infection. Psychologist, occupational and ambulance looked patient over for stroke or heart attack and deemed patient ok. I told daughter to D/C carbidopa levodopa and that I would check on them in two weeks unless anything else happens and they will call our office and let us know

## 2021-06-27 ENCOUNTER — Emergency Department (HOSPITAL_COMMUNITY): Payer: Medicare HMO

## 2021-06-27 ENCOUNTER — Inpatient Hospital Stay (HOSPITAL_COMMUNITY)
Admission: EM | Admit: 2021-06-27 | Discharge: 2021-07-04 | DRG: 871 | Disposition: A | Discharge status: Skilled Nursing Facility | Payer: Medicare HMO | Attending: Internal Medicine | Admitting: Internal Medicine

## 2021-06-27 ENCOUNTER — Other Ambulatory Visit: Payer: Self-pay

## 2021-06-27 ENCOUNTER — Other Ambulatory Visit: Payer: Self-pay | Admitting: Hospice

## 2021-06-27 ENCOUNTER — Encounter (HOSPITAL_COMMUNITY): Payer: Self-pay

## 2021-06-27 DIAGNOSIS — Z66 Do not resuscitate: Secondary | ICD-10-CM | POA: Diagnosis present

## 2021-06-27 DIAGNOSIS — Z7901 Long term (current) use of anticoagulants: Secondary | ICD-10-CM

## 2021-06-27 DIAGNOSIS — Z6841 Body Mass Index (BMI) 40.0 and over, adult: Secondary | ICD-10-CM | POA: Diagnosis not present

## 2021-06-27 DIAGNOSIS — G9341 Metabolic encephalopathy: Secondary | ICD-10-CM | POA: Diagnosis present

## 2021-06-27 DIAGNOSIS — I10 Essential (primary) hypertension: Secondary | ICD-10-CM | POA: Diagnosis present

## 2021-06-27 DIAGNOSIS — R6520 Severe sepsis without septic shock: Secondary | ICD-10-CM | POA: Diagnosis not present

## 2021-06-27 DIAGNOSIS — G20 Parkinson's disease: Secondary | ICD-10-CM | POA: Diagnosis not present

## 2021-06-27 DIAGNOSIS — I442 Atrioventricular block, complete: Secondary | ICD-10-CM | POA: Diagnosis not present

## 2021-06-27 DIAGNOSIS — R410 Disorientation, unspecified: Secondary | ICD-10-CM

## 2021-06-27 DIAGNOSIS — Z950 Presence of cardiac pacemaker: Secondary | ICD-10-CM

## 2021-06-27 DIAGNOSIS — R4781 Slurred speech: Secondary | ICD-10-CM | POA: Diagnosis not present

## 2021-06-27 DIAGNOSIS — N179 Acute kidney failure, unspecified: Secondary | ICD-10-CM | POA: Diagnosis present

## 2021-06-27 DIAGNOSIS — R4182 Altered mental status, unspecified: Secondary | ICD-10-CM

## 2021-06-27 DIAGNOSIS — K219 Gastro-esophageal reflux disease without esophagitis: Secondary | ICD-10-CM | POA: Diagnosis present

## 2021-06-27 DIAGNOSIS — I5022 Chronic systolic (congestive) heart failure: Secondary | ICD-10-CM | POA: Diagnosis present

## 2021-06-27 DIAGNOSIS — G4733 Obstructive sleep apnea (adult) (pediatric): Secondary | ICD-10-CM | POA: Diagnosis present

## 2021-06-27 DIAGNOSIS — R54 Age-related physical debility: Secondary | ICD-10-CM | POA: Diagnosis present

## 2021-06-27 DIAGNOSIS — E860 Dehydration: Secondary | ICD-10-CM | POA: Diagnosis present

## 2021-06-27 DIAGNOSIS — A419 Sepsis, unspecified organism: Secondary | ICD-10-CM | POA: Diagnosis not present

## 2021-06-27 DIAGNOSIS — G20A1 Parkinson's disease without dyskinesia, without mention of fluctuations: Secondary | ICD-10-CM | POA: Diagnosis present

## 2021-06-27 DIAGNOSIS — F410 Panic disorder [episodic paroxysmal anxiety] without agoraphobia: Secondary | ICD-10-CM

## 2021-06-27 DIAGNOSIS — R269 Unspecified abnormalities of gait and mobility: Secondary | ICD-10-CM

## 2021-06-27 DIAGNOSIS — F419 Anxiety disorder, unspecified: Secondary | ICD-10-CM | POA: Diagnosis present

## 2021-06-27 DIAGNOSIS — E785 Hyperlipidemia, unspecified: Secondary | ICD-10-CM | POA: Diagnosis present

## 2021-06-27 DIAGNOSIS — E1122 Type 2 diabetes mellitus with diabetic chronic kidney disease: Secondary | ICD-10-CM | POA: Diagnosis present

## 2021-06-27 DIAGNOSIS — I495 Sick sinus syndrome: Secondary | ICD-10-CM | POA: Diagnosis present

## 2021-06-27 DIAGNOSIS — R0602 Shortness of breath: Secondary | ICD-10-CM

## 2021-06-27 DIAGNOSIS — Z515 Encounter for palliative care: Secondary | ICD-10-CM

## 2021-06-27 DIAGNOSIS — E039 Hypothyroidism, unspecified: Secondary | ICD-10-CM | POA: Diagnosis not present

## 2021-06-27 DIAGNOSIS — E872 Acidosis, unspecified: Secondary | ICD-10-CM | POA: Diagnosis not present

## 2021-06-27 DIAGNOSIS — I130 Hypertensive heart and chronic kidney disease with heart failure and stage 1 through stage 4 chronic kidney disease, or unspecified chronic kidney disease: Secondary | ICD-10-CM | POA: Diagnosis present

## 2021-06-27 DIAGNOSIS — I480 Paroxysmal atrial fibrillation: Secondary | ICD-10-CM | POA: Diagnosis not present

## 2021-06-27 DIAGNOSIS — R32 Unspecified urinary incontinence: Secondary | ICD-10-CM | POA: Diagnosis present

## 2021-06-27 DIAGNOSIS — Z79890 Hormone replacement therapy (postmenopausal): Secondary | ICD-10-CM

## 2021-06-27 DIAGNOSIS — E669 Obesity, unspecified: Secondary | ICD-10-CM | POA: Diagnosis present

## 2021-06-27 DIAGNOSIS — Z20822 Contact with and (suspected) exposure to covid-19: Secondary | ICD-10-CM | POA: Diagnosis not present

## 2021-06-27 DIAGNOSIS — J690 Pneumonitis due to inhalation of food and vomit: Secondary | ICD-10-CM | POA: Diagnosis present

## 2021-06-27 DIAGNOSIS — Z7401 Bed confinement status: Secondary | ICD-10-CM | POA: Diagnosis not present

## 2021-06-27 DIAGNOSIS — E440 Moderate protein-calorie malnutrition: Secondary | ICD-10-CM | POA: Diagnosis present

## 2021-06-27 DIAGNOSIS — J189 Pneumonia, unspecified organism: Secondary | ICD-10-CM | POA: Diagnosis present

## 2021-06-27 DIAGNOSIS — L249 Irritant contact dermatitis, unspecified cause: Secondary | ICD-10-CM | POA: Diagnosis present

## 2021-06-27 DIAGNOSIS — N1832 Chronic kidney disease, stage 3b: Secondary | ICD-10-CM | POA: Diagnosis present

## 2021-06-27 DIAGNOSIS — I517 Cardiomegaly: Secondary | ICD-10-CM | POA: Diagnosis not present

## 2021-06-27 DIAGNOSIS — J90 Pleural effusion, not elsewhere classified: Secondary | ICD-10-CM | POA: Diagnosis not present

## 2021-06-27 DIAGNOSIS — M8580 Other specified disorders of bone density and structure, unspecified site: Secondary | ICD-10-CM | POA: Diagnosis present

## 2021-06-27 DIAGNOSIS — Z8249 Family history of ischemic heart disease and other diseases of the circulatory system: Secondary | ICD-10-CM

## 2021-06-27 DIAGNOSIS — L409 Psoriasis, unspecified: Secondary | ICD-10-CM | POA: Diagnosis present

## 2021-06-27 DIAGNOSIS — R1310 Dysphagia, unspecified: Secondary | ICD-10-CM

## 2021-06-27 DIAGNOSIS — K220 Achalasia of cardia: Secondary | ICD-10-CM

## 2021-06-27 DIAGNOSIS — R531 Weakness: Secondary | ICD-10-CM

## 2021-06-27 DIAGNOSIS — Z91199 Patient's noncompliance with other medical treatment and regimen due to unspecified reason: Secondary | ICD-10-CM

## 2021-06-27 DIAGNOSIS — G249 Dystonia, unspecified: Secondary | ICD-10-CM | POA: Diagnosis present

## 2021-06-27 DIAGNOSIS — Z79899 Other long term (current) drug therapy: Secondary | ICD-10-CM

## 2021-06-27 DIAGNOSIS — L304 Erythema intertrigo: Secondary | ICD-10-CM | POA: Diagnosis present

## 2021-06-27 DIAGNOSIS — Z888 Allergy status to other drugs, medicaments and biological substances status: Secondary | ICD-10-CM

## 2021-06-27 HISTORY — DX: Parkinson's disease without dyskinesia, without mention of fluctuations: G20.A1

## 2021-06-27 HISTORY — DX: Parkinson's disease: G20

## 2021-06-27 LAB — COMPREHENSIVE METABOLIC PANEL
ALT: 19 U/L (ref 0–44)
AST: 35 U/L (ref 15–41)
Albumin: 3.8 g/dL (ref 3.5–5.0)
Alkaline Phosphatase: 75 U/L (ref 38–126)
Anion gap: 15 (ref 5–15)
BUN: 55 mg/dL — ABNORMAL HIGH (ref 8–23)
CO2: 22 mmol/L (ref 22–32)
Calcium: 10.4 mg/dL — ABNORMAL HIGH (ref 8.9–10.3)
Chloride: 100 mmol/L (ref 98–111)
Creatinine, Ser: 2.51 mg/dL — ABNORMAL HIGH (ref 0.61–1.24)
GFR, Estimated: 25 mL/min — ABNORMAL LOW (ref >60)
Glucose, Bld: 155 mg/dL — ABNORMAL HIGH (ref 70–99)
Potassium: 5.3 mmol/L — ABNORMAL HIGH (ref 3.5–5.1)
Sodium: 137 mmol/L (ref 135–145)
Total Bilirubin: 2.7 mg/dL — ABNORMAL HIGH (ref 0.3–1.2)
Total Protein: 6.9 g/dL (ref 6.5–8.1)

## 2021-06-27 LAB — RESP PANEL BY RT-PCR (FLU A&B, COVID) ARPGX2
Influenza A by PCR: NEGATIVE
Influenza B by PCR: NEGATIVE
SARS Coronavirus 2 by RT PCR: NEGATIVE

## 2021-06-27 LAB — CBC WITH DIFFERENTIAL/PLATELET
Abs Immature Granulocytes: 0.07 10*3/uL (ref 0.00–0.07)
Basophils Absolute: 0 10*3/uL (ref 0.0–0.1)
Basophils Relative: 0 %
Eosinophils Absolute: 0.3 10*3/uL (ref 0.0–0.5)
Eosinophils Relative: 3 %
HCT: 48.7 % (ref 39.0–52.0)
Hemoglobin: 15.6 g/dL (ref 13.0–17.0)
Immature Granulocytes: 1 %
Lymphocytes Relative: 5 %
Lymphs Abs: 0.5 10*3/uL — ABNORMAL LOW (ref 0.7–4.0)
MCH: 28.9 pg (ref 26.0–34.0)
MCHC: 32 g/dL (ref 30.0–36.0)
MCV: 90.4 fL (ref 80.0–100.0)
Monocytes Absolute: 1 10*3/uL (ref 0.1–1.0)
Monocytes Relative: 9 %
Neutro Abs: 9.9 10*3/uL — ABNORMAL HIGH (ref 1.7–7.7)
Neutrophils Relative %: 82 %
Platelets: 231 10*3/uL (ref 150–400)
RBC: 5.39 MIL/uL (ref 4.22–5.81)
RDW: 13.7 % (ref 11.5–15.5)
WBC: 11.9 10*3/uL — ABNORMAL HIGH (ref 4.0–10.5)
nRBC: 0 % (ref 0.0–0.2)

## 2021-06-27 LAB — PROTIME-INR
INR: 1.5 — ABNORMAL HIGH (ref 0.8–1.2)
Prothrombin Time: 17.8 seconds — ABNORMAL HIGH (ref 11.4–15.2)

## 2021-06-27 LAB — APTT: aPTT: 33 seconds (ref 24–36)

## 2021-06-27 LAB — LACTIC ACID, PLASMA: Lactic Acid, Venous: 4.4 mmol/L (ref 0.5–1.9)

## 2021-06-27 MED ORDER — VANCOMYCIN HCL 2000 MG/400ML IV SOLN
2000.0000 mg | Freq: Once | INTRAVENOUS | Status: AC
  Administered 2021-06-28: 2000 mg via INTRAVENOUS
  Filled 2021-06-27: qty 400

## 2021-06-27 MED ORDER — LACTATED RINGERS IV SOLN: INTRAVENOUS | Status: AC

## 2021-06-27 MED ORDER — LACTATED RINGERS IV BOLUS (SEPSIS)
1000.0000 mL | Freq: Once | INTRAVENOUS | Status: AC
  Administered 2021-06-27: 1000 mL via INTRAVENOUS

## 2021-06-27 MED ORDER — LACTATED RINGERS IV BOLUS (SEPSIS): 1000.0000 mL | Freq: Once | INTRAVENOUS | Status: DC

## 2021-06-27 MED ORDER — VANCOMYCIN HCL IN DEXTROSE 1-5 GM/200ML-% IV SOLN: 1000.0000 mg | Freq: Once | INTRAVENOUS | Status: DC

## 2021-06-27 MED ORDER — SODIUM CHLORIDE 0.9 % IV BOLUS
2500.0000 mL | Freq: Once | INTRAVENOUS | Status: AC
  Administered 2021-06-28: 2500 mL via INTRAVENOUS

## 2021-06-27 MED ORDER — METRONIDAZOLE 500 MG/100ML IV SOLN
500.0000 mg | Freq: Once | INTRAVENOUS | Status: AC
  Administered 2021-06-28: 500 mg via INTRAVENOUS
  Filled 2021-06-27: qty 100

## 2021-06-27 MED ORDER — LACTATED RINGERS IV BOLUS (SEPSIS): 500.0000 mL | Freq: Once | INTRAVENOUS | Status: DC

## 2021-06-27 MED ORDER — SODIUM CHLORIDE 0.9 % IV SOLN
2.0000 g | Freq: Once | INTRAVENOUS | Status: AC
  Administered 2021-06-28: 2 g via INTRAVENOUS
  Filled 2021-06-27: qty 2

## 2021-06-27 MED ORDER — ALPRAZOLAM 0.25 MG PO TABS
0.2500 mg | ORAL_TABLET | Freq: Once | ORAL | Status: DC
  Filled 2021-06-27: qty 1

## 2021-06-27 NOTE — ED Provider Triage Note (Signed)
Emergency Medicine Provider Triage Evaluation Note  [name] , a 81 y.o. male  was evaluated in triage.  Pt complains of unsure.  Patient brought in by [facility] for unclear reason.  Per EMS, patient's family stated that he has recently had medication change.  Patient recently diagnosed with Parkinson's.  Patient also recently worked up for suspected UTI.  On examination/interview, patient moans in response to questions.  Patient unable to have conversation.  This patient needs a room for sepsis work-up, further evaluation and management.  His family has not accompanied him to the hospital, therefore it is difficult to determine if this patient has deteriorated or if this is his baseline status.  Review of Systems  Positive: Unable to determine Negative: Unable to determine  Physical Exam  BP (!) 157/122 (BP Location: Right Arm)    Pulse 61    Temp 99.7 F (37.6 C) (Oral)    Resp 18    SpO2 96%  Gen:   Awake, no distress   Resp:  Normal effort  MSK:   Moves extremities without difficulty  Other:  Patient unable to follow commands.  Patient continuously moans.  Medical Decision Making  Medically screening exam initiated at 9:43 PM.  Appropriate orders placed.  Dailyn Kempner was informed that the remainder of the evaluation will be completed by another provider, this initial triage assessment does not replace that evaluation, and the importance of remaining in the ED until their evaluation is complete.     Azucena Cecil, PA-C 06/27/21 2146

## 2021-06-27 NOTE — Sepsis Progress Note (Signed)
Following per sepsis protocol   

## 2021-06-27 NOTE — ED Provider Notes (Signed)
[facility] EMERGENCY DEPARTMENT Provider Note   CSN: [id number] Arrival date & time: 06/27/21  2125     History  Chief Complaint  Patient presents with   Altered Mental Status    [name] is a 81 y.o. male with a history of parkinsons disease, T2DM, CKD, GERD, hypertension, & hyperlipidemia who presents to the ED via EMS for evaluation of weakness x 4 days. Patient is not consistently answering questions, unable to consistently follow commands, level 5 caveat applies secondary to AMS.   I called & spoke with patient's daughter Maudie Mercury for additional history- she relays patient has had waxing/waning mental status for the past few months, was recently diagnosed with Parkinsons and started on Carbidopa-Levodopa by neurology 06/14/21. He has been followed by psychiatry because he suffers from significant anxiety, recent med changes include tapering off of lorazepam (last dose about 1 week ago and has seemed to have mental status improvement since stopping), and tapered from 40 mg to 10 mg of Paxil, currently taking Gabitril. While his mental status seemed to be improving after stopping lorazepam, over the past 4-5 days his physical abilities have significantly declined. He was previously ambulatory with a walker & assistance short distances, now he is too weak to walk, he needs assistance to stand and urinate, he has not left his recliner in days. He has had 2 floor level falls without head injury. He developed right hand contracture and difficulty moving his feet, they called neurology who recommended stopping Carbidopa-Levodopa as they felt this could be a side effect. Tremors have returned since stopping Carbidopa-Levodopa, no other significant change they have noted at home. He has had poor PO intake and dark urine. Tonight he took some tylenol and subsequently had emesis x 1 then became very anxious with reports of trouble breathing and requested family to call 911.    HPI      Home Medications Prior to Admission medications   Medication Sig Start Date End Date Taking? Authorizing Provider  ACCU-CHEK SMARTVIEW test strip  12/23/19   [provider]  apixaban (ELIQUIS) 5 MG TABS tablet Take 1 tablet (5 mg total) by mouth 2 (two) times daily. 03/19/21   Supple, Megan E, RPH-CPP  carbidopa-levodopa (SINEMET IR) 25-100 MG tablet Take 1 tablet by mouth 3 (three) times daily. 7am/11am/4pm 06/14/21   Tat, Eustace Quail, DO  levothyroxine (SYNTHROID) 137 MCG tablet Take 137 mcg by mouth daily before breakfast. 05/07/21   [provider]  LORazepam (ATIVAN) 1 MG tablet Take 0.5-1 mg by mouth See admin instructions. Take 0.5-1 tablet (0.5-1 mg) by mouth in the morning, take 0.5 tablet (0.5 mg) by mouth in the afternoon & take 0.5 tablet (0.5 mg) by mouth in the evening.    [provider]  losartan (COZAAR) 50 MG tablet Take 1 tablet (50 mg total) by mouth daily. Patient taking differently: Take 50 mg by mouth every evening. 03/03/18   Deboraha Sprang, MD  melatonin 5 MG TABS Take 5 mg by mouth at bedtime.    [provider]  pantoprazole (PROTONIX) 40 MG tablet Take 40 mg by mouth daily. 06/05/21   [provider]  PARoxetine (PAXIL) 40 MG tablet Take 40 mg by mouth daily.    [provider]  RESTASIS 0.05 % ophthalmic emulsion Place 1 drop into both eyes 2 (two) times daily as needed (dry eyes). 06/09/19   [provider]  rosuvastatin (CRESTOR) 10 MG tablet Take 1 tablet (10 mg  total) by mouth daily. 03/19/21   Supple, Megan E, RPH-CPP  tiaGABine (GABITRIL) 4 MG tablet Take 10 mg by mouth 2 (two) times daily. 3 tablets BID    [provider]  torsemide (DEMADEX) 20 MG tablet Take 1 tablet (20 mg total) by mouth as needed (swelling). Patient taking differently: Take 20 mg by mouth daily as needed (swelling). 05/04/21   Troy Sine, MD  traZODone (DESYREL) 50 MG tablet Take 50 mg by mouth at bedtime. 05/16/21    [provider]      Allergies    Hctz [hydrochlorothiazide], Lexapro [escitalopram oxalate], Lisinopril, Silodosin, and Tamsulosin    Review of Systems   Review of Systems  Unable to perform ROS: Mental status change   Physical Exam Updated Vital Signs BP (!) 157/122 (BP Location: Right Arm)    Pulse 61    Temp 99.7 F (37.6 C) (Oral)    Resp 18    SpO2 96%  Physical Exam Vitals and nursing note reviewed.  Constitutional:      Appearance: He is not toxic-appearing.  HENT:     Head: Normocephalic and atraumatic. No raccoon eyes or Battle's sign.     Mouth/Throat:     Mouth: Mucous membranes are dry.  Eyes:     Pupils: Pupils are equal, round, and reactive to light.  Cardiovascular:     Rate and Rhythm: Normal rate and regular rhythm.  Pulmonary:     Effort: Pulmonary effort is normal.     Breath sounds: No wheezing or rales.  Chest:     Chest wall: No tenderness.  Abdominal:     Palpations: Abdomen is soft.     Tenderness: There is no abdominal tenderness. There is no guarding or rebound.  Musculoskeletal:     Cervical back: No rigidity.     Comments: Right knee bruise present. Some scattered abrasions. No focal bony tenderness to palpation throughout extremities/midline spine.   Skin:    General: Skin is warm and dry.     Findings: Rash (erythematous rash to the extremities and to the left thigh noted, no sloughing, bullae, or vesicles) present.  Neurological:     Mental Status: He is alert.     Comments: Alert. Confused. Slow to speak. Not consistently following commands. Right wrist is contracted. He is moving the bilateral upper extremities, fairly minimal movement in the [place]. Tremulous. Fasciculations x 4.     ED Results / Procedures / Treatments   Labs (all labs ordered are listed, but only abnormal results are displayed) Labs Reviewed  LACTIC ACID, PLASMA - Abnormal; Notable for the following components:      Result Value   Lactic Acid, Venous 4.4  (*)    All other components within normal limits  LACTIC ACID, PLASMA - Abnormal; Notable for the following components:   Lactic Acid, Venous 2.5 (*)    All other components within normal limits  COMPREHENSIVE METABOLIC PANEL - Abnormal; Notable for the following components:   Potassium 5.3 (*)    Glucose, Bld 155 (*)    BUN 55 (*)    Creatinine, Ser 2.51 (*)    Calcium 10.4 (*)    Total Bilirubin 2.7 (*)    GFR, Estimated 25 (*)    All other components within normal limits  CBC WITH DIFFERENTIAL/PLATELET - Abnormal; Notable for the following components:   WBC 11.9 (*)    Neutro Abs 9.9 (*)    Lymphs Abs 0.5 (*)  All other components within normal limits  PROTIME-INR - Abnormal; Notable for the following components:   Prothrombin Time 17.8 (*)    INR 1.5 (*)    All other components within normal limits  CK - Abnormal; Notable for the following components:   Total CK 435 (*)    All other components within normal limits  RESP PANEL BY RT-PCR (FLU A&B, COVID) ARPGX2  CULTURE, BLOOD (ROUTINE X 2)  CULTURE, BLOOD (ROUTINE X 2)  URINE CULTURE  APTT  TSH  URINALYSIS, ROUTINE W REFLEX MICROSCOPIC  GLUTAMIC ACID DECARBOXYLASE AUTO ABS    EKG EKG Interpretation  Date/Time:  Wednesday June 27 2021 22:04:40 EST Ventricular Rate:  156 PR Interval:    QRS Duration: 162 QT Interval:  272 QTC Calculation: 438 R Axis:   258 Text Interpretation: Ventricular-paced rhythm with occasional Premature ventricular complexes Abnormal ECG Poor data quality Confirmed by Quintella Reichert [phone]) on 06/27/2021 11:42:35 PM  Radiology CT Head Wo Contrast  Result Date: 06/28/2021 CLINICAL DATA:  Altered mental status. EXAM: CT HEAD WITHOUT CONTRAST TECHNIQUE: Contiguous axial images were obtained from the base of the skull through the vertex without intravenous contrast. RADIATION DOSE REDUCTION: This exam was performed according to the departmental dose-optimization program which includes  automated exposure control, adjustment of the mA and/or kV according to patient size and/or use of iterative reconstruction technique. COMPARISON:  None. FINDINGS: Brain: Moderate age-related atrophy and chronic microvascular ischemic changes. There is no acute intracranial hemorrhage. No mass effect or midline shift. No extra-axial fluid collection. Vascular: No hyperdense vessel or unexpected calcification. Skull: Normal. Negative for fracture or focal lesion. Sinuses/Orbits: No acute finding. Other: None IMPRESSION: 1. No acute intracranial pathology. 2. Moderate age-related atrophy and chronic microvascular ischemic changes. Electronically Signed   By: Anner Crete M.D.   On: 06/28/2021 03:02   DG Chest Port 1 View  Result Date: 06/27/2021 CLINICAL DATA:  Sepsis workup. EXAM: PORTABLE CHEST 1 VIEW COMPARISON:  PA and lateral chest 04/15/2021 FINDINGS: The heart enlarged but stable. Central vessels are normal caliber. Left chest dual lead pacing system and wire insertions are unchanged. The aorta is tortuous with ectasia and patchy calcification. Stable mediastinum. There is increased opacity in the left lower lung field concerning for pneumonia or aspiration. Small left pleural effusion may also be forming. The remaining lungs are clear. Mild thoracic spondylosis. Osteopenia. IMPRESSION: Increased opacity in the left lower lobe concerning for aspiration or pneumonia. Question small left pleural effusion. Aortic atherosclerosis. Cardiomegaly. Electronically Signed   By: Telford Nab M.D.   On: 06/27/2021 22:50    Procedures .Critical Care Performed by: Amaryllis Dyke, PA-C Authorized by: Amaryllis Dyke, PA-C    CRITICAL CARE Performed by: Kennith Maes   Total critical care time: 45 minutes  Critical care time was exclusive of separately billable procedures and treating other patients.  Critical care was necessary to treat or prevent imminent or life-threatening  deterioration.  Critical care was time spent personally by me on the following activities: development of treatment plan with patient and/or surrogate as well as nursing, discussions with consultants, evaluation of patient's response to treatment, examination of patient, obtaining history from patient or surrogate, ordering and performing treatments and interventions, ordering and review of laboratory studies, ordering and review of radiographic studies, pulse oximetry and re-evaluation of patient's condition.    Medications Ordered in ED Medications  lactated ringers infusion (has no administration in time range)  vancomycin (VANCOREADY) IVPB 2000 mg/400 mL (2,000  mg Intravenous New Bag/Given 06/28/21 0135)  ceFEPIme (MAXIPIME) 2 g in sodium chloride 0.9 % 100 mL IVPB (has no administration in time range)  vancomycin (VANCOREADY) IVPB 750 mg/150 mL (has no administration in time range)  LORazepam (ATIVAN) injection 1 mg (has no administration in time range)  lactated ringers bolus 1,000 mL (0 mLs Intravenous Stopped 06/28/21 0033)  ceFEPIme (MAXIPIME) 2 g in sodium chloride 0.9 % 100 mL IVPB (0 g Intravenous Stopped 06/28/21 0236)  metroNIDAZOLE (FLAGYL) IVPB 500 mg (0 mg Intravenous Stopped 06/28/21 0236)  sodium chloride 0.9 % bolus 2,500 mL (2,500 mLs Intravenous New Bag/Given 06/28/21 0033)  haloperidol lactate (HALDOL) injection 2 mg (2 mg Intramuscular Given 06/28/21 0038)    ED Course/ Medical Decision Making/ A&P                           Medical Decision Making Amount and/or Complexity of Data Reviewed Labs: ordered.  Risk Prescription drug management. Decision regarding hospitalization.   Patient presents to the ED for evaluation of weakness/AMS, this involves an extensive number of treatment options, and is a complaint that carries with it a high risk of complications and morbidity. Nontoxic, vitals w/ mild HTN on arrival.  On initial assessment patient does seem quite confused-  R hand contracture noted with fasciculation throughout.   Additional history obtained:  Additional history obtained from chart review & discussion with patient's family members.  - Discussed with patient's daughter Maudie Mercury via telephone as discussed in HPI, confirmed and further discussed with his other daughter and spouse @ bedside on their arrival.  External records from outside source obtained and reviewed including most recent palliative care & neurology notes.  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  - seen by Palliative care earlier today.   Lactic acid > 4.0, oral temp 99.7, mild leukocytosis, CXR with question of pneumonia, code sepsis initiated w/ broad spectrum abx and 30 cc/kg bolus.   23:20: Discussed w/ pharmacist, 30 cc/kg bolus ordered utilizing ideal adjusted body weight, unknown source,   Patient too anxious to go to CT, family requesting xanax as opposed to ativan as patient tolerates this better. Initial plan was for very low dose xanax however patient did not pass swallow screen, therefore will give Haldol IM.   EKG: Ventricular-paced rhythm with occasional Premature ventricular complexes Abnormal ECG Poor data quality  Lab Tests:  I Ordered, reviewed, and interpreted labs, pertinent results include:  CBC: Mild leukocytosis.  CMP: AKI with creatinine 2.51 BUN 55, most recently 1.64 and 17, mild hyperkalemia & hypercalcemia  PT/INR: Mild elevation CK: Mild elevation APTT: WNL COVID/Flu: Negative  TSH: Negative  Imaging Studies ordered:  I ordered imaging studies which included CXR,  I independently reviewed & interpreted imaging & am in agreement with radiology impression which shows:  Increased opacity in the left lower lobe concerning for aspiration or pneumonia. Question small  left pleural effusion. Aortic atherosclerosis. Cardiomegaly  ED Course:  01:52: CONSULT: Attending discussed with neurologist Dr. Leonel Ramsay- will see patient.   Patient's family reporting some improvement S/p haldol, on my re-assessment he does seem to have clearer speech with more appropriate question answering and attempts to follow commands, they relay his mental status seems more like himself.   CT head: 1. No acute intracranial pathology. 2. Moderate age-related atrophy and chronic  microvascular ischemic changes  Patient evaluated by Dr. Leonel Ramsay, patient's multiple medication adjustments recently likely is a component of etiology to his AMS/neurologic sxs, will consult and provide recommendations for medication adjustments. Will discuss w/ medicine for admission for AMS, AKI, lactic acidosis, and medication management, also receiving abx for possible sepsis.   03:34: CONSULT: Discussed with hospitalist Dr. Velia Meyer- accepts admission.   This is a shared visit with supervising physician Dr. Ralene Bathe who has independently evaluated patient & provided guidance in evaluation/management/disposition, in agreement with care   Portions of this note were generated with Dragon dictation software. Dictation errors may occur despite best attempts at proofreading.         Final Clinical Impression(s) / ED Diagnoses Final diagnoses:  Altered mental status, unspecified altered mental status type  AKI (acute kidney injury) ([place])  Lactic acidosis    Rx / DC Orders ED Discharge Orders     None         Amaryllis Dyke, PA-C 06/28/21 0429    Quintella Reichert, MD 06/28/21 [phone]

## 2021-06-27 NOTE — ED Triage Notes (Signed)
Pt comes from home via [facility] EMS, diagnosed with Parkinsons last week, over the past week arm has become contracted, has recently had medication changes and taken off his several meds.

## 2021-06-27 NOTE — Progress Notes (Signed)
[facility] Consult Note Telephone: [phone]  Fax: [phone]  PATIENT NAME: [name] [place] [address] [phone] (home)  DOB: 12/16/1940 MRN: [id number]  PRIMARY CARE PROVIDER:    [name], MD,  [address] [place] [address] [phone]  REFERRING PROVIDER:   [name], MD [address] [place],  [place] [address] [phone]  RESPONSIBLE PARTYKingsley Callander Maudie Mercury is daughter Contact Information     Name Relation Home Work Mobile   Axe,Carolyn Spouse [phone]  [phone]   Lenoria Chime Daughter   [phone]     TELEHEALTH VISIT STATEMENT Due to the COVID-19 crisis, this visit was done via telemedicine from my office and it was initiated and consent by this patient and or family. Video-audio (telehealth) contact was unable to be done due to technical barriers from the patients side. I connected with patient OR PROXY by a telephone  and verified that I am speaking with the correct person. I discussed the limitations of evaluation and management by telemedicine. The patient expressed understanding and agreed to proceed. Palliative Care was asked to follow this patient to address advance care planning, complex medical decision making and goals of care clarification.    Hoyle Sauer and Maudie Mercury are present during visit.    ASSESSMENT AND / RECOMMENDATIONS:   CODE STATUS: Patient is a Do Not Resuscitate  Goals of Care: Goals include to maximize quality of life and symptom management  Symptom Management/Plan: Panic attacks: Followed by Psych, next appointment.  06/29/2021. Patient is weaning off Paxil and currently off Lorazepam. Maudie Mercury reports less lethargy and confusion and panic attacks since medication adjustment by Psychiatrist.  Patient continues on Tiagabine. Discussed Calming/de-escalation techniques, music therapy, breathing  techniques. Parkinson Disease: New diagnosis 2 weeks ago from Neurologist; started on Carbadopa/levodopa. Follow up with Neurologist as ordered. Education provided on Parkinson disease as a progressive disease and what to expect.  Dysphagia: narrowing in esophagus, throat stretching procedure completed. Mechanical soft diet discussed. Aspiration  precautions discussed. ST consult recommended.  Gait disturbance: Recent fall without injuries. Patient is a high fall risk. PT for strengthening and gait training is ongoing.  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 Followed by Cardiologist.  Follow up: Palliative care will continue to follow for complex medical decision making, advance care planning, and clarification of goals. Return 6 weeks or prn. Encouraged to call provider sooner with any concerns.   Family /Caregiver/Community Supports: Patient lives at home with his wife. Grown children Tammy and Maudie Mercury are involved in his care. Strong family support system identified.   HOSPICE ELIGIBILITY/DIAGNOSIS: TBD  Chief Complaint: Follow up visit  HISTORY OF PRESENT ILLNESS:  [name] is a 81 y.o. year old male  with multiple medical conditions including Panic attacks started in 1991, reduced frequency since patient discontinued from Lorazepam and weaning from Paxil. New diagnosis of Parkinson Disease, started on Carbidopa/levodopa.  History of Complete heart block, gait disturbance, Fall, Afib, cognitive deficits. Patient denies pain/discomfort, endorses occasional anxiety and unsteady gait.  History obtained from review of EMR, discussion with primary team, caregiver, family and/or Mr. Plack.  Review and summarization of Epic records shows history from other than patient. Rest of 10 point ROS asked and negative.  I reviewed as needed, available labs, patient records, imaging, studies and related documents from the EMR.   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   Resolved Ambulatory Problems    Diagnosis Date Noted   No Resolved Ambulatory Problems   Past Medical History:  Diagnosis Date   CKD (chronic kidney disease)    DM type 2 (diabetes mellitus, type 2) (HCC)    ED (erectile dysfunction)    Fatty liver    GERD (gastroesophageal reflux disease)    Gout    H/O adenomatous polyp of colon    HLD (hyperlipidemia)    HTN (hypertension)    Hypothyroid    Panic attacks    Psoriasis     SOCIAL HX:  Social History   Tobacco Use   Smoking status: Never   Smokeless tobacco: Never  Substance Use Topics   Alcohol use: No    Alcohol/week: 0.0 standard drinks     FAMILY HX:  Father: Emphysema Sister: Lung CA  ALLERGIES:  Allergies  Allergen Reactions   Hctz [Hydrochlorothiazide] Other (See Comments)    Gout flare    Lexapro [Escitalopram Oxalate]     Ineffective for anxiety    Lisinopril     angioedema    Silodosin Diarrhea   Tamsulosin Other (See Comments)    visual changes      PERTINENT MEDICATIONS:  Outpatient Encounter Medications as of 06/27/2021  Medication Sig   ACCU-CHEK SMARTVIEW test strip    apixaban (ELIQUIS) 5 MG TABS tablet Take 1 tablet (5 mg total) by mouth 2 (two) times daily.   carbidopa-levodopa (SINEMET IR) 25-100 MG tablet Take 1 tablet by mouth 3 (three) times daily. 7am/11am/4pm   levothyroxine (SYNTHROID) 137 MCG tablet Take 137 mcg by mouth daily before breakfast.   LORazepam (ATIVAN) 1 MG tablet Take 0.5-1 mg by mouth See admin instructions. Take 0.5-1 tablet (0.5-1 mg) by mouth in the morning, take 0.5 tablet (0.5 mg) by mouth in the afternoon & take 0.5 tablet (0.5 mg) by mouth in the evening.   losartan (COZAAR) 50 MG tablet Take 1 tablet (50 mg total) by  mouth daily. (Patient taking differently: Take 50 mg by mouth every evening.)   melatonin 5 MG TABS Take 5 mg by mouth at bedtime.   pantoprazole (PROTONIX) 40 MG tablet Take 40 mg by mouth daily.   PARoxetine (PAXIL) 40 MG tablet Take 40 mg by mouth daily.   RESTASIS 0.05 % ophthalmic emulsion Place 1 drop into both eyes 2 (two) times daily as needed (dry eyes).   rosuvastatin (CRESTOR) 10 MG tablet Take 1 tablet (10 mg total) by mouth daily.   tiaGABine (GABITRIL) 4 MG tablet Take 10 mg by mouth 2 (two) times daily. 3 tablets BID   torsemide (DEMADEX) 20 MG tablet Take 1 tablet (20 mg total) by mouth as needed (swelling). (Patient taking differently: Take 20 mg by mouth daily as needed (swelling).)   traZODone (DESYREL) 50 MG tablet Take 50 mg by mouth at bedtime.   No facility-administered encounter medications on file as of 06/27/2021.   I spent 40 minutes providing this consultation; time includes spent with patient/family, chart review and documentation. More than 50% of the time in this consultation was spent on care coordination  Thank you for the opportunity to participate in the care of Mr. Prashad.  The palliative care team will continue to follow. Please call our  office at [phone] if we can be of additional assistance.   Note: Portions of this note were generated with Lobbyist. Dictation errors may occur despite best attempts at proofreading.  Teodoro Spray, NP

## 2021-06-28 ENCOUNTER — Encounter (HOSPITAL_COMMUNITY): Payer: Self-pay | Admitting: Internal Medicine

## 2021-06-28 ENCOUNTER — Inpatient Hospital Stay (HOSPITAL_COMMUNITY): Payer: Medicare HMO

## 2021-06-28 ENCOUNTER — Telehealth: Payer: Self-pay | Admitting: Neurology

## 2021-06-28 ENCOUNTER — Emergency Department (HOSPITAL_COMMUNITY): Payer: Medicare HMO

## 2021-06-28 DIAGNOSIS — E872 Acidosis, unspecified: Secondary | ICD-10-CM | POA: Diagnosis present

## 2021-06-28 DIAGNOSIS — R6520 Severe sepsis without septic shock: Secondary | ICD-10-CM

## 2021-06-28 DIAGNOSIS — G20 Parkinson's disease: Secondary | ICD-10-CM | POA: Diagnosis not present

## 2021-06-28 DIAGNOSIS — K220 Achalasia of cardia: Secondary | ICD-10-CM

## 2021-06-28 DIAGNOSIS — G20A1 Parkinson's disease without dyskinesia, without mention of fluctuations: Secondary | ICD-10-CM | POA: Diagnosis present

## 2021-06-28 DIAGNOSIS — I5022 Chronic systolic (congestive) heart failure: Secondary | ICD-10-CM | POA: Diagnosis present

## 2021-06-28 DIAGNOSIS — I442 Atrioventricular block, complete: Secondary | ICD-10-CM | POA: Diagnosis present

## 2021-06-28 DIAGNOSIS — G9341 Metabolic encephalopathy: Secondary | ICD-10-CM | POA: Diagnosis not present

## 2021-06-28 DIAGNOSIS — R531 Weakness: Secondary | ICD-10-CM

## 2021-06-28 DIAGNOSIS — N179 Acute kidney failure, unspecified: Secondary | ICD-10-CM

## 2021-06-28 DIAGNOSIS — G249 Dystonia, unspecified: Secondary | ICD-10-CM | POA: Diagnosis present

## 2021-06-28 DIAGNOSIS — I480 Paroxysmal atrial fibrillation: Secondary | ICD-10-CM | POA: Diagnosis not present

## 2021-06-28 DIAGNOSIS — A419 Sepsis, unspecified organism: Secondary | ICD-10-CM | POA: Diagnosis present

## 2021-06-28 DIAGNOSIS — Z20822 Contact with and (suspected) exposure to covid-19: Secondary | ICD-10-CM | POA: Diagnosis present

## 2021-06-28 DIAGNOSIS — J690 Pneumonitis due to inhalation of food and vomit: Secondary | ICD-10-CM | POA: Diagnosis present

## 2021-06-28 DIAGNOSIS — G4733 Obstructive sleep apnea (adult) (pediatric): Secondary | ICD-10-CM | POA: Diagnosis present

## 2021-06-28 DIAGNOSIS — J189 Pneumonia, unspecified organism: Secondary | ICD-10-CM | POA: Diagnosis not present

## 2021-06-28 DIAGNOSIS — Z66 Do not resuscitate: Secondary | ICD-10-CM | POA: Diagnosis present

## 2021-06-28 DIAGNOSIS — E440 Moderate protein-calorie malnutrition: Secondary | ICD-10-CM | POA: Diagnosis present

## 2021-06-28 DIAGNOSIS — N1832 Chronic kidney disease, stage 3b: Secondary | ICD-10-CM | POA: Diagnosis present

## 2021-06-28 DIAGNOSIS — I10 Essential (primary) hypertension: Secondary | ICD-10-CM | POA: Diagnosis present

## 2021-06-28 DIAGNOSIS — R4182 Altered mental status, unspecified: Secondary | ICD-10-CM | POA: Diagnosis not present

## 2021-06-28 DIAGNOSIS — R0602 Shortness of breath: Secondary | ICD-10-CM | POA: Diagnosis not present

## 2021-06-28 DIAGNOSIS — E039 Hypothyroidism, unspecified: Secondary | ICD-10-CM | POA: Diagnosis present

## 2021-06-28 DIAGNOSIS — I130 Hypertensive heart and chronic kidney disease with heart failure and stage 1 through stage 4 chronic kidney disease, or unspecified chronic kidney disease: Secondary | ICD-10-CM | POA: Diagnosis present

## 2021-06-28 DIAGNOSIS — E1122 Type 2 diabetes mellitus with diabetic chronic kidney disease: Secondary | ICD-10-CM | POA: Diagnosis present

## 2021-06-28 DIAGNOSIS — E860 Dehydration: Secondary | ICD-10-CM | POA: Diagnosis present

## 2021-06-28 DIAGNOSIS — F410 Panic disorder [episodic paroxysmal anxiety] without agoraphobia: Secondary | ICD-10-CM | POA: Diagnosis present

## 2021-06-28 DIAGNOSIS — Z6841 Body Mass Index (BMI) 40.0 and over, adult: Secondary | ICD-10-CM | POA: Diagnosis not present

## 2021-06-28 LAB — CBC WITH DIFFERENTIAL/PLATELET
Abs Immature Granulocytes: 0.03 10*3/uL (ref 0.00–0.07)
Basophils Absolute: 0 10*3/uL (ref 0.0–0.1)
Basophils Relative: 0 %
Eosinophils Absolute: 0.2 10*3/uL (ref 0.0–0.5)
Eosinophils Relative: 2 %
HCT: 38.9 % — ABNORMAL LOW (ref 39.0–52.0)
Hemoglobin: 12.2 g/dL — ABNORMAL LOW (ref 13.0–17.0)
Immature Granulocytes: 0 %
Lymphocytes Relative: 5 %
Lymphs Abs: 0.4 10*3/uL — ABNORMAL LOW (ref 0.7–4.0)
MCH: 28.9 pg (ref 26.0–34.0)
MCHC: 31.4 g/dL (ref 30.0–36.0)
MCV: 92.2 fL (ref 80.0–100.0)
Monocytes Absolute: 0.6 10*3/uL (ref 0.1–1.0)
Monocytes Relative: 8 %
Neutro Abs: 6.8 10*3/uL (ref 1.7–7.7)
Neutrophils Relative %: 85 %
Platelets: 139 10*3/uL — ABNORMAL LOW (ref 150–400)
RBC: 4.22 MIL/uL (ref 4.22–5.81)
RDW: 13.9 % (ref 11.5–15.5)
WBC: 8 10*3/uL (ref 4.0–10.5)
nRBC: 0 % (ref 0.0–0.2)

## 2021-06-28 LAB — URINALYSIS, ROUTINE W REFLEX MICROSCOPIC
Bacteria, UA: NONE SEEN
Bilirubin Urine: NEGATIVE
Glucose, UA: NEGATIVE mg/dL
Ketones, ur: 5 mg/dL — AB
Leukocytes,Ua: NEGATIVE
Nitrite: NEGATIVE
Protein, ur: 30 mg/dL — AB
Specific Gravity, Urine: 1.023 (ref 1.005–1.030)
pH: 5 (ref 5.0–8.0)

## 2021-06-28 LAB — TSH: TSH: 1.345 u[IU]/mL (ref 0.350–4.500)

## 2021-06-28 LAB — RAPID URINE DRUG SCREEN, HOSP PERFORMED
Amphetamines: NOT DETECTED
Barbiturates: NOT DETECTED
Benzodiazepines: POSITIVE — AB
Cocaine: NOT DETECTED
Opiates: NOT DETECTED
Tetrahydrocannabinol: NOT DETECTED

## 2021-06-28 LAB — COMPREHENSIVE METABOLIC PANEL
ALT: 15 U/L (ref 0–44)
AST: 32 U/L (ref 15–41)
Albumin: 2.8 g/dL — ABNORMAL LOW (ref 3.5–5.0)
Alkaline Phosphatase: 56 U/L (ref 38–126)
Anion gap: 10 (ref 5–15)
BUN: 51 mg/dL — ABNORMAL HIGH (ref 8–23)
CO2: 18 mmol/L — ABNORMAL LOW (ref 22–32)
Calcium: 8.9 mg/dL (ref 8.9–10.3)
Chloride: 108 mmol/L (ref 98–111)
Creatinine, Ser: 2.01 mg/dL — ABNORMAL HIGH (ref 0.61–1.24)
GFR, Estimated: 33 mL/min — ABNORMAL LOW (ref >60)
Glucose, Bld: 100 mg/dL — ABNORMAL HIGH (ref 70–99)
Potassium: 4.2 mmol/L (ref 3.5–5.1)
Sodium: 136 mmol/L (ref 135–145)
Total Bilirubin: 1.9 mg/dL — ABNORMAL HIGH (ref 0.3–1.2)
Total Protein: 4.8 g/dL — ABNORMAL LOW (ref 6.5–8.1)

## 2021-06-28 LAB — CK
Total CK: 435 U/L — ABNORMAL HIGH (ref 49–397)
Total CK: 494 U/L — ABNORMAL HIGH (ref 49–397)

## 2021-06-28 LAB — GLUCOSE, CAPILLARY
Glucose-Capillary: 82 mg/dL (ref 70–99)
Glucose-Capillary: 99 mg/dL (ref 70–99)

## 2021-06-28 LAB — I-STAT VENOUS BLOOD GAS, ED
Acid-base deficit: 3 mmol/L — ABNORMAL HIGH (ref 0.0–2.0)
Bicarbonate: 19.1 mmol/L — ABNORMAL LOW (ref 20.0–28.0)
Calcium, Ion: 1.12 mmol/L — ABNORMAL LOW (ref 1.15–1.40)
HCT: 34 % — ABNORMAL LOW (ref 39.0–52.0)
Hemoglobin: 11.6 g/dL — ABNORMAL LOW (ref 13.0–17.0)
O2 Saturation: 99 %
Potassium: 4 mmol/L (ref 3.5–5.1)
Sodium: 135 mmol/L (ref 135–145)
TCO2: 20 mmol/L — ABNORMAL LOW (ref 22–32)
pCO2, Ven: 26.5 mmHg — ABNORMAL LOW (ref 44.0–60.0)
pH, Ven: 7.465 — ABNORMAL HIGH (ref 7.250–7.430)
pO2, Ven: 114 mmHg — ABNORMAL HIGH (ref 32.0–45.0)

## 2021-06-28 LAB — LACTIC ACID, PLASMA: Lactic Acid, Venous: 2.5 mmol/L (ref 0.5–1.9)

## 2021-06-28 LAB — MAGNESIUM: Magnesium: 1.6 mg/dL — ABNORMAL LOW (ref 1.7–2.4)

## 2021-06-28 LAB — PROCALCITONIN: Procalcitonin: 0.15 ng/mL

## 2021-06-28 LAB — AMMONIA: Ammonia: 36 umol/L — ABNORMAL HIGH (ref 9–35)

## 2021-06-28 MED ORDER — LORAZEPAM 2 MG/ML IJ SOLN
0.5000 mg | Freq: Two times a day (BID) | INTRAMUSCULAR | Status: DC
  Administered 2021-06-28 – 2021-06-29 (×3): 0.5 mg via INTRAVENOUS
  Filled 2021-06-28 (×3): qty 1

## 2021-06-28 MED ORDER — LORAZEPAM 2 MG/ML IJ SOLN
1.0000 mg | Freq: Once | INTRAMUSCULAR | Status: AC
  Administered 2021-06-28: 1 mg via INTRAVENOUS
  Filled 2021-06-28: qty 1

## 2021-06-28 MED ORDER — VANCOMYCIN HCL IN DEXTROSE 1-5 GM/200ML-% IV SOLN: 1000.0000 mg | INTRAVENOUS | Status: DC

## 2021-06-28 MED ORDER — PANTOPRAZOLE SODIUM 40 MG PO TBEC
40.0000 mg | DELAYED_RELEASE_TABLET | Freq: Every day | ORAL | Status: DC
  Administered 2021-06-28 – 2021-07-04 (×7): 40 mg via ORAL
  Filled 2021-06-28 (×7): qty 1

## 2021-06-28 MED ORDER — ROSUVASTATIN CALCIUM 5 MG PO TABS
10.0000 mg | ORAL_TABLET | Freq: Every day | ORAL | Status: DC
  Administered 2021-06-28 – 2021-07-04 (×7): 10 mg via ORAL
  Filled 2021-06-28 (×7): qty 2

## 2021-06-28 MED ORDER — LEVOTHYROXINE SODIUM 25 MCG PO TABS
137.0000 ug | ORAL_TABLET | Freq: Every day | ORAL | Status: DC
  Administered 2021-06-28 – 2021-07-04 (×7): 137 ug via ORAL
  Filled 2021-06-28 (×7): qty 1

## 2021-06-28 MED ORDER — ACETAMINOPHEN 325 MG PO TABS
650.0000 mg | ORAL_TABLET | Freq: Four times a day (QID) | ORAL | Status: DC | PRN
  Administered 2021-06-28 – 2021-07-03 (×10): 650 mg via ORAL
  Filled 2021-06-28 (×10): qty 2

## 2021-06-28 MED ORDER — CARBIDOPA-LEVODOPA 25-100 MG PO TABS
1.0000 | ORAL_TABLET | Freq: Three times a day (TID) | ORAL | Status: DC
  Administered 2021-06-28: 1 via ORAL
  Filled 2021-06-28: qty 1

## 2021-06-28 MED ORDER — TIAGABINE HCL 4 MG PO TABS
10.0000 mg | ORAL_TABLET | Freq: Two times a day (BID) | ORAL | Status: DC
  Filled 2021-06-28: qty 1

## 2021-06-28 MED ORDER — TIAGABINE HCL 4 MG PO TABS
8.0000 mg | ORAL_TABLET | Freq: Two times a day (BID) | ORAL | Status: DC
  Administered 2021-06-29 – 2021-07-04 (×12): 8 mg via ORAL
  Filled 2021-06-28 (×14): qty 2

## 2021-06-28 MED ORDER — HALOPERIDOL LACTATE 5 MG/ML IJ SOLN
2.0000 mg | Freq: Once | INTRAMUSCULAR | Status: AC
  Administered 2021-06-28: 2 mg via INTRAMUSCULAR
  Filled 2021-06-28: qty 1

## 2021-06-28 MED ORDER — SODIUM CHLORIDE 0.9 % IV SOLN
3.0000 g | Freq: Four times a day (QID) | INTRAVENOUS | Status: AC
  Administered 2021-06-28 – 2021-07-03 (×20): 3 g via INTRAVENOUS
  Filled 2021-06-28 (×21): qty 8

## 2021-06-28 MED ORDER — SODIUM CHLORIDE 0.9 % IV SOLN
2.0000 g | Freq: Two times a day (BID) | INTRAVENOUS | Status: DC
  Administered 2021-06-28: 2 g via INTRAVENOUS
  Filled 2021-06-28: qty 2

## 2021-06-28 MED ORDER — MAGNESIUM SULFATE 2 GM/50ML IV SOLN
2.0000 g | Freq: Once | INTRAVENOUS | Status: AC
  Administered 2021-06-28: 2 g via INTRAVENOUS
  Filled 2021-06-28: qty 50

## 2021-06-28 MED ORDER — VANCOMYCIN HCL 750 MG/150ML IV SOLN: 750.0000 mg | INTRAVENOUS | Status: DC

## 2021-06-28 MED ORDER — APIXABAN 5 MG PO TABS
5.0000 mg | ORAL_TABLET | Freq: Two times a day (BID) | ORAL | Status: DC
  Administered 2021-06-28 – 2021-07-04 (×13): 5 mg via ORAL
  Filled 2021-06-28 (×13): qty 1

## 2021-06-28 MED ORDER — ACETAMINOPHEN 650 MG RE SUPP: 650.0000 mg | Freq: Four times a day (QID) | RECTAL | Status: DC | PRN

## 2021-06-28 NOTE — Progress Notes (Signed)
Pharmacy Antibiotic Note  [name] is a 81 y.o. male admitted on 06/27/2021 with altered mental status and concern for  Aspiration pneumonia . PMH significant for Parkinson's Disease. Pharmacy has been consulted for Ampicillin-sulbactam dosing.  WBC improved at 8 from 12.9 yesterday, patient currently afebrile. Renal function appears to be near baseline with Scr 2.01 (CrCl ~56 mL/min)  Plan: Ampicillin-sulbactam 3g IV q6h Monitor for signs of clinical improvement     Temp (24hrs), Avg:99.6 F (37.6 C), Min:99.5 F (37.5 C), Max:99.7 F (37.6 C)  Recent Labs  Lab 06/27/21 2157 06/27/21 2315 06/28/21 0458  WBC 11.9*  --  8.0  CREATININE 2.51*  --  2.01*  LATICACIDVEN 4.4* 2.5*  --     CrCl cannot be calculated (Unknown ideal weight.).    Allergies  Allergen Reactions   Hctz [Hydrochlorothiazide] Other (See Comments)    Gout flare    Lexapro [Escitalopram Oxalate]     Ineffective for anxiety    Lisinopril     angioedema    Silodosin Diarrhea   Tamsulosin Other (See Comments)    visual changes    Antimicrobials this admission: 1/26 ampicillin-sulbactam >> 1/25 cefepime >> 1/26 1/25 metronidazole x 1 1/25 vancomycin >> 1/26  Dose adjustments this admission: none  Microbiology results: 1/25 BCx: no growth todate  Thank you for involving pharmacy in this patient's care.  Elita Quick, PharmD PGY1 Ambulatory Care Pharmacy Resident 06/28/2021 2:24 PM  **Pharmacist phone directory can be found on [email or web address] listed under [place]**

## 2021-06-28 NOTE — Progress Notes (Signed)
Pharmacy Antibiotic Note  [name] is a 81 y.o. male admitted on 06/27/2021 with sepsis.  Pharmacy has been consulted for Vancomycin/Cefepime dosing. WBC mildly elevated. Noted renal dysfunction.   Plan: Vancomycin 2000 mg IV x 1, then 750 mg IV q24h >>>Estimated AUC: 425 Cefepime 2g IV q12h Trend WBC, temp, renal function  F/U infectious work-up Drug levels as indicated   Temp (24hrs), Avg:99.6 F (37.6 C), Min:99.5 F (37.5 C), Max:99.7 F (37.6 C)  Recent Labs  Lab 06/27/21 2157 06/27/21 2315  WBC 11.9*  --   CREATININE 2.51*  --   LATICACIDVEN 4.4* 2.5*    Estimated Creatinine Clearance: 34 mL/min (A) (by C-G formula based on SCr of 2.51 mg/dL (H)).    Allergies  Allergen Reactions   Hctz [Hydrochlorothiazide] Other (See Comments)    Gout flare    Lexapro [Escitalopram Oxalate]     Ineffective for anxiety    Lisinopril     angioedema    Silodosin Diarrhea   Tamsulosin Other (See Comments)    visual changes    Narda Bonds, PharmD, BCPS Clinical Pharmacist Phone: [phone]

## 2021-06-28 NOTE — Evaluation (Signed)
Clinical/Bedside Swallow Evaluation Patient Details  Name: Makya Yurko MRN: [id number] Date of Birth: 1940-06-09  Today's Date: 06/28/2021 Time: SLP Start Time (ACUTE ONLY): 0840 SLP Stop Time (ACUTE ONLY): 0900 SLP Time Calculation (min) (ACUTE ONLY): 20 min  Past Medical History:  Past Medical History:  Diagnosis Date   CKD (chronic kidney disease)    DM type 2 (diabetes mellitus, type 2) ([place])    ED (erectile dysfunction)    Fatty liver    GERD (gastroesophageal reflux disease)    Gout    H/O adenomatous polyp of colon    HLD (hyperlipidemia)    HTN (hypertension)    Hypothyroid    Panic attacks    Parkinson disease ([place])    Psoriasis    Past Surgical History:  Past Surgical History:  Procedure Laterality Date   BOTOX INJECTION  06/05/2021   Procedure: BOTOX INJECTION;  Surgeon: Clarene Essex, MD;  Location: [facility];  Service: Endoscopy;;   ESOPHAGOGASTRODUODENOSCOPY (EGD) WITH PROPOFOL N/A 06/05/2021   Procedure: ESOPHAGOGASTRODUODENOSCOPY (EGD) WITH PROPOFOL;  Surgeon: Clarene Essex, MD;  Location: [facility];  Service: Endoscopy;  Laterality: N/A;  POSSIBLE DIL/POSSIBLE BOTOX   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 male admitted to [facility] on 06/27/2021 with suspected acute metabolic encephalopathy after presenting from home to [facility] ED for evaluation of altered mental status. Pt presented with acute metabolic encephalopathy in the setting of severe sepsis due to suspected left lower lobe pneumonia and complicated by acute kidney injury and generalized weakness, with evidence of an element of dehydration He was recently diagnosed with Parkinson's disease, and started on levodopa carbidopa.  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 Dysmotility also reported. Pt has been fearful of eating since this event.    Assessment / Plan / Recommendation  Clinical Impression  Pt found to be alert with dry mouth, but abl eto follow commands. He consumed ice chips with adequate mastication and swallow response. Pt able to take sips from cup edge and with a straw. He had one instance of cough with thin liquids. Pt tolerating puree without difficulty. Only minimal PO given due to potential plans for tests that may require NPO. Pt is able to take his medication (whole with water or puree) and have sips of liquids. Would recommend a puree diet and thin liqudis if MD wants to advance. Will f/u tomorrow for further interventions as neeed. SLP Visit Diagnosis: Dysphagia, unspecified (R13.10)    Aspiration Risk  Mild aspiration risk    Diet Recommendation Dysphagia 1 (Puree);Thin liquid   Liquid Administration via: Cup;Straw Medication Administration: Whole meds with liquid Supervision: Staff to assist with self feeding Compensations: Slow rate;Small sips/bites Postural Changes: Seated upright at 90 degrees    Other  Recommendations      Recommendations for follow up therapy are one component of a multi-disciplinary discharge planning process, led by the attending physician.  Recommendations may be updated based on patient status, additional functional criteria and insurance authorization.  Follow up Recommendations No SLP follow up      Assistance Recommended at Discharge    Functional Status Assessment Patient has had  a recent decline in their functional status and demonstrates the ability to make significant improvements in function in a reasonable and predictable amount of time.  Frequency and Duration            Prognosis Prognosis for Safe Diet  Advancement: Good      Swallow Study   General HPI: Kameren Pargas is a 81 y.o. male admitted to [facility] on 06/27/2021 with suspected acute metabolic encephalopathy after presenting from home to [facility] ED for evaluation of altered mental status. Pt presented with acute metabolic encephalopathy in the setting of severe sepsis due to suspected left lower lobe pneumonia and complicated by acute kidney injury and generalized weakness, with evidence of an element of dehydration He was recently diagnosed with Parkinson's disease, and started on levodopa carbidopa.  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 Dysmotility also reported. Pt has been fearful of eating since this event. Type of Study: Bedside Swallow Evaluation Diet Prior to this Study: NPO Temperature Spikes Noted: No History of Recent Intubation: No Behavior/Cognition: Alert;Cooperative;Pleasant mood Oral Cavity Assessment: Dry Oral Care Completed by SLP: No Oral Cavity - Dentition: Poor condition;Missing dentition Vision: Functional for self-feeding Self-Feeding Abilities: Needs assist Patient Positioning: Upright in bed Baseline Vocal Quality: Normal Volitional Cough: Strong Volitional Swallow: Able to elicit    Oral/Motor/Sensory Function     Ice Chips Ice chips: Within functional limits   Thin Liquid Thin Liquid: Impaired Presentation: Spoon;Cup;Straw Pharyngeal  Phase Impairments: Cough - Immediate    Nectar Thick Nectar Thick Liquid: Not tested   Honey Thick Honey Thick Liquid: Not tested   Puree Puree: Within functional limits   Solid     Solid: Not tested      Jaira Canady, Katherene Ponto 06/28/2021,9:57 AM

## 2021-06-28 NOTE — Evaluation (Signed)
Occupational Therapy Evaluation Patient Details Name: [name] MRN: [id number] DOB: [birth year] Today's Date: 06/28/2021   History of Present Illness Pt is a 81 y/o male admitted 1/25 with AMS. Chest xray with L lower lobe opacity concerning for pneumonia, AKI. PMH includes: CKD, DM 2, HTN, Parkinsons disease, pacemaker.   Clinical Impression   Patient admitted for above and limited by problem list below, including lethargy, generalized weakness, decreased activity tolerance, and impaired cognition.  Limited evaluation due to lethargy, but patient disoriented to self, follows simple 1 step commands with increased time.  He requires total assist +2 for long sitting in bed, total assist for self care.  She will benefit from continued OT services while admitted and after dc at SNF level to decrease burden of care and optimize independence with ADLs. Will follow acutely.      Recommendations for follow up therapy are one component of a multi-disciplinary discharge planning process, led by the attending physician.  Recommendations may be updated based on patient status, additional functional criteria and insurance authorization.   Follow Up Recommendations  Skilled nursing-short term rehab (<3 hours/day)    Assistance Recommended at Discharge Frequent or constant Supervision/Assistance  Patient can return home with the following Two people to help with walking and/or transfers;Two people to help with bathing/dressing/bathroom;Assistance with cooking/housework;Assistance with feeding;Direct supervision/assist for medications management;Direct supervision/assist for financial management;Assist for transportation;Help with stairs or ramp for entrance    Functional Status Assessment  Patient has had a recent decline in their functional status and demonstrates the ability to make significant improvements in function in a reasonable and predictable amount of time.  Equipment Recommendations  Other  (comment) (TBD)    Recommendations for Other Services       Precautions / Restrictions Precautions Precautions: Fall Restrictions Weight Bearing Restrictions: No      Mobility Bed Mobility Overal bed mobility: Needs Assistance Bed Mobility: Supine to Sit     Supine to sit: Total assist, +2 for physical assistance, HOB elevated, +2 for safety/equipment     General bed mobility comments: pt reposition attempted on stretcher, utilized sheet to transition to long sitting with total assist +2.    Transfers                          Balance                                           ADL either performed or assessed with clinical judgement   ADL Overall ADL's : Needs assistance/impaired                                     Functional mobility during ADLs: Total assistance;+2 for physical assistance;+2 for safety/equipment General ADL Comments: total assist for all self care at this time     Vision   Additional Comments: further assessment required- pt able to open eyes once sitting upright but does not maintain eyes open     Perception     Praxis      Pertinent Vitals/Pain Pain Assessment Pain Assessment: Faces Faces Pain Scale: No hurt Pain Intervention(s): Monitored during session     Hand Dominance     Extremity/Trunk Assessment Upper Extremity Assessment Upper Extremity Assessment: Difficult to assess due to impaired  cognition;Generalized weakness;RUE deficits/detail RUE Deficits / Details: noted R hand flexion contracture, per chart review this is recent   Lower Extremity Assessment Lower Extremity Assessment: Defer to PT evaluation       Communication Communication Communication: No difficulties (difficulty to assess but assume due to lethargy)   Cognition Arousal/Alertness: Lethargic Behavior During Therapy: Flat affect Overall Cognitive Status: Difficult to assess                                  General Comments: pt able to follow simple commands with increased time, disoriented (unable to select correct month for his birthday). Lethargic and unable to maintain eyes open.     General Comments       Exercises     Shoulder Instructions      Home Living Family/patient expects to be discharged to:: Private residence Living Arrangements: Spouse/significant other                               Additional Comments: pt unable to report- will need further information      Prior Functioning/Environment Prior Level of Function : Needs assist;Patient poor historian/Family not available             Mobility Comments: per chart used RW for short distances but recently requiring increased assist to stand and unable to walk ADLs Comments: per chart needing assist for ADLs recently        OT Problem List: Decreased strength;Decreased range of motion;Decreased activity tolerance;Impaired balance (sitting and/or standing);Impaired vision/perception;Decreased coordination;Decreased cognition;Decreased safety awareness;Decreased knowledge of precautions;Decreased knowledge of use of DME or AE;Obesity;Impaired UE functional use;Impaired tone      OT Treatment/Interventions: Self-care/ADL training;Therapeutic exercise;Neuromuscular education;DME and/or AE instruction;Therapeutic activities;Cognitive remediation/compensation;Visual/perceptual remediation/compensation;Patient/family education;Balance training;Splinting;Manual therapy    OT Goals(Current goals can be found in the care plan section) Acute Rehab OT Goals Patient Stated Goal: none stated OT Goal Formulation: Patient unable to participate in goal setting Time For Goal Achievement: 07/12/21 Potential to Achieve Goals: Fair  OT Frequency: Min 2X/week    Co-evaluation PT/OT/SLP Co-Evaluation/Treatment: Yes Reason for Co-Treatment: Complexity of the patient's impairments (multi-system involvement);Necessary  to address cognition/behavior during functional activity;For patient/therapist safety;To address functional/ADL transfers   OT goals addressed during session: ADL's and self-care      AM-PAC OT "6 Clicks" Daily Activity     Outcome Measure Help from another person eating meals?: Total Help from another person taking care of personal grooming?: Total Help from another person toileting, which includes using toliet, bedpan, or urinal?: Total Help from another person bathing (including washing, rinsing, drying)?: Total Help from another person to put on and taking off regular upper body clothing?: Total Help from another person to put on and taking off regular lower body clothing?: Total 6 Click Score: 6   End of Session Nurse Communication: Mobility status  Activity Tolerance: Patient limited by lethargy Patient left: in bed;with call bell/phone within reach  OT Visit Diagnosis: Other abnormalities of gait and mobility (R26.89);Muscle weakness (generalized) (M62.81);Other symptoms and signs involving cognitive function                Time: 0960-4540 OT Time Calculation (min): 14 min Charges:  OT General Charges $OT Visit: 1 Visit OT Evaluation $OT Eval Moderate Complexity: 1 Mod  Jolaine Artist, OT Acute Rehabilitation Services Pager [phone] [place]  06/28/2021, 12:09 PM

## 2021-06-28 NOTE — Consult Note (Addendum)
Neurology Consultation Reason for Consult: Increased rigidity Referring Physician: Ralene Bathe, E  CC: Increased rigidity  History is obtained from: Patient, daughter, wife  HPI: [name] is a 81 y.o. male with a history of panic disorder who has had a tremor and worsening gait for several years.  He has been on Gabitril for his panic disorder, as well as benzodiazepines initially Xanax and subsequently Ativan, and was recently started on Sinemet.  He was evaluated by Dr. Carles Collet who felt that he likely had some degree of parkinsonism, and started him on Sinemet.  At the same time, he stopped Ativan and had his Gabitril increased.  Over the following week, he had progressive increase in his muscle stiffness, tremor and speech difficulty.  This progressed to the point that he was having trouble swallowing and speaking and he therefore presented to the emergency department.  They noticed no improvement with Sinemet.  On arrival, it is noted that he was markedly tremulous, borderline febrile and he was therefore given antibiotics and fluids.  He was having significant anxiety, and the family had been trying to avoid benzodiazepines and therefore he was given Haldol.  Family reports significant improvement after Haldol, with clearer speech.  He also reported that his movements appear to be improved as well.  He is currently taking gabatril 4mg  tabs, 4 in the AM and 3 at night.   Past Medical History:  Diagnosis Date   CKD (chronic kidney disease)    DM type 2 (diabetes mellitus, type 2) ([facility])    ED (erectile dysfunction)    Fatty liver    GERD (gastroesophageal reflux disease)    Gout    H/O adenomatous polyp of colon    HLD (hyperlipidemia)    HTN (hypertension)    Hypothyroid    Panic attacks    Psoriasis      Family History  Problem Relation Age of Onset   Hypertension Mother    Cancer Sister    Aneurysm Sister      Social History:  reports that he has never smoked. He has never used  smokeless tobacco. He reports that he does not drink alcohol and does not use drugs.   Exam: Current vital signs: BP 110/63    Pulse (!) 117    Temp 99.5 F (37.5 C) (Rectal)    Resp 17    SpO2 100%  Vital signs in last 24 hours: Temp:  [99.5 F (37.5 C)-99.7 F (37.6 C)] 99.5 F (37.5 C) (01/25 2343) Pulse Rate:  [61-117] 117 (01/25 2330) Resp:  [16-18] 17 (01/25 2330) BP: (110-157)/(63-122) 110/63 (01/25 2330) SpO2:  [96 %-100 %] 100 % (01/25 2330)   Physical Exam  Constitutional: Appears obese Psych: Affect appropriate to situation Eyes: No scleral injection HENT: No OP obstruction MSK: no joint deformities.  Cardiovascular: Normal rate and regular rhythm.  Respiratory: Effort normal, non-labored breathing GI: Soft.  There is no tenderness.  Skin: He has multiple rasied red areas,?  Psoriasis  Neuro: Mental Status: Patient is awake, alert, oriented to person, place, month, year, and situation.  Able to spell world backwards without difficulty. No signs of aphasia or neglect He is somewhat dysarthric. Cranial Nerves: II: Visual Fields are full. Pupils are equal, round, and reactive to light.   III,IV, VI: EOMI without ptosis or diploplia.  V: Facial sensation is symmetric to temperature VII: He has masked facies, it is symmetric Motor: Tone is increased, right arm is held flexed at both elbow and wrists,  but he is able to open his hand after a couple of tries, 4/5 left arm. He is barely able to lift both legs against gravity He has fasciculations in his legs.  Sensory: Sensation is symmetric to light touch and temperature in the arms and legs. Deep Tendon Reflexes: 2+ and symmetric in the patellae.  Plantars: Bilateral striatal toes.  Cerebellar: Unable to perform.     I have reviewed labs in epic and the results pertinent to this consultation are: Creatinine 2.51, increased from 1.64 on 12/23 WBC 11.9 Lactate 4.4  I have reviewed the images obtained: CT  head-unremarkable  Impression: 81 year old male with progressive increased tone, gait disorder, worsening panic disorder, tremor.  I think that idiopathic Parkinson's disease is less likely given his lack of response to Sinemet (though still on a starter dose) as well as his lack of worsening with Haldol.  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  As per his acute worsening, he had three medication changes at approximately the same time, he stopped Ativan (had been weaned down over the course of a month and was 0.5 mg daily), he increased his Gabitril from 12 mg twice daily to 12 mg - 16 mg, and he started Sinemet 25/100 3 times daily.  Gabitril can cause myoclonus.  He stopped Sinemet on Monday, but has not improved.  Recommendations: 1) Ativan 1 mg x 1 to assess for response 2) decrease Gabitril to 8 mg twice daily for tonight and tomorrow morning, then consider increasing back to 12 mg twice daily depending on his clinical course 3) continue holding Sinemet 4) speech evaluation 5) work-up for possible infectious agents per internal medicine 6) tiagabine level, antigad ab 6) neurology will continue to follow   Roland Rack, MD [facility] [phone]  If 7pm- 7am, please page neurology on call as listed in [place].

## 2021-06-28 NOTE — Assessment & Plan Note (Addendum)
Significant functional decline over the past 1 year-at baseline using cane/walker-however due to acute illness-nonambulatory for almost a week.  On exam-has generalized weakness but is nonfocal.  Continues to have tremors.  Suspicion for neurodegenerative issue per neurology-work-up in progress-see above.  Evaluated by PT/OT-recommendations are for SNF.

## 2021-06-28 NOTE — Telephone Encounter (Signed)
Spoke with Dr. Rory Percy about care.  Appreciate care coordination.  Looked at Dr. Cecil Cobbs notes as well.  Agree with no more levodopa (had talked with family previous re: risk) but also wonder if holding the ativan was the instigator of sx's as well.  Patient not able to swallow.  Asked Dr. Rory Percy if patient had any fasciculations in tongue, as I noticed that Dr. Leonel Ramsay noticed them in legs/calves (fairly common place).  Dr. Rory Percy had not yet seen the patient, but was aware that patient could not swallow currently.  Patient reportedly got better after Haldol, but not good enough to be able to swallow per his reports.  Discussed with him that I thought patient needed sedated MRI, if family agreeable.  We also discussed potentially doing lumbar puncture while sedated, looking for autoimmune and infectious etiologies.  Patient's history is very confusing because of longstanding panic attacks/psychiatric history, and it is very difficult to tell the exact timeline for onset of neurologic symptoms.  Dr. Rory Percy agrees and is going to discuss with family if they are agreeable to sedated testing.  He is also going to put the patient back on the Ativan.

## 2021-06-28 NOTE — Hospital Course (Addendum)
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  He was subsequently admitted to the hospitalist service.  See below for further details.  Significant events: 1/25>> admit to [facility] for encephalopathy/PNA/sepsis/AKI.  Significant imaging studies: 1/25>> CXR: LLL PNA 1/26>> CT head: No acute pathology  Culture data: 1/25>> blood culture: No growth 1/25>> COVID/flu PCR: Negative

## 2021-06-28 NOTE — H&P (Signed)
History and Physical    PLEASE NOTE THAT DRAGON DICTATION SOFTWARE WAS USED IN THE CONSTRUCTION OF THIS NOTE.   [name] EFE:[id number] DOB: 07/09/1940 DOA: 06/27/2021  PCP: [name], MD  Patient coming from: home   I have personally briefly reviewed patient's old medical records in [place]  Chief Complaint: Altered mental status  HPI: [name] is a 81 y.o. 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   In the setting of the patient's presenting altered mental status, the following history is provided via my discussions with the EDP as well as via chart review.  The patient presents for evaluation of 3 to 4 days of progressive confusion relative to his baseline mental status.  This is also been associated with generalized weakness, disqualified by the patient experiencing difficulty with ambulation relative to his baseline ambulatory abilities.  He is able to walk with the aid of a walker, but without additional support or assistance, including requiring assistance with transfers at baseline.  No reported trauma.  Of note, he was recently diagnosed with Parkinson's disease, and started on levodopa carbidopa.  He also reportedly recently underwent several additional pharmacologic changes as an outpatient, including discontinuation of home Paxil, discontinuation of home Ativan.   Per chart review, he has history of CKD 3B associated baseline creatinine range 1.6-2.0, most recent prior serum creatinine data point  noted to be 1.64 on 05/25/2021.     ED Course:  Vital signs in the ED were notable for the following: Temperature max 99.7; initial heart rate 119, which decreased to 94 following interval administration of IV fluids, as further quantified below; blood pressure 110/63 - 117/77; respiratory rate 16-18, oxygen saturation 96 to 1% on room air.  Labs were notable for the following: CMP notable for the following: Sodium 137, bicarbonate 22, anion gap 15, creatinine 2.51, BUN to creatinine ratio 21.9, glucose 155.  Total CPK 435.  CBC notable for a blood cell count 11.9 with 82% neutrophils.  TSH 1.345.  Urinalysis has been ordered, with result currently pending.  COVID-19/[place] PCR negative.  Blood cultures x2 were collected prior to initiation of broad-spectrum IV antibiotics.  Imaging and additional notable ED work-up: CT head showed no evidence of acute intracranial process, including no evidence of intracranial hemorrhage.  Chest x-ray showed interval development of left lower lobe airspace opacity concerning for pneumonia in the absence of any evidence of edema or pneumothorax.  While in the ED, the following were administered: Haldol 2 mg IM x1, Ativan 1 mg IV x1, 3.5 L LR bolus followed by initiation of continuous LR at 75 cc/h, cefepime, IV vancomycin, IV Flagyl.  EDP discussed patient's case with on-call neurologist,Dr. Leonel Ramsay, who will formally consult for additional recommendations regarding modifications to existing pharmacologic regimen as it relates to outpatient Parkinson's regimen.  Subsequently, the patient was admitted to med telemetry for further evaluation management of suspected presenting acute metabolic encephalopathy in the setting of severe sepsis due to suspected left lower lobe pneumonia and complicated by acute kidney injury and generalized weakness, with evidence of an element of dehydration.     Review of Systems: As per HPI  otherwise 10 point review of systems  negative.   Past Medical History:  Diagnosis Date   CKD (chronic kidney disease)    DM type 2 (diabetes mellitus, type 2) (HCC)    ED (erectile dysfunction)    Fatty liver    GERD (gastroesophageal reflux disease)    Gout    H/O adenomatous polyp of colon    HLD (hyperlipidemia)    HTN (hypertension)    Hypothyroid    Panic attacks    Parkinson disease ([place])    Psoriasis     Past Surgical History:  Procedure Laterality Date   BOTOX INJECTION  06/05/2021   Procedure: BOTOX INJECTION;  Surgeon: Clarene Essex, MD;  Location: [facility];  Service: Endoscopy;;   ESOPHAGOGASTRODUODENOSCOPY (EGD) WITH PROPOFOL N/A 06/05/2021   Procedure: ESOPHAGOGASTRODUODENOSCOPY (EGD) WITH PROPOFOL;  Surgeon: Clarene Essex, MD;  Location: [facility];  Service: Endoscopy;  Laterality: N/A;  POSSIBLE DIL/POSSIBLE BOTOX   PACEMAKER IMPLANT N/A 11/24/2017   Procedure: PACEMAKER IMPLANT;  Surgeon: Deboraha Sprang, MD;  Location: [place];  Service: Cardiovascular;  Laterality: N/A;    Social History:  reports that he has never smoked. He has never used smokeless tobacco. He reports that he does not drink alcohol and does not use drugs.   Allergies  Allergen Reactions   Hctz [Hydrochlorothiazide] Other (See Comments)    Gout flare    Lexapro [Escitalopram Oxalate]     Ineffective for anxiety    Lisinopril     angioedema    Silodosin Diarrhea   Tamsulosin Other (See Comments)    visual changes    Family History  Problem Relation Age of Onset   Hypertension Mother    Cancer Sister    Aneurysm Sister     Family history reviewed and not pertinent    Prior to Admission medications   Medication Sig Start Date End Date Taking? Authorizing Provider  ACCU-CHEK SMARTVIEW test strip  12/23/19   [provider]  apixaban (ELIQUIS) 5 MG TABS tablet Take 1 tablet (5 mg total) by mouth 2 (two) times daily. 03/19/21   Supple, Megan E, RPH-CPP  carbidopa-levodopa (SINEMET IR) 25-100 MG  tablet Take 1 tablet by mouth 3 (three) times daily. 7am/11am/4pm 06/14/21   Tat, Eustace Quail, DO  levothyroxine (SYNTHROID) 137 MCG tablet Take 137 mcg by mouth daily before breakfast. 05/07/21   [provider]  LORazepam (ATIVAN) 1 MG tablet Take 0.5-1 mg by mouth See admin instructions. Take 0.5-1 tablet (0.5-1 mg) by mouth in the morning, take 0.5 tablet (0.5 mg) by mouth in the afternoon & take 0.5 tablet (0.5 mg) by mouth in the evening.    [provider]  losartan (COZAAR) 50 MG tablet Take 1 tablet (50 mg total) by mouth daily. Patient taking differently: Take 50 mg by mouth every evening. 03/03/18   Deboraha Sprang, MD  melatonin 5 MG TABS Take 5 mg by mouth at bedtime.    [provider]  pantoprazole (PROTONIX) 40 MG tablet Take 40 mg by mouth daily. 06/05/21   [provider]  PARoxetine (PAXIL) 40 MG tablet Take 40 mg by mouth daily.    [provider]  RESTASIS 0.05 % ophthalmic emulsion Place 1 drop into both eyes 2 (two) times daily as needed (dry eyes). 06/09/19   [provider]  rosuvastatin (CRESTOR) 10 MG tablet Take 1 tablet (10 mg total) by mouth daily. 03/19/21   Supple, Megan E, RPH-CPP  tiaGABine (GABITRIL)  4 MG tablet Take 10 mg by mouth 2 (two) times daily. 3 tablets BID    [provider]  torsemide (DEMADEX) 20 MG tablet Take 1 tablet (20 mg total) by mouth as needed (swelling). Patient taking differently: Take 20 mg by mouth daily as needed (swelling). 05/04/21   Troy Sine, MD  traZODone (DESYREL) 50 MG tablet Take 50 mg by mouth at bedtime. 05/16/21   [provider]     Objective    Physical Exam: Vitals:   06/27/21 2325 06/27/21 2330 06/27/21 2343 06/28/21 0305  BP: 117/77 110/63  116/76  Pulse:  (!) 117  94  Resp: _0 Temp:   99.5 F (37.5 C)   TempSrc:   Rectal   SpO2:  100%  100%    General: appears to be stated age; confused  skin: warm, dry, no rash Head:   AT/[place] Mouth:  Oral mucosa membranes appear moist, normal dentition Neck: supple; trachea midline Heart:  RRR; did not appreciate any M/R/G Lungs: CTAB, did not appreciate any wheezes, rales, or rhonchi Abdomen: + BS; soft, ND, NT Vascular: 2+ pedal pulses b/l; 2+ radial pulses b/l Extremities: no peripheral edema, no muscle wasting Neuro:  In the setting of the patient's current mental status and associated inability to follow instructions, unable to perform full neurologic exam at this time.  As such, assessment of strength, sensation, and cranial nerves is limited at this time. Patient noted to spontaneously move all 4 extremities.     Labs on Admission: I have personally reviewed following labs and imaging studies  CBC: Recent Labs  Lab 06/27/21 2157  WBC 11.9*  NEUTROABS 9.9*  HGB 15.6  HCT 48.7  MCV 90.4  PLT 280   Basic Metabolic Panel: Recent Labs  Lab 06/27/21 2157  NA 137  K 5.3*  CL 100  CO2 22  GLUCOSE 155*  BUN 55*  CREATININE 2.51*  CALCIUM 10.4*   GFR: Estimated Creatinine Clearance: 34 mL/min (A) (by C-G formula based on SCr of 2.51 mg/dL (H)). Liver Function Tests: Recent Labs  Lab 06/27/21 2157  AST 35  ALT 19  ALKPHOS 75  BILITOT 2.7*  PROT 6.9  ALBUMIN 3.8   No results for input(s): LIPASE, AMYLASE in the last 168 hours. No results for input(s): AMMONIA in the last 168 hours. Coagulation Profile: Recent Labs  Lab 06/27/21 2157  INR 1.5*   Cardiac Enzymes: Recent Labs  Lab 06/27/21 2315  CKTOTAL 435*   BNP (last 3 results) No results for input(s): PROBNP in the last 8760 hours. HbA1C: No results for input(s): HGBA1C in the last 72 hours. CBG: No results for input(s): GLUCAP in the last 168 hours. Lipid Profile: No results for input(s): CHOL, HDL, LDLCALC, TRIG, CHOLHDL, LDLDIRECT in the last 72 hours. Thyroid Function Tests: Recent Labs    06/27/21 2315  TSH 1.345   Anemia Panel: No results for input(s): VITAMINB12,  FOLATE, FERRITIN, TIBC, IRON, RETICCTPCT in the last 72 hours. Urine analysis:    Component Value Date/Time   COLORURINE AMBER (A) 06/28/2021 0422   APPEARANCEUR HAZY (A) 06/28/2021 0422   LABSPEC 1.023 06/28/2021 0422   PHURINE 5.0 06/28/2021 0422   GLUCOSEU NEGATIVE 06/28/2021 0422   HGBUR SMALL (A) 06/28/2021 0422   BILIRUBINUR NEGATIVE 06/28/2021 0422   KETONESUR 5 (A) 06/28/2021 0422   PROTEINUR 30 (A) 06/28/2021 0422   NITRITE NEGATIVE 06/28/2021 0422   LEUKOCYTESUR NEGATIVE 06/28/2021 0422    Radiological  Exams on Admission: CT Head Wo Contrast  Result Date: 06/28/2021 CLINICAL DATA:  Altered mental status. EXAM: CT HEAD WITHOUT CONTRAST TECHNIQUE: Contiguous axial images were obtained from the base of the skull through the vertex without intravenous contrast. RADIATION DOSE REDUCTION: This exam was performed according to the departmental dose-optimization program which includes automated exposure control, adjustment of the mA and/or kV according to patient size and/or use of iterative reconstruction technique. COMPARISON:  None. FINDINGS: Brain: Moderate age-related atrophy and chronic microvascular ischemic changes. There is no acute intracranial hemorrhage. No mass effect or midline shift. No extra-axial fluid collection. Vascular: No hyperdense vessel or unexpected calcification. Skull: Normal. Negative for fracture or focal lesion. Sinuses/Orbits: No acute finding. Other: None IMPRESSION: 1. No acute intracranial pathology. 2. Moderate age-related atrophy and chronic microvascular ischemic changes. Electronically Signed   By: Anner Crete M.D.   On: 06/28/2021 03:02   DG Chest Port 1 View  Result Date: 06/27/2021 CLINICAL DATA:  Sepsis workup. EXAM: PORTABLE CHEST 1 VIEW COMPARISON:  PA and lateral chest 04/15/2021 FINDINGS: The heart enlarged but stable. Central vessels are normal caliber. Left chest dual lead pacing system and wire insertions are unchanged. The aorta is  tortuous with ectasia and patchy calcification. Stable mediastinum. There is increased opacity in the left lower lung field concerning for pneumonia or aspiration. Small left pleural effusion may also be forming. The remaining lungs are clear. Mild thoracic spondylosis. Osteopenia. IMPRESSION: Increased opacity in the left lower lobe concerning for aspiration or pneumonia. Question small left pleural effusion. Aortic atherosclerosis. Cardiomegaly. Electronically Signed   By: Telford Nab M.D.   On: 06/27/2021 22:50      Assessment/Plan   Principal Problem:   Acute metabolic encephalopathy Active Problems:   Paroxysmal atrial fibrillation (HCC)   Generalized weakness   Severe sepsis (HCC)   Left lower lobe pneumonia   Lactic acidosis   AKI (acute kidney injury) ([place])   Hypercalcemia   Parkinson disease (HCC)   Hypothyroid      #) Acute metabolic encephalopathy: 3 to 4 days of confusion relative to baseline mental status, as further detailed above. Patient's acute encephalopathy appears to be on the basis of physiologic stressors stemming from presenting severe sepsis d/t suspected left lower lobe pneumonia, with additional metabolic contributions from dehydration, acute kidney injury.  We will also closely monitor ensuing trending CPK given mildly elevated initial value.  No obvious additional contributory underlying infectious process at this time, including negative COVID-19/influenza PCR performed today.  However, urinalysis result is currently pending.   No additional overt metabolic/electrolyte contributions at this time.  There is, however, the possibility of pharmacologic contributions, particular given recent diagnosis of Parkinson's disease with associated recent initiation of levodopa carbidopa as well as multiple other outpatient changes to central acting medications, including recent discontinuation of Paxil as well as recent reduction/elimination of benzodiazepines.  Neurology  has been consulted, did review outpatient regimen of central acting medications, including those relating to recently diagnosed Parkinson's, with associated formal recommendations currently pending.  TSH within normal limits.  No overt acute focal neurologic deficits to suggest a contribution from an underlying acute CVA, and CT head shows no evidence of acute intracranial process, as above. Seizures are also felt to be less likely. Will check VBG to evaluate for any contribution from hypercapnic encephalopathy. Will keep patient NPO until mental status improves sufficiently that patient is able to participate in and pass nursing bedside swallow screen.      Plan: fall  precautions. Repeat CMP/CBC in the AM. Check magnesium level. check VBG, MMA. NPO pending nursing bedside swallow evaluation prior to the initiation of a diet/oral medications, as described above.  Repeat CPK level in the morning.  Check urinalysis, ammonia level, ionized calcium level, urinary drug screen.  Gentle IV fluids, as further detailed below.  Further evaluation management of suspected severe sepsis due to left lower lobe pneumonia, including broad-spectrum IV antibiotics as below.  Neurology consulted, as above.      #) Generalized weakness: 3 to 4 days of generalized weakness in the absence of any acute focal neurologic deficits, resulting in diminished ambulatory abilities relative to baseline ability to ambulate with the aid of a walker but without significant additional assistance, as further detailed above.  Likely multifactorial in nature, with contributions from suspected presenting severe sepsis due to left lower lobe pneumonia, dehydration, and AKI.  Plan: Fall precautions ordered.  Repeat CPK.  Gentle IV fluids.  Physical therapy consult ordered for the morning.  Further evaluation management of suspected severe sepsis due to left lower lobe pneumonia, as above.  Check urinalysis.       #) Severe sepsis due to  left lower lobe pneumonia: SIRS criteria met via presenting leukocytosis, tachycardia, with chest x-ray showing evidence of left lower lobe airspace opacity concerning for infiltrate and suggestive of pneumonia.  Patient sepsis meets criteria to be considered severe in nature in the setting of concomitant evidence of endorgan damage in the form of acute kidney injury, acute encephalopathy.  Additionally, in the setting of initially elevated lactate of 4.4, the patient received a 30 mL/kg IV fluid bolus, with repeat lactate trending down to 2.5.  No evidence of hypotension at this time.  Blood cultures x2 collected prior to initiation of broad-spectrum IV antibiotics in the form of IV vancomycin, cefepime, Flagyl, of which we will continue IV vancomycin and cefepime for now in setting of suspected left lower lobe pneumonia, as above.  No evidence of additional underlying infectious process at this time, although urinalysis result currently pending at this time.  COVID-19/influenza PCR negative.  Plan: Continuous LR at 75 cc/h.  Repeat lactic acid level at 8 AM this morning.  Monitor for results of blood cultures x2.  Continue IV vancomycin/cefepime.  Add on procalcitonin.  Check strep pneumonia urine antigen and MRSA PCR.  Check UA.  Repeat CBC with differential as well as CMP in the morning.       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  Likely multifactorial in nature, with multiple prerenal contributions, including consequence of severe sepsis due to left lobe pneumonia, as above, as well as additional prerenal influence from clinical evidence of dehydration.  We will also closely monitor for ensuing development of rhabdomyolysis given initial mildly elevated CPK level, as quantified above.  Potential pharmacologic contribution in the setting of  home losartan.  Plan: Monitor strict I's and O's Daily weights.  Attempt to avoid nephrotoxic agents.  As such, will hold home losartan for now.  Continuous LR at 75 cc/h.  Repeat BMP in the morning.  Follow-up result urinalysis with microscopy.  Add on random urine sodium as well as random urine creatinine.  Repeat CPK level in the morning.       #) Hypercalcemia: Presenting labs reflect serum calcium level of 10.4.  Suspect element of dehydration, without  overt pharmacologic contribution. Will continue gentle IVF's, as above, with repeat calcium level in the morning, with consideration for further expansion of work-up if no ensuing improvement in serum calcium level following interval IVF administration.     Plan: LR, as above.  Monitor strict I's&O's, daily weights.  CMP in the morning.  Ionized calcium.  Check serum Mg and Phos levels.        #) Parkinson's disease: Recently diagnosed and started on levodopa carbidopa.  Nephrolo neurology consulted, as above, to provide recommendations regarding potential dose adjustments in the setting of presenting   acute encephalopathy, as above.  G plan: For now continue current home dose of levodopa carbidopa.  Neurology consulted for additional recommendations regarding potential dose adjustments, as above.         #) GERD: Documented history of such, on Protonix and outpatient.  In the setting of presenting acute encephalopathy and potential for PPIs to contribute to  encephalopathy, will hold home PPI for now.  Plan: Hold home PPI for now, as above.         #) Essential Hypertension: documented h/o such, with outpatient antihypertensive regimen including losartan.  SBP's in the ED today:  in the 110's mmHg .  In the context of presenting severe sepsis as well as AKI, will hold home losartan for now  Plan: Close monitoring of subsequent BP via routine VS. hold olmesartan for now, as above.         #) Hyperlipidemia:  documented h/o such. On rosuvastatin as outpatient.  In setting of presenting acute encephalopathy as well as mildly elevated CPK level, will hold home statin for now   Plan: Hold home statin.          #) acquired hypothyroidism: documented h/o such, on Synthroid as outpatient.  TSH found to be within normal limits when checked this evening  Plan: cont home Synthroid.          #) Paroxysmal atrial fibrillation: Documented history of such. In setting of CHA2DS2-VASc score of 5, there is an indication for chronic anticoagulation for thromboembolic prophylaxis. Consistent with this, patient is chronically anticoagulated on Eliquis. Home AV nodal blocking regimen: None.  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     Plan: monitor strict I's & O's and daily weights. Repeat BMP/CBC in AM. Check serum mag level. Continue home Eliquis.      DVT prophylaxis: SCD's plus Eliquis Code Status: DNR (has DNR documentation on file) Family Communication: none Disposition Plan: Per Rounding Team Consults called: Neurology consulted, as further detailed above;  Admission status: Inpatient; med telemetry  Warrants inpatient status on basis of need for further evaluation management of acute encephalopathy, including further evaluation management of severe sepsis due to suspected left lower lobe pneumonia, including need for IV antibiotics as well as close monitoring of ensuing response to these measures.   PLEASE NOTE THAT DRAGON DICTATION SOFTWARE WAS USED IN THE CONSTRUCTION OF THIS NOTE.   [place] DO [facility]  From [place]   06/28/2021, 4:36 AM

## 2021-06-28 NOTE — Assessment & Plan Note (Addendum)
AKI likely hemodynamically mediated-improved with supportive care-and close to baseline of between 1.6-1.8.

## 2021-06-28 NOTE — Plan of Care (Signed)
Spoke with patient's daughter and explained rationale for MRI. Would need under GA. Orderd MRI under GA. Will like to get LP when sedated as well. Will touch base with [facility] if they can do that.  -- Amie Portland, MD Neurologist [facility] Pager: [phone]

## 2021-06-28 NOTE — Assessment & Plan Note (Addendum)
Recently started on a trial of Sinemet-however diagnosis of idiopathic Parkinson's disease is not certain per neurology.  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  Given his body habitus-lumbar puncture will be quite challenging.  No longer on Sinemet.  Overall symptoms have improved, discussed with neurology team on 07/01/2021 keep on this current medication regimen and discharged to SNF once bed is available

## 2021-06-28 NOTE — Progress Notes (Signed)
PROGRESS NOTE        PATIENT DETAILS Name: [name] Age: 81 y.o. 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  He was subsequently admitted to the hospitalist service.  See below for further details.  Significant events: 1/25>> admit to [facility] for encephalopathy/PNA/sepsis/AKI.  Significant imaging studies: 1/25>> CXR: LLL PNA 1/26>> CT head: No acute pathology  Culture data: 1/25>> blood culture: Pending 1/25>> COVID/flu PCR: Negative  Subjective: Lying comfortably in bed-denies any chest pain or shortness of breath.  Objective: Vitals: Blood pressure 114/69, pulse 71, temperature 99.5 F (37.5 C), temperature source Rectal, resp. rate 19, SpO2 100 %.   Exam: Gen Exam: Looks weak-frail appearing.  However is awake-alert-although slow to respond at times. HEENT:atraumatic, normocephalic Chest: B/L clear to auscultation anteriorly CVS:S1S2 regular Abdomen:soft non tender, non distended Extremities:no edema Neurology: Right arm flexed at elbow and wrist-tone appears to be increased.  Motor exam is symmetrical-but has generalized weakness. Skin: no rash  Pertinent Labs/Radiology: CBC Latest Ref Rng & Units 06/28/2021 06/28/2021 06/27/2021  WBC 4.0 - 10.5 K/uL - 8.0 11.9(H)  Hemoglobin 13.0 - 17.0 g/dL  11.6(L) 12.2(L) 15.6  Hematocrit 39.0 - 52.0 % 34.0(L) 38.9(L) 48.7  Platelets 150 - 400 K/uL - 139(L) 231    Lab Results  Component Value Date   NA 135 06/28/2021   K 4.0 06/28/2021   CL 108 06/28/2021   CO2 18 (L) 06/28/2021      Assessment/Plan: * Acute metabolic encephalopathy- (present on admission) Multifactorial etiology-due to PNA/AKI-in from multiple medication changes.  Seems to be clinically improved compared to yesterday-more awake and alert per family.  Continue antibiotics-follow renal function.  After discussion with neurology-restarted on benzodiazepines-have stopped Sinemet.  Neurology contemplating MRI brain/lumbar puncture at some point in time.  ?  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  It is likely that he probably has some form of underlying neuro degenerative disorder-neurology contemplating MRI brain and CSF studies.  We will defer further to neurology.  Sinemet has been discontinued after discussion with neurology.    Severe sepsis due to left lower lobe pneumonia- (present on admission) Sepsis physiology has resolved-negative date-suspect that this is aspiration PNA.  Stop vancomycin/cefepime-we will start Unasyn-plan on 5 days of treatment with antibiotics.  Hypomagnesemia Replete and recheck.  AKI on CKD stage IIIb.- (present on admission) AKI likely hemodynamically mediated-improving with supportive care.  Avoid nephrotoxic agents-follow intake/output-if no improvement-we can contemplate further work-up.  Hypercalcemia- (present on admission) Due to dehydration-resolved.  Chronic HFrEF (heart failure with reduced ejection fraction) (EF 45-50% by echo on 03/23/2021)- (present on admission) Volume status is stable-resume Demadex over the next few days.  Paroxysmal atrial fibrillation ([place])- (present on admission) Paced  rhythm-but rate controlled.  On Eliquis.  Continue telemetry monitoring.  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  HTN (hypertension)- (present on admission) BP stable-continue to hold all antihypertensive medications-resume when able.  Generalized weakness Significant functional decline over the past 1 year-at baseline using cane/walker-however due to acute illness-nonambulatory for almost a week.  On exam-has generalized weakness but is nonfocal.  Has tremors.  Await PT/OT eval.  Hypothyroidism-TSH 1.345 on 1/23- (present on admission) Continue levothyroxine.  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 Appreciate SLP input-starting dysphagia 1 diet thin liquids.  Watch for signs of aspiration.  OSA (obstructive sleep apnea)- (present on admission) Noncompliant to BiPAP per family.   BMI: Estimated body mass index is 40.55 kg/m as calculated from the following:   Height as of 06/14/21: 6' (1.829 m).   Weight as of 06/14/21: 135.6 kg.    DVT Prophylaxis: Eliquis Procedures: None Consults: Neurology Code Status:DNR Family Communication: Spouse/daughter at bedside.   Disposition Plan: Status is: Inpatient  Remains inpatient appropriate because: Resolving sepsis-encephalopathy-not yet stable for discharge.  See above.    Diet: Diet Order             DIET - DYS 1 Room service appropriate? Yes; Fluid consistency: Thin; Fluid restriction: 1500 mL Fluid  Diet effective now                     Antimicrobial agents: Anti-infectives (From admission, onward)    Start     Dose/Rate Route Frequency Ordered Stop   06/28/21 2200  vancomycin (VANCOREADY) IVPB 750 mg/150 mL  Status:  Discontinued        750 mg 150 mL/hr over 60 Minutes Intravenous Every 24 hours 06/28/21 0012 06/28/21 0926   06/28/21 2200  vancomycin (VANCOCIN) IVPB 1000 mg/200 mL premix  Status:  Discontinued        1,000 mg 200  mL/hr over 60 Minutes Intravenous Every 24 hours 06/28/21 0926 06/28/21 1355   06/28/21 1000  ceFEPIme (MAXIPIME) 2 g in sodium chloride 0.9 % 100 mL IVPB  Status:  Discontinued        2 g 200 mL/hr over 30 Minutes Intravenous Every 12 hours 06/28/21 0007 06/28/21 1355   06/27/21 2330  ceFEPIme (MAXIPIME) 2 g in sodium chloride 0.9 % 100 mL IVPB        2 g 200 mL/hr over 30 Minutes Intravenous  Once 06/27/21 2321 06/28/21 0236   06/27/21 2330  metroNIDAZOLE (FLAGYL) IVPB 500 mg        500 mg 100 mL/hr over 60 Minutes Intravenous  Once 06/27/21 2321 06/28/21 0236   06/27/21 2330  vancomycin (VANCOCIN) IVPB 1000 mg/200 mL premix  Status:  Discontinued        1,000 mg 200 mL/hr over 60 Minutes Intravenous  Once 06/27/21 2321 06/27/21 2323   06/27/21 2330  vancomycin (VANCOREADY) IVPB 2000 mg/400 mL        2,000 mg 200 mL/hr over 120 Minutes Intravenous  Once 06/27/21 2323 06/28/21 0347        MEDICATIONS: Scheduled Meds:  apixaban  5 mg Oral BID   levothyroxine  137 mcg Oral QAC breakfast   LORazepam  0.5 mg Intravenous Q12H   pantoprazole  40 mg Oral Q1200   rosuvastatin  10 mg Oral Daily   tiaGABine  8 mg Oral BID   Continuous Infusions:  lactated ringers 75 mL/hr at 06/28/21 0619   magnesium sulfate bolus IVPB 2 g (06/28/21 1311)  PRN Meds:.acetaminophen **OR** acetaminophen   I have personally reviewed following labs and imaging studies  LABORATORY DATA: CBC: Recent Labs  Lab 06/27/21 2157 06/28/21 0458 06/28/21 0509  WBC 11.9* 8.0  --   NEUTROABS 9.9* 6.8  --   HGB 15.6 12.2* 11.6*  HCT 48.7 38.9* 34.0*  MCV 90.4 92.2  --   PLT 231 139*  --     Basic Metabolic Panel: Recent Labs  Lab 06/27/21 2157 06/28/21 0458 06/28/21 0509  NA 137 136 135  K 5.3* 4.2 4.0  CL 100 108  --   CO2 22 18*  --   GLUCOSE 155* 100*  --   BUN 55* 51*  --   CREATININE 2.51* 2.01*  --   CALCIUM 10.4* 8.9  --   MG  --  1.6*  --     GFR: CrCl cannot be calculated  (Unknown ideal weight.).  Liver Function Tests: Recent Labs  Lab 06/27/21 2157 06/28/21 0458  AST 35 32  ALT 19 15  ALKPHOS 75 56  BILITOT 2.7* 1.9*  PROT 6.9 4.8*  ALBUMIN 3.8 2.8*   No results for input(s): LIPASE, AMYLASE in the last 168 hours. Recent Labs  Lab 06/28/21 0458  AMMONIA 36*    Coagulation Profile: Recent Labs  Lab 06/27/21 2157  INR 1.5*    Cardiac Enzymes: Recent Labs  Lab 06/27/21 2315 06/28/21 0458  CKTOTAL 435* 494*    BNP (last 3 results) No results for input(s): PROBNP in the last 8760 hours.  Lipid Profile: No results for input(s): CHOL, HDL, LDLCALC, TRIG, CHOLHDL, LDLDIRECT in the last 72 hours.  Thyroid Function Tests: Recent Labs    06/27/21 2315  TSH 1.345    Anemia Panel: No results for input(s): VITAMINB12, FOLATE, FERRITIN, TIBC, IRON, RETICCTPCT in the last 72 hours.  Urine analysis:    Component Value Date/Time   COLORURINE AMBER (A) 06/28/2021 0422   APPEARANCEUR HAZY (A) 06/28/2021 0422   LABSPEC 1.023 06/28/2021 0422   PHURINE 5.0 06/28/2021 0422   GLUCOSEU NEGATIVE 06/28/2021 0422   HGBUR SMALL (A) 06/28/2021 0422   BILIRUBINUR NEGATIVE 06/28/2021 0422   KETONESUR 5 (A) 06/28/2021 0422   PROTEINUR 30 (A) 06/28/2021 0422   NITRITE NEGATIVE 06/28/2021 0422   LEUKOCYTESUR NEGATIVE 06/28/2021 0422    Sepsis Labs: Lactic Acid, Venous    Component Value Date/Time   LATICACIDVEN 2.5 ([place]) 06/27/2021 2315    MICROBIOLOGY: Recent Results (from the past 240 hour(s))  Blood Culture (routine x 2)     Status: None (Preliminary result)   Collection Time: 06/27/21  9:57 PM   Specimen: BLOOD LEFT ARM  Result Value Ref Range Status   Specimen Description BLOOD LEFT ARM  Final   Special Requests   Final    BOTTLES DRAWN AEROBIC AND ANAEROBIC Blood Culture adequate volume   Culture   Final    NO GROWTH < 12 HOURS Performed at [facility], [place] [address] [place], [place] [address]    Report Status PENDING   Incomplete  Blood Culture (routine x 2)     Status: None (Preliminary result)   Collection Time: 06/27/21  9:57 PM   Specimen: BLOOD LEFT HAND  Result Value Ref Range Status   Specimen Description BLOOD LEFT HAND  Final   Special Requests AEROBIC BOTTLE ONLY Blood Culture adequate volume  Final   Culture   Final    NO GROWTH < 12 HOURS Performed at [facility],  [address], [place], [place] [address]    Report Status PENDING  Incomplete  Resp Panel by RT-PCR (Flu A&B, Covid) Nasopharyngeal Swab     Status: None   Collection Time: 06/27/21 10:02 PM   Specimen: Nasopharyngeal Swab; Nasopharyngeal(NP) swabs in vial transport medium  Result Value Ref Range Status   SARS Coronavirus 2 by RT PCR NEGATIVE NEGATIVE Final    Comment: (NOTE) SARS-CoV-2 target nucleic acids are NOT DETECTED.  The SARS-CoV-2 RNA is generally detectable in upper respiratory specimens during the acute phase of infection. The lowest concentration of SARS-CoV-2 viral copies this assay can detect is 138 copies/mL. A negative result does not preclude SARS-Cov-2 infection and should not be used as the sole basis for treatment or other patient management decisions. A negative result may occur with  improper specimen collection/handling, submission of specimen other than nasopharyngeal swab, presence of viral mutation(s) within the areas targeted by this assay, and inadequate number of viral copies(<138 copies/mL). A negative result must be combined with clinical observations, patient history, and epidemiological information. The expected result is Negative.  Fact Sheet for Patients:  EntrepreneurPulse.com.au  Fact Sheet for Healthcare Providers:  IncredibleEmployment.be  This test is no t yet approved or cleared by the [place] FDA and  has been authorized for detection and/or diagnosis of SARS-CoV-2 by FDA under an Emergency Use Authorization (EUA). This EUA  will remain  in effect (meaning this test can be used) for the duration of the COVID-19 declaration under Section 564(b)(1) of the Act, 21 U.S.C.section 360bbb-3(b)(1), unless the authorization is terminated  or revoked sooner.       Influenza A by PCR NEGATIVE NEGATIVE Final   Influenza B by PCR NEGATIVE NEGATIVE Final    Comment: (NOTE) The Xpert Xpress SARS-CoV-2/FLU/RSV plus assay is intended as an aid in the diagnosis of influenza from Nasopharyngeal swab specimens and should not be used as a sole basis for treatment. Nasal washings and aspirates are unacceptable for Xpert Xpress SARS-CoV-2/FLU/RSV testing.  Fact Sheet for Patients: EntrepreneurPulse.com.au  Fact Sheet for Healthcare Providers: IncredibleEmployment.be  This test is not yet approved or cleared by the [place] FDA and has been authorized for detection and/or diagnosis of SARS-CoV-2 by FDA under an Emergency Use Authorization (EUA). This EUA will remain in effect (meaning this test can be used) for the duration of the COVID-19 declaration under Section 564(b)(1) of the Act, 21 U.S.C. section 360bbb-3(b)(1), unless the authorization is terminated or revoked.  Performed at [facility], [place] [address] [place], [place] [address]     RADIOLOGY STUDIES/RESULTS: CT Head Wo Contrast  Result Date: 06/28/2021 CLINICAL DATA:  Altered mental status. EXAM: CT HEAD WITHOUT CONTRAST TECHNIQUE: Contiguous axial images were obtained from the base of the skull through the vertex without intravenous contrast. RADIATION DOSE REDUCTION: This exam was performed according to the departmental dose-optimization program which includes automated exposure control, adjustment of the mA and/or kV according to patient size and/or use of iterative reconstruction technique. COMPARISON:  None. FINDINGS: Brain: Moderate age-related atrophy and chronic microvascular ischemic changes. There is no  acute intracranial hemorrhage. No mass effect or midline shift. No extra-axial fluid collection. Vascular: No hyperdense vessel or unexpected calcification. Skull: Normal. Negative for fracture or focal lesion. Sinuses/Orbits: No acute finding. Other: None IMPRESSION: 1. No acute intracranial pathology. 2. Moderate age-related atrophy and chronic microvascular ischemic changes. Electronically Signed   By: Anner Crete M.D.   On: 06/28/2021 03:02   DG Chest  Port 1 View  Result Date: 06/27/2021 CLINICAL DATA:  Sepsis workup. EXAM: PORTABLE CHEST 1 VIEW COMPARISON:  PA and lateral chest 04/15/2021 FINDINGS: The heart enlarged but stable. Central vessels are normal caliber. Left chest dual lead pacing system and wire insertions are unchanged. The aorta is tortuous with ectasia and patchy calcification. Stable mediastinum. There is increased opacity in the left lower lung field concerning for pneumonia or aspiration. Small left pleural effusion may also be forming. The remaining lungs are clear. Mild thoracic spondylosis. Osteopenia. IMPRESSION: Increased opacity in the left lower lobe concerning for aspiration or pneumonia. Question small left pleural effusion. Aortic atherosclerosis. Cardiomegaly. Electronically Signed   By: Telford Nab M.D.   On: 06/27/2021 22:50     LOS: 0 days   Oren Binet, MD  [facility]    To contact the attending provider between 7A-7P or the covering provider during after hours 7P-7A, please log into the web site [email or web address] and access using universal [place] password for that web site. If you do not have the password, please call the hospital operator.  06/28/2021, 2:04 PM

## 2021-06-28 NOTE — Assessment & Plan Note (Addendum)
BP slowly creeping up-we will start low-dose amlodipine-continue to hold losartan due to resolving AKI.

## 2021-06-28 NOTE — Assessment & Plan Note (Addendum)
Multifactorial etiology-due to PNA/AKI-in from multiple medication changes.  Clinically improved-more awake and alert compared to how he first presented to the ED.  Remains on empiric IV Unasyn-restarted on benzodiazepines on 1/26 after discussion with neurology.  No longer on Sinemet.

## 2021-06-28 NOTE — Assessment & Plan Note (Signed)
Continue telemetry monitoring.

## 2021-06-28 NOTE — Assessment & Plan Note (Addendum)
Repleted. °

## 2021-06-28 NOTE — Assessment & Plan Note (Signed)
Appreciate SLP input-starting dysphagia 1 diet thin liquids.  Watch for signs of aspiration.

## 2021-06-28 NOTE — Evaluation (Signed)
Physical Therapy Evaluation Patient Details Name: [name] MRN: [id number] DOB: [birth year] Today's Date: 06/28/2021  History of Present Illness  Pt is a 81 y/o male admitted 1/25 with AMS. Chest xray with L lower lobe opacity concerning for pneumonia, AKI. PMH includes: CKD, DM 2, HTN, Parkinsons disease, pacemaker.  Clinical Impression  Pt admitted secondary to problem above with deficits below. Pt very lethargic and requiring total A to come to long sitting on stretcher. Pt unable to maintain eyes open, but could follow some simple one step commands. Pt was previously able to ambulate short distances using RW. Given new deficits, recommending SNF level therapies. Will continue to follow acutely.        Recommendations for follow up therapy are one component of a multi-disciplinary discharge planning process, led by the attending physician.  Recommendations may be updated based on patient status, additional functional criteria and insurance authorization.  Follow Up Recommendations Skilled nursing-short term rehab (<3 hours/day)    Assistance Recommended at Discharge Frequent or constant Supervision/Assistance  Patient can return home with the following  Two people to help with walking and/or transfers;Two people to help with bathing/dressing/bathroom;Assistance with cooking/housework;Direct supervision/assist for medications management;Direct supervision/assist for financial management;Assist for transportation;Help with stairs or ramp for entrance    Equipment Recommendations Wheelchair cushion (measurements PT);Wheelchair (measurements PT);Hospital bed;Other (comment) (hoyer lift with pad)  Recommendations for Other Services       Functional Status Assessment Patient has had a recent decline in their functional status and demonstrates the ability to make significant improvements in function in a reasonable and predictable amount of time.     Precautions / Restrictions  Precautions Precautions: Fall Restrictions Weight Bearing Restrictions: No      Mobility  Bed Mobility Overal bed mobility: Needs Assistance Bed Mobility: Supine to Sit     Supine to sit: Total assist, +2 for physical assistance, HOB elevated, +2 for safety/equipment     General bed mobility comments: pt reposition attempted on stretcher, utilized sheet to transition to long sitting with total assist +2.    Transfers                        Ambulation/Gait                  Stairs            Wheelchair Mobility    Modified Rankin (Stroke Patients Only)       Balance                                             Pertinent Vitals/Pain Pain Assessment Pain Assessment: Faces Faces Pain Scale: No hurt    Home Living Family/patient expects to be discharged to:: Private residence Living Arrangements: Spouse/significant other                 Additional Comments: pt unable to report- will need further information    Prior Function Prior Level of Function : Needs assist;Patient poor historian/Family not available             Mobility Comments: per chart used RW for short distances but recently requiring increased assist to stand and unable to walk ADLs Comments: per chart needing assist for ADLs recently     Hand Dominance        Extremity/Trunk Assessment   Upper  Extremity Assessment Upper Extremity Assessment: Defer to OT evaluation RUE Deficits / Details: noted R hand flexion contracture, per chart review this is recent    Lower Extremity Assessment Lower Extremity Assessment: Generalized weakness (Very stiff bilaterally and limited knee flexion)       Communication   Communication: Other (comment) (difficulty to assess but assume due to lethargy)  Cognition Arousal/Alertness: Lethargic Behavior During Therapy: Flat affect Overall Cognitive Status: Difficult to assess                                  General Comments: pt able to follow simple commands with increased time, disoriented (unable to select correct month for his birthday). Lethargic and unable to maintain eyes open.        General Comments      Exercises     Assessment/Plan    PT Assessment Patient needs continued PT services  PT Problem List Decreased strength;Decreased activity tolerance;Decreased balance;Decreased mobility;Decreased cognition;Decreased knowledge of use of DME;Decreased safety awareness;Decreased knowledge of precautions       PT Treatment Interventions DME instruction;Gait training;Functional mobility training;Therapeutic exercise;Therapeutic activities;Balance training;Patient/family education    PT Goals (Current goals can be found in the Care Plan section)  Acute Rehab PT Goals Patient Stated Goal: to go home PT Goal Formulation: With patient Time For Goal Achievement: 07/12/21 Potential to Achieve Goals: Good    Frequency Min 2X/week     Co-evaluation PT/OT/SLP Co-Evaluation/Treatment: Yes Reason for Co-Treatment: Complexity of the patient's impairments (multi-system involvement);For patient/therapist safety;Necessary to address cognition/behavior during functional activity;To address functional/ADL transfers PT goals addressed during session: Mobility/safety with mobility;Balance OT goals addressed during session: ADL's and self-care       AM-PAC PT "6 Clicks" Mobility  Outcome Measure Help needed turning from your back to your side while in a flat bed without using bedrails?: Total Help needed moving from lying on your back to sitting on the side of a flat bed without using bedrails?: Total Help needed moving to and from a bed to a chair (including a wheelchair)?: Total Help needed standing up from a chair using your arms (e.g., wheelchair or bedside chair)?: Total Help needed to walk in hospital room?: Total Help needed climbing 3-5 steps with a railing? :  Total 6 Click Score: 6    End of Session   Activity Tolerance: Patient limited by lethargy Patient left: in bed;with call bell/phone within reach (on stretcher in ED) Nurse Communication: Mobility status PT Visit Diagnosis: Other abnormalities of gait and mobility (R26.89);Unsteadiness on feet (R26.81);Muscle weakness (generalized) (M62.81);Difficulty in walking, not elsewhere classified (R26.2)    Time: 7322-0254 PT Time Calculation (min) (ACUTE ONLY): 15 min   Charges:   PT Evaluation $PT Eval Moderate Complexity: 1 Mod          Reuel Derby, PT, DPT  Acute Rehabilitation Services  Pager: [phone] Office: [phone]   Rudean Hitt 06/28/2021, 2:05 PM

## 2021-06-28 NOTE — Assessment & Plan Note (Signed)
Noncompliant to BiPAP per family.

## 2021-06-28 NOTE — Assessment & Plan Note (Signed)
Due to dehydration-resolved.

## 2021-06-28 NOTE — Assessment & Plan Note (Signed)
Paced rhythm-but rate controlled.  On Eliquis.  Continue telemetry monitoring.

## 2021-06-28 NOTE — Assessment & Plan Note (Addendum)
Sepsis physiology has resolved-blood cultures negative so far-highly likely that this is aspiration pneumonia-continue Unasyn x5 days.  Repeat chest x-ray on 07/01/2020.

## 2021-06-28 NOTE — Assessment & Plan Note (Signed)
Continue levothyroxine 

## 2021-06-28 NOTE — Progress Notes (Signed)
Pt has a [employer] pacemaker and [employer] leads which takes his pacemaker from MRI conditional to unsafe.

## 2021-06-28 NOTE — Assessment & Plan Note (Addendum)
Volume status is stable-resumed Demadex.

## 2021-06-29 ENCOUNTER — Other Ambulatory Visit: Payer: Self-pay

## 2021-06-29 DIAGNOSIS — F419 Anxiety disorder, unspecified: Secondary | ICD-10-CM | POA: Diagnosis present

## 2021-06-29 DIAGNOSIS — G9341 Metabolic encephalopathy: Secondary | ICD-10-CM

## 2021-06-29 DIAGNOSIS — J189 Pneumonia, unspecified organism: Secondary | ICD-10-CM | POA: Diagnosis not present

## 2021-06-29 DIAGNOSIS — R6520 Severe sepsis without septic shock: Secondary | ICD-10-CM | POA: Diagnosis not present

## 2021-06-29 DIAGNOSIS — N179 Acute kidney failure, unspecified: Secondary | ICD-10-CM | POA: Diagnosis not present

## 2021-06-29 DIAGNOSIS — A419 Sepsis, unspecified organism: Secondary | ICD-10-CM | POA: Diagnosis not present

## 2021-06-29 LAB — URINE CULTURE: Culture: NO GROWTH

## 2021-06-29 LAB — COMPREHENSIVE METABOLIC PANEL
ALT: 14 U/L (ref 0–44)
AST: 63 U/L — ABNORMAL HIGH (ref 15–41)
Albumin: 3 g/dL — ABNORMAL LOW (ref 3.5–5.0)
Alkaline Phosphatase: 58 U/L (ref 38–126)
Anion gap: 9 (ref 5–15)
BUN: 45 mg/dL — ABNORMAL HIGH (ref 8–23)
CO2: 22 mmol/L (ref 22–32)
Calcium: 9.3 mg/dL (ref 8.9–10.3)
Chloride: 105 mmol/L (ref 98–111)
Creatinine, Ser: 1.69 mg/dL — ABNORMAL HIGH (ref 0.61–1.24)
GFR, Estimated: 41 mL/min — ABNORMAL LOW (ref >60)
Glucose, Bld: 86 mg/dL (ref 70–99)
Potassium: 4.1 mmol/L (ref 3.5–5.1)
Sodium: 136 mmol/L (ref 135–145)
Total Bilirubin: 1.5 mg/dL — ABNORMAL HIGH (ref 0.3–1.2)
Total Protein: 5.2 g/dL — ABNORMAL LOW (ref 6.5–8.1)

## 2021-06-29 LAB — GLUCOSE, CAPILLARY
Glucose-Capillary: 83 mg/dL (ref 70–99)
Glucose-Capillary: 97 mg/dL (ref 70–99)
Glucose-Capillary: 133 mg/dL — ABNORMAL HIGH (ref 70–99)
Glucose-Capillary: 99 mg/dL (ref 70–99)

## 2021-06-29 LAB — CBC
HCT: 40.2 % (ref 39.0–52.0)
Hemoglobin: 13.2 g/dL (ref 13.0–17.0)
MCH: 29.4 pg (ref 26.0–34.0)
MCHC: 32.8 g/dL (ref 30.0–36.0)
MCV: 89.5 fL (ref 80.0–100.0)
Platelets: 145 10*3/uL — ABNORMAL LOW (ref 150–400)
RBC: 4.49 MIL/uL (ref 4.22–5.81)
RDW: 13.7 % (ref 11.5–15.5)
WBC: 7.9 10*3/uL (ref 4.0–10.5)
nRBC: 0 % (ref 0.0–0.2)

## 2021-06-29 LAB — MRSA NEXT GEN BY PCR, NASAL: MRSA by PCR Next Gen: NOT DETECTED

## 2021-06-29 LAB — MAGNESIUM: Magnesium: 2 mg/dL (ref 1.7–2.4)

## 2021-06-29 LAB — CALCIUM, IONIZED: Calcium, Ionized, Serum: 5.4 mg/dL (ref 4.5–5.6)

## 2021-06-29 LAB — STREP PNEUMONIAE URINARY ANTIGEN: Strep Pneumo Urinary Antigen: NEGATIVE

## 2021-06-29 MED ORDER — ALBUTEROL SULFATE (2.5 MG/3ML) 0.083% IN NEBU: 2.5000 mg | INHALATION_SOLUTION | Freq: Four times a day (QID) | RESPIRATORY_TRACT | Status: DC | PRN

## 2021-06-29 MED ORDER — GLUCERNA SHAKE PO LIQD
237.0000 mL | Freq: Three times a day (TID) | ORAL | Status: DC
  Administered 2021-06-29 – 2021-07-03 (×7): 237 mL via ORAL
  Filled 2021-06-29 (×2): qty 237

## 2021-06-29 MED ORDER — ADULT MULTIVITAMIN W/MINERALS CH
1.0000 | ORAL_TABLET | Freq: Every day | ORAL | Status: DC
  Administered 2021-06-30 – 2021-07-04 (×5): 1 via ORAL
  Filled 2021-06-29 (×5): qty 1

## 2021-06-29 MED ORDER — LORAZEPAM 0.5 MG PO TABS
0.5000 mg | ORAL_TABLET | Freq: Two times a day (BID) | ORAL | Status: DC
  Administered 2021-06-29 – 2021-07-02 (×7): 0.5 mg via ORAL
  Filled 2021-06-29 (×7): qty 1

## 2021-06-29 MED ORDER — AMLODIPINE BESYLATE 5 MG PO TABS
5.0000 mg | ORAL_TABLET | Freq: Every day | ORAL | Status: DC
  Administered 2021-06-29 – 2021-07-04 (×6): 5 mg via ORAL
  Filled 2021-06-29 (×6): qty 1

## 2021-06-29 MED ORDER — GERHARDT'S BUTT CREAM
TOPICAL_CREAM | CUTANEOUS | Status: DC
  Filled 2021-06-29 (×3): qty 1

## 2021-06-29 MED ORDER — TORSEMIDE 20 MG PO TABS
20.0000 mg | ORAL_TABLET | Freq: Every day | ORAL | Status: DC
  Administered 2021-06-29 – 2021-06-30 (×2): 20 mg via ORAL
  Filled 2021-06-29 (×2): qty 1

## 2021-06-29 NOTE — Progress Notes (Signed)
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   GOAL:   Patient will meet greater than or equal to 90% of their needs   MONITOR:   PO intake, Supplement acceptance, Weight trends  REASON FOR ASSESSMENT:   Malnutrition Screening Tool    ASSESSMENT:    Pt is a 81 y.o. male with PMH significant for recently diagnosed Parkinson's disease, HTN, T2DM, HLD, GERD, stage III CKD, acquired hypothyroidism, who presented to the ED for altered mental status and was admitted to [facility] concerning for acute metabolic encephalopathy and severe sepsis due to left lower lobe pneumonia.   Pt currently on a DYS 1 diet, thin liquids, and 1500 ml fluid restriction.   Visited with pt at bedside. Pt alert, somewhat disoriented, but able to answer some questions. Per pt, poor appetite today. Pt reports that he did not have breakfast today, but he did have a few spoonfuls of applesauce. Per pt, pt typically has scrambled eggs for breakfast. Per pt, pt typically does not have lunch as pt reports he is not a "lunch guy." For dinner, pt reports that his wife cooks and they have chicken, potatoes, and beans.   Per pt, pt lives at home with his wife. Per pt, pt uses a walker at home at baseline.   Reviewed weight encounters. Pt's weight has been steadily decreasing over time, per documentation. Pt with a 23.6 kg weight loss from 03/05/21 - 06/14/21. This is a 14.8% weight loss, which is significant for time frame. No current weight documented. Per pt, pt's UBW is 354# and pt reports that he currently is 299#. Pt  reports that this weight loss is due to poor appetite.   Discussed with pt adequate po intake and oral nutrition supplementation to meet nutritional needs. Per pt, he has tried [employer] but states that it is too thick for his liking. Therefore, pt agreeable to trying Glucerna ONS as it has a less thick consistency. Discussed with pt MVI supplementation. Per pt, pt does not take a MVI at home, but is agreeable to taking a MVI at the hospital.   Medications reviewed. Protonix.   Labs reviewed: BUN: 45, Creatinine: 1.69  NUTRITION - FOCUSED PHYSICAL EXAM:  Flowsheet Row Most Recent Value  Orbital Region Mild depletion  Upper Arm Region Moderate depletion  Thoracic and Lumbar Region No depletion  Buccal Region Mild depletion  Temple Region Mild depletion  Clavicle Bone Region Mild depletion  Clavicle and Acromion Bone Region Mild depletion  Scapular Bone Region No depletion  Dorsal Hand Mild depletion  Patellar Region Mild depletion  Anterior Thigh Region Mild depletion  Posterior Calf Region Moderate depletion  Edema (RD Assessment) Mild  Hair Reviewed  Eyes Reviewed  Mouth Reviewed  Skin Reviewed  Nails Reviewed       Diet Order:   Diet Order             DIET - DYS 1 Room service appropriate? Yes; Fluid consistency: Thin; Fluid restriction: 1500 mL Fluid  Diet effective now                   EDUCATION NEEDS:  Education needs have been addressed  Skin:  Skin Assessment: Reviewed RN Assessment  Last BM:  unknown  Height:   Ht Readings from Last 1 Encounters:  06/29/21 6' (1.829 m)    Weight:   Wt Readings from Last 1 Encounters:  06/29/21 135.6 kg    Ideal Body Weight:  83.6 kg  BMI:  Body mass index is 40.55 kg/m.  BMI calculated from weight on 06/14/21  Estimated Nutritional Needs:   Kcal:  2200 - 2400  Protein:  110 - 125 grams  Fluid:  > 2 L   Maryruth Hancock, Dietetic Intern 06/29/2021 3:25 PM

## 2021-06-29 NOTE — TOC Initial Note (Addendum)
Transition of Care [facility]) - Initial/Assessment Note    Patient Details  Name: [name] MRN: [id number] Date of Birth: 1940/06/21  Transition of [facility]) CM/SW Contact:    Geralynn Ochs, LCSW Phone Number: 06/29/2021, 11:35 AM  Clinical Narrative:        CSW spoke with patient's daughter and spouse over the phone about recommendation for SNF. Family in agreement, will need to discuss with themselves and friends about preferences. CSW received permission to fax out referral, will follow up with bed offers and determine family choice.      UPDATE 3:35 PM: CSW met with wife at bedside to provide bed offers. Wife to share with daughter and come up with preference list. CSW to follow.       Expected Discharge Plan: Skilled Nursing Facility Barriers to Discharge: Continued Medical Work up, Ship broker   Patient Goals and CMS Choice Patient states their goals for this hospitalization and ongoing recovery are:: patient unable to participate in goal setting, not fully oriented CMS Medicare.gov Compare Post Acute Care list provided to:: Patient Represenative (must comment) Choice offered to / list presented to : Spouse, Adult Children  Expected Discharge Plan and Services Expected Discharge Plan: [place] Acute Care Choice: [place] Living arrangements for the past 2 months: Single Family Home                                      Prior Living Arrangements/Services Living arrangements for the past 2 months: Single Family Home Lives with:: Spouse Patient language and need for interpreter reviewed:: No Do you feel safe going back to the place where you live?: Yes      Need for Family Participation in Patient Care: Yes (Comment) Care giver support system in place?: No (comment) Current home services: Homehealth aide, Home OT, Home PT Criminal Activity/Legal Involvement Pertinent to Current Situation/Hospitalization: No -  Comment as needed  Activities of Daily Living Home Assistive Devices/Equipment: Walker (specify type) ADL Screening (condition at time of admission) Patient's cognitive ability adequate to safely complete daily activities?: No Is the patient deaf or have difficulty hearing?: No Does the patient have difficulty seeing, even when wearing glasses/contacts?: No Does the patient have difficulty concentrating, remembering, or making decisions?: Yes Patient able to express need for assistance with ADLs?: Yes Does the patient have difficulty dressing or bathing?: Yes Independently performs ADLs?: No Communication: Independent Dressing (OT): Dependent Is this a change from baseline?: Change from baseline, expected to last <3days Grooming: Dependent Is this a change from baseline?: Change from baseline, expected to last <3 days Feeding: Needs assistance Is this a change from baseline?: Change from baseline, expected to last <3 days Bathing: Dependent Is this a change from baseline?: Change from baseline, expected to last <3 days Toileting: Dependent Is this a change from baseline?: Change from baseline, expected to last <3 days In/Out Bed: Dependent Is this a change from baseline?: Change from baseline, expected to last <3 days Walks in Home: Needs assistance Is this a change from baseline?: Change from baseline, expected to last <3 days Does the patient have difficulty walking or climbing stairs?: Yes Weakness of Legs: Both Weakness of Arms/Hands: Both  Permission Sought/Granted Permission sought to share information with : Facility Sport and exercise psychologist, Family Supports Permission granted to share information with : Yes, Verbal Permission Granted  Share Information with  NAME: Tim Lair  Permission granted to share info w AGENCY: SNF  Permission granted to share info w Relationship: Spouse, Daughter     Emotional Assessment   Attitude/Demeanor/Rapport: Unable to Assess Affect  (typically observed): Unable to Assess Orientation: : Oriented to Self Alcohol / Substance Use: Not Applicable Psych Involvement: No (comment)  Admission diagnosis:  Lactic acidosis [E87.20] Delirium [R41.0] AKI (acute kidney injury) ([place]) [N17.9] Altered mental status, unspecified altered mental status type [E32.00] Acute metabolic encephalopathy [V79.44] Patient Active Problem List   Diagnosis Date Noted   Anxiety disorder 46/19/0122   Acute metabolic encephalopathy 24/04/4642   Generalized weakness 06/28/2021   Severe sepsis due to left lower lobe pneumonia 06/28/2021   Left lower lobe pneumonia 06/28/2021   Lactic acidosis 06/28/2021   AKI on CKD stage IIIb. 06/28/2021   Hypercalcemia 06/28/2021   Chronic kidney disease, stage 3b ([place]) 06/28/2021   HTN (hypertension) 06/28/2021   Chronic HFrEF (heart failure with reduced ejection fraction) (EF 45-50% by echo on 03/23/2021) 06/28/2021   Hypomagnesemia 06/28/2021   OSA (obstructive sleep apnea) 06/28/2021   Achalasia-s/p EGD on 06/05/2021-injected with borderlining toxin. 06/28/2021   ?  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

## 2021-06-29 NOTE — Plan of Care (Signed)
Pt is alert x 2, no distress noted. Purewick in place, pt has mepilex to sacrum. Pt has redness and a stage II pressure area to buttocks, open area to sacrum. Pt has abrasions to bilateral arms and legs. Pt has bruising to bilateral arms and right knee. Pts wife [name] called to ask admission questions regarding pt. Pt had two falls at home. Pt has home health physical therapy, homehealth NA and nurse.

## 2021-06-29 NOTE — Consult Note (Signed)
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 Patient with urinary incontinence and immobility.  Currently using an external male urinary incontinence device (PurWick for Males) and has been placed on DermaTherapy bed linens with a low friction coefficient (CoF). Ten (10cm) area of interrupted partial thickness skin loss on right upper arm at most distal location of blood pressure cuff.  Wound type:irritant contact dermatitis plus chronic friction skin injury  ICD-10 CM Codes for Irritant Dermatitis  L24A2 - Due to fecal, urinary or dual incontinence L24A9 - Due to friction or contact with other specified body fluids L30.4  - Erythema intertrigo. Also used for abrasion of the hand, chafing of the skin, dermatitis due to sweating and friction, friction dermatitis, friction eczema, and genital/thigh intertrigo.   Pressure Injury POA: N/A Measurement: Two linear more deeply hued areas near the gluteal clef, 10cm long x 2cm wide with short, jagged skin appendages consistent with the presentation of chronic friction skin injury. Wound bed: As noted above PLUS, erythematous scrotal and perineal area, also medial thighs. No wounds.   Drainage (amount, consistency, odor) None Periwound: intact, erythematous Dressing procedure/placement/frequency: I will provide patient with a mattress replacement with low air loss feature and guidance for Nursing to turn and reposition patient from side to side and minimize time in the supine position to mitigate microclimate/moisture and to decrease friction. I have provided guidance for the topical care to the affected areas on the buttocks and perineal areas using Gerhart's Butt Cream, a compounded 1:1:1 product consisting of hydrocortisone cream, zinc oxide and lotrimin cream.This is to be applied every 4 hours in a thin layer and pay be reapplied PRN  post incontinence episodes or inadvertent removal. The right upper extremity will be dressed daily with an antimicrobial nonadherent (Xeroform) and topped and an ABD and secured with Kerlix.  [place] nursing team will not follow, but will remain available to this patient, the nursing and medical teams.  Please re-consult if needed. Thanks, Maudie Flakes, MSN, RN, [place], Arther Abbott  Pager# [phone]

## 2021-06-29 NOTE — Progress Notes (Signed)
PROGRESS NOTE        PATIENT DETAILS Name: [name] Age: 81 y.o. 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  He was subsequently admitted to the hospitalist service.  See below for further details.  Significant events: 1/25>> admit to [facility] for encephalopathy/PNA/sepsis/AKI.  Significant imaging studies: 1/25>> CXR: LLL PNA 1/26>> CT head: No acute pathology  Culture data: 1/25>> blood culture: No growth 1/25>> COVID/flu PCR: Negative  Subjective: Awake/alert-looks frail and weak.  No chest pain.  Objective: Vitals: Blood pressure (!) 143/96, pulse 81, temperature 97.9 F (36.6 C), temperature source Oral, resp. rate 20, SpO2 100 %.   Exam: Gen Exam:Alert awake-not in any distress-looks frail/chronically sick appearing. HEENT:atraumatic, normocephalic Chest: B/L clear to auscultation anteriorly CVS:S1S2 regular Abdomen:soft non tender, non distended Extremities:no edema Neurology: + Tremors, right arm still flexed at elbow but less tone today. Skin: no rash   Pertinent Labs/Radiology: CBC Latest Ref Rng & Units 06/29/2021 06/28/2021 06/28/2021  WBC 4.0 - 10.5 K/uL 7.9 - 8.0  Hemoglobin 13.0 - 17.0 g/dL 13.2 11.6(L) 12.2(L)  Hematocrit 39.0 - 52.0 % 40.2 34.0(L) 38.9(L)  Platelets 150 - 400 K/uL  145(L) - 139(L)    Lab Results  Component Value Date   NA 136 06/29/2021   K 4.1 06/29/2021   CL 105 06/29/2021   CO2 22 06/29/2021      Assessment/Plan: * Acute metabolic encephalopathy- (present on admission) Multifactorial etiology-due to PNA/AKI-in from multiple medication changes.  Clinically improved-more awake and alert compared to how he first presented to the ED.  Remains on empiric IV Unasyn-restarted on benzodiazepines on 1/26 after discussion with neurology.  No longer on Sinemet.    ?  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  Given his body habitus-lumbar puncture will be quite challenging.  No longer on Sinemet.  Will await further recommendations from neurology team.   Anxiety disorder- (present on admission) Longstanding history of anxiety disorder-recently tapered off benzodiazepines-and started on Gabitril.  After discussion with neurology-has been started on scheduled bronchodilators with some clinical improvement in his dystonia/mental status.  Severe sepsis due to left lower lobe pneumonia- (present on admission) Sepsis physiology has resolved-blood cultures negative so far-highly likely that this is aspiration pneumonia-continue Unasyn x5 days.  Hypomagnesemia Repleted.  AKI on CKD stage IIIb.- (present on admission) AKI likely hemodynamically mediated-improved with supportive care-and close to baseline.  Hypercalcemia- (present on admission) Due to dehydration-resolved.  Chronic HFrEF (heart failure with reduced ejection fraction) (EF 45-50% by echo on 03/23/2021)- (present on admission) Volume status is stable-resume Demadex over the next few days.  Paroxysmal atrial  fibrillation ([place])- (present  on admission) Paced rhythm-but rate controlled.  On Eliquis.  Continue telemetry monitoring.  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  HTN (hypertension)- (present on admission) BP slowly creeping up-we will start low-dose amlodipine-continue to hold losartan due to resolving AKI.  Generalized weakness Significant functional decline over the past 1 year-at baseline using cane/walker-however due to acute illness-nonambulatory for almost a week.  On exam-has generalized weakness but is nonfocal.  Continues to have tremors.  Suspicion for neurodegenerative issue per neurology-work-up in progress-see above.  Evaluated by PT/OT-recommendations are for SNF.  Hypothyroidism-TSH 1.345 on 1/23- (present on admission) Continue levothyroxine.  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 Appreciate SLP input-starting dysphagia 1 diet thin liquids.  Watch for signs of aspiration.  OSA (obstructive sleep apnea)- (present on admission) Noncompliant to BiPAP per family.   BMI: Estimated body mass index is 40.55 kg/m as calculated from the following:   Height as of 06/14/21: 6' (1.829 m).   Weight as of 06/14/21: 135.6 kg.    DVT Prophylaxis: Eliquis Procedures: None Consults: Neurology Code Status:DNR Family Communication: Daughter-Tammy over the phone-[phone] on 1/27   Disposition Plan: Status is: Inpatient  Remains inpatient appropriate because: Resolving sepsis-encephalopathy-not yet stable for discharge.     Diet: Diet Order             DIET - DYS 1 Room service appropriate? Yes; Fluid consistency: Thin; Fluid restriction: 1500 mL Fluid  Diet effective now                     Antimicrobial agents: Anti-infectives (From admission, onward)    Start     Dose/Rate Route Frequency Ordered Stop   06/28/21 2200  vancomycin (VANCOREADY) IVPB 750 mg/150 mL  Status:   Discontinued        750 mg 150 mL/hr over 60 Minutes Intravenous Every 24 hours 06/28/21 0012 06/28/21 0926   06/28/21 2200  vancomycin (VANCOCIN) IVPB 1000 mg/200 mL premix  Status:  Discontinued        1,000 mg 200 mL/hr over 60 Minutes Intravenous Every 24 hours 06/28/21 0926 06/28/21 1355   06/28/21 1430  Ampicillin-Sulbactam (UNASYN) 3 g in sodium chloride 0.9 % 100 mL IVPB        3 g 200 mL/hr over 30 Minutes Intravenous Every 6 hours 06/28/21 1417     06/28/21 1000  ceFEPIme (MAXIPIME) 2 g in sodium chloride 0.9 % 100 mL IVPB  Status:  Discontinued        2 g 200 mL/hr over 30 Minutes Intravenous Every 12 hours 06/28/21 0007 06/28/21 1355   06/27/21 2330  ceFEPIme (MAXIPIME) 2 g in sodium chloride 0.9 % 100 mL IVPB        2 g 200 mL/hr over 30 Minutes Intravenous  Once 06/27/21 2321 06/28/21 0236   06/27/21 2330  metroNIDAZOLE (FLAGYL) IVPB 500 mg        500 mg 100 mL/hr over 60 Minutes Intravenous  Once 06/27/21 2321 06/28/21 0236   06/27/21 2330  vancomycin (VANCOCIN) IVPB 1000 mg/200 mL premix  Status:  Discontinued        1,000 mg 200 mL/hr over 60 Minutes Intravenous  Once 06/27/21 2321 06/27/21 2323   06/27/21 2330  vancomycin (VANCOREADY) IVPB 2000 mg/400 mL        2,000 mg 200 mL/hr over 120 Minutes Intravenous  Once 06/27/21 2323 06/28/21 0347        MEDICATIONS: Scheduled Meds:  amLODipine  5 mg Oral Daily   apixaban  5 mg Oral BID   feeding supplement (GLUCERNA SHAKE)  237 mL Oral TID BM   Gerhardt's butt cream   Topical Q4H   levothyroxine  137 mcg Oral QAC breakfast   LORazepam  0.5 mg Intravenous Q12H   pantoprazole  40 mg Oral Q1200   rosuvastatin  10 mg Oral Daily   tiaGABine  8 mg Oral BID   Continuous Infusions:  ampicillin-sulbactam (UNASYN) IV 3 g (06/29/21 0355)   PRN Meds:.acetaminophen **OR** acetaminophen   I have personally reviewed following labs and imaging studies  LABORATORY DATA: CBC: Recent Labs  Lab 06/27/21 2157  06/28/21 0458 06/28/21 0509 06/29/21 0240  WBC 11.9* 8.0  --  7.9  NEUTROABS 9.9* 6.8  --   --   HGB 15.6 12.2* 11.6* 13.2  HCT 48.7 38.9* 34.0* 40.2  MCV 90.4 92.2  --  89.5  PLT 231 139*  --  145*    Basic Metabolic Panel: Recent Labs  Lab 06/27/21 2157 06/28/21 0458 06/28/21 0509 06/29/21 0240  NA 137 136 135 136  K 5.3* 4.2 4.0 4.1  CL 100 108  --  105  CO2 22 18*  --  22  GLUCOSE 155* 100*  --  86  BUN 55* 51*  --  45*  CREATININE 2.51* 2.01*  --  1.69*  CALCIUM 10.4* 8.9  --  9.3  MG  --  1.6*  --  2.0    GFR: CrCl cannot be calculated (Unknown ideal weight.).  Liver Function Tests: Recent Labs  Lab 06/27/21 2157 06/28/21 0458 06/29/21 0240  AST 35 32 63*  ALT 19 15 14   ALKPHOS 75 56 58  BILITOT 2.7* 1.9* 1.5*  PROT 6.9 4.8* 5.2*  ALBUMIN 3.8 2.8* 3.0*   No results for input(s): LIPASE, AMYLASE in the last 168 hours. Recent Labs  Lab 06/28/21 0458  AMMONIA 36*    Coagulation Profile: Recent Labs  Lab 06/27/21 2157  INR 1.5*    Cardiac Enzymes: Recent Labs  Lab 06/27/21 2315 06/28/21 0458  CKTOTAL 435* 494*    BNP (last 3 results) No results for input(s): PROBNP in the last 8760 hours.  Lipid Profile: No results for input(s): CHOL, HDL, LDLCALC, TRIG, CHOLHDL, LDLDIRECT in the last 72 hours.  Thyroid Function Tests: Recent Labs    06/27/21 2315  TSH 1.345    Anemia Panel: No results for input(s): VITAMINB12, FOLATE, FERRITIN, TIBC, IRON, RETICCTPCT in the last 72 hours.  Urine analysis:    Component Value Date/Time   COLORURINE AMBER (A) 06/28/2021 0422   APPEARANCEUR HAZY (A) 06/28/2021 0422   LABSPEC 1.023 06/28/2021 0422   PHURINE 5.0 06/28/2021 0422   GLUCOSEU NEGATIVE 06/28/2021 0422   HGBUR SMALL (A) 06/28/2021 0422   BILIRUBINUR NEGATIVE 06/28/2021 0422   KETONESUR 5 (A) 06/28/2021 0422   PROTEINUR 30 (A) 06/28/2021 0422   NITRITE NEGATIVE 06/28/2021 0422   LEUKOCYTESUR NEGATIVE 06/28/2021 0422    Sepsis  Labs: Lactic Acid, Venous    Component Value Date/Time   LATICACIDVEN 2.5 ([place]) 06/27/2021 2315    MICROBIOLOGY: Recent Results (from the past 240 hour(s))  Urine Culture     Status: None   Collection Time: 06/27/21  9:42 PM   Specimen: In/Out Cath Urine  Result Value Ref Range Status   Specimen Description IN/OUT CATH URINE  Final   Special Requests NONE  Final   Culture   Final  NO GROWTH Performed at [facility], [place] [address] [place], [place] [address]    Report Status 06/29/2021 FINAL  Final  Blood Culture (routine x 2)     Status: None (Preliminary result)   Collection Time: 06/27/21  9:57 PM   Specimen: BLOOD LEFT ARM  Result Value Ref Range Status   Specimen Description BLOOD LEFT ARM  Final   Special Requests   Final    BOTTLES DRAWN AEROBIC AND ANAEROBIC Blood Culture adequate volume   Culture   Final    NO GROWTH 2 DAYS Performed at [facility], [place] [address], [place], [place] [address]    Report Status PENDING  Incomplete  Blood Culture (routine x 2)     Status: None (Preliminary result)   Collection Time: 06/27/21  9:57 PM   Specimen: BLOOD LEFT HAND  Result Value Ref Range Status   Specimen Description BLOOD LEFT HAND  Final   Special Requests AEROBIC BOTTLE ONLY Blood Culture adequate volume  Final   Culture   Final    NO GROWTH 2 DAYS Performed at [facility], [place] [address], [place], [place] [address]    Report Status PENDING  Incomplete  Resp Panel by RT-PCR (Flu A&B, Covid) Nasopharyngeal Swab     Status: None   Collection Time: 06/27/21 10:02 PM   Specimen: Nasopharyngeal Swab; Nasopharyngeal(NP) swabs in vial transport medium  Result Value Ref Range Status   SARS Coronavirus 2 by RT PCR NEGATIVE NEGATIVE Final    Comment: (NOTE) SARS-CoV-2 target nucleic acids are NOT DETECTED.  The SARS-CoV-2 RNA is generally detectable in upper respiratory specimens during the acute phase of infection. The lowest concentration of  SARS-CoV-2 viral copies this assay can detect is 138 copies/mL. A negative result does not preclude SARS-Cov-2 infection and should not be used as the sole basis for treatment or other patient management decisions. A negative result may occur with  improper specimen collection/handling, submission of specimen other than nasopharyngeal swab, presence of viral mutation(s) within the areas targeted by this assay, and inadequate number of viral copies(<138 copies/mL). A negative result must be combined with clinical observations, patient history, and epidemiological information. The expected result is Negative.  Fact Sheet for Patients:  EntrepreneurPulse.com.au  Fact Sheet for Healthcare Providers:  IncredibleEmployment.be  This test is no t yet approved or cleared by the [place] FDA and  has been authorized for detection and/or diagnosis of SARS-CoV-2 by FDA under an Emergency Use Authorization (EUA). This EUA will remain  in effect (meaning this test can be used) for the duration of the COVID-19 declaration under Section 564(b)(1) of the Act, 21 U.S.C.section 360bbb-3(b)(1), unless the authorization is terminated  or revoked sooner.       Influenza A by PCR NEGATIVE NEGATIVE Final   Influenza B by PCR NEGATIVE NEGATIVE Final    Comment: (NOTE) The Xpert Xpress SARS-CoV-2/FLU/RSV plus assay is intended as an aid in the diagnosis of influenza from Nasopharyngeal swab specimens and should not be used as a sole basis for treatment. Nasal washings and aspirates are unacceptable for Xpert Xpress SARS-CoV-2/FLU/RSV testing.  Fact Sheet for Patients: EntrepreneurPulse.com.au  Fact Sheet for Healthcare Providers: IncredibleEmployment.be  This test is not yet approved or cleared by the [place] FDA and has been authorized for detection and/or diagnosis of SARS-CoV-2 by FDA under an Emergency Use  Authorization (EUA). This EUA will remain in effect (meaning this test can be used) for the duration  of the COVID-19 declaration under Section 564(b)(1) of the Act, 21 U.S.C. section 360bbb-3(b)(1), unless the authorization is terminated or revoked.  Performed at [facility], [place] [address], [place], [place] [address]     RADIOLOGY STUDIES/RESULTS: CT Head Wo Contrast  Result Date: 06/28/2021 CLINICAL DATA:  Altered mental status. EXAM: CT HEAD WITHOUT CONTRAST TECHNIQUE: Contiguous axial images were obtained from the base of the skull through the vertex without intravenous contrast. RADIATION DOSE REDUCTION: This exam was performed according to the departmental dose-optimization program which includes automated exposure control, adjustment of the mA and/or kV according to patient size and/or use of iterative reconstruction technique. COMPARISON:  None. FINDINGS: Brain: Moderate age-related atrophy and chronic microvascular ischemic changes. There is no acute intracranial hemorrhage. No mass effect or midline shift. No extra-axial fluid collection. Vascular: No hyperdense vessel or unexpected calcification. Skull: Normal. Negative for fracture or focal lesion. Sinuses/Orbits: No acute finding. Other: None IMPRESSION: 1. No acute intracranial pathology. 2. Moderate age-related atrophy and chronic microvascular ischemic changes. Electronically Signed   By: Anner Crete M.D.   On: 06/28/2021 03:02   DG Chest Port 1 View  Result Date: 06/27/2021 CLINICAL DATA:  Sepsis workup. EXAM: PORTABLE CHEST 1 VIEW COMPARISON:  PA and lateral chest 04/15/2021 FINDINGS: The heart enlarged but stable. Central vessels are normal caliber. Left chest dual lead pacing system and wire insertions are unchanged. The aorta is tortuous with ectasia and patchy calcification. Stable mediastinum. There is increased opacity in the left lower lung field concerning for pneumonia or aspiration. Small left pleural  effusion may also be forming. The remaining lungs are clear. Mild thoracic spondylosis. Osteopenia. IMPRESSION: Increased opacity in the left lower lobe concerning for aspiration or pneumonia. Question small left pleural effusion. Aortic atherosclerosis. Cardiomegaly. Electronically Signed   By: Telford Nab M.D.   On: 06/27/2021 22:50     LOS: 1 day   Oren Binet, MD  [facility]    To contact the attending provider between 7A-7P or the covering provider during after hours 7P-7A, please log into the web site [email or web address] and access using universal [place] password for that web site. If you do not have the password, please call the hospital operator.  06/29/2021, 11:34 AM

## 2021-06-29 NOTE — Progress Notes (Signed)
Speech Language Pathology Treatment: Dysphagia  Patient Details Name: [name] MRN: [id number] DOB: 01-07-1941 Today's Date: 06/29/2021 Time: 4098-1191 SLP Time Calculation (min) (ACUTE ONLY): 15 min  Assessment / Plan / Recommendation Clinical Impression  Treatment focused on dysphagia with wife at bedside. Pt is now on po's and affirms reluctance/fear at times. WIfe noted pt did eat more today than he normally would at home. 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 No indications aspiration with regular/thin today. Reviewed strategies to facilitate intake (smaller more frequent meals, moist meats, remain upright after meals etc). All questions were answered. No further ST needed.     HPI HPI: [name] is a 81 y.o. male admitted to [facility] on 06/27/2021 with suspected acute metabolic encephalopathy after presenting from home to [facility] ED for evaluation of altered mental status. Pt presented with acute metabolic encephalopathy in the setting of severe sepsis due to suspected left lower lobe pneumonia and complicated by acute kidney injury and generalized weakness, with evidence of an element of dehydration He was recently diagnosed with Parkinson's disease, and started on levodopa carbidopa.  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 Dysmotility also reported. Pt has been fearful of eating since this event.      SLP Plan  All goals met;Discharge SLP treatment due to (comment)      Recommendations for follow up therapy are one component of a multi-disciplinary discharge planning process, led by the attending physician.  Recommendations may be updated based on patient status,  additional functional criteria and insurance authorization.    Recommendations  Diet recommendations: Regular;Thin liquid Liquids provided via: Cup;Straw Medication Administration: Whole meds with liquid Supervision: Patient able to self feed Compensations: Small sips/bites Postural Changes and/or Swallow Maneuvers: Seated upright 90 degrees;Upright 30-60 min after meal                Oral Care Recommendations: Oral care BID Follow Up Recommendations: No SLP follow up Assistance recommended at discharge: None SLP Visit Diagnosis: Dysphagia, unspecified (R13.10) Plan: All goals met;Discharge SLP treatment due to (comment)           [name]  06/29/2021, 2:57 PM

## 2021-06-29 NOTE — Assessment & Plan Note (Signed)
Longstanding history of anxiety disorder-recently tapered off benzodiazepines-and started on Gabitril.  After discussion with neurology-has been started on scheduled bronchodilators with some clinical improvement in his dystonia/mental status.

## 2021-06-29 NOTE — Discharge Instructions (Addendum)
Follow with Primary MD [name], MD in 7 days   Get CBC, CMP, 2 view Chest X ray -  checked next visit within 1 week by SNF MD   Activity: As tolerated with Full fall precautions use walker/cane & assistance as needed  Disposition SNF  Diet: Dysphagia 1 diet with feeding assistance and aspiration precautions.  Must eat sitting up in chair.   Special Instructions: If you have smoked or chewed Tobacco  in the last 2 yrs please stop smoking, stop any regular Alcohol  and or any Recreational drug use.  On your next visit with your primary care physician please Get Medicines reviewed and adjusted.  Please request your Prim.MD to go over all Hospital Tests and Procedure/Radiological results at the follow up, please get all Hospital records sent to your Prim MD by signing hospital release before you go home.  If you experience worsening of your admission symptoms, develop shortness of breath, life threatening emergency, suicidal or homicidal thoughts you must seek medical attention immediately by calling 911 or calling your MD immediately  if symptoms less severe.  You Must read complete instructions/literature along with all the possible adverse reactions/side effects for all the Medicines you take and that have been prescribed to you. Take any new Medicines after you have completely understood and accpet all the possible adverse reactions/side effects.    Information on my medicine - ELIQUIS (apixaban)  This medication education was reviewed with me or my healthcare representative as part of my discharge preparation.  The pharmacist that spoke with me during my hospital stay was:    Why was Eliquis prescribed for you? Eliquis was prescribed for you to reduce the risk of a blood clot forming that can cause a stroke if you have a medical condition called atrial fibrillation (a type of irregular heartbeat).  What do You need to know about Eliquis ? Take your Eliquis TWICE DAILY - one  tablet in the morning and one tablet in the evening with or without food. If you have difficulty swallowing the tablet whole please discuss with your pharmacist how to take the medication safely.  Take Eliquis exactly as prescribed by your doctor and DO NOT stop taking Eliquis without talking to the doctor who prescribed the medication.  Stopping may increase your risk of developing a stroke.  Refill your prescription before you run out.  After discharge, you should have regular check-up appointments with your healthcare provider that is prescribing your Eliquis.  In the future your dose may need to be changed if your kidney function or weight changes by a significant amount or as you get older.  What do you do if you miss a dose? If you miss a dose, take it as soon as you remember on the same day and resume taking twice daily.  Do not take more than one dose of ELIQUIS at the same time to make up a missed dose.  Important Safety Information A possible side effect of Eliquis is bleeding. You should call your healthcare provider right away if you experience any of the following: Bleeding from an injury or your nose that does not stop. Unusual colored urine (red or dark brown) or unusual colored stools (red or black). Unusual bruising for unknown reasons. A serious fall or if you hit your head (even if there is no bleeding).  Some medicines may interact with Eliquis and might increase your risk of bleeding or clotting while on Eliquis. To help avoid this, consult  your healthcare provider or pharmacist prior to using any new prescription or non-prescription medications, including herbals, vitamins, non-steroidal anti-inflammatory drugs (NSAIDs) and supplements.  This website has more information on Eliquis (apixaban): http://www.eliquis.com/eliquis/home

## 2021-06-29 NOTE — Progress Notes (Signed)
Neurology Progress Note   S:// Seen and examined.  Voices no new complaints.   O:// Current vital signs: BP (!) 143/96 (BP Location: Right Arm)    Pulse 81    Temp 97.9 F (36.6 C) (Oral)    Resp 20    SpO2 100%  Vital signs in last 24 hours: Temp:  [97.3 F (36.3 C)-98 F (36.7 C)] 97.9 F (36.6 C) (01/27 0827) Pulse Rate:  [64-84] 81 (01/27 0827) Resp:  [15-20] 20 (01/27 0827) BP: (109-165)/(59-109) 143/96 (01/27 0827) SpO2:  [97 %-100 %] 100 % (01/27 0827) General: Obese man, awake alert in no distress HEENT: Normocephalic/atraumatic CVS: Regular rate rhythm Abdomen obese, nontender Extremities warm well perfused Integumentary: Multiple patchy red areas with scaling Neurological exam Awake alert oriented x3 Mild dysarthria No evidence of aphasia Cranial nerves: 2-12 grossly unremarkable.  No tongue fasciculations Motor exam: Right arm-does have mildly increased tone but is able to raise the arm up, his fingers are flexed and difficult to open his closed fist but he does not have a flexed wrist and have been noted by Dr. Leonel Ramsay yesterday.  Left upper extremity..  Both legs are barely antigravity.  I did not witness any fasciculations either at rest or on mild stimulation of the muscles in the lower extremities. Sensation: Intact DTRs are 1+ all over Plantars: There are atrophic changes on his toes but it does look like he has an upgoing toe without real finding of the other toes which could be a striatal toe.  I also question if there is arthritis and deformity making the toe appearance abnormal.   Medications  Current Facility-Administered Medications:    acetaminophen (TYLENOL) tablet 650 mg, 650 mg, Oral, Q6H PRN, 650 mg at 06/29/21 0702 **OR** acetaminophen (TYLENOL) suppository 650 mg, 650 mg, Rectal, Q6H PRN, Howerter, Justin B, DO   Ampicillin-Sulbactam (UNASYN) 3 g in sodium chloride 0.9 % 100 mL IVPB, 3 g, Intravenous, Q6H, [place], [place], [place], Last Rate:  200 mL/hr at 06/29/21 0355, 3 g at 06/29/21 0355   apixaban (ELIQUIS) tablet 5 mg, 5 mg, Oral, BID, Howerter, Justin B, DO, 5 mg at 06/29/21 1009   feeding supplement (GLUCERNA SHAKE) (GLUCERNA SHAKE) liquid 237 mL, 237 mL, Oral, TID BM, Ghimire, Henreitta Leber, MD   Gerhardt's butt cream, , Topical, Q4H, Ghimire, Henreitta Leber, MD   levothyroxine (SYNTHROID) tablet 137 mcg, 137 mcg, Oral, QAC breakfast, Howerter, Justin B, DO, 137 mcg at 06/29/21 0703   LORazepam (ATIVAN) injection 0.5 mg, 0.5 mg, Intravenous, Q12H, Ghimire, Shanker M, MD, 0.5 mg at 06/29/21 1010   pantoprazole (PROTONIX) EC tablet 40 mg, 40 mg, Oral, Q1200, Ghimire, Shanker M, MD, 40 mg at 06/28/21 2257   rosuvastatin (CRESTOR) tablet 10 mg, 10 mg, Oral, Daily, Howerter, Justin B, DO, 10 mg at 06/29/21 1009   tiaGABine (GABITRIL) tablet 8 mg, 8 mg, Oral, BID, Greta Doom, MD, 8 mg at 06/29/21 0703 Labs CBC    Component Value Date/Time   WBC 7.9 06/29/2021 0240   RBC 4.49 06/29/2021 0240   HGB 13.2 06/29/2021 0240   HGB 13.4 11/19/2017 1443   HCT 40.2 06/29/2021 0240   HCT 40.6 11/19/2017 1443   PLT 145 (L) 06/29/2021 0240   PLT 197 11/19/2017 1443   MCV 89.5 06/29/2021 0240   MCV 86 11/19/2017 1443   MCH 29.4 06/29/2021 0240   MCHC 32.8 06/29/2021 0240   RDW 13.7 06/29/2021 0240   RDW 15.2 11/19/2017 1443  LYMPHSABS 0.4 (L) 06/28/2021 0458   LYMPHSABS 0.9 11/19/2017 1443   MONOABS 0.6 06/28/2021 0458   EOSABS 0.2 06/28/2021 0458   EOSABS 0.2 11/19/2017 1443   BASOSABS 0.0 06/28/2021 0458   BASOSABS 0.0 11/19/2017 1443    CMP     Component Value Date/Time   NA 136 06/29/2021 0240   NA 134 05/25/2021 1412   K 4.1 06/29/2021 0240   CL 105 06/29/2021 0240   CO2 22 06/29/2021 0240   GLUCOSE 86 06/29/2021 0240   BUN 45 (H) 06/29/2021 0240   BUN 17 05/25/2021 1412   CREATININE 1.69 (H) 06/29/2021 0240   CALCIUM 9.3 06/29/2021 0240   PROT 5.2 (L) 06/29/2021 0240   PROT 6.6 05/25/2021 1412   ALBUMIN 3.0  (L) 06/29/2021 0240   ALBUMIN 4.3 05/25/2021 1412   AST 63 (H) 06/29/2021 0240   ALT 14 06/29/2021 0240   ALKPHOS 58 06/29/2021 0240   BILITOT 1.5 (H) 06/29/2021 0240   BILITOT 1.3 (H) 05/25/2021 1412   GFRNONAA 41 (L) 06/29/2021 0240   GFRAA 43 (L) 03/18/2019 1530   Imaging I have reviewed images in epic and the results pertinent to this consultation are: CT-scan of the brain-unremarkable MRI examination of the brain-was ordered but his pacemaker and leads are MRI incompatible as reported by the radiology technologist who checked that out.  Assessment and recommendations: 81 year old man with progressively increasing stiffness, gait disorder, worsening panic and tremors presented for evaluation of increasing stiffness and rigidity as well as dysphagia. 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  He received Haldol in the emergency room and his symptoms improved making anxiety as possible underlying exacerbate her symptoms. He had multiple medication changes made over the last couple of weeks-stopping Ativan, increasing Gabitril and starting Sinemet. Sinemet was stopped Monday but family did not notice any improvement. Gabitril can cause myoclonus and was reduced on arrival in the ER. Ativan was also started at 0.5 mg twice daily.  Obtaining an MRI would have been very helpful to assess for changes of frontotemporal dementia or cortical basal degeneration but that is unfortunately not possible.  Repeating head CT today is also not would add value as he had 1 done yesterday. An LP to evaluate the CSF for autoimmune and infectious etiologies could be considered but given his exceptionally large body habitus and current deconditioning, I am unable to position him to perform an LP at bedside.  I do not think he can lay flat on his  belly to get 1 done under IR as well.  At this time, I am not entirely sure of the underlying diagnosis, which also includes stiff person syndrome and antibodies have been sent-pending.  He does appear to have some sort of neurodegenerative process without clear definitive diagnosis at this time and would require continued outpatient care.  In the interim, as an inpatient, I would continue him on the Ativan 0.5 twice daily.  I would continue to hold the Sinemet.  And I would also keep him on tiagabine 8 mg dose and not on 12 mg dose.  Possibly his anxiety was contributing to a lot of the symptoms but I do not think that is the complete explanation of his current symptoms.  Management of his anxiety symptoms per primary team/psychiatry.  Discussed with his daughter over the phone and Dr. Sloan Leiter   --  Amie Portland, MD Neurologist [facility] Pager: [phone]

## 2021-06-29 NOTE — NC FL2 (Signed)
[place] MEDICAID FL2 LEVEL OF CARE SCREENING TOOL     IDENTIFICATION  Patient Name: [name] Birthdate: 05-Jan-1941 Sex: male Admission Date (Current Location): 06/27/2021  [facility] [place] and [place] Number:  Herbalist and Address:  The [place], [place] [address], [place], [place] [address]      Provider Number: [id number]  Attending Physician Name and Address:  Jonetta Osgood, MD  Relative Name and Phone Number:       Current Level of Care: Hospital Recommended Level of Care: [place] Prior Approval Number:    Date Approved/Denied:   PASRR Number: [id number]  Discharge Plan: SNF    Current Diagnoses: Patient Active Problem List   Diagnosis Date Noted   Acute metabolic encephalopathy 19/41/7408   Generalized weakness 06/28/2021   Severe sepsis due to left lower lobe pneumonia 06/28/2021   Left lower lobe pneumonia 06/28/2021   Lactic acidosis 06/28/2021   AKI on CKD stage IIIb. 06/28/2021   Hypercalcemia 06/28/2021   Chronic kidney disease, stage 3b ([place]) 06/28/2021   HTN (hypertension) 06/28/2021   Chronic HFrEF (heart failure with reduced ejection fraction) (EF 45-50% by echo on 03/23/2021) 06/28/2021   Hypomagnesemia 06/28/2021   OSA (obstructive sleep apnea) 06/28/2021   Achalasia-s/p EGD on 06/05/2021-injected with borderlining toxin. 06/28/2021   ?  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   Paroxysmal atrial fibrillation ([place]) 06/24/2019   Secondary hypercoagulable state ([place]) 06/24/2019   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    Orientation RESPIRATION BLADDER Height & Weight     Self  Normal Incontinent Weight:   Height:     BEHAVIORAL SYMPTOMS/MOOD NEUROLOGICAL BOWEL NUTRITION STATUS      Continent Diet (see DC summary)  AMBULATORY STATUS COMMUNICATION OF NEEDS Skin   Extensive Assist  Verbally Normal                       Personal Care Assistance Level of Assistance  Bathing, Feeding, Dressing Bathing Assistance: Maximum assistance Feeding assistance: Limited assistance Dressing Assistance: Maximum assistance     Functional Limitations Info             SPECIAL CARE FACTORS FREQUENCY  PT (By licensed PT), OT (By licensed OT)     PT Frequency: 5x/wk OT Frequency: 5x/wk            Contractures Contractures Info: Not present    Additional Factors Info  Code Status, Allergies Code Status Info: DNR Allergies Info: Hydrochlorothiazide, Lexapro (Escitalopram Oxalate), Lexapro (Escitalopram), Lisinopril, Silodosin, Tamsulosin           Current Medications (06/29/2021):  This is the current hospital active medication list Current Facility-Administered Medications  Medication Dose Route Frequency Provider Last Rate Last Admin   acetaminophen (TYLENOL) tablet 650 mg  650 mg Oral Q6H PRN Howerter, Justin B, DO   650 mg at 06/29/21 0702   Or   acetaminophen (TYLENOL) suppository 650 mg  650 mg Rectal Q6H PRN Howerter, Justin B, DO       Ampicillin-Sulbactam (UNASYN) 3 g in sodium chloride 0.9 % 100 mL IVPB  3 g Intravenous Q6H [place], [place], [place] 200 mL/hr at 06/29/21 0355 3 g at 06/29/21 0355   apixaban (ELIQUIS) tablet 5 mg  5 mg Oral BID Howerter, Justin B, DO   5 mg  at 06/29/21 1009   feeding supplement (GLUCERNA SHAKE) (GLUCERNA SHAKE) liquid 237 mL  237 mL Oral TID BM Ghimire, Henreitta Leber, MD       Gerhardt's butt cream   Topical Q4H Ghimire, Henreitta Leber, MD       levothyroxine (SYNTHROID) tablet 137 mcg  137 mcg Oral QAC breakfast Howerter, Justin B, DO   137 mcg at 06/29/21 0703   LORazepam (ATIVAN) injection 0.5 mg  0.5 mg Intravenous Q12H Ghimire, Shanker M, MD   0.5 mg at 06/29/21 1010   pantoprazole (PROTONIX) EC tablet 40 mg  40 mg Oral Q1200 Jonetta Osgood, MD   40 mg at 06/28/21 2257   rosuvastatin (CRESTOR) tablet 10 mg  10 mg Oral  Daily Howerter, Justin B, DO   10 mg at 06/29/21 1009   tiaGABine (GABITRIL) tablet 8 mg  8 mg Oral BID Greta Doom, MD   8 mg at 06/29/21 0703     Discharge Medications: Please see discharge summary for a list of discharge medications.  Relevant Imaging Results:  Relevant Lab Results:   Additional Information SS#: [id number]  Geralynn Ochs, LCSW

## 2021-06-30 DIAGNOSIS — G9341 Metabolic encephalopathy: Secondary | ICD-10-CM | POA: Diagnosis not present

## 2021-06-30 LAB — GLUCOSE, CAPILLARY
Glucose-Capillary: 99 mg/dL (ref 70–99)
Glucose-Capillary: 112 mg/dL — ABNORMAL HIGH (ref 70–99)
Glucose-Capillary: 91 mg/dL (ref 70–99)
Glucose-Capillary: 117 mg/dL — ABNORMAL HIGH (ref 70–99)

## 2021-06-30 LAB — CBC
HCT: 40.3 % (ref 39.0–52.0)
Hemoglobin: 13.5 g/dL (ref 13.0–17.0)
MCH: 29.4 pg (ref 26.0–34.0)
MCHC: 33.5 g/dL (ref 30.0–36.0)
MCV: 87.8 fL (ref 80.0–100.0)
Platelets: 146 10*3/uL — ABNORMAL LOW (ref 150–400)
RBC: 4.59 MIL/uL (ref 4.22–5.81)
RDW: 13.7 % (ref 11.5–15.5)
WBC: 7.1 10*3/uL (ref 4.0–10.5)
nRBC: 0 % (ref 0.0–0.2)

## 2021-06-30 LAB — BASIC METABOLIC PANEL
Anion gap: 10 (ref 5–15)
BUN: 44 mg/dL — ABNORMAL HIGH (ref 8–23)
CO2: 25 mmol/L (ref 22–32)
Calcium: 9.3 mg/dL (ref 8.9–10.3)
Chloride: 102 mmol/L (ref 98–111)
Creatinine, Ser: 1.74 mg/dL — ABNORMAL HIGH (ref 0.61–1.24)
GFR, Estimated: 39 mL/min — ABNORMAL LOW (ref >60)
Glucose, Bld: 101 mg/dL — ABNORMAL HIGH (ref 70–99)
Potassium: 3.8 mmol/L (ref 3.5–5.1)
Sodium: 137 mmol/L (ref 135–145)

## 2021-06-30 LAB — PROCALCITONIN: Procalcitonin: 0.15 ng/mL

## 2021-06-30 LAB — METHYLMALONIC ACID, SERUM: Methylmalonic Acid, Quantitative: 163 nmol/L (ref 0–378)

## 2021-06-30 MED ORDER — ALPRAZOLAM 0.5 MG PO TABS
0.5000 mg | ORAL_TABLET | Freq: Two times a day (BID) | ORAL | Status: DC | PRN
  Administered 2021-06-30 – 2021-07-04 (×5): 0.5 mg via ORAL
  Filled 2021-06-30 (×5): qty 1

## 2021-06-30 MED ORDER — DIPHENHYDRAMINE HCL 50 MG/ML IJ SOLN: 25.0000 mg | Freq: Once | INTRAMUSCULAR | Status: DC

## 2021-06-30 MED ORDER — DIPHENHYDRAMINE-ZINC ACETATE 2-0.1 % EX CREA
TOPICAL_CREAM | Freq: Two times a day (BID) | CUTANEOUS | Status: DC | PRN
  Administered 2021-07-02: 1 via TOPICAL
  Filled 2021-06-30 (×4): qty 28

## 2021-06-30 NOTE — Progress Notes (Signed)
PROGRESS NOTE        PATIENT DETAILS Name: [name] Age: 81 y.o. 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  He was subsequently admitted to the hospitalist service.  See below for further details.  Significant events: 1/25>> admit to [facility] for encephalopathy/PNA/sepsis/AKI.  Significant imaging studies: 1/25>> CXR: LLL PNA 1/26>> CT head: No acute pathology  Culture data: 1/25>> blood culture: No growth 1/25>> COVID/flu PCR: Negative  Subjective Patient in bed, appears comfortable, denies any headache, no fever, no chest pain or pressure, no shortness of breath , no abdominal pain. No new focal weakness.   Objective: Vitals: Blood pressure 115/64, pulse 72, temperature 98 F (36.7 C), temperature source Oral, resp. rate 19, height 6' (1.829 m), weight 135.6 kg, SpO2 99 %.   Exam:  Awake Alert, No new F.N deficits, Normal affect [place].AT,PERRAL Supple Neck, No JVD,   Symmetrical Chest wall movement, Good air movement bilaterally, CTAB RRR,No Gallops, Rubs or new Murmurs,  +ve B.Sounds, Abd Soft, No tenderness,   Mild L upper arm allergic rash      Assessment/Plan: * Acute metabolic encephalopathy- (present on admission) Multifactorial etiology-due to PNA/AKI-in from multiple medication changes.   Clinically improved-more awake and alert compared to how he first presented to the ED.  Remains on empiric IV Unasyn-restarted on benzodiazepines on 1/26 after discussion with neurology.  No longer on Sinemet.    Anxiety disorder- (present on admission) Longstanding history of anxiety disorder-recently tapered off benzodiazepines-and started on Gabitril.  After discussion with neurology-has been started on scheduled bronchodilators with some clinical improvement in his dystonia/mental status.  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 Appreciate SLP input-starting dysphagia 1 diet thin liquids.  Watch for signs of aspiration.  OSA (obstructive sleep apnea)- (present on admission) Noncompliant to BiPAP per family.  Hypomagnesemia Repleted.  Chronic HFrEF (heart failure with reduced ejection fraction) (EF 45-50% by echo on 03/23/2021)- (present on admission) Volume status is stable-resume Demadex over the next few days.  HTN (hypertension)- (present on admission) BP slowly creeping up-we will start low-dose amlodipine-continue to hold losartan due to resolving AKI.  Hypothyroidism-TSH 1.345 on 1/23- (present on admission) Continue levothyroxine.  ?  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  Given his body habitus-lumbar puncture will be quite challenging.  No longer on Sinemet.  DW Neuro, symptoms better 1/28.   Hypercalcemia- (present on admission) Due to dehydration-resolved.  AKI on CKD stage IIIb.- (present on admission) AKI likely hemodynamically mediated-improved with supportive care-and close to baseline.  Severe sepsis due to left lower lobe pneumonia- (present on  admission) Sepsis physiology has resolved-blood cultures negative so  far-highly likely that this is aspiration pneumonia-continue Unasyn x5 days.  Generalized weakness Significant functional decline over the past 1 year-at baseline using cane/walker-however due to acute illness-nonambulatory for almost a week.  On exam-has generalized weakness but is nonfocal.  Continues to have tremors.  Suspicion for neurodegenerative issue per neurology-work-up in progress-see above.  Evaluated by PT/OT-recommendations are for SNF.  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  Paroxysmal atrial fibrillation ([place])- (present on admission) Paced rhythm-but rate controlled.  On Eliquis.  Continue telemetry monitoring.   BMI: Estimated body mass index is 40.55 kg/m as calculated from the following:   Height as of this encounter: 6' (1.829 m).   Weight as of this encounter: 135.6 kg.    DVT Prophylaxis: Eliquis Procedures: None Consults: Neurology Code Status:DNR Family Communication: Daughter-[name] over the phone-[phone] on 1/27, wife bedside 06/30/21   Disposition Plan: Status is: Inpatient  Remains inpatient appropriate because: Resolving sepsis-encephalopathy-not yet stable for discharge.     Diet: Diet Order             DIET - DYS 1 Room service appropriate? Yes; Fluid consistency: Thin; Fluid restriction: 1500 mL Fluid  Diet effective now                     Antimicrobial agents: Anti-infectives (From admission, onward)    Start     Dose/Rate Route Frequency Ordered Stop   06/28/21 2200  vancomycin (VANCOREADY) IVPB 750 mg/150 mL  Status:  Discontinued        750 mg 150 mL/hr over 60 Minutes Intravenous Every 24 hours 06/28/21 0012 06/28/21 0926   06/28/21 2200  vancomycin (VANCOCIN) IVPB 1000 mg/200 mL premix  Status:  Discontinued        1,000 mg 200 mL/hr over 60 Minutes Intravenous Every 24 hours 06/28/21 0926  06/28/21 1355   06/28/21 1430  Ampicillin-Sulbactam (UNASYN) 3 g in sodium chloride 0.9 % 100 mL IVPB        3 g 200 mL/hr over 30 Minutes Intravenous Every 6 hours 06/28/21 1417     06/28/21 1000  ceFEPIme (MAXIPIME) 2 g in sodium chloride 0.9 % 100 mL IVPB  Status:  Discontinued        2 g 200 mL/hr over 30 Minutes Intravenous Every 12 hours 06/28/21 0007 06/28/21 1355   06/27/21 2330  ceFEPIme (MAXIPIME) 2 g in sodium chloride 0.9 % 100 mL IVPB        2 g 200 mL/hr over 30 Minutes Intravenous  Once 06/27/21 2321 06/28/21 0236   06/27/21 2330  metroNIDAZOLE (FLAGYL) IVPB 500 mg        500 mg 100 mL/hr over 60 Minutes Intravenous  Once 06/27/21 2321 06/28/21 0236   06/27/21 2330  vancomycin (VANCOCIN) IVPB 1000 mg/200 mL premix  Status:  Discontinued        1,000 mg 200 mL/hr over 60 Minutes Intravenous  Once 06/27/21 2321 06/27/21 2323   06/27/21 2330  vancomycin (VANCOREADY) IVPB 2000 mg/400 mL        2,000 mg 200 mL/hr over 120 Minutes Intravenous  Once 06/27/21 2323 06/28/21 0347        MEDICATIONS: Scheduled Meds:  amLODipine  5 mg Oral Daily   apixaban  5 mg Oral BID   feeding supplement (GLUCERNA SHAKE)  237 mL Oral TID BM   Gerhardt's butt cream   Topical Q4H   levothyroxine  137 mcg Oral QAC breakfast  LORazepam  0.5 mg Oral BID   multivitamin with minerals  1 tablet Oral Daily   pantoprazole  40 mg Oral Q1200   rosuvastatin  10 mg Oral Daily   tiaGABine  8 mg Oral BID   torsemide  20 mg Oral Daily   Continuous Infusions:  ampicillin-sulbactam (UNASYN) IV 3 g (06/30/21 0629)   PRN Meds:.acetaminophen **OR** acetaminophen, albuterol, diphenhydrAMINE-zinc acetate   I have personally reviewed following labs and imaging studies  LABORATORY DATA: CBC: Recent Labs  Lab 06/27/21 2157 06/28/21 0458 06/28/21 0509 06/29/21 0240 06/30/21 0119  WBC 11.9* 8.0  --  7.9 7.1  NEUTROABS 9.9* 6.8  --   --   --   HGB 15.6 12.2* 11.6* 13.2 13.5  HCT 48.7 38.9* 34.0*  40.2 40.3  MCV 90.4 92.2  --  89.5 87.8  PLT 231 139*  --  145* 146*    Basic Metabolic Panel: Recent Labs  Lab 06/27/21 2157 06/28/21 0458 06/28/21 0509 06/29/21 0240 06/30/21 0119  NA 137 136 135 136 137  K 5.3* 4.2 4.0 4.1 3.8  CL 100 108  --  105 102  CO2 22 18*  --  22 25  GLUCOSE 155* 100*  --  86 101*  BUN 55* 51*  --  45* 44*  CREATININE 2.51* 2.01*  --  1.69* 1.74*  CALCIUM 10.4* 8.9  --  9.3 9.3  MG  --  1.6*  --  2.0  --     GFR: Estimated Creatinine Clearance: 49.1 mL/min (A) (by C-G formula based on SCr of 1.74 mg/dL (H)).  Liver Function Tests: Recent Labs  Lab 06/27/21 2157 06/28/21 0458 06/29/21 0240  AST 35 32 63*  ALT 19 15 14   ALKPHOS 75 56 58  BILITOT 2.7* 1.9* 1.5*  PROT 6.9 4.8* 5.2*  ALBUMIN 3.8 2.8* 3.0*   No results for input(s): LIPASE, AMYLASE in the last 168 hours. Recent Labs  Lab 06/28/21 0458  AMMONIA 36*    Coagulation Profile: Recent Labs  Lab 06/27/21 2157  INR 1.5*    Cardiac Enzymes: Recent Labs  Lab 06/27/21 2315 06/28/21 0458  CKTOTAL 435* 494*    BNP (last 3 results) No results for input(s): PROBNP in the last 8760 hours.  Lipid Profile: No results for input(s): CHOL, HDL, LDLCALC, TRIG, CHOLHDL, LDLDIRECT in the last 72 hours.  Thyroid Function Tests: Recent Labs    06/27/21 2315  TSH 1.345    Anemia Panel: No results for input(s): VITAMINB12, FOLATE, FERRITIN, TIBC, IRON, RETICCTPCT in the last 72 hours.  Urine analysis:    Component Value Date/Time   COLORURINE AMBER (A) 06/28/2021 0422   APPEARANCEUR HAZY (A) 06/28/2021 0422   LABSPEC 1.023 06/28/2021 0422   PHURINE 5.0 06/28/2021 0422   GLUCOSEU NEGATIVE 06/28/2021 0422   HGBUR SMALL (A) 06/28/2021 0422   BILIRUBINUR NEGATIVE 06/28/2021 0422   KETONESUR 5 (A) 06/28/2021 0422   PROTEINUR 30 (A) 06/28/2021 0422   NITRITE NEGATIVE 06/28/2021 0422   LEUKOCYTESUR NEGATIVE 06/28/2021 0422    Sepsis Labs: Lactic Acid, Venous     Component Value Date/Time   LATICACIDVEN 2.5 ([place]) 06/27/2021 2315     RADIOLOGY STUDIES/RESULTS: No results found.   LOS: 2 days   Signature  Lala Lund M.D on 06/30/2021 at 12:46 PM   -  To page go to [email or web address]

## 2021-06-30 NOTE — Plan of Care (Signed)

## 2021-06-30 NOTE — Progress Notes (Signed)
Neurology Progress Note   S:// Seen and examined.  Voices no new complaints.  Wife at bedside, reports improvement.   O:// Current vital signs: BP 132/79 (BP Location: Left Arm)    Pulse 74    Temp 97.8 F (36.6 C) (Oral)    Resp 18    Ht 6' (1.829 m)    Wt 135.6 kg    SpO2 100%    BMI 40.55 kg/m  Vital signs in last 24 hours: Temp:  [97.6 F (36.4 C)-98.4 F (36.9 C)] 97.8 F (36.6 C) (01/28 0735) Pulse Rate:  [60-79] 74 (01/28 0735) Resp:  [16-22] 18 (01/28 0735) BP: (109-146)/(51-94) 132/79 (01/28 0735) SpO2:  [97 %-100 %] 100 % (01/28 0735) Weight:  [135.6 kg] 135.6 kg (01/27 1500) General: Obese man, awake alert in no distress HEENT: Normocephalic/atraumatic CVS: Regular rate rhythm Abdomen obese, nontender Extremities warm well perfused Integumentary: Multiple patchy red areas with scaling Neurological exam Awake alert oriented x3 Mild dysarthria No evidence of aphasia Cranial nerves: 2-12 grossly unremarkable.  No tongue fasciculations Motor exam: Right arm-does have mildly increased tone but is able to raise the arm up, his fingers are flexed and difficult to open his closed fist but-this is because of a prior pulmonary injury-he does not have a flexed wrist and have been noted by Dr. Leonel Ramsay yesterday.  Left upper extremity without drift.  Both legs are barely antigravity.  I did not witness any fasciculations either at rest or on mild stimulation of the muscles in the lower extremities. Sensation: Intact DTRs are 1+ all over Plantars: There are atrophic changes on his toes but it does look like he has an upgoing toe without real finding of the other toes which could be a striatal toe.  I also question if there is arthritis and deformity making the toe appearance abnormal.   Medications  Current Facility-Administered Medications:    acetaminophen (TYLENOL) tablet 650 mg, 650 mg, Oral, Q6H PRN, 650 mg at 06/30/21 9381 **OR** acetaminophen (TYLENOL) suppository  650 mg, 650 mg, Rectal, Q6H PRN, Howerter, Justin B, DO   albuterol (PROVENTIL) (2.5 MG/3ML) 0.083% nebulizer solution 2.5 mg, 2.5 mg, Nebulization, Q6H PRN, Ghimire, Shanker M, MD   amLODipine (NORVASC) tablet 5 mg, 5 mg, Oral, Daily, Ghimire, Shanker M, MD, 5 mg at 06/30/21 0838   Ampicillin-Sulbactam (UNASYN) 3 g in sodium chloride 0.9 % 100 mL IVPB, 3 g, Intravenous, Q6H, [place], [place], , Last Rate: 200 mL/hr at 06/30/21 0629, 3 g at 06/30/21 8299   apixaban (ELIQUIS) tablet 5 mg, 5 mg, Oral, BID, Howerter, Justin B, DO, 5 mg at 06/30/21 3716   diphenhydrAMINE-zinc acetate (BENADRYL) 2-0.1 % cream, , Topical, BID PRN, Thurnell Lose, MD   feeding supplement (GLUCERNA SHAKE) (GLUCERNA SHAKE) liquid 237 mL, 237 mL, Oral, TID BM, Ghimire, Henreitta Leber, MD, 237 mL at 06/30/21 9678   Gerhardt's butt cream, , Topical, Q4H, Ghimire, Henreitta Leber, MD, Given at 06/30/21 0801   levothyroxine (SYNTHROID) tablet 137 mcg, 137 mcg, Oral, QAC breakfast, Howerter, Justin B, DO, 137 mcg at 06/30/21 9381   LORazepam (ATIVAN) tablet 0.5 mg, 0.5 mg, Oral, BID, Ghimire, Shanker M, MD, 0.5 mg at 06/30/21 0175   multivitamin with minerals tablet 1 tablet, 1 tablet, Oral, Daily, Ghimire, Henreitta Leber, MD, 1 tablet at 06/30/21 0837   pantoprazole (PROTONIX) EC tablet 40 mg, 40 mg, Oral, Q1200, Ghimire, Henreitta Leber, MD, 40 mg at 06/29/21 1255   rosuvastatin (CRESTOR) tablet 10 mg, 10  mg, Oral, Daily, Howerter, Justin B, DO, 10 mg at 06/30/21 1157   tiaGABine (GABITRIL) tablet 8 mg, 8 mg, Oral, BID, Greta Doom, MD, 8 mg at 06/30/21 2620   torsemide (DEMADEX) tablet 20 mg, 20 mg, Oral, Daily, Jonetta Osgood, MD, 20 mg at 06/30/21 0837 Labs CBC    Component Value Date/Time   WBC 7.1 06/30/2021 0119   RBC 4.59 06/30/2021 0119   HGB 13.5 06/30/2021 0119   HGB 13.4 11/19/2017 1443   HCT 40.3 06/30/2021 0119   HCT 40.6 11/19/2017 1443   PLT 146 (L) 06/30/2021 0119   PLT 197 11/19/2017 1443   MCV 87.8  06/30/2021 0119   MCV 86 11/19/2017 1443   MCH 29.4 06/30/2021 0119   MCHC 33.5 06/30/2021 0119   RDW 13.7 06/30/2021 0119   RDW 15.2 11/19/2017 1443   LYMPHSABS 0.4 (L) 06/28/2021 0458   LYMPHSABS 0.9 11/19/2017 1443   MONOABS 0.6 06/28/2021 0458   EOSABS 0.2 06/28/2021 0458   EOSABS 0.2 11/19/2017 1443   BASOSABS 0.0 06/28/2021 0458   BASOSABS 0.0 11/19/2017 1443    CMP     Component Value Date/Time   NA 137 06/30/2021 0119   NA 134 05/25/2021 1412   K 3.8 06/30/2021 0119   CL 102 06/30/2021 0119   CO2 25 06/30/2021 0119   GLUCOSE 101 (H) 06/30/2021 0119   BUN 44 (H) 06/30/2021 0119   BUN 17 05/25/2021 1412   CREATININE 1.74 (H) 06/30/2021 0119   CALCIUM 9.3 06/30/2021 0119   PROT 5.2 (L) 06/29/2021 0240   PROT 6.6 05/25/2021 1412   ALBUMIN 3.0 (L) 06/29/2021 0240   ALBUMIN 4.3 05/25/2021 1412   AST 63 (H) 06/29/2021 0240   ALT 14 06/29/2021 0240   ALKPHOS 58 06/29/2021 0240   BILITOT 1.5 (H) 06/29/2021 0240   BILITOT 1.3 (H) 05/25/2021 1412   GFRNONAA 39 (L) 06/30/2021 0119   GFRAA 43 (L) 03/18/2019 1530   Imaging I have reviewed images in epic and the results pertinent to this consultation are: CT-scan of the brain-unremarkable MRI examination of the brain-was ordered but his pacemaker and leads are MRI incompatible as reported by the radiology technologist who checked that out.  Assessment and recommendations: 81 year old man with progressively increasing stiffness, gait disorder, worsening panic and tremors presented for evaluation of increasing stiffness and rigidity as well as dysphagia. 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  He received Haldol in the emergency room and his symptoms improved making anxiety as possible underlying exacerbate her symptoms. He had multiple medication changes made  over the last couple of weeks-stopping Ativan, increasing Gabitril and starting Sinemet. Sinemet was stopped Monday but family did not notice any improvement. Gabitril can cause myoclonus and was reduced on arrival in the ER. Ativan was also started at 0.5 mg twice daily.  Obtaining an MRI would have been very helpful to assess for changes of frontotemporal dementia or cortical basal degeneration but that is unfortunately not possible.  Repeating head CT today is also not would add value as he had 1 done yesterday. An LP to evaluate the CSF for autoimmune and infectious etiologies could be considered but given his exceptionally large body habitus and current deconditioning, I am unable to position him to perform an LP at bedside.  I do not think he can lay flat on his  belly to get 1 done under IR as well.  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  He does appear to have some sort of neurodegenerative process without clear definitive diagnosis at this time and would require continued outpatient care.  In the interim, as an inpatient, I would continue him on the Ativan 0.5 twice daily.  I would continue to hold the Sinemet.  And I would also keep him on tiagabine 8 mg dose and not on 12 mg dose.  Possibly his anxiety was contributing to a lot of the symptoms but I do not think that is the complete explanation of his current symptoms but is a significant part of his presentation.  Management of his anxiety symptoms per primary team/psychiatry.  In the future, as an outpatient, if a benzo taper needs to be done, it should be done very slowly.  I would also advise against making multiple medicine changes  in a relatively short period of time to avoid potential confounding.  Patient will require placement consideration-wife is the only caretaker at home and is elderly herself.  Discussed with his wife at bedside and Dr. Candiss Norse on the floor.   -- Amie Portland, MD Neurologist [facility] Pager: [phone]

## 2021-06-30 NOTE — Plan of Care (Signed)
Pt is alert oriented x 4. Pt was restless and did not sleep. Pts wife was at bedside. Pt has been turned and repositioned. Pt has male [place] In place. Pt verbalized to wife that he felt short of breath. Oxygen has been greater than 95%. Pt placed on 2L nasal cannula for comfort and SOB.   Problem: Education: Goal: Knowledge of General Education information will improve Description: Including pain rating scale, medication(s)/side effects and non-pharmacologic comfort measures Outcome: Progressing   Problem: Health Behavior/Discharge Planning: Goal: Ability to manage health-related needs will improve Outcome: Progressing   Problem: Clinical Measurements: Goal: Ability to maintain clinical measurements within normal limits will improve Outcome: Progressing Goal: Will remain free from infection Outcome: Progressing Goal: Diagnostic test results will improve Outcome: Progressing Goal: Respiratory complications will improve Outcome: Progressing Goal: Cardiovascular complication will be avoided Outcome: Progressing   Problem: Activity: Goal: Risk for activity intolerance will decrease Outcome: Progressing   Problem: Nutrition: Goal: Adequate nutrition will be maintained Outcome: Progressing   Problem: Coping: Goal: Level of anxiety will decrease Outcome: Progressing   Problem: Elimination: Goal: Will not experience complications related to bowel motility Outcome: Progressing Goal: Will not experience complications related to urinary retention Outcome: Progressing   Problem: Pain Managment: Goal: General experience of comfort will improve Outcome: Progressing   Problem: Safety: Goal: Ability to remain free from injury will improve Outcome: Progressing   Problem: Skin Integrity: Goal: Risk for impaired skin integrity will decrease Outcome: Progressing

## 2021-06-30 NOTE — TOC Progression Note (Signed)
Transition of Care [facility]) - Progression Note    Patient Details  Name: [name] MRN: [id number] Date of Birth: 07-Oct-1940  Transition of Care [facility] CM/SW [place], [place] Phone Number: 06/30/2021, 3:22 PM  Clinical Narrative:    CSW spoke with pt's daughter who noted that they have not reviewed bed offers yet, but are waiting for [facility]. [facility] has not offered yet. CSW encouraged family to consider the offers they had received and TOC will reach out to [facility] on Monday. TOC will continue to follow for DC needs.   Expected Discharge Plan: [place] Barriers to Discharge: Continued Medical Work up, Ship broker  Expected Discharge Plan and Services Expected Discharge Plan: [place] Choice: [place] arrangements for the past 2 months: Single Family Home                                       Social Determinants of Health (SDOH) Interventions    Readmission Risk Interventions No flowsheet data found.

## 2021-06-30 NOTE — Progress Notes (Signed)
RT note. RT assisted patient with IS and flutter valve. Patient demonstrated good effort. Patient achieved aprox. 750 on IS. RT will continue to monitor.

## 2021-07-01 ENCOUNTER — Inpatient Hospital Stay (HOSPITAL_COMMUNITY): Payer: Medicare HMO

## 2021-07-01 DIAGNOSIS — G9341 Metabolic encephalopathy: Secondary | ICD-10-CM | POA: Diagnosis not present

## 2021-07-01 LAB — COMPREHENSIVE METABOLIC PANEL
ALT: 21 U/L (ref 0–44)
AST: 32 U/L (ref 15–41)
Albumin: 2.7 g/dL — ABNORMAL LOW (ref 3.5–5.0)
Alkaline Phosphatase: 56 U/L (ref 38–126)
Anion gap: 10 (ref 5–15)
BUN: 41 mg/dL — ABNORMAL HIGH (ref 8–23)
CO2: 28 mmol/L (ref 22–32)
Calcium: 9.1 mg/dL (ref 8.9–10.3)
Chloride: 98 mmol/L (ref 98–111)
Creatinine, Ser: 1.82 mg/dL — ABNORMAL HIGH (ref 0.61–1.24)
GFR, Estimated: 37 mL/min — ABNORMAL LOW (ref >60)
Glucose, Bld: 100 mg/dL — ABNORMAL HIGH (ref 70–99)
Potassium: 3.6 mmol/L (ref 3.5–5.1)
Sodium: 136 mmol/L (ref 135–145)
Total Bilirubin: 1.5 mg/dL — ABNORMAL HIGH (ref 0.3–1.2)
Total Protein: 4.6 g/dL — ABNORMAL LOW (ref 6.5–8.1)

## 2021-07-01 LAB — CBC WITH DIFFERENTIAL/PLATELET
Abs Immature Granulocytes: 0.03 10*3/uL (ref 0.00–0.07)
Basophils Absolute: 0 10*3/uL (ref 0.0–0.1)
Basophils Relative: 1 %
Eosinophils Absolute: 0.5 10*3/uL (ref 0.0–0.5)
Eosinophils Relative: 7 %
HCT: 39.6 % (ref 39.0–52.0)
Hemoglobin: 13.5 g/dL (ref 13.0–17.0)
Immature Granulocytes: 1 %
Lymphocytes Relative: 8 %
Lymphs Abs: 0.5 10*3/uL — ABNORMAL LOW (ref 0.7–4.0)
MCH: 29.9 pg (ref 26.0–34.0)
MCHC: 34.1 g/dL (ref 30.0–36.0)
MCV: 87.8 fL (ref 80.0–100.0)
Monocytes Absolute: 0.7 10*3/uL (ref 0.1–1.0)
Monocytes Relative: 11 %
Neutro Abs: 4.7 10*3/uL (ref 1.7–7.7)
Neutrophils Relative %: 72 %
Platelets: 141 10*3/uL — ABNORMAL LOW (ref 150–400)
RBC: 4.51 MIL/uL (ref 4.22–5.81)
RDW: 13.7 % (ref 11.5–15.5)
WBC: 6.4 10*3/uL (ref 4.0–10.5)
nRBC: 0 % (ref 0.0–0.2)

## 2021-07-01 LAB — GLUCOSE, CAPILLARY
Glucose-Capillary: 105 mg/dL — ABNORMAL HIGH (ref 70–99)
Glucose-Capillary: 92 mg/dL (ref 70–99)
Glucose-Capillary: 112 mg/dL — ABNORMAL HIGH (ref 70–99)
Glucose-Capillary: 102 mg/dL — ABNORMAL HIGH (ref 70–99)

## 2021-07-01 LAB — MAGNESIUM: Magnesium: 1.6 mg/dL — ABNORMAL LOW (ref 1.7–2.4)

## 2021-07-01 LAB — PROCALCITONIN: Procalcitonin: 0.1 ng/mL

## 2021-07-01 LAB — BRAIN NATRIURETIC PEPTIDE: B Natriuretic Peptide: 208.1 pg/mL — ABNORMAL HIGH (ref 0.0–100.0)

## 2021-07-01 LAB — C-REACTIVE PROTEIN: CRP: 4.3 mg/dL — ABNORMAL HIGH (ref <1.0)

## 2021-07-01 MED ORDER — TORSEMIDE 20 MG PO TABS
20.0000 mg | ORAL_TABLET | Freq: Every day | ORAL | Status: DC
  Administered 2021-07-02 – 2021-07-04 (×3): 20 mg via ORAL
  Filled 2021-07-01 (×4): qty 1

## 2021-07-01 MED ORDER — MAGNESIUM SULFATE 2 GM/50ML IV SOLN
2.0000 g | Freq: Once | INTRAVENOUS | Status: AC
  Administered 2021-07-01: 2 g via INTRAVENOUS
  Filled 2021-07-01: qty 50

## 2021-07-01 MED ORDER — POTASSIUM CHLORIDE CRYS ER 20 MEQ PO TBCR
40.0000 meq | EXTENDED_RELEASE_TABLET | Freq: Once | ORAL | Status: AC
  Administered 2021-07-01: 40 meq via ORAL
  Filled 2021-07-01: qty 2

## 2021-07-01 NOTE — Progress Notes (Signed)
PROGRESS NOTE        PATIENT DETAILS Name: [name] Age: 81 y.o. 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  He was subsequently admitted to the hospitalist service.  See below for further details.  Significant events: 1/25>> admit to [facility] for encephalopathy/PNA/sepsis/AKI.  Significant imaging studies: 1/25>> CXR: LLL PNA 1/26>> CT head: No acute pathology  Culture data: 1/25>> blood culture: No growth 1/25>> COVID/flu PCR: Negative  Subjective  Patient in bed, appears comfortable, denies any headache, no fever, no chest pain or pressure, no shortness of breath , no abdominal pain. No new focal weakness.    Objective: Vitals: Blood pressure 113/66, pulse (!) 106, temperature 97.6 F (36.4 C), temperature source Oral, resp. rate 20, height 6' (1.829 m), weight 128 kg, SpO2 96 %.   Exam:  Awake Alert, No new F.N deficits, Normal affect [place].AT,PERRAL Supple Neck, No JVD,   Symmetrical Chest wall movement, Good air movement bilaterally, CTAB RRR,No Gallops, Rubs or new Murmurs,  +ve B.Sounds, Abd Soft, No tenderness,   No Cyanosis, Clubbing or edema     Assessment/Plan: * Acute metabolic encephalopathy- (present on admission) Multifactorial etiology-due to PNA/AKI-in from multiple medication  changes.  Clinically improved-more awake and alert compared to how he first presented to the ED.  Remains on empiric IV Unasyn-restarted on benzodiazepines on 1/26 after discussion with neurology.  No longer on Sinemet.    Anxiety disorder- (present on admission) Longstanding history of anxiety disorder-recently tapered off benzodiazepines-and started on Gabitril.  After discussion with neurology-has been started on scheduled bronchodilators with some clinical improvement in his dystonia/mental status.  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 Appreciate SLP input-starting dysphagia 1 diet thin liquids.  Watch for signs of aspiration.  OSA (obstructive sleep apnea)- (present on admission) Noncompliant to BiPAP per family.  Hypomagnesemia Repleted.  Chronic HFrEF (heart failure with reduced ejection fraction) (EF 45-50% by echo on 03/23/2021)- (present on admission) Volume status is stable-resume Demadex over the next few days.  HTN (hypertension)- (present on admission) BP slowly creeping up-we will start low-dose amlodipine-continue to hold losartan due to resolving AKI.  Hypothyroidism-TSH 1.345 on 1/23- (present on admission) Continue levothyroxine.  ?  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  Given his body habitus-lumbar puncture will be quite challenging.  No longer on Sinemet.  Overall symptoms have improved, discussed with neurology team on 07/01/2021 keep on this current medication regimen and discharged to SNF once bed is available   Hypercalcemia- (present on admission) Due to dehydration-resolved.  AKI on CKD stage IIIb.- (present on admission) AKI likely  hemodynamically mediated-improved with supportive care-and close to  baseline.  Severe sepsis due to left lower lobe pneumonia- (present on admission) Sepsis physiology has resolved-blood cultures negative so far-highly likely that this is aspiration pneumonia-continue Unasyn x5 days.  Repeat chest x-ray on 07/01/2020.  Generalized weakness Significant functional decline over the past 1 year-at baseline using cane/walker-however due to acute illness-nonambulatory for almost a week.  On exam-has generalized weakness but is nonfocal.  Continues to have tremors.  Suspicion for neurodegenerative issue per neurology-work-up in progress-see above.  Evaluated by PT/OT-recommendations are for SNF.  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  Paroxysmal atrial fibrillation ([place])- (present on admission) Paced rhythm-but rate controlled.  On Eliquis.  Continue telemetry monitoring.   BMI: Estimated body mass index is 38.27 kg/m as calculated from the following:   Height as of this encounter: 6' (1.829 m).   Weight as of this encounter: 128 kg.    DVT Prophylaxis: Eliquis Procedures: None Consults: Neurology Code Status:DNR Family Communication: Daughter-Tammy over the [phone] on 1/27, wife bedside 06/30/21   Disposition Plan: Status is: Inpatient - SNF once bed available.     Diet: Diet Order             DIET - DYS 1 Room service appropriate? Yes; Fluid consistency: Thin; Fluid restriction: 1500 mL Fluid  Diet effective now                     Antimicrobial agents: Anti-infectives (From admission, onward)    Start     Dose/Rate Route Frequency Ordered Stop   06/28/21 2200  vancomycin (VANCOREADY) IVPB 750 mg/150 mL  Status:  Discontinued        750 mg 150 mL/hr over 60 Minutes Intravenous Every 24 hours 06/28/21 0012 06/28/21 0926   06/28/21 2200  vancomycin (VANCOCIN) IVPB 1000 mg/200 mL premix  Status:   Discontinued        1,000 mg 200 mL/hr over 60 Minutes Intravenous Every 24 hours 06/28/21 0926 06/28/21 1355   06/28/21 1430  Ampicillin-Sulbactam (UNASYN) 3 g in sodium chloride 0.9 % 100 mL IVPB        3 g 200 mL/hr over 30 Minutes Intravenous Every 6 hours 06/28/21 1417     06/28/21 1000  ceFEPIme (MAXIPIME) 2 g in sodium chloride 0.9 % 100 mL IVPB  Status:  Discontinued        2 g 200 mL/hr over 30 Minutes Intravenous Every 12 hours 06/28/21 0007 06/28/21 1355   06/27/21 2330  ceFEPIme (MAXIPIME) 2 g in sodium chloride 0.9 % 100 mL IVPB        2 g 200 mL/hr over 30 Minutes Intravenous  Once 06/27/21 2321 06/28/21 0236   06/27/21 2330  metroNIDAZOLE (FLAGYL) IVPB 500 mg        500 mg 100 mL/hr over 60 Minutes Intravenous  Once 06/27/21 2321 06/28/21 0236   06/27/21 2330  vancomycin (VANCOCIN) IVPB 1000 mg/200 mL premix  Status:  Discontinued        1,000 mg 200 mL/hr over 60 Minutes Intravenous  Once 06/27/21 2321 06/27/21 2323   06/27/21 2330  vancomycin (VANCOREADY) IVPB 2000 mg/400 mL        2,000 mg 200 mL/hr over 120 Minutes Intravenous  Once 06/27/21 2323 06/28/21 0347        MEDICATIONS: Scheduled Meds:  amLODipine  5 mg Oral Daily   apixaban  5 mg Oral BID   feeding supplement (GLUCERNA SHAKE)  237 mL Oral  TID BM   Gerhardt's butt cream   Topical Q4H   levothyroxine  137 mcg Oral QAC breakfast   LORazepam  0.5 mg Oral BID   multivitamin with minerals  1 tablet Oral Daily   pantoprazole  40 mg Oral Q1200   potassium chloride  40 mEq Oral Once   rosuvastatin  10 mg Oral Daily   tiaGABine  8 mg Oral BID   [START ON 07/02/2021] torsemide  20 mg Oral Daily   Continuous Infusions:  ampicillin-sulbactam (UNASYN) IV 3 g (07/01/21 0617)   magnesium sulfate bolus IVPB     PRN Meds:.acetaminophen **OR** acetaminophen, albuterol, ALPRAZolam, diphenhydrAMINE-zinc acetate   I have personally reviewed following labs and imaging studies  LABORATORY DATA: CBC: Recent  Labs  Lab 06/27/21 2157 06/28/21 0458 06/28/21 0509 06/29/21 0240 06/30/21 0119 07/01/21 0139  WBC 11.9* 8.0  --  7.9 7.1 6.4  NEUTROABS 9.9* 6.8  --   --   --  4.7  HGB 15.6 12.2* 11.6* 13.2 13.5 13.5  HCT 48.7 38.9* 34.0* 40.2 40.3 39.6  MCV 90.4 92.2  --  89.5 87.8 87.8  PLT 231 139*  --  145* 146* 141*    Basic Metabolic Panel: Recent Labs  Lab 06/27/21 2157 06/28/21 0458 06/28/21 0509 06/29/21 0240 06/30/21 0119 07/01/21 0139  NA 137 136 135 136 137 136  K 5.3* 4.2 4.0 4.1 3.8 3.6  CL 100 108  --  105 102 98  CO2 22 18*  --  22 25 28   GLUCOSE 155* 100*  --  86 101* 100*  BUN 55* 51*  --  45* 44* 41*  CREATININE 2.51* 2.01*  --  1.69* 1.74* 1.82*  CALCIUM 10.4* 8.9  --  9.3 9.3 9.1  MG  --  1.6*  --  2.0  --  1.6*    GFR: Estimated Creatinine Clearance: 45.5 mL/min (A) (by C-G formula based on SCr of 1.82 mg/dL (H)).  Liver Function Tests: Recent Labs  Lab 06/27/21 2157 06/28/21 0458 06/29/21 0240 07/01/21 0139  AST 35 32 63* 32  ALT 19 15 14 21   ALKPHOS 75 56 58 56  BILITOT 2.7* 1.9* 1.5* 1.5*  PROT 6.9 4.8* 5.2* 4.6*  ALBUMIN 3.8 2.8* 3.0* 2.7*   No results for input(s): LIPASE, AMYLASE in the last 168 hours. Recent Labs  Lab 06/28/21 0458  AMMONIA 36*    Coagulation Profile: Recent Labs  Lab 06/27/21 2157  INR 1.5*    Cardiac Enzymes: Recent Labs  Lab 06/27/21 2315 06/28/21 0458  CKTOTAL 435* 494*    BNP (last 3 results) No results for input(s): PROBNP in the last 8760 hours.  Lipid Profile: No results for input(s): CHOL, HDL, LDLCALC, TRIG, CHOLHDL, LDLDIRECT in the last 72 hours.  Thyroid Function Tests: No results for input(s): TSH, T4TOTAL, FREET4, T3FREE, THYROIDAB in the last 72 hours.   Anemia Panel: No results for input(s): VITAMINB12, FOLATE, FERRITIN, TIBC, IRON, RETICCTPCT in the last 72 hours.  Urine analysis:    Component Value Date/Time   COLORURINE AMBER (A) 06/28/2021 0422   APPEARANCEUR HAZY (A)  06/28/2021 0422   LABSPEC 1.023 06/28/2021 0422   PHURINE 5.0 06/28/2021 0422   GLUCOSEU NEGATIVE 06/28/2021 0422   HGBUR SMALL (A) 06/28/2021 0422   BILIRUBINUR NEGATIVE 06/28/2021 0422   KETONESUR 5 (A) 06/28/2021 0422   PROTEINUR 30 (A) 06/28/2021 0422   NITRITE NEGATIVE 06/28/2021 0422   LEUKOCYTESUR NEGATIVE 06/28/2021 0422    Sepsis Labs: Lactic  Acid, Venous    Component Value Date/Time   LATICACIDVEN 2.5 (HH) 06/27/2021 2315     RADIOLOGY STUDIES/RESULTS: No results found.   LOS: 3 days   Signature  Lala Lund M.D on 07/01/2021 at 9:10 AM   -  To page go to [email or web address]

## 2021-07-01 NOTE — Progress Notes (Signed)
Pharmacy Antibiotic Note  [name] is a 81 y.o. male admitted on 06/27/2021 with altered mental status and concern for  Aspiration pneumonia . PMH significant for Parkinson's Disease. Pharmacy has been consulted for Ampicillin-sulbactam dosing.  WBC improved during admission to 6.4 and patient remains afebrile. Renal function variable during admission, currently 1.82, remains in AKI. Starting dysphagia 1 diet, thin liquids will continue to monitor for signs of aspiration.   Plan: Continue Ampicillin-sulbactam 3g IV q6h Monitor for signs of clinical improvement, treatment plan, and renal fxn   Height: 6' (182.9 cm) Weight: 128 kg (282 lb 3 oz) IBW/kg (Calculated) : 77.6  Temp (24hrs), Avg:97.9 F (36.6 C), Min:97.6 F (36.4 C), Max:98.4 F (36.9 C)  Recent Labs  Lab 06/27/21 2157 06/27/21 2315 06/28/21 0458 06/29/21 0240 06/30/21 0119 07/01/21 0139  WBC 11.9*  --  8.0 7.9 7.1 6.4  CREATININE 2.51*  --  2.01* 1.69* 1.74* 1.82*  LATICACIDVEN 4.4* 2.5*  --   --   --   --      Estimated Creatinine Clearance: 45.5 mL/min (A) (by C-G formula based on SCr of 1.82 mg/dL (H)).     Antimicrobials this admission: 1/26 ampicillin-sulbactam >> 1/25 cefepime >> 1/26 1/25 metronidazole x 1 1/25 vancomycin >> 1/26  Dose adjustments this admission: none  Microbiology results: 1/25 BCx: ngtd  1/27 MRSA PCR: negative  1/25 Ucx: ngtd  Thank you for involving pharmacy in this patient's care. Cephus Slater, PharmD, MBA Pharmacy Resident [phone] 07/01/2021 10:47 AM  **Pharmacist phone directory can be found on [email or web address] listed under [place]**

## 2021-07-01 NOTE — Plan of Care (Signed)
Pt is currently resting with eyes closed. No distress noted. Pt is on cardiac monitor and continuous pulse ox. IV antibiotics given per order. Pt has been repositioned.   Problem: Education: Goal: Knowledge of General Education information will improve Description: Including pain rating scale, medication(s)/side effects and non-pharmacologic comfort measures Outcome: Progressing   Problem: Health Behavior/Discharge Planning: Goal: Ability to manage health-related needs will improve Outcome: Progressing   Problem: Clinical Measurements: Goal: Ability to maintain clinical measurements within normal limits will improve Outcome: Progressing Goal: Will remain free from infection Outcome: Progressing Goal: Diagnostic test results will improve Outcome: Progressing Goal: Respiratory complications will improve Outcome: Progressing Goal: Cardiovascular complication will be avoided Outcome: Progressing   Problem: Activity: Goal: Risk for activity intolerance will decrease Outcome: Progressing   Problem: Nutrition: Goal: Adequate nutrition will be maintained Outcome: Progressing   Problem: Coping: Goal: Level of anxiety will decrease Outcome: Progressing   Problem: Elimination: Goal: Will not experience complications related to bowel motility Outcome: Progressing Goal: Will not experience complications related to urinary retention Outcome: Progressing   Problem: Pain Managment: Goal: General experience of comfort will improve Outcome: Progressing   Problem: Safety: Goal: Ability to remain free from injury will improve Outcome: Progressing   Problem: Skin Integrity: Goal: Risk for impaired skin integrity will decrease Outcome: Progressing

## 2021-07-01 NOTE — Plan of Care (Signed)
°  Problem: Education: Goal: Knowledge of General Education information will improve Description: Including pain rating scale, medication(s)/side effects and non-pharmacologic comfort measures Outcome: Progressing   Problem: Health Behavior/Discharge Planning: Goal: Ability to manage health-related needs will improve Outcome: Progressing   Problem: Clinical Measurements: Goal: Ability to maintain clinical measurements within normal limits will improve Outcome: Progressing Goal: Will remain free from infection Outcome: Progressing Goal: Diagnostic test results will improve Outcome: Progressing Goal: Respiratory complications will improve Outcome: Progressing Goal: Cardiovascular complication will be avoided Outcome: Progressing   Problem: Activity: Goal: Risk for activity intolerance will decrease Outcome: Progressing   Problem: Nutrition: Goal: Adequate nutrition will be maintained Outcome: Progressing   Problem: Coping: Goal: Level of anxiety will decrease Outcome: Progressing   Problem: Elimination: Goal: Will not experience complications related to bowel motility Outcome: Progressing Goal: Will not experience complications related to urinary retention Outcome: Progressing   Problem: Pain Managment: Goal: General experience of comfort will improve Outcome: Progressing   Problem: Safety: Goal: Ability to remain free from injury will improve Outcome: Progressing   Problem: Skin Integrity: Goal: Risk for impaired skin integrity will decrease Outcome: Progressing   Problem: Skin Integrity: Goal: Risk for impaired skin integrity will decrease Outcome: Progressing   Problem: Pain Managment: Goal: General experience of comfort will improve Outcome: Progressing

## 2021-07-02 DIAGNOSIS — G9341 Metabolic encephalopathy: Secondary | ICD-10-CM | POA: Diagnosis not present

## 2021-07-02 LAB — GLUCOSE, CAPILLARY
Glucose-Capillary: 99 mg/dL (ref 70–99)
Glucose-Capillary: 76 mg/dL (ref 70–99)
Glucose-Capillary: 86 mg/dL (ref 70–99)

## 2021-07-02 LAB — CBC WITH DIFFERENTIAL/PLATELET
Abs Immature Granulocytes: 0.03 10*3/uL (ref 0.00–0.07)
Basophils Absolute: 0 10*3/uL (ref 0.0–0.1)
Basophils Relative: 1 %
Eosinophils Absolute: 0.6 10*3/uL — ABNORMAL HIGH (ref 0.0–0.5)
Eosinophils Relative: 9 %
HCT: 42.8 % (ref 39.0–52.0)
Hemoglobin: 13.6 g/dL (ref 13.0–17.0)
Immature Granulocytes: 1 %
Lymphocytes Relative: 8 %
Lymphs Abs: 0.5 10*3/uL — ABNORMAL LOW (ref 0.7–4.0)
MCH: 28.7 pg (ref 26.0–34.0)
MCHC: 31.8 g/dL (ref 30.0–36.0)
MCV: 90.3 fL (ref 80.0–100.0)
Monocytes Absolute: 0.6 10*3/uL (ref 0.1–1.0)
Monocytes Relative: 10 %
Neutro Abs: 4.4 10*3/uL (ref 1.7–7.7)
Neutrophils Relative %: 71 %
Platelets: 123 10*3/uL — ABNORMAL LOW (ref 150–400)
RBC: 4.74 MIL/uL (ref 4.22–5.81)
RDW: 13.8 % (ref 11.5–15.5)
WBC: 6.2 10*3/uL (ref 4.0–10.5)
nRBC: 0 % (ref 0.0–0.2)

## 2021-07-02 LAB — COMPREHENSIVE METABOLIC PANEL
ALT: 30 U/L (ref 0–44)
AST: 40 U/L (ref 15–41)
Albumin: 2.8 g/dL — ABNORMAL LOW (ref 3.5–5.0)
Alkaline Phosphatase: 66 U/L (ref 38–126)
Anion gap: 11 (ref 5–15)
BUN: 46 mg/dL — ABNORMAL HIGH (ref 8–23)
CO2: 26 mmol/L (ref 22–32)
Calcium: 9.2 mg/dL (ref 8.9–10.3)
Chloride: 100 mmol/L (ref 98–111)
Creatinine, Ser: 1.81 mg/dL — ABNORMAL HIGH (ref 0.61–1.24)
GFR, Estimated: 38 mL/min — ABNORMAL LOW (ref >60)
Glucose, Bld: 97 mg/dL (ref 70–99)
Potassium: 4.4 mmol/L (ref 3.5–5.1)
Sodium: 137 mmol/L (ref 135–145)
Total Bilirubin: 1.5 mg/dL — ABNORMAL HIGH (ref 0.3–1.2)
Total Protein: 5.4 g/dL — ABNORMAL LOW (ref 6.5–8.1)

## 2021-07-02 LAB — GLUTAMIC ACID DECARBOXYLASE AUTO ABS: Glutamic Acid Decarb Ab: <5 U/mL (ref 0.0–5.0)

## 2021-07-02 LAB — RESP PANEL BY RT-PCR (FLU A&B, COVID) ARPGX2
Influenza A by PCR: NEGATIVE
Influenza B by PCR: NEGATIVE
SARS Coronavirus 2 by RT PCR: NEGATIVE

## 2021-07-02 LAB — CULTURE, BLOOD (ROUTINE X 2)
Culture: NO GROWTH
Culture: NO GROWTH
Special Requests: ADEQUATE
Special Requests: ADEQUATE

## 2021-07-02 LAB — MISC LABCORP TEST (SEND OUT): Labcorp test code: 842104

## 2021-07-02 LAB — BRAIN NATRIURETIC PEPTIDE: B Natriuretic Peptide: 205.7 pg/mL — ABNORMAL HIGH (ref 0.0–100.0)

## 2021-07-02 LAB — C-REACTIVE PROTEIN: CRP: 2.8 mg/dL — ABNORMAL HIGH (ref <1.0)

## 2021-07-02 LAB — MAGNESIUM: Magnesium: 2 mg/dL (ref 1.7–2.4)

## 2021-07-02 MED ORDER — INFLUENZA VAC A&B SA ADJ QUAD 0.5 ML IM PRSY
0.5000 mL | PREFILLED_SYRINGE | INTRAMUSCULAR | Status: DC
  Filled 2021-07-02: qty 0.5

## 2021-07-02 MED ORDER — PNEUMOCOCCAL VAC POLYVALENT 25 MCG/0.5ML IJ INJ
0.5000 mL | INJECTION | INTRAMUSCULAR | Status: DC
  Filled 2021-07-02: qty 0.5

## 2021-07-02 NOTE — Plan of Care (Signed)
Pt is alert oriented x 4. Pt c/o pain to buttocks, pt has been turned and repositioned several times. No distress noted. Pt was placed on 2L nasal cannula due to O2 dropping while sleeping.   Problem: Education: Goal: Knowledge of General Education information will improve Description: Including pain rating scale, medication(s)/side effects and non-pharmacologic comfort measures Outcome: Progressing   Problem: Health Behavior/Discharge Planning: Goal: Ability to manage health-related needs will improve Outcome: Progressing   Problem: Clinical Measurements: Goal: Ability to maintain clinical measurements within normal limits will improve Outcome: Progressing Goal: Will remain free from infection Outcome: Progressing Goal: Diagnostic test results will improve Outcome: Progressing Goal: Respiratory complications will improve Outcome: Progressing Goal: Cardiovascular complication will be avoided Outcome: Progressing   Problem: Activity: Goal: Risk for activity intolerance will decrease Outcome: Progressing   Problem: Nutrition: Goal: Adequate nutrition will be maintained Outcome: Progressing   Problem: Coping: Goal: Level of anxiety will decrease Outcome: Progressing   Problem: Elimination: Goal: Will not experience complications related to bowel motility Outcome: Progressing Goal: Will not experience complications related to urinary retention Outcome: Progressing   Problem: Pain Managment: Goal: General experience of comfort will improve Outcome: Progressing   Problem: Safety: Goal: Ability to remain free from injury will improve Outcome: Progressing   Problem: Skin Integrity: Goal: Risk for impaired skin integrity will decrease Outcome: Progressing

## 2021-07-02 NOTE — TOC Progression Note (Signed)
Transition of Care [facility] - Progression Note    Patient Details  Name: [name] MRN: [id number] Date of Birth: 1941-01-18  Transition of Care [facility]) CM/SW [place], [place] Phone Number: 07/02/2021, 11:51 AM  Clinical Narrative:    CSW spoke with [facility], and they are unable to offer a bed for the patient. CSW spoke with daughter to update, and she said to speak with her mom who was coming to the hospital. CSW met with wife at bedside, she chose [facility]. [facility] can accept tomorrow, pending medical stability and insurance approval. CSW to start insurance authorization request after therapy updates are entered in. CSW to follow.    Expected Discharge Plan: [place] Barriers to Discharge: Continued Medical Work up, Ship broker  Expected Discharge Plan and Services Expected Discharge Plan: [place] Choice: [place] arrangements for the past 2 months: Single Family Home                                       Social Determinants of Health (SDOH) Interventions    Readmission Risk Interventions No flowsheet data found.

## 2021-07-02 NOTE — Progress Notes (Signed)
Physical Therapy Treatment Patient Details Name: [name] MRN: [id number] DOB: 1940-06-11 Today's Date: 07/02/2021   History of Present Illness Pt is a 81 y/o male admitted 1/25 with AMS and admitted with  acute metabolic encephalopathy-in the setting of sepsis due to pneumonia and multiple medication changes.  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 Other  PMH includes: CKD, DM 2, HTN, pacemaker.    PT Comments    Pt making good progress today.  Improved transfers to EOB with some goals met and updated.  Did still require assist of 2 for transfer to EOB due to pt's size and level of assist and not able to progress to standing, but if continues to improve - likely progressing to standing or OOB transfers soon.  Session focused on supine exercises to improve ROM/decrease stiffness in trunk (rotations) and legs prior to sitting.  Once in sitting able to progress balance and participate with exercises and reaching activities.  Continue plan of care.    Recommendations for follow up therapy are one component of a multi-disciplinary discharge planning process, led by the attending physician.  Recommendations may be updated based on patient status, additional functional criteria and insurance authorization.  Follow Up Recommendations  Skilled nursing-short term rehab (<3 hours/day)     Assistance Recommended at Discharge Frequent or constant Supervision/Assistance  Patient can return home with the following Two people to help with walking and/or transfers;Two people to help with bathing/dressing/bathroom;Assistance with cooking/housework;Direct supervision/assist for medications management;Direct supervision/assist for financial management;Assist for  transportation;Help with stairs or ramp for entrance   Equipment Recommendations  Wheelchair cushion (measurements PT);Wheelchair (measurements PT);Hospital bed;Other (comment) (hoyer lift wtih pad)    Recommendations for Other Services       Precautions / Restrictions Precautions Precautions: Fall     Mobility  Bed Mobility Overal bed mobility: Needs Assistance Bed Mobility: Sidelying to Sit, Sit to Sidelying   Sidelying to sit: Mod assist, HOB elevated, +2 for physical assistance     Sit to sidelying: Mod assist, HOB elevated, +2 for physical assistance General bed mobility comments: Cued pt for reaching across to rail, using log roll technique, then pushing up with UE - pt with good participation but did require overall assist of 2 due to size.  Returned to sidelying with cues to lay down on side then assisted with legs.  Pt able to pull self up in bed in trendlenberg position with assist to place hands on rail and flex legs.    Transfers     Transfers: Bed to chair/wheelchair/BSC            Lateral/Scoot Transfers: Max assist, +2 physical assistance General transfer comment: Pt remains too weak to attempt standing or lateral scoot to chair.  Did lateral scoot at EOB but cued for hand placement, pushing with feet, assist to move feet, and max x 2 using pads    Ambulation/Gait                   Stairs             Wheelchair Mobility    Modified Rankin (Stroke Patients Only)       Balance Overall balance assessment: Needs assistance Sitting-balance support: Single extremity supported, No upper extremity supported Sitting balance-Leahy Scale: Fair Sitting balance - Comments: Started  with pt holding onto foot board with R UE but progressed to supervision with hands in lap.  Sat EOB 10-15 mins.  Worked on reaching forward, downward, across body with both hands x 5 each direction       Standing balance comment: unable                             Cognition Arousal/Alertness: Awake/alert Behavior During Therapy: Flat affect Overall Cognitive Status: Impaired/Different from baseline Area of Impairment: Orientation, Attention, Following commands, Problem solving                 Orientation Level: Disoriented to, Time, Situation Current Attention Level: Selective   Following Commands: Follows one step commands with increased time     Problem Solving: Slow processing, Decreased initiation, Difficulty sequencing, Requires verbal cues, Requires tactile cues          Exercises General Exercises - Lower Extremity Ankle Circles/Pumps: AAROM, Both, 10 reps, Supine (increased cues, more active motion on L compared to R) Long Arc Quad: AROM, Both, 10 reps, Seated (with visual target) Heel Slides: AAROM, Both, 10 reps, Supine Other Exercises Other Exercises: Reaching and trunk rotation in supine with min A x 5 each direction    General Comments        Pertinent Vitals/Pain Pain Assessment Pain Assessment: No/denies pain    Home Living                          Prior Function            PT Goals (current goals can now be found in the care plan section) Progress towards PT goals: Progressing toward goals    Frequency    Min 2X/week      PT Plan Current plan remains appropriate    Co-evaluation              AM-PAC PT "6 Clicks" Mobility   Outcome Measure  Help needed turning from your back to your side while in a flat bed without using bedrails?: A Mike Help needed moving from lying on your back to sitting on the side of a flat bed without using bedrails?: Total Help needed moving to and from a bed to a chair (including a wheelchair)?: Total Help needed standing up from a chair using your arms (e.g., wheelchair or bedside chair)?: Total Help needed to walk in hospital room?: Total Help needed climbing 3-5 steps with a railing? : Total 6 Click Score: 7    End of Session    Activity Tolerance: Patient tolerated treatment well Patient left: in bed;with call bell/phone within reach;with bed alarm set;with family/visitor present Nurse Communication: Mobility status PT Visit Diagnosis: Other abnormalities of gait and mobility (R26.89);Unsteadiness on feet (R26.81);Muscle weakness (generalized) (M62.81);Difficulty in walking, not elsewhere classified (R26.2)     Time: 2952-8413 PT Time Calculation (min) (ACUTE ONLY): 28 min  Charges:  $Therapeutic Exercise: 8-22 mins $Therapeutic Activity: 8-22 mins                     Abran Richard, PT Acute Rehab Services Pager [phone] [facility] [phone]    Karlton Lemon 07/02/2021, 4:14 PM

## 2021-07-02 NOTE — Progress Notes (Signed)
PROGRESS NOTE        PATIENT DETAILS Name: [name] Age: 81 y.o. 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  He was subsequently admitted to the hospitalist service.  See below for further details.  Significant events: 1/25>> admit to [facility] for encephalopathy/PNA/sepsis/AKI.  Significant imaging studies: 1/25>> CXR: LLL PNA 1/26>> CT head: No acute pathology  Culture data: 1/25>> blood culture: No growth 1/25>> COVID/flu PCR: Negative  Subjective -  Patient in bed, appears comfortable, denies any headache, no fever, no chest pain or pressure, no shortness of breath , no abdominal pain. No new focal weakness.  Objective: Vitals: Blood pressure 138/71, pulse 60, temperature 97.6 F (36.4 C), temperature source Oral, resp. rate 20, height 6' (1.829 m), weight 128 kg, SpO2 100 %.   Exam:  Awake Alert, No new F.N deficits, Normal affect [place].AT,PERRAL Supple Neck, No JVD,   Symmetrical Chest wall movement, Good air movement bilaterally, CTAB RRR,No Gallops, Rubs or new Murmurs,  +ve B.Sounds, Abd Soft, No tenderness,   No Cyanosis, Clubbing or edema      Assessment/Plan: * Acute metabolic encephalopathy- (present on admission) Multifactorial etiology-due to PNA/AKI-in from multiple medication changes.   Clinically improved-more awake and alert compared to how he first presented to the ED.  Remains on empiric IV Unasyn-restarted on benzodiazepines on 1/26 after discussion with neurology.  No longer on Sinemet.    Anxiety disorder- (present on admission) Longstanding history of anxiety disorder-recently tapered off benzodiazepines-and started on Gabitril.  After discussion with neurology-has been started on scheduled bronchodilators with some clinical improvement in his dystonia/mental status.  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 Appreciate SLP input-starting dysphagia 1 diet thin liquids.  Watch for signs of aspiration.  OSA (obstructive sleep apnea)- (present on admission) Noncompliant to BiPAP per family.  Hypomagnesemia Repleted.  Chronic HFrEF (heart failure with reduced ejection fraction) (EF 45-50% by echo on 03/23/2021)- (present on admission) Volume status is stable-resumed Demadex.  HTN (hypertension)- (present on admission) BP slowly creeping up-we will start low-dose amlodipine-continue to hold losartan due to resolving AKI.  Hypothyroidism-TSH 1.345 on 1/23- (present on admission) Continue levothyroxine.  ?  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  Given his body habitus-lumbar puncture will be quite challenging.  No longer on Sinemet.  Overall symptoms have improved, discussed with neurology team on 07/01/2021 keep on this current medication regimen and discharged to SNF once bed is available   Hypercalcemia- (present on admission) Due to dehydration-resolved.  AKI on CKD stage IIIb.- (present on admission) AKI likely hemodynamically mediated-improved with  supportive care-and close to baseline of between 1.6-1.8.  Severe  sepsis due to left lower lobe pneumonia- (present on admission) Sepsis physiology has resolved-blood cultures negative so far-highly likely that this is aspiration pneumonia-continue Unasyn x5 days.  Repeat chest x-ray on 07/01/2020.  Generalized weakness Significant functional decline over the past 1 year-at baseline using cane/walker-however due to acute illness-nonambulatory for almost a week.  On exam-has generalized weakness but is nonfocal.  Continues to have tremors.  Suspicion for neurodegenerative issue per neurology-work-up in progress-see above.  Evaluated by PT/OT-recommendations are for SNF.  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  Paroxysmal atrial fibrillation ([place])- (present on admission) Paced rhythm-but rate controlled.  On Eliquis.  Continue telemetry monitoring.   BMI: Estimated body mass index is 38.27 kg/m as calculated from the following:   Height as of this encounter: 6' (1.829 m).   Weight as of this encounter: 128 kg.    DVT Prophylaxis: Eliquis Procedures: None Consults: Neurology Code Status:DNR Family Communication: Daughter-Tammy over the [phone] on 1/27, wife bedside 06/30/21   Disposition Plan:   Status is: Inpatient - SNF once bed available.   Diet: Diet Order             DIET - DYS 1 Room service appropriate? Yes; Fluid consistency: Thin; Fluid restriction: 1500 mL Fluid  Diet effective now                   MEDICATIONS: Scheduled Meds:  amLODipine  5 mg Oral Daily   apixaban  5 mg Oral BID   feeding supplement (GLUCERNA SHAKE)  237 mL Oral TID BM   Gerhardt's butt cream   Topical Q4H   levothyroxine  137 mcg Oral QAC breakfast   LORazepam  0.5 mg Oral BID   multivitamin with minerals  1 tablet Oral Daily   pantoprazole  40 mg Oral Q1200   rosuvastatin  10 mg Oral Daily   tiaGABine  8 mg Oral BID    torsemide  20 mg Oral Daily   Continuous Infusions:  ampicillin-sulbactam (UNASYN) IV 3 g (07/02/21 0630)   PRN Meds:.acetaminophen **OR** acetaminophen, albuterol, ALPRAZolam, diphenhydrAMINE-zinc acetate   I have personally reviewed following labs and imaging studies  LABORATORY DATA:  Recent Labs  Lab 06/27/21 2157 06/28/21 0458 06/28/21 0509 06/29/21 0240 06/30/21 0119 07/01/21 0139 07/02/21 0218  WBC 11.9* 8.0  --  7.9 7.1 6.4 6.2  HGB 15.6 12.2* 11.6* 13.2 13.5 13.5 13.6  HCT 48.7 38.9* 34.0* 40.2 40.3 39.6 42.8  PLT 231 139*  --  145* 146* 141* 123*  MCV 90.4 92.2  --  89.5 87.8 87.8 90.3  MCH 28.9 28.9  --  29.4 29.4 29.9 28.7  MCHC 32.0 31.4  --  32.8 33.5 34.1 31.8  RDW 13.7 13.9  --  13.7 13.7 13.7 13.8  LYMPHSABS 0.5* 0.4*  --   --   --  0.5* 0.5*  MONOABS 1.0 0.6  --   --   --  0.7 0.6  EOSABS 0.3 0.2  --   --   --  0.5 0.6*  BASOSABS 0.0 0.0  --   --   --  0.0 0.0    Recent Labs  Lab 06/27/21 2157 06/27/21 2315 06/28/21 0458 06/28/21 0509 06/29/21 0240 06/30/21 0119 07/01/21 0139 07/02/21 0218  NA 137  --  136 135 136 137 136 137  K 5.3*  --  4.2 4.0 4.1 3.8 3.6 4.4  CL 100  --  108  --  105 102 98 100  CO2 22  --  18*  --  22 25 28 26   GLUCOSE 155*  --  100*  --  86 101* 100* 97  BUN 55*  --  51*  --  45* 44* 41* 46*  CREATININE 2.51*  --  2.01*  --  1.69* 1.74* 1.82* 1.81*  CALCIUM 10.4*  --  8.9  --  9.3 9.3 9.1 9.2  AST 35  --  32  --  63*  --  32 40  ALT 19  --  15  --  14  --  21 30  ALKPHOS 75  --  56  --  58  --  56 66  BILITOT 2.7*  --  1.9*  --  1.5*  --  1.5* 1.5*  ALBUMIN 3.8  --  2.8*  --  3.0*  --  2.7* 2.8*  MG  --   --  1.6*  --  2.0  --  1.6* 2.0  CRP  --   --   --   --   --   --  4.3* 2.8*  PROCALCITON  --   --  0.15  --   --  0.15 0.10  --   LATICACIDVEN 4.4* 2.5*  --   --   --   --   --   --   INR 1.5*  --   --   --   --   --   --   --   TSH  --  1.345  --   --   --   --   --   --   AMMONIA  --   --  36*  --   --   --    --   --   BNP  --   --   --   --   --   --  208.1* 205.7*          RADIOLOGY STUDIES/RESULTS: DG Chest Port 1 View  Result Date: 07/01/2021 CLINICAL DATA:  Shortness of breath. EXAM: PORTABLE CHEST 1 VIEW COMPARISON:  06/27/2021 and older studies. FINDINGS: Opacity at the left lung base is without change from the most recent prior study. Remainder of the lungs is clear. Cardiac silhouette normal in size. Possible left pleural effusion.  No evidence of a pneumothorax. IMPRESSION: 1. No change from the most recent prior study. 2. Persistent lung base opacity, which may reflect atelectasis or pneumonia. Associated small effusion suspected. Electronically Signed   By: Lajean Manes M.D.   On: 07/01/2021 11:38     LOS: 4 days   Signature  Lala Lund M.D on 07/02/2021 at 8:57 AM   -  To page go to [email or web address]

## 2021-07-02 NOTE — Care Management Important Message (Signed)
Important Message  Patient Details  Name: [name] MRN: [id number] Date of Birth: 1940-08-04   Medicare Important Message Given:  Yes     Orbie Pyo 07/02/2021, 2:01 PM

## 2021-07-02 NOTE — Plan of Care (Signed)
  Problem: Education: Goal: Knowledge of General Education information will improve Description Including pain rating scale, medication(s)/side effects and non-pharmacologic comfort measures Outcome: Progressing   Problem: Clinical Measurements: Goal: Ability to maintain clinical measurements within normal limits will improve Outcome: Progressing Goal: Will remain free from infection Outcome: Progressing Goal: Diagnostic test results will improve Outcome: Progressing Goal: Respiratory complications will improve Outcome: Progressing Goal: Cardiovascular complication will be avoided Outcome: Progressing   Problem: Elimination: Goal: Will not experience complications related to bowel motility Outcome: Progressing Goal: Will not experience complications related to urinary retention Outcome: Progressing   Problem: Safety: Goal: Ability to remain free from injury will improve Outcome: Progressing   Problem: Skin Integrity: Goal: Risk for impaired skin integrity will decrease Outcome: Progressing   

## 2021-07-02 NOTE — Progress Notes (Signed)
Oxygen dropping to 80s while sleeping, 2L nasal cannula applied O2 in 90s. No distress noted.

## 2021-07-03 DIAGNOSIS — G9341 Metabolic encephalopathy: Secondary | ICD-10-CM | POA: Diagnosis not present

## 2021-07-03 LAB — GLUCOSE, CAPILLARY
Glucose-Capillary: 65 mg/dL — ABNORMAL LOW (ref 70–99)
Glucose-Capillary: 104 mg/dL — ABNORMAL HIGH (ref 70–99)
Glucose-Capillary: 84 mg/dL (ref 70–99)

## 2021-07-03 LAB — HEMOGLOBIN A1C
Hgb A1c MFr Bld: 5.2 % (ref 4.8–5.6)
Mean Plasma Glucose: 102.54 mg/dL

## 2021-07-03 LAB — TSH: TSH: 1.181 u[IU]/mL (ref 0.350–4.500)

## 2021-07-03 LAB — CORTISOL: Cortisol, Plasma: 17.3 ug/dL

## 2021-07-03 MED ORDER — TIAGABINE HCL 4 MG PO TABS: 8.0000 mg | ORAL_TABLET | Freq: Two times a day (BID) | ORAL | 0 refills | Status: AC

## 2021-07-03 MED ORDER — CLONAZEPAM 0.5 MG PO TBDP: 0.5000 mg | ORAL_TABLET | Freq: Two times a day (BID) | ORAL | 0 refills | Status: AC

## 2021-07-03 MED ORDER — DIPHENHYDRAMINE-ZINC ACETATE 2-0.1 % EX CREA: TOPICAL_CREAM | Freq: Two times a day (BID) | CUTANEOUS | 0 refills | Status: AC | PRN

## 2021-07-03 MED ORDER — CLONAZEPAM 0.25 MG PO TBDP
0.5000 mg | ORAL_TABLET | Freq: Two times a day (BID) | ORAL | Status: DC
  Administered 2021-07-03 – 2021-07-04 (×3): 0.5 mg via ORAL
  Filled 2021-07-03 (×3): qty 2

## 2021-07-03 MED ORDER — MINERAL OIL RE ENEM
1.0000 | ENEMA | Freq: Once | RECTAL | Status: DC
  Filled 2021-07-03: qty 1

## 2021-07-03 MED ORDER — ALPRAZOLAM 0.5 MG PO TABS: 0.5000 mg | ORAL_TABLET | Freq: Two times a day (BID) | ORAL | 0 refills | Status: AC | PRN

## 2021-07-03 NOTE — Progress Notes (Signed)
PROGRESS NOTE        PATIENT DETAILS Name: [name] Age: 81 y.o. 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  He was subsequently admitted to the hospitalist service.  See below for further details.  Significant events: 1/25>> admit to [facility] for encephalopathy/PNA/sepsis/AKI.  Significant imaging studies: 1/25>> CXR: LLL PNA 1/26>> CT head: No acute pathology  Culture data: 1/25>> blood culture: No growth 1/25>> COVID/flu PCR: Negative  Subjective -  Patient in bed, appears comfortable, denies any headache, no fever, no chest pain or pressure, no shortness of breath , no abdominal pain. No new focal weakness.  Objective: Vitals: Blood pressure 113/67, pulse 68, temperature 97.9 F (36.6 C), temperature source Oral, resp. rate 18, height 6' (1.829 m), weight 128 kg, SpO2 99 %.   Exam:  Awake Alert, No new F.N deficits, Normal affect [place].AT,PERRAL Supple Neck, No JVD,   Symmetrical Chest wall movement, Good air movement bilaterally, CTAB RRR,No Gallops, Rubs or new Murmurs,  +ve B.Sounds, Abd Soft, No tenderness,   No Cyanosis, Clubbing or edema    Assessment/Plan: * Acute metabolic encephalopathy- (present on admission) Multifactorial etiology-due to PNA/AKI-in from multiple medication changes.   Clinically improved-more awake and alert compared to how he first presented to the ED. neurology has seen the patient his Sinemet has been stopped, benzos have been resumed, he has finished treatment for pneumonia, currently close to his baseline, discharge in the next 1 to 2 days with outpatient follow-up with neurology and psych.    Anxiety disorder- (present on admission) Longstanding history of anxiety disorder-recently tapered off benzodiazepines-and started on Gabitril.  After discussion with neurology-has been started on scheduled bronchodilators with some clinical improvement in his dystonia/mental status.  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 Appreciate SLP input-starting dysphagia 1 diet thin liquids.  Watch for signs of aspiration.  OSA (obstructive sleep apnea)- (present on admission) Noncompliant to BiPAP per family.  Hypomagnesemia Repleted.  Chronic HFrEF (heart failure with reduced ejection fraction) (EF 45-50% by echo on 03/23/2021)- (present on admission) Volume status is stable-resumed Demadex.  HTN (hypertension)- (present on admission) BP slowly creeping up-we will start low-dose amlodipine-continue to hold losartan due to resolving AKI.  Hypothyroidism-TSH 1.345 on 1/23- (present on admission) Continue levothyroxine.  ?  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  Given his body habitus-lumbar puncture will be quite challenging.  No longer on Sinemet.  Overall symptoms have improved, discussed with neurology team on 07/01/2021 keep on this current medication regimen and discharged to SNF once bed is available  Hypercalcemia- (present on admission) Due to  dehydration-resolved.  AKI on CKD stage IIIb.- (  present on admission) AKI likely hemodynamically mediated-improved with supportive care-and close to baseline of between 1.6-1.8.  Severe sepsis due to left lower lobe pneumonia- (present on admission) Sepsis physiology has resolved-blood cultures negative so far-highly likely that this is aspiration pneumonia-continue Unasyn x5 days.  Repeat chest x-ray stable.  Generalized weakness Significant functional decline over the past 1 year-at baseline using cane/walker-however due to acute illness-nonambulatory for almost a week.  On exam-has generalized weakness but is nonfocal.  Continues to have tremors.  Suspicion for neurodegenerative issue per neurology-work-up in progress-see above.  Evaluated by PT/OT-recommendations are for SNF.  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  Paroxysmal atrial fibrillation ([place])- (present on admission) Paced rhythm-but rate controlled.  On Eliquis.  Continue telemetry monitoring.  Perianal abrasion with some bleeding.  Involved wound care.   BMI: Estimated body mass index is 38.27 kg/m as calculated from the following:   Height as of this encounter: 6' (1.829 m).   Weight as of this encounter: 128 kg.    DVT Prophylaxis: Eliquis Procedures: None Consults: Neurology Code Status:DNR Family Communication: Daughter-Tammy over the [phone] on 1/27, wife bedside 06/30/21   Disposition Plan:   Status is: Inpatient - SNF once bed available.   Diet: Diet Order             DIET - DYS 1 Room service appropriate? Yes; Fluid consistency: Thin; Fluid restriction: 1500 mL Fluid  Diet effective now                   MEDICATIONS: Scheduled Meds:  amLODipine  5 mg Oral Daily   apixaban  5 mg Oral BID   clonazepam  0.5 mg Oral BID   feeding supplement (GLUCERNA SHAKE)  237 mL Oral TID BM   Gerhardt's butt cream   Topical Q4H   influenza  vaccine adjuvanted  0.5 mL Intramuscular Tomorrow-1000   levothyroxine  137 mcg Oral QAC breakfast   mineral oil  1 enema Rectal Once   multivitamin with minerals  1 tablet Oral Daily   pantoprazole  40 mg Oral Q1200   pneumococcal 23 valent vaccine  0.5 mL Intramuscular Tomorrow-1000   rosuvastatin  10 mg Oral Daily   tiaGABine  8 mg Oral BID   torsemide  20 mg Oral Daily   Continuous Infusions:  ampicillin-sulbactam (UNASYN) IV 3 g (07/03/21 0612)   PRN Meds:.acetaminophen **OR** acetaminophen, albuterol, ALPRAZolam, diphenhydrAMINE-zinc acetate   I have personally reviewed following labs and imaging studies  LABORATORY DATA:  Recent Labs  Lab 06/27/21 2157 06/28/21 0458 06/28/21 0509 06/29/21 0240 06/30/21 0119 07/01/21 0139 07/02/21 0218  WBC 11.9* 8.0  --  7.9 7.1 6.4 6.2  HGB 15.6 12.2* 11.6* 13.2 13.5 13.5 13.6  HCT 48.7 38.9* 34.0* 40.2 40.3 39.6 42.8  PLT 231 139*  --  145* 146* 141* 123*  MCV 90.4 92.2  --  89.5 87.8 87.8 90.3  MCH 28.9 28.9  --  29.4 29.4 29.9 28.7  MCHC 32.0 31.4  --  32.8 33.5 34.1 31.8  RDW 13.7 13.9  --  13.7 13.7 13.7 13.8  LYMPHSABS 0.5* 0.4*  --   --   --  0.5* 0.5*  MONOABS 1.0 0.6  --   --   --  0.7 0.6  EOSABS 0.3 0.2  --   --   --  0.5 0.6*  BASOSABS 0.0 0.0  --   --   --  0.0 0.0  Recent Labs  Lab 06/27/21 2157 06/27/21 2315 06/28/21 0458 06/28/21 0509 06/29/21 0240 06/30/21 0119 07/01/21 0139 07/02/21 0218 07/03/21 0856  NA 137  --  136 135 136 137 136 137  --   K 5.3*  --  4.2 4.0 4.1 3.8 3.6 4.4  --   CL 100  --  108  --  105 102 98 100  --   CO2 22  --  18*  --  22 25 28 26   --   GLUCOSE 155*  --  100*  --  86 101* 100* 97  --   BUN 55*  --  51*  --  45* 44* 41* 46*  --   CREATININE 2.51*  --  2.01*  --  1.69* 1.74* 1.82* 1.81*  --   CALCIUM 10.4*  --  8.9  --  9.3 9.3 9.1 9.2  --   AST 35  --  32  --  63*  --  32 40  --   ALT 19  --  15  --  14  --  21 30  --   ALKPHOS 75  --  56  --  58  --  56 66  --    BILITOT 2.7*  --  1.9*  --  1.5*  --  1.5* 1.5*  --   ALBUMIN 3.8  --  2.8*  --  3.0*  --  2.7* 2.8*  --   MG  --   --  1.6*  --  2.0  --  1.6* 2.0  --   CRP  --   --   --   --   --   --  4.3* 2.8*  --   PROCALCITON  --   --  0.15  --   --  0.15 0.10  --   --   LATICACIDVEN 4.4* 2.5*  --   --   --   --   --   --   --   INR 1.5*  --   --   --   --   --   --   --   --   TSH  --  1.345  --   --   --   --   --   --   --   HGBA1C  --   --   --   --   --   --   --   --  5.2  AMMONIA  --   --  36*  --   --   --   --   --   --   BNP  --   --   --   --   --   --  208.1* 205.7*  --     RADIOLOGY STUDIES/RESULTS: No results found.   LOS: 5 days   Signature  Lala Lund M.D on 07/03/2021 at 9:45 AM   -  To page go to [email or web address]

## 2021-07-03 NOTE — Plan of Care (Signed)
°  Problem: Acute Rehab OT Goals (only OT should resolve) Goal: Pt. Will Perform Grooming Outcome: Adequate for Discharge Goal: Pt. Will Perform Upper Body Bathing Outcome: Adequate for Discharge Goal: Pt. Will Transfer To Toilet Outcome: Adequate for Discharge Goal: Pt/Caregiver Will Perform Home Exercise Program Outcome: Adequate for Discharge Goal: OT Additional ADL Goal #1 Outcome: Adequate for Discharge   Problem: Acute Rehab PT Goals(only PT should resolve) Goal: Pt Will Go Supine/Side To Sit Outcome: Adequate for Discharge Goal: Pt Will Go Sit To Supine/Side Outcome: Adequate for Discharge Goal: Patient Will Transfer Sit To/From Stand Outcome: Adequate for Discharge Goal: Pt Will Transfer Bed To Chair/Chair To Bed Outcome: Adequate for Discharge   Problem: Malnutrition  (NI-5.2) Goal: Food and/or nutrient delivery Description: Individualized approach for food/nutrient provision. Outcome: Adequate for Discharge   Problem: Clinical Measurements: Goal: Diagnostic test results will improve Outcome: Adequate for Discharge Goal: Respiratory complications will improve Outcome: Adequate for Discharge Goal: Cardiovascular complication will be avoided Outcome: Adequate for Discharge   Problem: Activity: Goal: Risk for activity intolerance will decrease Outcome: Adequate for Discharge   Problem: Nutrition: Goal: Adequate nutrition will be maintained Outcome: Adequate for Discharge   Problem: Coping: Goal: Level of anxiety will decrease Outcome: Adequate for Discharge   Problem: Elimination: Goal: Will not experience complications related to bowel motility Outcome: Adequate for Discharge Goal: Will not experience complications related to urinary retention Outcome: Adequate for Discharge   Problem: Pain Managment: Goal: General experience of comfort will improve Outcome: Adequate for Discharge   Problem: Skin Integrity: Goal: Risk for impaired skin integrity will  decrease Outcome: Adequate for Discharge

## 2021-07-03 NOTE — Progress Notes (Signed)
Nutrition Follow-up  DOCUMENTATION CODES:   Non-severe (moderate) malnutrition in context of chronic illness, Obesity unspecified  INTERVENTION:  -continue Glucerna Shake po TID, each supplement provides 220 kcal and 10 grams of protein -continue MVI with minerals daily  NUTRITION DIAGNOSIS:   Moderate Malnutrition related to chronic illness (Parkinson's disease) as evidenced by mild fat depletion, mild muscle depletion.  ongoing  GOAL:   Patient will meet greater than or equal to 90% of their needs  progressing  MONITOR:   PO intake, Supplement acceptance, Weight trends  REASON FOR ASSESSMENT:   Malnutrition Screening Tool    ASSESSMENT:   Pt is a 81 y.o. male with PMH significant for recently diagnosed Parkinson's disease, HTN, T2DM, HLD, GERD, stage III CKD, acquired hypothyroidism, who presented to the ED for altered mental status and was admitted to [facility] concerning for acute metabolic encephalopathy and severe sepsis due to left lower lobe pneumonia.  Pt pending discharge to SNF which MD states will likely occur within the next 1-2 days as pt is nearly baseline. Pt reports appetite has improved and endorses good supplements consumption. RN also states pt tolerating supplements well (Glucerna TID). Recommend continue current nutrition plan of care.   PO Intake: 50-100% x 5 recorded meals (79% avg meal intake)   UOP: 1450ml x24 hours I/O: -4061ml since admit  Medications: mvi with minerals, protonix Labs: Recent Labs  Lab 06/29/21 0240 06/30/21 0119 07/01/21 0139 07/02/21 0218  NA 136 137 136 137  K 4.1 3.8 3.6 4.4  CL 105 102 98 100  CO2 22 25 28 26   BUN 45* 44* 41* 46*  CREATININE 1.69* 1.74* 1.82* 1.81*  CALCIUM 9.3 9.3 9.1 9.2  MG 2.0  --  1.6* 2.0  GLUCOSE 86 101* 100* 97  CBGs: 65-104 x24 hours   Diet Order:   Diet Order             DIET - DYS 1 Room service appropriate? Yes; Fluid consistency: Thin; Fluid restriction: 1500 mL Fluid  Diet  effective now                   EDUCATION NEEDS:   Education needs have been addressed  Skin:  Skin Assessment: Reviewed RN Assessment  Last BM:  PTA  Height:   Ht Readings from Last 1 Encounters:  06/29/21 6' (1.829 m)    Weight:   Wt Readings from Last 1 Encounters:  07/01/21 128 kg    Ideal Body Weight:  83.6 kg  BMI:  Body mass index is 38.27 kg/m.  Estimated Nutritional Needs:   Kcal:  2200 - 2400  Protein:  110 - 125 grams  Fluid:  > 2 L     Blanch Stang A., MS, RD, LDN (she/her/hers) RD pager number and weekend/on-call pager number located in [place].

## 2021-07-03 NOTE — TOC Progression Note (Signed)
Transition of Care [facility]) - Progression Note    Patient Details  Name: [name] MRN: [id number] Date of Birth: 1940-09-30  Transition of Care [facility]) CM/SW [place], [place] Phone Number: 07/03/2021, 1:05 PM  Clinical Narrative:   Patient insurance approved SNF placement and bed available at [facility]. Per MD, not ready today. CSW to follow.    Expected Discharge Plan: [place] Barriers to Discharge: Continued Medical Work up  Expected Discharge Plan and Services Expected Discharge Plan: [place] Choice: [place] arrangements for the past 2 months: Single Family Home                                       Social Determinants of Health (SDOH) Interventions    Readmission Risk Interventions No flowsheet data found.

## 2021-07-03 NOTE — Plan of Care (Signed)
Problem: Education: Goal: Knowledge of General Education information will improve Description: Including pain rating scale, medication(s)/side effects and non-pharmacologic comfort measures Outcome: Completed/Met   Problem: Health Behavior/Discharge Planning: Goal: Ability to manage health-related needs will improve Outcome: Completed/Met   Problem: Clinical Measurements: Goal: Ability to maintain clinical measurements within normal limits will improve Outcome: Completed/Met Goal: Will remain free from infection Outcome: Completed/Met Goal: Diagnostic test results will improve Outcome: Progressing Goal: Respiratory complications will improve Outcome: Progressing Goal: Cardiovascular complication will be avoided Outcome: Progressing   Problem: Activity: Goal: Risk for activity intolerance will decrease Outcome: Progressing   Problem: Nutrition: Goal: Adequate nutrition will be maintained Outcome: Progressing   Problem: Coping: Goal: Level of anxiety will decrease Outcome: Progressing   Problem: Elimination: Goal: Will not experience complications related to bowel motility Outcome: Progressing Goal: Will not experience complications related to urinary retention Outcome: Progressing   Problem: Elimination: Goal: Will not experience complications related to bowel motility Outcome: Progressing Goal: Will not experience complications related to urinary retention Outcome: Progressing   Problem: Pain Managment: Goal: General experience of comfort will improve Outcome: Progressing   Problem: Safety: Goal: Ability to remain free from injury will improve Outcome: Completed/Met   Problem: Skin Integrity: Goal: Risk for impaired skin integrity will decrease Outcome: Progressing

## 2021-07-03 NOTE — Consult Note (Addendum)
WOC consult requested for "rectal bleeding."  This was already performed on 1/27; refer to previous consult note by Maudie Flakes and topical treatment orders have been provided for bedside nurses to perform. Please re-consult if further assistance is needed.  Thank-you,  Julien Girt MSN, [place], [place], [place], [place]

## 2021-07-04 DIAGNOSIS — I110 Hypertensive heart disease with heart failure: Secondary | ICD-10-CM | POA: Diagnosis not present

## 2021-07-04 DIAGNOSIS — M6281 Muscle weakness (generalized): Secondary | ICD-10-CM | POA: Diagnosis not present

## 2021-07-04 DIAGNOSIS — I5022 Chronic systolic (congestive) heart failure: Secondary | ICD-10-CM | POA: Diagnosis not present

## 2021-07-04 DIAGNOSIS — K922 Gastrointestinal hemorrhage, unspecified: Secondary | ICD-10-CM | POA: Diagnosis not present

## 2021-07-04 DIAGNOSIS — I10 Essential (primary) hypertension: Secondary | ICD-10-CM | POA: Diagnosis not present

## 2021-07-04 DIAGNOSIS — G4733 Obstructive sleep apnea (adult) (pediatric): Secondary | ICD-10-CM | POA: Diagnosis not present

## 2021-07-04 DIAGNOSIS — R110 Nausea: Secondary | ICD-10-CM | POA: Diagnosis not present

## 2021-07-04 DIAGNOSIS — N261 Atrophy of kidney (terminal): Secondary | ICD-10-CM | POA: Diagnosis not present

## 2021-07-04 DIAGNOSIS — M25522 Pain in left elbow: Secondary | ICD-10-CM | POA: Diagnosis not present

## 2021-07-04 DIAGNOSIS — G20 Parkinson's disease: Secondary | ICD-10-CM | POA: Diagnosis not present

## 2021-07-04 DIAGNOSIS — R0989 Other specified symptoms and signs involving the circulatory and respiratory systems: Secondary | ICD-10-CM | POA: Diagnosis not present

## 2021-07-04 DIAGNOSIS — A419 Sepsis, unspecified organism: Secondary | ICD-10-CM | POA: Diagnosis not present

## 2021-07-04 DIAGNOSIS — Z515 Encounter for palliative care: Secondary | ICD-10-CM | POA: Diagnosis not present

## 2021-07-04 DIAGNOSIS — K5900 Constipation, unspecified: Secondary | ICD-10-CM | POA: Diagnosis not present

## 2021-07-04 DIAGNOSIS — N281 Cyst of kidney, acquired: Secondary | ICD-10-CM | POA: Diagnosis not present

## 2021-07-04 DIAGNOSIS — F39 Unspecified mood [affective] disorder: Secondary | ICD-10-CM | POA: Diagnosis not present

## 2021-07-04 DIAGNOSIS — J189 Pneumonia, unspecified organism: Secondary | ICD-10-CM | POA: Diagnosis not present

## 2021-07-04 DIAGNOSIS — R2689 Other abnormalities of gait and mobility: Secondary | ICD-10-CM | POA: Diagnosis not present

## 2021-07-04 DIAGNOSIS — K920 Hematemesis: Secondary | ICD-10-CM | POA: Diagnosis not present

## 2021-07-04 DIAGNOSIS — N179 Acute kidney failure, unspecified: Secondary | ICD-10-CM | POA: Diagnosis not present

## 2021-07-04 DIAGNOSIS — L89313 Pressure ulcer of right buttock, stage 3: Secondary | ICD-10-CM | POA: Diagnosis not present

## 2021-07-04 DIAGNOSIS — I5020 Unspecified systolic (congestive) heart failure: Secondary | ICD-10-CM | POA: Diagnosis not present

## 2021-07-04 DIAGNOSIS — I480 Paroxysmal atrial fibrillation: Secondary | ICD-10-CM | POA: Diagnosis not present

## 2021-07-04 DIAGNOSIS — G9341 Metabolic encephalopathy: Secondary | ICD-10-CM | POA: Diagnosis not present

## 2021-07-04 DIAGNOSIS — R0602 Shortness of breath: Secondary | ICD-10-CM | POA: Diagnosis not present

## 2021-07-04 DIAGNOSIS — R1111 Vomiting without nausea: Secondary | ICD-10-CM | POA: Diagnosis not present

## 2021-07-04 DIAGNOSIS — M25511 Pain in right shoulder: Secondary | ICD-10-CM | POA: Diagnosis not present

## 2021-07-04 DIAGNOSIS — F410 Panic disorder [episodic paroxysmal anxiety] without agoraphobia: Secondary | ICD-10-CM | POA: Diagnosis not present

## 2021-07-04 DIAGNOSIS — F329 Major depressive disorder, single episode, unspecified: Secondary | ICD-10-CM | POA: Diagnosis not present

## 2021-07-04 DIAGNOSIS — Z79899 Other long term (current) drug therapy: Secondary | ICD-10-CM | POA: Diagnosis not present

## 2021-07-04 DIAGNOSIS — F419 Anxiety disorder, unspecified: Secondary | ICD-10-CM | POA: Diagnosis not present

## 2021-07-04 DIAGNOSIS — I5032 Chronic diastolic (congestive) heart failure: Secondary | ICD-10-CM | POA: Diagnosis not present

## 2021-07-04 DIAGNOSIS — Z7401 Bed confinement status: Secondary | ICD-10-CM | POA: Diagnosis not present

## 2021-07-04 DIAGNOSIS — R109 Unspecified abdominal pain: Secondary | ICD-10-CM | POA: Diagnosis present

## 2021-07-04 DIAGNOSIS — I4891 Unspecified atrial fibrillation: Secondary | ICD-10-CM | POA: Diagnosis not present

## 2021-07-04 DIAGNOSIS — G9340 Encephalopathy, unspecified: Secondary | ICD-10-CM | POA: Diagnosis not present

## 2021-07-04 DIAGNOSIS — L8990 Pressure ulcer of unspecified site, unspecified stage: Secondary | ICD-10-CM | POA: Diagnosis not present

## 2021-07-04 DIAGNOSIS — E785 Hyperlipidemia, unspecified: Secondary | ICD-10-CM | POA: Diagnosis not present

## 2021-07-04 DIAGNOSIS — R531 Weakness: Secondary | ICD-10-CM | POA: Diagnosis not present

## 2021-07-04 DIAGNOSIS — R1084 Generalized abdominal pain: Secondary | ICD-10-CM | POA: Diagnosis not present

## 2021-07-04 DIAGNOSIS — E039 Hypothyroidism, unspecified: Secondary | ICD-10-CM | POA: Diagnosis not present

## 2021-07-04 DIAGNOSIS — J690 Pneumonitis due to inhalation of food and vomit: Secondary | ICD-10-CM | POA: Diagnosis not present

## 2021-07-04 DIAGNOSIS — L89159 Pressure ulcer of sacral region, unspecified stage: Secondary | ICD-10-CM | POA: Diagnosis not present

## 2021-07-04 DIAGNOSIS — N1832 Chronic kidney disease, stage 3b: Secondary | ICD-10-CM | POA: Diagnosis not present

## 2021-07-04 DIAGNOSIS — K220 Achalasia of cardia: Secondary | ICD-10-CM | POA: Diagnosis not present

## 2021-07-04 DIAGNOSIS — K8020 Calculus of gallbladder without cholecystitis without obstruction: Secondary | ICD-10-CM | POA: Diagnosis not present

## 2021-07-04 DIAGNOSIS — Z20822 Contact with and (suspected) exposure to covid-19: Secondary | ICD-10-CM | POA: Diagnosis not present

## 2021-07-04 DIAGNOSIS — M1990 Unspecified osteoarthritis, unspecified site: Secondary | ICD-10-CM | POA: Diagnosis not present

## 2021-07-04 DIAGNOSIS — W19XXXA Unspecified fall, initial encounter: Secondary | ICD-10-CM | POA: Diagnosis not present

## 2021-07-04 DIAGNOSIS — L89153 Pressure ulcer of sacral region, stage 3: Secondary | ICD-10-CM | POA: Diagnosis not present

## 2021-07-04 DIAGNOSIS — R21 Rash and other nonspecific skin eruption: Secondary | ICD-10-CM | POA: Diagnosis not present

## 2021-07-04 DIAGNOSIS — K219 Gastro-esophageal reflux disease without esophagitis: Secondary | ICD-10-CM | POA: Diagnosis not present

## 2021-07-04 DIAGNOSIS — R634 Abnormal weight loss: Secondary | ICD-10-CM | POA: Diagnosis not present

## 2021-07-04 DIAGNOSIS — R1310 Dysphagia, unspecified: Secondary | ICD-10-CM | POA: Diagnosis not present

## 2021-07-04 LAB — CBC
HCT: 39.9 % (ref 39.0–52.0)
Hemoglobin: 13.3 g/dL (ref 13.0–17.0)
MCH: 29.8 pg (ref 26.0–34.0)
MCHC: 33.3 g/dL (ref 30.0–36.0)
MCV: 89.5 fL (ref 80.0–100.0)
Platelets: 123 10*3/uL — ABNORMAL LOW (ref 150–400)
RBC: 4.46 MIL/uL (ref 4.22–5.81)
RDW: 13.8 % (ref 11.5–15.5)
WBC: 5.9 10*3/uL (ref 4.0–10.5)
nRBC: 0 % (ref 0.0–0.2)

## 2021-07-04 LAB — BASIC METABOLIC PANEL
Anion gap: 9 (ref 5–15)
BUN: 38 mg/dL — ABNORMAL HIGH (ref 8–23)
CO2: 32 mmol/L (ref 22–32)
Calcium: 9.4 mg/dL (ref 8.9–10.3)
Chloride: 96 mmol/L — ABNORMAL LOW (ref 98–111)
Creatinine, Ser: 1.73 mg/dL — ABNORMAL HIGH (ref 0.61–1.24)
GFR, Estimated: 40 mL/min — ABNORMAL LOW (ref >60)
Glucose, Bld: 102 mg/dL — ABNORMAL HIGH (ref 70–99)
Potassium: 3.7 mmol/L (ref 3.5–5.1)
Sodium: 137 mmol/L (ref 135–145)

## 2021-07-04 LAB — MAGNESIUM: Magnesium: 1.7 mg/dL (ref 1.7–2.4)

## 2021-07-04 MED ORDER — MELATONIN 3 MG PO TABS
3.0000 mg | ORAL_TABLET | Freq: Every day | ORAL | Status: DC
  Administered 2021-07-04: 3 mg via ORAL
  Filled 2021-07-04: qty 1

## 2021-07-04 MED ORDER — AMLODIPINE BESYLATE 5 MG PO TABS: 5.0000 mg | ORAL_TABLET | Freq: Every day | ORAL | Status: AC

## 2021-07-04 NOTE — TOC Transition Note (Signed)
Transition of Care [facility]) - CM/SW Discharge Note   Patient Details  Name: [name] MRN: [id number] Date of Birth: 1941-03-23  [facility] of Care [facility]) CM/SW Contact:  Geralynn Ochs, LCSW Phone Number: 07/04/2021, 10:10 AM   Clinical Narrative:   CSW sent discharge to [facility], confirmed bed for today. Spoke with wife, she is agreeable.   Nurse to call report to [phone], Room 3222. Transport scheduled for 1:30 PM.    Final next level of care: [place] Barriers to Discharge: Barriers Resolved   Patient Goals and CMS Choice Patient states their goals for this hospitalization and ongoing recovery are:: patient unable to participate in goal setting, not fully oriented CMS Medicare.gov Compare Post Acute Care list provided to:: Patient Represenative (must comment) Choice offered to / list presented to : Spouse, Adult Children  Discharge Placement              Patient chooses bed at: [facility] Patient to be transferred to facility by: [place] Name of family member notified: Hoyle Sauer Patient and family notified of of transfer: 07/04/21  Discharge Plan and Services     Post Acute Care Choice: [place]                               Social Determinants of Health (SDOH) Interventions     Readmission Risk Interventions No flowsheet data found.

## 2021-07-04 NOTE — Progress Notes (Signed)
Pt discharging to SNF, [facility]. Copy of d/c instructions given to wife.  Copy of instructions printed and sent with transporters for facility.  Pt d/c'd with belongings,(family took some of belongings with them) aand transported by [place].

## 2021-07-04 NOTE — Discharge Summary (Signed)
[name] WCH:[id number] DOB: July 27, 1940 DOA: 06/27/2021  PCP: [name], MD  Admit date: 06/27/2021  Discharge date: 07/04/2021  Admitted From: Home   Disposition:  SNF   Recommendations for Outpatient Follow-up:   Follow up with PCP in 1-2 weeks  PCP Please obtain BMP/CBC, 2 view CXR in 1week,  (see Discharge instructions)   PCP Please follow up on the following pending results:    Home Health: None   Equipment/Devices: None  Consultations: Neuro Discharge Condition: Stable    CODE STATUS: DNR   Diet Recommendation: Dysphagia 1 diet with full feeding assistance and aspiration precautions all meals sitting up in chair  Diet Order             DIET - DYS 1 Room service appropriate? 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  He was subsequently admitted to the hospitalist service.  See below for further details.   Significant events: 1/25>> admit to [facility] for encephalopathy/PNA/sepsis/AKI.   Significant imaging studies: 1/25>>  CXR: LLL PNA 1/26>> CT head: No acute pathology   Culture data: 1/25>> blood culture: No growth 1/25>> COVID/flu PCR: Negative                                                                 Hospital Course   Acute metabolic encephalopathy- (present on admission) Multifactorial etiology-due to PNA/AKI-in from multiple medication changes.  Clinically improved-more awake and alert compared to how he first presented to the ED. neurology has seen the patient his Sinemet has been stopped, benzos have been resumed, he has finished treatment for pneumonia, currently close to his baseline, discharge to SNF today.  Please have him follow-up with [facility] neurology and his primary psychiatry in 1 to 2 weeks post discharge.     Anxiety disorder- (present on admission) Longstanding history of anxiety disorder-recently tapered off benzodiazepines-and started on Gabitril.  After discussion with neurology-has been started on scheduled benzodiazepines with clinical improvement in his dystonia/mental status.   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 Appreciate SLP input-starting dysphagia  1 diet thin liquids.  Watch for signs of aspiration.  Continue speech therapy follow-up at SNF   OSA (obstructive sleep apnea)- (present on admission) Noncompliant to BiPAP per family.  Encouraged to use at night.   Hypomagnesemia Repleted.   Chronic HFrEF (heart failure with reduced ejection fraction) (EF 45-50% by echo on 03/23/2021)- (present on admission) Volume status is stable-resumed Demadex.   HTN (hypertension)- (present on admission) BP slowly creeping up-we will start low-dose amlodipine-continue to hold losartan due to resolving AKI.   Hypothyroidism-TSH 1.345 on 1/23- (present on admission) Continue levothyroxine.   ?  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  Given his body habitus-lumbar puncture will be quite challenging.  No longer on Sinemet.  Overall symptoms have improved, discussed with neurology team on 07/01/2021 keep on this current medication regimen and discharged to SNF once bed is available, needs outpatient follow-up with his primary neurologist in 1 to 2 weeks post discharge.  His Sinemet has been stopped by neurology here.  Dystonia much improved after benzodiazepines and a single dose of Haldol.   Hypercalcemia- (present on admission) Due to dehydration-resolved.   AKI on CKD stage IIIb.- (present on admission) AKI likely hemodynamically mediated-improved with supportive care-and close to baseline of between 1.6-1.8.   Severe sepsis due to left lower lobe pneumonia- (present on admission) Sepsis physiology has resolved-blood cultures negative so far-highly likely that this is aspiration pneumonia-has finished Unasyn x5 days.  Repeat chest x-ray stable.   Generalized weakness Stable no acute issues.  Evaluated by PT/OT-recommendations are for SNF.   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   Paroxysmal atrial fibrillation ([place])- (present on admission) Paced rhythm-but rate controlled.  On Eliquis.  Continue telemetry monitoring.   Perianal abrasion with some bleeding.  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   Hypercalcemia   ?  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   Anxiety disorder    Discharge instructions    Discharge Instructions     Discharge instructions   Complete by: As directed    Follow with Primary MD [name], MD in 7 days   Get CBC, CMP, 2 view Chest X ray -  checked next visit within 1 week by SNF MD   Activity: As tolerated with Full fall precautions use walker/cane & assistance as needed  Disposition SNF  Diet: Dysphagia 1 diet with feeding assistance and aspiration precautions.  Must eat sitting up in chair.   Special Instructions: If you have smoked or chewed Tobacco  in the last  2 yrs please stop smoking, stop any regular Alcohol  and or any Recreational drug use.  On your next visit with your primary care physician please Get Medicines reviewed and adjusted.  Please request your Prim.MD to go over all Hospital Tests and Procedure/Radiological results at the follow up, please get all Hospital records sent to your Prim MD by signing hospital release before you go home.  If you experience worsening of your admission symptoms, develop shortness of breath, life threatening emergency, suicidal or homicidal thoughts you must seek medical attention immediately by calling 911 or calling your MD immediately  if symptoms less severe.  You Must read complete instructions/literature along with all the possible adverse reactions/side effects for all the Medicines you take and that have been prescribed to you. Take any new Medicines after you have completely understood and accpet all the possible adverse reactions/side effects.   Discharge wound care:   Complete by: As directed    Wound care to right upper extremity medial  aspect areas of skin loss: cleanse with NS, pat dry. Cover with Xeroform gauze, top with dry gauze and cover with ABD pad.  Secure with Kerlix roll gauxe/paper tape.  Keep perianal area clean and dry at all times   Increase activity slowly   Complete by: As directed        Discharge Medications   Allergies as of 07/04/2021       Reactions   Hydrochlorothiazide Other (See Comments)   "gout flares"   Lexapro [escitalopram Oxalate] Other (See Comments)   Ineffective for anxiety   Lexapro [escitalopram] Other (See Comments)   Ineffective for anxiety (10/2011)   Lisinopril Swelling, Other (See Comments)   Angioedema   Silodosin Diarrhea   Tamsulosin Other (See Comments)   Visual changes        Medication List     STOP taking these medications    carbidopa-levodopa 25-100 MG tablet Commonly known as: SINEMET IR   losartan 50 MG tablet Commonly known as: COZAAR   PARoxetine 40 MG tablet Commonly known as: PAXIL   traZODone 50 MG tablet Commonly known as: DESYREL       TAKE these medications    Accu-Chek SmartView test strip Generic drug: glucose blood   acetaminophen 500 MG tablet Commonly known as: TYLENOL Take 1,000 mg by mouth every 6 (six) hours as needed for headache or mild pain.   ALPRAZolam 0.5 MG tablet Commonly known as: XANAX Take 1 tablet (0.5 mg total) by mouth 2 (two) times daily as needed for anxiety.   amLODipine 5 MG tablet Commonly known as: NORVASC Take 1 tablet (5 mg total) by mouth daily.   apixaban 5 MG Tabs tablet Commonly known as: ELIQUIS Take 1 tablet (5 mg total) by mouth 2 (two) times daily.   clonazePAM 0.5 MG disintegrating tablet Commonly known as: KLONOPIN Take 1 tablet (0.5 mg total) by mouth 2 (two) times daily.   diphenhydrAMINE-zinc acetate cream Commonly known as: BENADRYL Apply topically 2 (two) times daily as needed for itching.   levothyroxine 137 MCG tablet Commonly known as: SYNTHROID Take 137 mcg by mouth  daily before breakfast.   melatonin 5 MG Tabs Take 5-10 mg by mouth at bedtime.   pantoprazole 40 MG tablet Commonly known as: PROTONIX Take 40 mg by mouth daily before breakfast.   Restasis 0.05 % ophthalmic emulsion Generic drug: cycloSPORINE Place 1 drop into both eyes 2 (two) times daily as needed ("for dryness").   rosuvastatin 10 MG  tablet Commonly known as: CRESTOR Take 1 tablet (10 mg total) by mouth daily.   tiaGABine 4 MG tablet Commonly known as: GABITRIL Take 2 tablets (8 mg total) by mouth 2 (two) times daily. What changed:  how much to take when to take this additional instructions   torsemide 20 MG tablet Commonly known as: DEMADEX Take 1 tablet (20 mg total) by mouth as needed (swelling). What changed:  when to take this reasons to take this               Discharge Care Instructions  (From admission, onward)           Start     Ordered   07/04/21 0000  Discharge wound care:       Comments: Wound care to right upper extremity medial aspect areas of skin loss: cleanse with NS, pat dry. Cover with Xeroform gauze, top with dry gauze and cover with ABD pad.  Secure with Kerlix roll gauxe/paper tape.  Keep perianal area clean and dry at all times   07/04/21 0841             Contact information for after-discharge care     [facility] .   Service: Skilled Nursing Contact information: [place] [phone]                     Major procedures and Radiology Reports - PLEASE review detailed and final reports thoroughly  -      CT Head Wo Contrast  Result Date: 06/28/2021 CLINICAL DATA:  Altered mental status. EXAM: CT HEAD WITHOUT CONTRAST TECHNIQUE: Contiguous axial images were obtained from the base of the skull through the vertex without intravenous contrast. RADIATION DOSE REDUCTION: This exam was performed according to the  departmental dose-optimization program which includes automated exposure control, adjustment of the mA and/or kV according to patient size and/or use of iterative reconstruction technique. COMPARISON:  None. FINDINGS: Brain: Moderate age-related atrophy and chronic microvascular ischemic changes. There is no acute intracranial hemorrhage. No mass effect or midline shift. No extra-axial fluid collection. Vascular: No hyperdense vessel or unexpected calcification. Skull: Normal. Negative for fracture or focal lesion. Sinuses/Orbits: No acute finding. Other: None IMPRESSION: 1. No acute intracranial pathology. 2. Moderate age-related atrophy and chronic microvascular ischemic changes. Electronically Signed   By: Anner Crete M.D.   On: 06/28/2021 03:02   DG Chest Port 1 View  Result Date: 07/01/2021 CLINICAL DATA:  Shortness of breath. EXAM: PORTABLE CHEST 1 VIEW COMPARISON:  06/27/2021 and older studies. FINDINGS: Opacity at the left lung base is without change from the most recent prior study. Remainder of the lungs is clear. Cardiac silhouette normal in size. Possible left pleural effusion.  No evidence of a pneumothorax. IMPRESSION: 1. No change from the most recent prior study. 2. Persistent lung base opacity, which may reflect atelectasis or pneumonia. Associated small effusion suspected. Electronically Signed   By: Lajean Manes M.D.   On: 07/01/2021 11:38   DG Chest Port 1 View  Result Date: 06/27/2021 CLINICAL DATA:  Sepsis workup. EXAM: PORTABLE CHEST 1 VIEW COMPARISON:  PA and lateral chest 04/15/2021 FINDINGS: The heart enlarged but stable. Central vessels are normal caliber. Left chest dual lead pacing system and wire insertions are unchanged. The aorta is tortuous with ectasia and patchy calcification. Stable mediastinum. There is increased opacity in the left lower lung field concerning for pneumonia  or aspiration. Small left pleural effusion may also be forming. The remaining lungs are  clear. Mild thoracic spondylosis. Osteopenia. IMPRESSION: Increased opacity in the left lower lobe concerning for aspiration or pneumonia. Question small left pleural effusion. Aortic atherosclerosis. Cardiomegaly. Electronically Signed   By: Telford Nab M.D.   On: 06/27/2021 22:50      Today   Subjective    Demaris Leavell today has no headache,no chest abdominal pain,no new weakness tingling or numbness, feels much better    Objective   Blood pressure 125/72, pulse 72, temperature 97.7 F (36.5 C), temperature source Oral, resp. rate (!) 23, height 6' (1.829 m), weight 129.5 kg, SpO2 100 %.   Intake/Output Summary (Last 24 hours) at 07/04/2021 0842 Last data filed at 07/04/2021 0400 Gross per 24 hour  Intake 360 ml  Output 2025 ml  Net -1665 ml    Exam  Awake Alert, No new F.N deficits,    [place].AT,PERRAL Supple Neck,   Symmetrical Chest wall movement, Good air movement bilaterally, CTAB RRR,No Gallops,   +ve B.Sounds, Abd Soft, Non tender,  No Cyanosis, left upper extremity and knee rash much improved, minimal tremors   Data Review   CBC w Diff:  Lab Results  Component Value Date   WBC 5.9 07/04/2021   HGB 13.3 07/04/2021   HGB 13.4 11/19/2017   HCT 39.9 07/04/2021   HCT 40.6 11/19/2017   PLT 123 (L) 07/04/2021   PLT 197 11/19/2017   LYMPHOPCT 8 07/02/2021   MONOPCT 10 07/02/2021   EOSPCT 9 07/02/2021   BASOPCT 1 07/02/2021    CMP:  Lab Results  Component Value Date   NA 137 07/04/2021   NA 134 05/25/2021   K 3.7 07/04/2021   CL 96 (L) 07/04/2021   CO2 32 07/04/2021   BUN 38 (H) 07/04/2021   BUN 17 05/25/2021   CREATININE 1.73 (H) 07/04/2021   PROT 5.4 (L) 07/02/2021   PROT 6.6 05/25/2021   ALBUMIN 2.8 (L) 07/02/2021   ALBUMIN 4.3 05/25/2021   BILITOT 1.5 (H) 07/02/2021   BILITOT 1.3 (H) 05/25/2021   ALKPHOS 66 07/02/2021   AST 40 07/02/2021   ALT 30 07/02/2021  .   Total Time in preparing paper work, data evaluation and todays exam - 23  minutes  Lala Lund M.D on 07/04/2021 at 8:42 AM  [facility]

## 2021-07-04 NOTE — Progress Notes (Signed)
Attempted to call report to [facility] earlier with no success of reaching staff member.

## 2021-07-05 DIAGNOSIS — J189 Pneumonia, unspecified organism: Secondary | ICD-10-CM | POA: Diagnosis not present

## 2021-07-05 DIAGNOSIS — I4891 Unspecified atrial fibrillation: Secondary | ICD-10-CM | POA: Diagnosis not present

## 2021-07-05 DIAGNOSIS — E039 Hypothyroidism, unspecified: Secondary | ICD-10-CM | POA: Diagnosis not present

## 2021-07-05 DIAGNOSIS — N1832 Chronic kidney disease, stage 3b: Secondary | ICD-10-CM | POA: Diagnosis not present

## 2021-07-05 DIAGNOSIS — R21 Rash and other nonspecific skin eruption: Secondary | ICD-10-CM | POA: Diagnosis not present

## 2021-07-05 DIAGNOSIS — M6281 Muscle weakness (generalized): Secondary | ICD-10-CM | POA: Diagnosis not present

## 2021-07-05 DIAGNOSIS — K219 Gastro-esophageal reflux disease without esophagitis: Secondary | ICD-10-CM | POA: Diagnosis not present

## 2021-07-05 DIAGNOSIS — I10 Essential (primary) hypertension: Secondary | ICD-10-CM | POA: Diagnosis not present

## 2021-07-05 DIAGNOSIS — I5020 Unspecified systolic (congestive) heart failure: Secondary | ICD-10-CM | POA: Diagnosis not present

## 2021-07-06 DIAGNOSIS — N1832 Chronic kidney disease, stage 3b: Secondary | ICD-10-CM | POA: Diagnosis not present

## 2021-07-06 DIAGNOSIS — E785 Hyperlipidemia, unspecified: Secondary | ICD-10-CM | POA: Diagnosis not present

## 2021-07-06 DIAGNOSIS — R21 Rash and other nonspecific skin eruption: Secondary | ICD-10-CM | POA: Diagnosis not present

## 2021-07-06 DIAGNOSIS — R1310 Dysphagia, unspecified: Secondary | ICD-10-CM | POA: Diagnosis not present

## 2021-07-06 DIAGNOSIS — J690 Pneumonitis due to inhalation of food and vomit: Secondary | ICD-10-CM | POA: Diagnosis not present

## 2021-07-06 DIAGNOSIS — A419 Sepsis, unspecified organism: Secondary | ICD-10-CM | POA: Diagnosis not present

## 2021-07-06 DIAGNOSIS — I5020 Unspecified systolic (congestive) heart failure: Secondary | ICD-10-CM | POA: Diagnosis not present

## 2021-07-06 DIAGNOSIS — E039 Hypothyroidism, unspecified: Secondary | ICD-10-CM | POA: Diagnosis not present

## 2021-07-06 DIAGNOSIS — I10 Essential (primary) hypertension: Secondary | ICD-10-CM | POA: Diagnosis not present

## 2021-07-06 DIAGNOSIS — G9340 Encephalopathy, unspecified: Secondary | ICD-10-CM | POA: Diagnosis not present

## 2021-07-06 DIAGNOSIS — I4891 Unspecified atrial fibrillation: Secondary | ICD-10-CM | POA: Diagnosis not present

## 2021-07-06 DIAGNOSIS — F419 Anxiety disorder, unspecified: Secondary | ICD-10-CM | POA: Diagnosis not present

## 2021-07-10 DIAGNOSIS — I4891 Unspecified atrial fibrillation: Secondary | ICD-10-CM | POA: Diagnosis not present

## 2021-07-10 DIAGNOSIS — R21 Rash and other nonspecific skin eruption: Secondary | ICD-10-CM | POA: Diagnosis not present

## 2021-07-10 DIAGNOSIS — I5020 Unspecified systolic (congestive) heart failure: Secondary | ICD-10-CM | POA: Diagnosis not present

## 2021-07-10 DIAGNOSIS — L89159 Pressure ulcer of sacral region, unspecified stage: Secondary | ICD-10-CM | POA: Diagnosis not present

## 2021-07-10 DIAGNOSIS — F419 Anxiety disorder, unspecified: Secondary | ICD-10-CM | POA: Diagnosis not present

## 2021-07-10 DIAGNOSIS — I10 Essential (primary) hypertension: Secondary | ICD-10-CM | POA: Diagnosis not present

## 2021-07-12 ENCOUNTER — Encounter: Payer: Self-pay | Admitting: Family Medicine

## 2021-07-12 ENCOUNTER — Other Ambulatory Visit: Payer: Self-pay

## 2021-07-12 ENCOUNTER — Non-Acute Institutional Stay: Payer: Self-pay | Admitting: Family Medicine

## 2021-07-12 VITALS — HR 50 | Temp 98.1°F | Resp 20 | Wt 285.5 lb

## 2021-07-12 DIAGNOSIS — F410 Panic disorder [episodic paroxysmal anxiety] without agoraphobia: Secondary | ICD-10-CM | POA: Insufficient documentation

## 2021-07-12 DIAGNOSIS — M25522 Pain in left elbow: Secondary | ICD-10-CM | POA: Diagnosis not present

## 2021-07-12 DIAGNOSIS — L8990 Pressure ulcer of unspecified site, unspecified stage: Secondary | ICD-10-CM

## 2021-07-12 DIAGNOSIS — N1832 Chronic kidney disease, stage 3b: Secondary | ICD-10-CM | POA: Diagnosis not present

## 2021-07-12 DIAGNOSIS — R531 Weakness: Secondary | ICD-10-CM

## 2021-07-12 DIAGNOSIS — I480 Paroxysmal atrial fibrillation: Secondary | ICD-10-CM

## 2021-07-12 DIAGNOSIS — Z515 Encounter for palliative care: Secondary | ICD-10-CM | POA: Diagnosis not present

## 2021-07-12 DIAGNOSIS — R634 Abnormal weight loss: Secondary | ICD-10-CM

## 2021-07-12 DIAGNOSIS — L89153 Pressure ulcer of sacral region, stage 3: Secondary | ICD-10-CM | POA: Diagnosis not present

## 2021-07-12 DIAGNOSIS — I5022 Chronic systolic (congestive) heart failure: Secondary | ICD-10-CM

## 2021-07-12 NOTE — Progress Notes (Signed)
[facility] Palliative Care Consult Note Telephone: [phone]  Fax: [phone]    Date of encounter: 07/12/21 12:30 PM PATIENT NAME: [name] [id number]   [phone] (home)  DOB: 23-Apr-1941 MRN: [id number] PRIMARY CARE PROVIDER:    [name], MD,  [place] [place] [address] [phone]  REFERRING PROVIDER:   [name], MD [address] [place],  [place] [address] [phone]  RESPONSIBLE PARTY:    Contact Information     Name Relation Home Work Mobile   Mcarthy,Carolyn Spouse [phone]  [phone]   Lenoria Chime Daughter   [phone]        I met face to face with [name]. Palliative Care was asked to follow this patient by consultation request of  [name], MD to address advance care planning and complex medical decision making. This is a follow up visit.                            ASSESSMENT, SYMPTOM MANAGEMENT AND PLAN / RECOMMENDATIONS  Palliative Care Encounter Has DNR.  Need to address MOST with patient and wife. Pt should be encouraged to do what he can for himself, like using weighted silverware for self feeding.  2.    Chronic HFrEF EF 10/22 45-50%, global hypokinesis and diastolic dysfunction.   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 Continue Torsemide 20 mg daily.  3.    Pressure injury of skin, unstageable Agree with use of air mattress to prevent skin breakdown since pt is weak Continue wound care, recommend using Santyl on an necrotic eschar with medihoney for peri wound bed.   Change daily and prn soiling. Turn q 2 hours while awake  4.    Paroxysmal atrial fibrillation Slow ventricular rate today, asymptomatic.  May need to decrease Norvasc from 5 mg to 2.5 mg daily, if persistent Anticoagulated on Eliquis 5 mg BID  5.    Panic attacks Would recommend changing to  1 benzodiazepine with dose for breakthrough rather than having both Klonopin and Xanax.   Recommend Klonopin 0.5 mg BID with 3rd dose prn.  6.    Generalized weakness Currently in rehab to work with PT.  7.    CKD stage 3b Acute on chronic resolving.  Cr 1.81, BUN 46, GFR 38. Encourage hydration to 1.5 L daily.  8.    Hypomagnesemia Magnesium 1.7 on 07/04/21.  Agree with continuation of Mag ox 400 mg daily. Recommend repeat Mag level in 3 weeks.  Likely related to acute on chronic kidney disease.  9.  Weight Loss with protein calorie malnutrition Weight 03/05/21 351 lbs.  On 07/04/21 weight 285 lbs 7.9 ounces. Encourage protein supplementation, use Glucerna Has had achalasia treated with Botulinin toxin 06/05/21.  10.  Left elbow pain Question possible gout flare.  May need Prednisone if not self limited pain, continue to monitor.  Advance Care Planning/Goals of Care: Goals include to maximize quality of life and symptom management.      Need to discuss MOST and [facility] POA with patient and wife, evaluate goals of care Decision not to resuscitate due to poor prognosis. CODE STATUS: DNR    Follow up Palliative Care Visit: Palliative care will continue to follow for complex medical decision making, advance care planning, and clarification of goals. Return 4 weeks or prn.    This visit  was coded based on medical decision making (MDM).  PPS: 50%  HOSPICE ELIGIBILITY/DIAGNOSIS: TBD  Chief Complaint:  [name] received a referral to follow up with patient for chronic disease management on his admission to SNF Rehab, advance directive and defining/refining goals of care.   HISTORY OF PRESENT ILLNESS:  [name] is a 81 y.o. year old male with recent hospitalization for altered mental status with LLL pneumonia, sepsis and AKI on CKD. He has known fatty liver disease with elevated total bilirubin.  He c/o pain in his left elbow with minimal movement and pain in  his sacral area where he has a decubitus.  He was treated with Unasyn IV x 5 days.  He has chronic HR rEF and had some destabilization with panic attacks when taken off his benzodiazepines which have since been restarted.  He has known Achalasia and dysphagia with nursing staff feeding him due to tremors. He has pain with movement of his left elbow, denies pain when not moving.  He requires assistance with bathing and dressing currently.  History obtained from review of EMR, discussion with primary team, and interview with facility staff and/or [name].  I reviewed available labs, medications, imaging, studies and related documents from the EMR.  Records reviewed and summarized above.   ROS  General: NAD EYES: denies vision changes ENMT: endorses improved swallow Cardiovascular: denies chest pain, endorses DOE Pulmonary: denies cough, denies increased SOB Abdomen: endorses fair appetite, denies constipation, endorses bowel incontinence  GU: denies dysuria, endorses urinary incontinence MSK:  endorses significant increased weakness/fatigue,  no falls reported, Left elbow pain with movement Skin: denies rashes, painful wound over sacrum Neurological: denies insomnia Psych: Endorses intermittent anxiety improving Heme/lymph/immuno: denies bruises, abnormal bleeding  Physical Exam: Current and past weights: 07/04/21 weight 281 lbs 7.9 ounces Constitutional: NAD, appears chronically ill General: frail appearing, obese  EYES: anicteric sclera, lids intact, no discharge ENMT: intact hearing, oral mucous membranes moist, dentition intact CV: S1S2, IRIR, slow rate, no LE edema Pulmonary: CTAB, no increased work of breathing, no cough, room air Abdomen: normo-active BS + 4 quadrants, soft and non tender, no ascites GU: deferred MSK: no sarcopenia, moves all extremities, minimally ambulatory with assistance.  Left elbow with increased heat and erythema, tender Skin: warm and dry, sacral wound  dressed Neuro:  noted generalized weakness,  no cognitive impairment Psych: non-anxious affect, A and O x 3 Hem/lymph/immuno: no widespread bruising   Thank you for the opportunity to participate in the care of [name].  The palliative care team will continue to follow. Please call our office at [phone] if we can be of additional assistance.   Marijo Conception, FNP   COVID-19 PATIENT SCREENING TOOL Asked and negative response unless otherwise noted:   Have you had symptoms of covid, tested positive or been in contact with someone with symptoms/positive test in the past 5-10 days?  unknown

## 2021-07-13 DIAGNOSIS — I10 Essential (primary) hypertension: Secondary | ICD-10-CM | POA: Diagnosis not present

## 2021-07-13 DIAGNOSIS — R21 Rash and other nonspecific skin eruption: Secondary | ICD-10-CM | POA: Diagnosis not present

## 2021-07-13 DIAGNOSIS — I4891 Unspecified atrial fibrillation: Secondary | ICD-10-CM | POA: Diagnosis not present

## 2021-07-13 DIAGNOSIS — G20 Parkinson's disease: Secondary | ICD-10-CM | POA: Diagnosis not present

## 2021-07-13 DIAGNOSIS — I5020 Unspecified systolic (congestive) heart failure: Secondary | ICD-10-CM | POA: Diagnosis not present

## 2021-07-15 DIAGNOSIS — R634 Abnormal weight loss: Secondary | ICD-10-CM | POA: Insufficient documentation

## 2021-07-18 DIAGNOSIS — M1990 Unspecified osteoarthritis, unspecified site: Secondary | ICD-10-CM | POA: Diagnosis not present

## 2021-07-18 DIAGNOSIS — R1310 Dysphagia, unspecified: Secondary | ICD-10-CM | POA: Diagnosis not present

## 2021-07-18 DIAGNOSIS — M6281 Muscle weakness (generalized): Secondary | ICD-10-CM | POA: Diagnosis not present

## 2021-07-18 DIAGNOSIS — I10 Essential (primary) hypertension: Secondary | ICD-10-CM | POA: Diagnosis not present

## 2021-07-18 DIAGNOSIS — I4891 Unspecified atrial fibrillation: Secondary | ICD-10-CM | POA: Diagnosis not present

## 2021-07-18 DIAGNOSIS — F419 Anxiety disorder, unspecified: Secondary | ICD-10-CM | POA: Diagnosis not present

## 2021-07-18 DIAGNOSIS — I5020 Unspecified systolic (congestive) heart failure: Secondary | ICD-10-CM | POA: Diagnosis not present

## 2021-07-19 DIAGNOSIS — F419 Anxiety disorder, unspecified: Secondary | ICD-10-CM | POA: Diagnosis not present

## 2021-07-19 DIAGNOSIS — L89153 Pressure ulcer of sacral region, stage 3: Secondary | ICD-10-CM | POA: Diagnosis not present

## 2021-07-19 DIAGNOSIS — F39 Unspecified mood [affective] disorder: Secondary | ICD-10-CM | POA: Diagnosis not present

## 2021-07-19 DIAGNOSIS — L89313 Pressure ulcer of right buttock, stage 3: Secondary | ICD-10-CM | POA: Diagnosis not present

## 2021-07-20 DIAGNOSIS — I4891 Unspecified atrial fibrillation: Secondary | ICD-10-CM | POA: Diagnosis not present

## 2021-07-20 DIAGNOSIS — M25511 Pain in right shoulder: Secondary | ICD-10-CM | POA: Diagnosis not present

## 2021-07-20 DIAGNOSIS — I5020 Unspecified systolic (congestive) heart failure: Secondary | ICD-10-CM | POA: Diagnosis not present

## 2021-07-20 DIAGNOSIS — L89159 Pressure ulcer of sacral region, unspecified stage: Secondary | ICD-10-CM | POA: Diagnosis not present

## 2021-07-20 DIAGNOSIS — I10 Essential (primary) hypertension: Secondary | ICD-10-CM | POA: Diagnosis not present

## 2021-07-20 DIAGNOSIS — F419 Anxiety disorder, unspecified: Secondary | ICD-10-CM | POA: Diagnosis not present

## 2021-07-23 DIAGNOSIS — I4891 Unspecified atrial fibrillation: Secondary | ICD-10-CM | POA: Diagnosis not present

## 2021-07-23 DIAGNOSIS — L89159 Pressure ulcer of sacral region, unspecified stage: Secondary | ICD-10-CM | POA: Diagnosis not present

## 2021-07-23 DIAGNOSIS — I5020 Unspecified systolic (congestive) heart failure: Secondary | ICD-10-CM | POA: Diagnosis not present

## 2021-07-23 DIAGNOSIS — I10 Essential (primary) hypertension: Secondary | ICD-10-CM | POA: Diagnosis not present

## 2021-07-23 DIAGNOSIS — M25511 Pain in right shoulder: Secondary | ICD-10-CM | POA: Diagnosis not present

## 2021-07-23 DIAGNOSIS — G20 Parkinson's disease: Secondary | ICD-10-CM | POA: Diagnosis not present

## 2021-07-23 DIAGNOSIS — F419 Anxiety disorder, unspecified: Secondary | ICD-10-CM | POA: Diagnosis not present

## 2021-07-26 DIAGNOSIS — L89153 Pressure ulcer of sacral region, stage 3: Secondary | ICD-10-CM | POA: Diagnosis not present

## 2021-07-29 ENCOUNTER — Observation Stay (HOSPITAL_COMMUNITY)
Admission: EM | Admit: 2021-07-29 | Discharge: 2021-08-01 | Disposition: A | Discharge status: Skilled Nursing Facility | Payer: Medicare HMO | Attending: Internal Medicine | Admitting: Internal Medicine

## 2021-07-29 ENCOUNTER — Encounter (HOSPITAL_COMMUNITY): Payer: Self-pay | Admitting: Internal Medicine

## 2021-07-29 ENCOUNTER — Emergency Department (HOSPITAL_COMMUNITY): Payer: Medicare HMO

## 2021-07-29 DIAGNOSIS — K920 Hematemesis: Secondary | ICD-10-CM

## 2021-07-29 DIAGNOSIS — N261 Atrophy of kidney (terminal): Secondary | ICD-10-CM | POA: Diagnosis not present

## 2021-07-29 DIAGNOSIS — K5900 Constipation, unspecified: Secondary | ICD-10-CM | POA: Diagnosis not present

## 2021-07-29 DIAGNOSIS — Z20822 Contact with and (suspected) exposure to covid-19: Secondary | ICD-10-CM | POA: Insufficient documentation

## 2021-07-29 DIAGNOSIS — G20A1 Parkinson's disease without dyskinesia, without mention of fluctuations: Secondary | ICD-10-CM | POA: Diagnosis present

## 2021-07-29 DIAGNOSIS — I5032 Chronic diastolic (congestive) heart failure: Secondary | ICD-10-CM | POA: Insufficient documentation

## 2021-07-29 DIAGNOSIS — I110 Hypertensive heart disease with heart failure: Secondary | ICD-10-CM | POA: Insufficient documentation

## 2021-07-29 DIAGNOSIS — I5022 Chronic systolic (congestive) heart failure: Secondary | ICD-10-CM | POA: Diagnosis present

## 2021-07-29 DIAGNOSIS — K922 Gastrointestinal hemorrhage, unspecified: Secondary | ICD-10-CM | POA: Diagnosis not present

## 2021-07-29 DIAGNOSIS — Z79899 Other long term (current) drug therapy: Secondary | ICD-10-CM | POA: Insufficient documentation

## 2021-07-29 DIAGNOSIS — I10 Essential (primary) hypertension: Secondary | ICD-10-CM | POA: Diagnosis not present

## 2021-07-29 DIAGNOSIS — G4733 Obstructive sleep apnea (adult) (pediatric): Secondary | ICD-10-CM | POA: Diagnosis present

## 2021-07-29 DIAGNOSIS — N281 Cyst of kidney, acquired: Secondary | ICD-10-CM | POA: Diagnosis not present

## 2021-07-29 DIAGNOSIS — I480 Paroxysmal atrial fibrillation: Secondary | ICD-10-CM | POA: Diagnosis not present

## 2021-07-29 DIAGNOSIS — E039 Hypothyroidism, unspecified: Secondary | ICD-10-CM | POA: Diagnosis not present

## 2021-07-29 DIAGNOSIS — R2689 Other abnormalities of gait and mobility: Secondary | ICD-10-CM | POA: Diagnosis not present

## 2021-07-29 DIAGNOSIS — K8020 Calculus of gallbladder without cholecystitis without obstruction: Secondary | ICD-10-CM | POA: Diagnosis not present

## 2021-07-29 DIAGNOSIS — F32A Depression, unspecified: Secondary | ICD-10-CM

## 2021-07-29 DIAGNOSIS — F419 Anxiety disorder, unspecified: Secondary | ICD-10-CM | POA: Diagnosis present

## 2021-07-29 DIAGNOSIS — G20 Parkinson's disease: Secondary | ICD-10-CM | POA: Diagnosis present

## 2021-07-29 DIAGNOSIS — F329 Major depressive disorder, single episode, unspecified: Secondary | ICD-10-CM | POA: Insufficient documentation

## 2021-07-29 DIAGNOSIS — Z66 Do not resuscitate: Secondary | ICD-10-CM | POA: Diagnosis present

## 2021-07-29 LAB — URINALYSIS, ROUTINE W REFLEX MICROSCOPIC
Glucose, UA: NEGATIVE mg/dL
Ketones, ur: NEGATIVE mg/dL
Nitrite: NEGATIVE
Protein, ur: 100 mg/dL — AB
RBC / HPF: >50 RBC/hpf — ABNORMAL HIGH (ref 0–5)
Specific Gravity, Urine: >1.046 — ABNORMAL HIGH (ref 1.005–1.030)
WBC, UA: >50 WBC/hpf — ABNORMAL HIGH (ref 0–5)
pH: 5 (ref 5.0–8.0)

## 2021-07-29 LAB — CBC WITH DIFFERENTIAL/PLATELET
Abs Immature Granulocytes: 0.09 10*3/uL — ABNORMAL HIGH (ref 0.00–0.07)
Basophils Absolute: 0 10*3/uL (ref 0.0–0.1)
Basophils Relative: 0 %
Eosinophils Absolute: 0 10*3/uL (ref 0.0–0.5)
Eosinophils Relative: 0 %
HCT: 37.7 % — ABNORMAL LOW (ref 39.0–52.0)
Hemoglobin: 11.7 g/dL — ABNORMAL LOW (ref 13.0–17.0)
Immature Granulocytes: 1 %
Lymphocytes Relative: 3 %
Lymphs Abs: 0.4 10*3/uL — ABNORMAL LOW (ref 0.7–4.0)
MCH: 28.6 pg (ref 26.0–34.0)
MCHC: 31 g/dL (ref 30.0–36.0)
MCV: 92.2 fL (ref 80.0–100.0)
Monocytes Absolute: 0.8 10*3/uL (ref 0.1–1.0)
Monocytes Relative: 7 %
Neutro Abs: 9.9 10*3/uL — ABNORMAL HIGH (ref 1.7–7.7)
Neutrophils Relative %: 89 %
Platelets: 263 10*3/uL (ref 150–400)
RBC: 4.09 MIL/uL — ABNORMAL LOW (ref 4.22–5.81)
RDW: 15.1 % (ref 11.5–15.5)
WBC: 11.1 10*3/uL — ABNORMAL HIGH (ref 4.0–10.5)
nRBC: 0 % (ref 0.0–0.2)

## 2021-07-29 LAB — COMPREHENSIVE METABOLIC PANEL
ALT: 14 U/L (ref 0–44)
AST: 20 U/L (ref 15–41)
Albumin: 2.8 g/dL — ABNORMAL LOW (ref 3.5–5.0)
Alkaline Phosphatase: 84 U/L (ref 38–126)
Anion gap: 10 (ref 5–15)
BUN: 24 mg/dL — ABNORMAL HIGH (ref 8–23)
CO2: 26 mmol/L (ref 22–32)
Calcium: 9.7 mg/dL (ref 8.9–10.3)
Chloride: 100 mmol/L (ref 98–111)
Creatinine, Ser: 1.38 mg/dL — ABNORMAL HIGH (ref 0.61–1.24)
GFR, Estimated: 52 mL/min — ABNORMAL LOW (ref >60)
Glucose, Bld: 145 mg/dL — ABNORMAL HIGH (ref 70–99)
Potassium: 4.1 mmol/L (ref 3.5–5.1)
Sodium: 136 mmol/L (ref 135–145)
Total Bilirubin: 1.6 mg/dL — ABNORMAL HIGH (ref 0.3–1.2)
Total Protein: 6.4 g/dL — ABNORMAL LOW (ref 6.5–8.1)

## 2021-07-29 LAB — TYPE AND SCREEN
ABO/RH(D): A POS
Antibody Screen: NEGATIVE

## 2021-07-29 LAB — LIPASE, BLOOD: Lipase: 52 U/L — ABNORMAL HIGH (ref 11–51)

## 2021-07-29 LAB — ABO/RH: ABO/RH(D): A POS

## 2021-07-29 LAB — RESP PANEL BY RT-PCR (FLU A&B, COVID) ARPGX2
Influenza A by PCR: NEGATIVE
Influenza B by PCR: NEGATIVE
SARS Coronavirus 2 by RT PCR: NEGATIVE

## 2021-07-29 MED ORDER — ACETAMINOPHEN 650 MG RE SUPP: 650.0000 mg | Freq: Four times a day (QID) | RECTAL | Status: DC | PRN

## 2021-07-29 MED ORDER — ONDANSETRON HCL 4 MG/2ML IJ SOLN
4.0000 mg | Freq: Once | INTRAMUSCULAR | Status: AC
Start: 2021-07-29 — End: 2021-07-29
  Administered 2021-07-29: 4 mg via INTRAVENOUS
  Filled 2021-07-29: qty 2

## 2021-07-29 MED ORDER — PANTOPRAZOLE SODIUM 40 MG IV SOLR
40.0000 mg | Freq: Once | INTRAVENOUS | Status: AC
  Administered 2021-07-29: 40 mg via INTRAVENOUS
  Filled 2021-07-29: qty 10

## 2021-07-29 MED ORDER — SODIUM CHLORIDE 0.9% FLUSH
3.0000 mL | Freq: Two times a day (BID) | INTRAVENOUS | Status: DC
  Administered 2021-07-29 – 2021-08-01 (×6): 3 mL via INTRAVENOUS

## 2021-07-29 MED ORDER — LORAZEPAM 2 MG/ML IJ SOLN
1.0000 mg | Freq: Once | INTRAMUSCULAR | Status: AC
  Administered 2021-07-29: 1 mg via INTRAVENOUS
  Filled 2021-07-29: qty 1

## 2021-07-29 MED ORDER — TIAGABINE HCL 4 MG PO TABS
8.0000 mg | ORAL_TABLET | Freq: Two times a day (BID) | ORAL | Status: DC
  Administered 2021-07-29 – 2021-08-01 (×6): 8 mg via ORAL
  Filled 2021-07-29 (×7): qty 2

## 2021-07-29 MED ORDER — MORPHINE SULFATE (PF) 4 MG/ML IV SOLN
4.0000 mg | Freq: Once | INTRAVENOUS | Status: AC
  Administered 2021-07-29: 4 mg via INTRAVENOUS
  Filled 2021-07-29: qty 1

## 2021-07-29 MED ORDER — LACTATED RINGERS IV SOLN: INTRAVENOUS | Status: DC

## 2021-07-29 MED ORDER — ONDANSETRON HCL 4 MG PO TABS: 4.0000 mg | ORAL_TABLET | Freq: Four times a day (QID) | ORAL | Status: DC | PRN

## 2021-07-29 MED ORDER — CLONAZEPAM 0.25 MG PO TBDP
0.5000 mg | ORAL_TABLET | Freq: Two times a day (BID) | ORAL | Status: DC
  Administered 2021-07-29 – 2021-08-01 (×6): 0.5 mg via ORAL
  Filled 2021-07-29 (×6): qty 2

## 2021-07-29 MED ORDER — ACETAMINOPHEN 325 MG PO TABS: 650.0000 mg | ORAL_TABLET | Freq: Four times a day (QID) | ORAL | Status: DC | PRN

## 2021-07-29 MED ORDER — LEVOTHYROXINE SODIUM 25 MCG PO TABS
137.0000 ug | ORAL_TABLET | Freq: Every day | ORAL | Status: DC
  Administered 2021-07-30 – 2021-08-01 (×2): 137 ug via ORAL
  Filled 2021-07-29 (×3): qty 1

## 2021-07-29 MED ORDER — PANTOPRAZOLE SODIUM 40 MG IV SOLR
40.0000 mg | Freq: Two times a day (BID) | INTRAVENOUS | Status: DC
  Administered 2021-07-29 – 2021-08-01 (×6): 40 mg via INTRAVENOUS
  Filled 2021-07-29 (×6): qty 10

## 2021-07-29 MED ORDER — CYCLOSPORINE 0.05 % OP EMUL: 1.0000 [drp] | Freq: Two times a day (BID) | OPHTHALMIC | Status: DC | PRN

## 2021-07-29 MED ORDER — MELATONIN 5 MG PO TABS
5.0000 mg | ORAL_TABLET | Freq: Every day | ORAL | Status: DC
  Administered 2021-07-29: 5 mg via ORAL
  Administered 2021-07-30 – 2021-07-31 (×2): 10 mg via ORAL
  Filled 2021-07-29 (×2): qty 2
  Filled 2021-07-29: qty 1

## 2021-07-29 MED ORDER — IOHEXOL 300 MG/ML  SOLN
100.0000 mL | Freq: Once | INTRAMUSCULAR | Status: AC | PRN
  Administered 2021-07-29: 100 mL via INTRAVENOUS

## 2021-07-29 MED ORDER — ONDANSETRON HCL 4 MG/2ML IJ SOLN
4.0000 mg | Freq: Four times a day (QID) | INTRAMUSCULAR | Status: DC | PRN
  Administered 2021-07-30: 4 mg via INTRAVENOUS
  Filled 2021-07-29: qty 2

## 2021-07-29 MED ORDER — TRAMADOL HCL 50 MG PO TABS
50.0000 mg | ORAL_TABLET | Freq: Four times a day (QID) | ORAL | Status: DC | PRN
  Administered 2021-07-30 – 2021-08-01 (×4): 50 mg via ORAL
  Filled 2021-07-29 (×4): qty 1

## 2021-07-29 MED ORDER — AMLODIPINE BESYLATE 5 MG PO TABS
5.0000 mg | ORAL_TABLET | Freq: Every day | ORAL | Status: DC
  Administered 2021-07-30 – 2021-08-01 (×3): 5 mg via ORAL
  Filled 2021-07-29 (×3): qty 1

## 2021-07-29 MED ORDER — HYDROXYZINE HCL 25 MG PO TABS
25.0000 mg | ORAL_TABLET | Freq: Three times a day (TID) | ORAL | Status: DC | PRN
  Administered 2021-07-29 – 2021-07-31 (×3): 25 mg via ORAL
  Filled 2021-07-29 (×4): qty 1

## 2021-07-29 MED ORDER — MORPHINE SULFATE (PF) 2 MG/ML IV SOLN
2.0000 mg | INTRAVENOUS | Status: DC | PRN
  Administered 2021-07-31: 2 mg via INTRAVENOUS
  Filled 2021-07-29: qty 1

## 2021-07-29 MED ORDER — HYDRALAZINE HCL 20 MG/ML IJ SOLN: 5.0000 mg | INTRAMUSCULAR | Status: DC | PRN

## 2021-07-29 MED ORDER — ROSUVASTATIN CALCIUM 5 MG PO TABS
10.0000 mg | ORAL_TABLET | Freq: Every day | ORAL | Status: DC
  Administered 2021-07-30 – 2021-08-01 (×3): 10 mg via ORAL
  Filled 2021-07-29 (×3): qty 2

## 2021-07-29 NOTE — ED Notes (Signed)
Pt placed on hospital bed for comfort.

## 2021-07-29 NOTE — ED Provider Notes (Signed)
[facility] EMERGENCY DEPARTMENT Provider Note  CSN: [id number] Arrival date & time: 07/29/21 1215  Chief Complaint(s) Abdominal Pain and Hematemesis  HPI [name] is a 81 y.o. male with PMH T2DM, Parkinson's disease, psoriasis, CKD, HTN, pacemaker placement on Eliquis who presents emergency department for evaluation of abdominal pain and hematemesis.  Patient had a EGD performed in January of this year which found evidence of peptic ulcer disease.  Today, patient had multiple episodes of coffee-ground emesis prompting visit to the emergency department.  He currently endorses generalized abdominal pain but states that he feels that his nausea and vomiting have resolved.  Denies chest pain, shortness of breath, headache, cough, fever or other systemic symptoms.  Endorses constipation with no bowel movement in greater than 3 days.   Abdominal Pain Associated symptoms: constipation    Past Medical History Past Medical History:  Diagnosis Date   CKD (chronic kidney disease)    DM type 2 (diabetes mellitus, type 2) (HCC)    ED (erectile dysfunction)    Fatty liver    GERD (gastroesophageal reflux disease)    Gout    H/O adenomatous polyp of colon    HLD (hyperlipidemia)    HTN (hypertension)    Hypothyroid    Panic attacks    Parkinson disease ([place])    Psoriasis    Patient Active Problem List   Diagnosis Date Noted   Weight loss 07/15/2021   Palliative care encounter 07/12/2021   Pressure injury of skin 07/12/2021   Panic attacks 07/12/2021   Anxiety disorder 29/52/8413   Acute metabolic encephalopathy 24/40/1027   Generalized weakness 06/28/2021   Severe sepsis due to left lower lobe pneumonia 06/28/2021   Left lower lobe pneumonia 06/28/2021   Lactic acidosis 06/28/2021   AKI on CKD stage IIIb. 06/28/2021   Hypercalcemia 06/28/2021   Chronic kidney disease, stage 3b ([place]) 06/28/2021   HTN (hypertension) 06/28/2021   Chronic HFrEF (heart failure with  reduced ejection fraction) (EF 45-50% by echo on 03/23/2021) 06/28/2021   Hypomagnesemia 06/28/2021   OSA (obstructive sleep apnea) 06/28/2021   Achalasia-s/p EGD on 06/05/2021-injected with borderlining toxin. 06/28/2021   ?  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 Authorizing Provider  ACCU-CHEK SMARTVIEW test strip  12/23/19   [provider]  acetaminophen (TYLENOL) 500 MG tablet Take 1,000 mg by mouth every 6 (six) hours as needed for headache or mild pain.    [provider]  ALPRAZolam Duanne Moron) 0.5 MG tablet Take 1 tablet (0.5 mg total) by mouth 2 (two) times daily as needed for anxiety. 07/03/21   Thurnell Lose, MD  amLODipine (NORVASC) 5 MG tablet Take 1 tablet (5 mg total) by mouth daily. 07/04/21   Thurnell Lose, MD  apixaban (ELIQUIS) 5 MG TABS tablet Take 1 tablet (5 mg total) by mouth 2 (two) times daily. 03/19/21   Supple, Megan E, RPH-CPP  clonazePAM (KLONOPIN) 0.5 MG disintegrating tablet Take 1 tablet (0.5 mg total) by mouth 2 (two) times daily. 07/03/21   Thurnell Lose, MD  diphenhydrAMINE-zinc acetate (BENADRYL) cream Apply topically 2 (two) times daily as needed for itching.  07/03/21   Thurnell Lose, MD  levothyroxine (SYNTHROID) 137 MCG tablet Take 137 mcg by mouth daily before breakfast. 05/07/21   [provider]  melatonin 5 MG TABS Take 5-10 mg by mouth at bedtime.    [provider]  pantoprazole (PROTONIX) 40 MG tablet Take 40 mg by mouth daily before breakfast. 06/05/21   [provider]  RESTASIS 0.05 % ophthalmic emulsion Place 1 drop into both eyes 2 (two) times daily  as needed ("for dryness"). 06/09/19   [provider]  rosuvastatin (CRESTOR) 10 MG tablet Take 1 tablet (10 mg total) by mouth daily. 03/19/21   Supple, Megan E, RPH-CPP  tiaGABine (GABITRIL) 4 MG tablet Take 2 tablets (8 mg total) by mouth 2 (two) times daily. 07/03/21   Thurnell Lose, MD  torsemide (DEMADEX) 20 MG tablet Take 1 tablet (20 mg total) by mouth as needed (swelling). Patient taking differently: Take 20 mg by mouth daily as needed (for swelling). 05/04/21   Troy Sine, MD                                                                                                                                    Past Surgical History Past Surgical History:  Procedure Laterality Date   BOTOX INJECTION  06/05/2021   Procedure: BOTOX INJECTION;  Surgeon: Clarene Essex, MD;  Location: [facility];  Service: Endoscopy;;   ESOPHAGOGASTRODUODENOSCOPY (EGD) WITH PROPOFOL N/A 06/05/2021   Procedure: ESOPHAGOGASTRODUODENOSCOPY (EGD) WITH PROPOFOL;  Surgeon: Clarene Essex, MD;  Location: [facility];  Service: Endoscopy;  Laterality: N/A;  POSSIBLE DIL/POSSIBLE BOTOX   PACEMAKER IMPLANT N/A 11/24/2017   Procedure: PACEMAKER IMPLANT;  Surgeon: Deboraha Sprang, MD;  Location: [place] [facility];  Service: Cardiovascular;  Laterality: N/A;   Family History Family History  Problem Relation Age of Onset   Hypertension Mother    Cancer Sister    Aneurysm Sister     Social History Social History   Tobacco Use   Smoking status: Never   Smokeless tobacco: Never  Vaping Use   Vaping Use: Never used  Substance Use Topics   Alcohol use: No    Alcohol/week: 0.0 standard drinks   Drug use: No   Allergies Hydrochlorothiazide, Lexapro [escitalopram oxalate], Lexapro [escitalopram], Lisinopril, Silodosin, and Tamsulosin  Review of Systems Review of Systems  Gastrointestinal:  Positive for abdominal pain and constipation.       Hematemesis   Physical Exam Vital Signs  I have reviewed the  triage vital signs BP 114/74    Pulse 69    Temp 97.9 F (36.6 C) (Oral)    Resp 20    Ht 6' (1.829 m) Comment: per facility record   Wt 126.1 kg Comment: per facility record   SpO2 95%    BMI 37.70 kg/m   Physical Exam Vitals and nursing note reviewed.  Constitutional:      General:  He is not in acute distress.    Appearance: He is well-developed.  HENT:     Head: Normocephalic and atraumatic.  Eyes:     Conjunctiva/sclera: Conjunctivae normal.  Cardiovascular:     Rate and Rhythm: Normal rate and regular rhythm.     Heart sounds: No murmur heard. Pulmonary:     Effort: Pulmonary effort is normal. No respiratory distress.     Breath sounds: Normal breath sounds.  Abdominal:     General: There is distension.     Palpations: Abdomen is soft.     Tenderness: There is generalized abdominal tenderness.  Musculoskeletal:        General: No swelling.     Cervical back: Neck supple.  Skin:    General: Skin is warm and dry.     Capillary Refill: Capillary refill takes less than 2 seconds.  Neurological:     Mental Status: He is alert.  Psychiatric:        Mood and Affect: Mood normal.    ED Results and Treatments Labs (all labs ordered are listed, but only abnormal results are displayed) Labs Reviewed  CBC WITH DIFFERENTIAL/PLATELET - Abnormal; Notable for the following components:      Result Value   WBC 11.1 (*)    RBC 4.09 (*)    Hemoglobin 11.7 (*)    HCT 37.7 (*)    Neutro Abs 9.9 (*)    Lymphs Abs 0.4 (*)    Abs Immature Granulocytes 0.09 (*)    All other components within normal limits  COMPREHENSIVE METABOLIC PANEL - Abnormal; Notable for the following components:   Glucose, Bld 145 (*)    BUN 24 (*)    Creatinine, Ser 1.38 (*)    Total Protein 6.4 (*)    Albumin 2.8 (*)    Total Bilirubin 1.6 (*)    GFR, Estimated 52 (*)    All other components within normal limits  LIPASE, BLOOD - Abnormal; Notable for the following components:   Lipase 52 (*)    All  other components within normal limits  URINALYSIS, ROUTINE W REFLEX MICROSCOPIC                                                                                                                          Radiology No results found.  Pertinent labs & imaging results that were available during my care of the patient were reviewed by me and considered in my medical decision making (see MDM for details).  Medications Ordered in ED Medications  pantoprazole (PROTONIX) injection 40 mg (40 mg Intravenous Given 07/29/21 1315)  Procedures Procedures  (including critical care time)  Medical Decision Making / ED Course   This patient presents to the ED for concern of hematemesis, abdominal pain, this involves an extensive number of treatment options, and is a complaint that carries with it a high risk of complications and morbidity.  The differential diagnosis includes peptic ulcer disease, upper GI bleed, epistaxis with swallowed blood, AVM, constipation  MDM: Patient seen emergency department for evaluation of hematemesis and abdominal pain.  Physical exam reveals a distended abdomen with generalized mild tenderness to palpation.  Laboratory valuation with a leukocytosis to 11.1, hemoglobin 11.7 which is decreased from a baseline of 13 3 weeks ago.  Creatinine of 1.38, BUN 24, lipase slightly elevated 52.  CT abdomen pelvis with large stool burden.  GI consulted for peptic ulcer disease with coffee-ground emesis on a blood thinner and they will evaluate the patient while in the hospital.  They are requesting medical admission at this time.  Patient started on Protonix and admitted to medicine.  Of note, patient is very concerned that he will lose his room at his facility if he is in the hospital for greater than 24 hours.  Patient requesting case management assistance in  calling his facility to ensure this does not happen.   Additional history obtained: -Additional history obtained from wife -External records from outside source obtained and reviewed including: Chart review including previous notes, labs, imaging, consultation notes   Lab Tests: -I ordered, reviewed, and interpreted labs.   The pertinent results include:   Labs Reviewed  CBC WITH DIFFERENTIAL/PLATELET - Abnormal; Notable for the following components:      Result Value   WBC 11.1 (*)    RBC 4.09 (*)    Hemoglobin 11.7 (*)    HCT 37.7 (*)    Neutro Abs 9.9 (*)    Lymphs Abs 0.4 (*)    Abs Immature Granulocytes 0.09 (*)    All other components within normal limits  COMPREHENSIVE METABOLIC PANEL - Abnormal; Notable for the following components:   Glucose, Bld 145 (*)    BUN 24 (*)    Creatinine, Ser 1.38 (*)    Total Protein 6.4 (*)    Albumin 2.8 (*)    Total Bilirubin 1.6 (*)    GFR, Estimated 52 (*)    All other components within normal limits  LIPASE, BLOOD - Abnormal; Notable for the following components:   Lipase 52 (*)    All other components within normal limits  URINALYSIS, ROUTINE W REFLEX MICROSCOPIC       Imaging Studies ordered: I ordered imaging studies including CTAP I independently visualized and interpreted imaging. I agree with the radiologist interpretation   Medicines ordered and prescription drug management: Meds ordered this encounter  Medications   pantoprazole (PROTONIX) injection 40 mg    -I have reviewed the patients home medicines and have made adjustments as needed  Critical interventions none  Consultations Obtained: I requested consultation with the gastroenterologist,  and discussed lab and imaging findings as well as pertinent plan - they recommend: Protonix medical admission   Cardiac Monitoring: The patient was maintained on a cardiac monitor.  I personally viewed and interpreted the cardiac monitored which showed an underlying  rhythm of: Paced ventricular rhythm  Social Determinants of Health:  Factors impacting patients care include: None   Reevaluation: After the interventions noted above, I reevaluated the patient and found that they have :stayed the same  Co morbidities that complicate the patient  evaluation  Past Medical History:  Diagnosis Date   CKD (chronic kidney disease)    DM type 2 (diabetes mellitus, type 2) (HCC)    ED (erectile dysfunction)    Fatty liver    GERD (gastroesophageal reflux disease)    Gout    H/O adenomatous polyp of colon    HLD (hyperlipidemia)    HTN (hypertension)    Hypothyroid    Panic attacks    Parkinson disease (HCC)    Psoriasis       Dispostion: I considered admission for this patient, and in the setting of emesis on a blood thinner with known peptic ulcer disease patient will be admitted.     Final Clinical Impression(s) / ED Diagnoses Final diagnoses:  None     @PCDICTATION @    Arnett Duddy, Debe Coder, MD 07/29/21 1536

## 2021-07-29 NOTE — ED Notes (Signed)
Mepilex placed on stage III sacral wound.

## 2021-07-29 NOTE — Assessment & Plan Note (Signed)
-  Has paperwork at the bedside

## 2021-07-29 NOTE — ED Notes (Signed)
Soap suds enema administered. Pt unable to have a bowel movement at this time.

## 2021-07-29 NOTE — ED Notes (Signed)
Secretary to order hospital bed for pt

## 2021-07-29 NOTE — Assessment & Plan Note (Addendum)
-  Patient is presenting with hematemesis, suggestive of upper GI bleeding. 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 -Continue with PPI.  -Underwent endoscopy 2/28: Grade D erosive esophagitis with no bleeding, Gastritis.  Patient will need PPI BID for 4 weeks, then daily for 4 to 8 weeks. Started on Carafate 1 gr BID for 2 weeks.

## 2021-07-29 NOTE — H&P (Signed)
History and Physical    Patient: [name] OYD:[id number] DOB: [birth year] DOA: 07/29/2021 DOS: the patient was seen and examined on 07/29/2021 PCP: [name], MD  Patient coming from: SNF - [facility]; NOK: Wife, [phone]   Chief Complaint: Coffee ground emesis  HPI: [name] is a 81 y.o. male with medical history significant of DM; CKD; HTN; HLD; chronic systolic CHF; pacemaker; afib on Eliquis; hypothyroidism; and Parkinson's presenting with coffee ground emesis. He had an EGD in January with peptic ulcer disease.  He was last hospitalized from 1/25-2/1 for AMS, achalasia with botox injection on 1/3 and given dysphagia 1 diet with thin liquids.  He reports that he starting spitting up blood this AM.  It was black and he would clear his throat.  He vomited last night but it wasn't blood then.  He denies dysphagia. He is no longer on a dysphagia diet.  He has been constipated, hasn't been to the bathroom in a week.    ER Course:  h/o PUD on EGD in January.  On Eliquis for afib.  Today with coffee ground emesis.  No further vomiting.  +constipation, abdominal pain.  GI will scope when off Eliquis, took this AM.  Giving an enema.     Review of Systems: As mentioned in the history of present illness. All other systems reviewed and are negative. Past Medical History:  Diagnosis Date   CKD (chronic kidney disease)    DM type 2 (diabetes mellitus, type 2) (HCC)    ED (erectile dysfunction)    Fatty liver    GERD (gastroesophageal reflux disease)    Gout    H/O adenomatous polyp of colon    HLD (hyperlipidemia)    HTN (hypertension)    Hypothyroid    Panic attacks    Parkinson disease ([place])    Psoriasis    Past Surgical History:  Procedure Laterality Date   BOTOX INJECTION  06/05/2021   Procedure: BOTOX INJECTION;  Surgeon: Clarene Essex, MD;  Location: [facility];  Service: Endoscopy;;   ESOPHAGOGASTRODUODENOSCOPY (EGD) WITH PROPOFOL N/A 06/05/2021   Procedure:  ESOPHAGOGASTRODUODENOSCOPY (EGD) WITH PROPOFOL;  Surgeon: Clarene Essex, MD;  Location: [facility];  Service: Endoscopy;  Laterality: N/A;  POSSIBLE DIL/POSSIBLE BOTOX   PACEMAKER IMPLANT N/A 11/24/2017   Procedure: PACEMAKER IMPLANT;  Surgeon: Deboraha Sprang, MD;  Location: [place] [facility];  Service: Cardiovascular;  Laterality: N/A;   Social History:  reports that he has quit smoking. His smoking use included cigars. He has never used smokeless tobacco. He reports that he does not drink alcohol and does not use drugs.  Allergies  Allergen Reactions   Hydrochlorothiazide Other (See Comments)    "gout flares"   Lexapro [Escitalopram] Other (See Comments)    Ineffective for anxiety (10/2011)   Lisinopril Swelling and Other (See Comments)    Angioedema    Silodosin Diarrhea   Tamsulosin Other (See Comments)    Visual changes    Family History  Problem Relation Age of Onset   Hypertension Mother    Cancer Sister    Aneurysm Sister     Prior to Admission medications   Medication Sig Start Date End Date Taking? Authorizing Provider  acetaminophen (TYLENOL) 500 MG tablet Take 1,000 mg by mouth every 6 (six) hours as needed for headache or mild pain.   Yes [provider]  ALPRAZolam (XANAX) 0.5 MG tablet Take 1 tablet (0.5 mg total) by mouth 2 (two) times daily as needed for anxiety. 07/03/21  Yes Thurnell Lose, MD  amLODipine (NORVASC) 5 MG tablet Take 1 tablet (5 mg total) by mouth daily. 07/04/21  Yes Thurnell Lose, MD  apixaban (ELIQUIS) 5 MG TABS tablet Take 1 tablet (5 mg total) by mouth 2 (two) times daily. 03/19/21  Yes Supple, Megan E, RPH-CPP  clonazePAM (KLONOPIN) 0.5 MG disintegrating tablet Take 1 tablet (0.5 mg total) by mouth 2 (two) times daily. 07/03/21  Yes Thurnell Lose, MD  diphenhydrAMINE-zinc acetate (BENADRYL) cream Apply topically 2 (two) times daily as needed for itching. Patient taking differently: Apply 1 application topically 2 (two) times  daily as needed for itching. 07/03/21  Yes Thurnell Lose, MD  hydrOXYzine (ATARAX) 25 MG tablet Take 25 mg by mouth every 8 (eight) hours as needed for itching.   Yes [provider]  levothyroxine (SYNTHROID) 137 MCG tablet Take 137 mcg by mouth daily before breakfast. 05/07/21  Yes [provider]  lidocaine (LIDODERM) 5 % Place 1 patch onto the skin daily. Apply to right shoulder and bilateral knees. Remove & Discard patch within 12 hours.   Yes [provider]  melatonin 5 MG TABS Take 5-10 mg by mouth at bedtime.   Yes [provider]  Multiple Vitamins-Minerals (DECUBI-VITE) CAPS Take 1 capsule by mouth daily.   Yes [provider]  pantoprazole (PROTONIX) 40 MG tablet Take 40 mg by mouth daily before breakfast. 06/05/21  Yes [provider]  Pollen Extracts (PROSTAT PO) Take 30 mLs by mouth in the morning and at bedtime.   Yes [provider]  RESTASIS 0.05 % ophthalmic emulsion Place 1 drop into both eyes 2 (two) times daily as needed (dryness). 06/09/19  Yes [provider]  rosuvastatin (CRESTOR) 10 MG tablet Take 1 tablet (10 mg total) by mouth daily. 03/19/21  Yes Supple, Megan E, RPH-CPP  tiaGABine (GABITRIL) 4 MG tablet Take 2 tablets (8 mg total) by mouth 2 (two) times daily. 07/03/21  Yes Thurnell Lose, MD  torsemide (DEMADEX) 20 MG tablet Take 1 tablet (20 mg total) by mouth as needed (swelling). Patient taking differently: Take 20 mg by mouth daily as needed (swelling). 05/04/21  Yes Troy Sine, MD  traMADol (ULTRAM) 50 MG tablet Take 50 mg by mouth every 6 (six) hours as needed (pain).   Yes [provider]    Physical Exam: Vitals:   07/29/21 1330 07/29/21 1415 07/29/21 1501 07/29/21 1645  BP: 108/82 109/69 95/60 120/89  Pulse: 80 83 87 85  Resp: 17 13 18 17   Temp:      TempSrc:      SpO2: 98% 97% 97% 94%  Weight:      Height:       General:  Appears calm and comfortable and is in  NAD Eyes:  PERRL, EOMI, normal lids, iris ENT:  grossly normal hearing, lips & tongue, mmm; poor dentition Neck:  no LAD, masses or thyromegaly Cardiovascular:  RRR, no m/r/g. No LE edema.  Respiratory:   CTA bilaterally with no wheezes/rales/rhonchi.  Normal respiratory effort. Abdomen:  soft, NT, ND Skin:  no rash or induration seen on limited exam Musculoskeletal:  grossly normal tone BUE/BLE, good ROM, no bony abnormality Psychiatric:  cantankerous mood and affect, speech mildly dysarthric but appropriate, AOx3 Neurologic:  CN 2-12 grossly intact, moves all extremities in coordinated fashion   Radiological Exams on Admission: Independently reviewed - see discussion in A/P where applicable  CT ABDOMEN PELVIS W CONTRAST  Result Date:  07/29/2021 CLINICAL DATA:  Epigastric abdominal pain. Coffee-ground emesis yesterday and last night. History of ulcer disease. EXAM: CT ABDOMEN AND PELVIS WITH CONTRAST TECHNIQUE: Multidetector CT imaging of the abdomen and pelvis was performed using the standard protocol following bolus administration of intravenous contrast. RADIATION DOSE REDUCTION: This exam was performed according to the departmental dose-optimization program which includes automated exposure control, adjustment of the mA and/or kV according to patient size and/or use of iterative reconstruction technique. CONTRAST:  174mL OMNIPAQUE IOHEXOL 300 MG/ML  SOLN COMPARISON:  02/17/2004 FINDINGS: Lower chest: Lung bases are clear except for what probably represents mild chronic scarring. The heart is enlarged. No pleural or pericardial fluid. Pacemaker leads in place. Hepatobiliary: Liver parenchyma is normal. Numerous calcified gallstones dependent in the gallbladder. Largest stone measures approximately 12 mm in diameter. No CT evidence of cholecystitis or obstruction. Pancreas: Normal Spleen: Normal Adrenals/Urinary Tract: Adrenal glands are normal. Some renal atrophy. Bilateral renal cysts without  evidence of stone, mass or obstruction. Foley catheter in the bladder. Stomach/Bowel: The stomach is full of fluid and some air. No visible abnormality of the duodenum. No evidence of small-bowel obstruction. Normal appendix. Normal amount of fecal matter and air within the colon. Diverticulosis of the left colon without evidence of diverticulitis. Large amount of fecal matter within the rectosigmoid. Vascular/Lymphatic: Aorta shows atherosclerotic change but no aneurysm. IVC is normal. No adenopathy. Reproductive: Normal Other: No free fluid or air. Musculoskeletal: Ordinary lumbar degenerative changes are present. IMPRESSION: The stomach is full of fluid with some air. No evidence of gastric outlet obstruction. No visible ulcer disease by CT, which is not particularly sensitive. No small bowel obstruction. Fecal matter and air within the colon, with a large amount of stool in the rectosigmoid region. Diverticulosis without evidence of diverticulitis. Cholelithiasis without CT evidence of cholecystitis or obstruction. Aortic Atherosclerosis (ICD10-I70.0). Electronically Signed   By: Nelson Chimes M.D.   On: 07/29/2021 15:05    EKG: Independently reviewed.  Ventricular paced rhythm with rate 95; NSCSLT   Labs on Admission: I have personally reviewed the available labs and imaging studies at the time of the admission.  Pertinent labs:    Glucose 145 BUN 24/Creatinine 1.38/GFR 52 - improved Albumin 2.8 Bili 1.6 WBC 11.1 Hgb 11.7; 13.3 on 2/1    Assessment and Plan: * Upper GI bleeding- (present on admission) -Patient is presenting with hematemesis, suggestive of upper GI bleeding. -Patient has history of an EGD on 1/3 without significant findings other than possible achalasia with a hypertonic LES that was injected with Botox -Most likely diagnosis is gastric or duodenal ulcer, esophagitis or gastritis, or Mallory-Weiss tear.  -Will admit to telemetry -GI consulted by ED, will follow up  recommendations -NPO after MN for possible EGD -LR at 100 mL/hr -Start IV pantoprazole 40 BID for patients with ongoing bleeding who need urgent EGD -Zofran IV for nausea -Avoid NSAIDs and SQ heparin -Maintain IV access (2 large bore IVs if possible). -Hold Eliquis - last dose was this AM -Type and screen ordered -Monitor closely and follow cbc q12h, transfuse as necessary for Hbg <8 (with h/o cardiac disease)  DNR (do not resuscitate)- (present on admission) -Has paperwork at the bedside  Constipation- (present on admission) -Significant constipation appreciated on imaging -Enema ordered by EDP  Anxiety disorder- (present on admission) -Reportedly tapered off BZD previously but started on BID Klonopin during last hospitalization, will continue -Continue Gabitril and prn hydroxyzine  OSA (obstructive sleep apnea)- (present on admission) -Noncompliant with BIPAP -  Will order and encourage use  Chronic HFrEF (heart failure with reduced ejection fraction) (EF 45-50% by echo on 03/23/2021)- (present on admission) -Appears compensated at this time -Will monitor in the setting of UGI bleed, constipation, and IVF -Has pacemaker, not MRI compatible reportedly  HTN (hypertension)- (present on admission) -Continue Crestor  Hypothyroidism-TSH 1.345 on 1/23- (present on admission) -Continue Synthroid  ?  Idiopathic Parkinson disease - (present on admission) -Undergoing evaluation by neurology -Not currently on Sinemet  Paroxysmal atrial fibrillation ([place])- (present on admission) -Rate controlled without medication -Has pacemaker -Eliquis last dose was this AM, holding due to UGI bleeding   *Note: patient reports buttock pressure ulcers.  Will order air mattress and request wound care consult.   Advance Care Planning:   Code Status: DNR   Consults: GI; wound care  DVT Prophylaxis: SCDs  Family Communication: Wife was present throughout evaluation  Severity of Illness: The  appropriate patient status for this patient is INPATIENT. Inpatient status is judged to be reasonable and necessary in order to provide the required intensity of service to ensure the patient's safety. The patient's presenting symptoms, physical exam findings, and initial radiographic and laboratory data in the context of their chronic comorbidities is felt to place them at high risk for further clinical deterioration. Furthermore, it is not anticipated that the patient will be medically stable for discharge from the hospital within 2 midnights of admission.   * I certify that at the point of admission it is my clinical judgment that the patient will require inpatient hospital care spanning beyond 2 midnights from the point of admission due to high intensity of service, high risk for further deterioration and high frequency of surveillance required.*  Author: Karmen Bongo, MD 07/29/2021 6:19 PM  For on call review [email or web address].

## 2021-07-29 NOTE — Assessment & Plan Note (Signed)
Continue Synthroid °

## 2021-07-29 NOTE — ED Triage Notes (Addendum)
Pt BIB [facility] from nursing facility- [facility].  Yesterday pt reports having coffee ground emesis that continued throughout the night. Recently dx with stomach ulcers. Pain located lower R and L abd area. Pt has not had bowel mvmnt in 1 week.   BP-14-/80 SpO2-98 HR-84 CBG-153

## 2021-07-29 NOTE — Assessment & Plan Note (Addendum)
-  Reportedly tapered off BZD previously but started on BID Klonopin during last hospitalization, will continue -Continue Gabitril and prn hydroxyzine. -He also takes Xanax twice daily as needed.

## 2021-07-29 NOTE — Assessment & Plan Note (Signed)
Continue Crestor 

## 2021-07-29 NOTE — ED Notes (Signed)
Patient transported to CT 

## 2021-07-29 NOTE — Assessment & Plan Note (Addendum)
-  Significant constipation appreciated on imaging -Had BM 2/27 after enema.  -Continue with  Miralax, senna.

## 2021-07-29 NOTE — Assessment & Plan Note (Addendum)
-  Rate controlled without medication -Has pacemaker. -Eliquis last dose was 2/26., holding due to UGI bleeding. Per GI ok to resume Eliquis in 2 days.

## 2021-07-29 NOTE — Assessment & Plan Note (Addendum)
-  Undergoing evaluation by neurology -Not currently on Sinemet. Needs follow up with Neurologist.

## 2021-07-29 NOTE — Assessment & Plan Note (Addendum)
-  resume BIPAP at discharge

## 2021-07-29 NOTE — Assessment & Plan Note (Addendum)
-  Has pacemaker. -Compensated. PRN torsemide.

## 2021-07-29 NOTE — Progress Notes (Signed)
Patient states he does not wear CPAP at home and refused at this time. Currently on 21% 96% Spo2.

## 2021-07-30 DIAGNOSIS — K922 Gastrointestinal hemorrhage, unspecified: Secondary | ICD-10-CM | POA: Diagnosis not present

## 2021-07-30 LAB — BASIC METABOLIC PANEL
Anion gap: 9 (ref 5–15)
BUN: 34 mg/dL — ABNORMAL HIGH (ref 8–23)
CO2: 26 mmol/L (ref 22–32)
Calcium: 9.2 mg/dL (ref 8.9–10.3)
Chloride: 99 mmol/L (ref 98–111)
Creatinine, Ser: 1.62 mg/dL — ABNORMAL HIGH (ref 0.61–1.24)
GFR, Estimated: 43 mL/min — ABNORMAL LOW (ref >60)
Glucose, Bld: 115 mg/dL — ABNORMAL HIGH (ref 70–99)
Potassium: 4.2 mmol/L (ref 3.5–5.1)
Sodium: 134 mmol/L — ABNORMAL LOW (ref 135–145)

## 2021-07-30 LAB — URINALYSIS, MICROSCOPIC (REFLEX)
RBC / HPF: >50 RBC/hpf (ref 0–5)
WBC, UA: >50 WBC/hpf (ref 0–5)

## 2021-07-30 LAB — CBC
HCT: 31.2 % — ABNORMAL LOW (ref 39.0–52.0)
HCT: 31.9 % — ABNORMAL LOW (ref 39.0–52.0)
Hemoglobin: 10.4 g/dL — ABNORMAL LOW (ref 13.0–17.0)
Hemoglobin: 10.5 g/dL — ABNORMAL LOW (ref 13.0–17.0)
MCH: 29.8 pg (ref 26.0–34.0)
MCH: 29.5 pg (ref 26.0–34.0)
MCHC: 33.3 g/dL (ref 30.0–36.0)
MCHC: 32.9 g/dL (ref 30.0–36.0)
MCV: 89.4 fL (ref 80.0–100.0)
MCV: 89.6 fL (ref 80.0–100.0)
Platelets: 229 10*3/uL (ref 150–400)
Platelets: 230 10*3/uL (ref 150–400)
RBC: 3.49 MIL/uL — ABNORMAL LOW (ref 4.22–5.81)
RBC: 3.56 MIL/uL — ABNORMAL LOW (ref 4.22–5.81)
RDW: 15.1 % (ref 11.5–15.5)
RDW: 14.9 % (ref 11.5–15.5)
WBC: 10.2 10*3/uL (ref 4.0–10.5)
WBC: 11.4 10*3/uL — ABNORMAL HIGH (ref 4.0–10.5)
nRBC: 0 % (ref 0.0–0.2)
nRBC: 0 % (ref 0.0–0.2)

## 2021-07-30 LAB — URINALYSIS, ROUTINE W REFLEX MICROSCOPIC

## 2021-07-30 MED ORDER — MINERAL OIL RE ENEM
1.0000 | ENEMA | Freq: Once | RECTAL | Status: AC
  Administered 2021-07-30: 1 via RECTAL
  Filled 2021-07-30: qty 1

## 2021-07-30 MED ORDER — CHLORHEXIDINE GLUCONATE CLOTH 2 % EX PADS
6.0000 | MEDICATED_PAD | Freq: Every day | CUTANEOUS | Status: DC
  Administered 2021-07-30 – 2021-08-01 (×3): 6 via TOPICAL

## 2021-07-30 MED ORDER — GERHARDT'S BUTT CREAM
TOPICAL_CREAM | CUTANEOUS | Status: DC | PRN
  Filled 2021-07-30: qty 1

## 2021-07-30 MED ORDER — POLYETHYLENE GLYCOL 3350 17 G PO PACK
17.0000 g | PACK | Freq: Two times a day (BID) | ORAL | Status: DC
  Administered 2021-07-30 – 2021-08-01 (×4): 17 g via ORAL
  Filled 2021-07-30 (×4): qty 1

## 2021-07-30 MED ORDER — ALPRAZOLAM 0.5 MG PO TABS
0.5000 mg | ORAL_TABLET | Freq: Two times a day (BID) | ORAL | Status: DC | PRN
  Administered 2021-07-30 – 2021-08-01 (×3): 0.5 mg via ORAL
  Filled 2021-07-30 (×3): qty 1

## 2021-07-30 MED ORDER — SENNA 8.6 MG PO TABS
1.0000 | ORAL_TABLET | Freq: Every day | ORAL | Status: DC
  Administered 2021-07-30 – 2021-08-01 (×3): 8.6 mg via ORAL
  Filled 2021-07-30 (×3): qty 1

## 2021-07-30 MED ORDER — SODIUM CHLORIDE 0.9 % IV SOLN
1.0000 g | INTRAVENOUS | Status: DC
  Administered 2021-07-30 – 2021-08-01 (×3): 1 g via INTRAVENOUS
  Filled 2021-07-30 (×3): qty 10

## 2021-07-30 MED ORDER — NYSTATIN 100000 UNIT/GM EX POWD
CUTANEOUS | Status: DC | PRN
  Filled 2021-07-30: qty 15

## 2021-07-30 MED ORDER — SENNA 8.6 MG PO TABS: 1.0000 | ORAL_TABLET | Freq: Every day | ORAL | Status: DC

## 2021-07-30 NOTE — H&P (View-Only) (Signed)
Referring Provider:  [place] Primary Care Physician:  [name], MD Primary Gastroenterologist:  Dr. Watt Climes  Reason for Consultation: Coffee-ground emesis  HPI: [name] is a 81 y.o. male with past medical history of atrial fibrillation on Eliquis, history of CHF, history of pacemaker placement, history of Parkinson's disease presented to the hospital with coffee-ground emesis.  Patient with history of dysphagia with abnormal motility.  Underwent EGD by Dr. Watt Climes on June 05, 2021 with Botox injection.  He was also found to have gastritis and few tiny duodenal bulb ulcers.  Hemoglobin dropped to 10.4.  Hemoglobin was normal at 13.3 earlier this month.  Normal LFTs.  Mild elevated lipase at 52.  CT abdomen pelvis with contrast yesterday showed no acute changes with fluid-filled stomach without evidence of gastric outlet obstruction.  Also showed constipation and gallstones.  Patient seen and examined at bedside.  Patient's wife at bedside.  Patient lives in skilled nursing facility.  Had 1 episode of coffee-ground emesis yesterday.  Denies any blood in the stool or black stool.  Mild abdominal discomfort.  No improvement in dysphagia with Botox injection last month according to patient.    Past Medical History:  Diagnosis Date   CKD (chronic kidney disease)    DM type 2 (diabetes mellitus, type 2) (HCC)    ED (erectile dysfunction)    Fatty liver    GERD (gastroesophageal reflux disease)    Gout    H/O adenomatous polyp of colon    HLD (hyperlipidemia)    HTN (hypertension)    Hypothyroid    Panic attacks    Parkinson disease ([place])    Psoriasis     Past Surgical History:  Procedure Laterality Date   BOTOX INJECTION  06/05/2021   Procedure: BOTOX INJECTION;  Surgeon: Clarene Essex, MD;  Location: [facility];  Service: Endoscopy;;   ESOPHAGOGASTRODUODENOSCOPY (EGD) WITH PROPOFOL N/A 06/05/2021   Procedure: ESOPHAGOGASTRODUODENOSCOPY (EGD) WITH PROPOFOL;  Surgeon: Clarene Essex,  MD;  Location: [facility];  Service: Endoscopy;  Laterality: N/A;  POSSIBLE DIL/POSSIBLE BOTOX   PACEMAKER IMPLANT N/A 11/24/2017   Procedure: PACEMAKER IMPLANT;  Surgeon: Deboraha Sprang, MD;  Location: [facility];  Service: Cardiovascular;  Laterality: N/A;    Prior to Admission medications   Medication Sig Start Date End Date Taking? Authorizing Provider  acetaminophen (TYLENOL) 500 MG tablet Take 1,000 mg by mouth every 6 (six) hours as needed for headache or mild pain.   Yes [provider]  ALPRAZolam (XANAX) 0.5 MG tablet Take 1 tablet (0.5 mg total) by mouth 2 (two) times daily as needed for anxiety. 07/03/21  Yes Thurnell Lose, MD  amLODipine (NORVASC) 5 MG tablet Take 1 tablet (5 mg total) by mouth daily. 07/04/21  Yes Thurnell Lose, MD  apixaban (ELIQUIS) 5 MG TABS tablet Take 1 tablet (5 mg total) by mouth 2 (two) times daily. 03/19/21  Yes Supple, Megan E, RPH-CPP  clonazePAM (KLONOPIN) 0.5 MG disintegrating tablet Take 1 tablet (0.5 mg total) by mouth 2 (two) times daily. 07/03/21  Yes Thurnell Lose, MD  diphenhydrAMINE-zinc acetate (BENADRYL) cream Apply topically 2 (two) times daily as needed for itching. Patient taking differently: Apply 1 application topically 2 (two) times daily as needed for itching. 07/03/21  Yes Thurnell Lose, MD  hydrOXYzine (ATARAX) 25 MG tablet Take 25 mg by mouth every 8 (eight) hours as needed for itching.   Yes [provider]  levothyroxine (SYNTHROID) 137 MCG tablet Take 137 mcg  by mouth daily before breakfast. 05/07/21  Yes [provider]  lidocaine (LIDODERM) 5 % Place 1 patch onto the skin daily. Apply to right shoulder and bilateral knees. Remove & Discard patch within 12 hours.   Yes [provider]  melatonin 5 MG TABS Take 5-10 mg by mouth at bedtime.   Yes [provider]  Multiple Vitamins-Minerals (DECUBI-VITE) CAPS Take 1 capsule by mouth daily.   Yes [provider]   pantoprazole (PROTONIX) 40 MG tablet Take 40 mg by mouth daily before breakfast. 06/05/21  Yes [provider]  Pollen Extracts (PROSTAT PO) Take 30 mLs by mouth in the morning and at bedtime.   Yes [provider]  RESTASIS 0.05 % ophthalmic emulsion Place 1 drop into both eyes 2 (two) times daily as needed (dryness). 06/09/19  Yes [provider]  rosuvastatin (CRESTOR) 10 MG tablet Take 1 tablet (10 mg total) by mouth daily. 03/19/21  Yes Supple, Megan E, RPH-CPP  tiaGABine (GABITRIL) 4 MG tablet Take 2 tablets (8 mg total) by mouth 2 (two) times daily. 07/03/21  Yes Thurnell Lose, MD  torsemide (DEMADEX) 20 MG tablet Take 1 tablet (20 mg total) by mouth as needed (swelling). Patient taking differently: Take 20 mg by mouth daily as needed (swelling). 05/04/21  Yes Troy Sine, MD  traMADol (ULTRAM) 50 MG tablet Take 50 mg by mouth every 6 (six) hours as needed (pain).   Yes [provider]    Scheduled Meds:  amLODipine  5 mg Oral Daily   Chlorhexidine Gluconate Cloth  6 each Topical Daily   clonazePAM  0.5 mg Oral BID   levothyroxine  137 mcg Oral QAC breakfast   melatonin  5-10 mg Oral QHS   pantoprazole (PROTONIX) IV  40 mg Intravenous Q12H   rosuvastatin  10 mg Oral Daily   sodium chloride flush  3 mL Intravenous Q12H   tiaGABine  8 mg Oral BID   Continuous Infusions:  cefTRIAXone (ROCEPHIN)  IV     lactated ringers 100 mL/hr at 07/30/21 0727   PRN Meds:.acetaminophen **OR** acetaminophen, cycloSPORINE, Gerhardt's butt cream, hydrALAZINE, hydrOXYzine, morphine injection, nystatin, ondansetron **OR** ondansetron (ZOFRAN) IV, traMADol  Allergies as of 07/29/2021 - Review Complete 07/29/2021  Allergen Reaction Noted   Hydrochlorothiazide Other (See Comments) 06/15/2021   Lexapro [escitalopram] Other (See Comments) 06/15/2021   Lisinopril Swelling and Other (See Comments) 06/15/2021   Silodosin Diarrhea 05/10/2021   Tamsulosin Other (See  Comments) 05/10/2021    Family History  Problem Relation Age of Onset   Hypertension Mother    Cancer Sister    Aneurysm Sister     Social History   Socioeconomic History   Marital status: Married    Spouse name: Not on file   Number of children: Not on file   Years of education: Not on file   Highest education level: Not on file  Occupational History   Not on file  Tobacco Use   Smoking status: Former    Types: Cigars   Smokeless tobacco: Never  Vaping Use   Vaping Use: Never used  Substance and Sexual Activity   Alcohol use: No    Alcohol/week: 0.0 standard drinks   Drug use: No   Sexual activity: Not on file  Other Topics Concern   Not on file  Social History Narrative   Right handed   Social Determinants of Health   Financial Resource Strain: Not on file  Food Insecurity: Not on file  Transportation Needs: Not on file  Physical Activity: Not on file  Stress: Not on file  Social Connections: Not on file  Intimate Partner Violence: Not on file    Review of Systems: All negative except as stated above in HPI.  Physical Exam: Vital signs: Vitals:   07/30/21 0455 07/30/21 0802  BP:  124/66  Pulse: 80 84  Resp: (!) 21 14  Temp:  98 F (36.7 C)  SpO2: 97%    Last BM Date : 07/29/21 General:   Obese patient, resting comfortably, not in acute distress Lungs: Visible respiratory distress Heart:  Regular rate and rhythm; no murmurs, clicks, rubs,  or gallops. Abdomen: Mild central distention but abdomen is soft, nontender, bowel sounds present.  No peritoneal signs Rectal:  Deferred  GI:  Lab Results: Recent Labs    07/29/21 1221 07/30/21 0505  WBC 11.1* 10.2  HGB 11.7* 10.4*  HCT 37.7* 31.2*  PLT 263 229   BMET Recent Labs    07/29/21 1221 07/30/21 0505  NA 136 134*  K 4.1 4.2  CL 100 99  CO2 26 26  GLUCOSE 145* 115*  BUN 24* 34*  CREATININE 1.38* 1.62*  CALCIUM 9.7 9.2   LFT Recent Labs    07/29/21 1221  PROT 6.4*  ALBUMIN  2.8*  AST 20  ALT 14  ALKPHOS 84  BILITOT 1.6*   PT/INR No results for input(s): LABPROT, INR in the last 72 hours.   Studies/Results: CT ABDOMEN PELVIS W CONTRAST  Result Date: 07/29/2021 CLINICAL DATA:  Epigastric abdominal pain. Coffee-ground emesis yesterday and last night. History of ulcer disease. EXAM: CT ABDOMEN AND PELVIS WITH CONTRAST TECHNIQUE: Multidetector CT imaging of the abdomen and pelvis was performed using the standard protocol following bolus administration of intravenous contrast. RADIATION DOSE REDUCTION: This exam was performed according to the departmental dose-optimization program which includes automated exposure control, adjustment of the mA and/or kV according to patient size and/or use of iterative reconstruction technique. CONTRAST:  133mL OMNIPAQUE IOHEXOL 300 MG/ML  SOLN COMPARISON:  02/17/2004 FINDINGS: Lower chest: Lung bases are clear except for what probably represents mild chronic scarring. The heart is enlarged. No pleural or pericardial fluid. Pacemaker leads in place. Hepatobiliary: Liver parenchyma is normal. Numerous calcified gallstones dependent in the gallbladder. Largest stone measures approximately 12 mm in diameter. No CT evidence of cholecystitis or obstruction. Pancreas: Normal Spleen: Normal Adrenals/Urinary Tract: Adrenal glands are normal. Some renal atrophy. Bilateral renal cysts without evidence of stone, mass or obstruction. Foley catheter in the bladder. Stomach/Bowel: The stomach is full of fluid and some air. No visible abnormality of the duodenum. No evidence of small-bowel obstruction. Normal appendix. Normal amount of fecal matter and air within the colon. Diverticulosis of the left colon without evidence of diverticulitis. Large amount of fecal matter within the rectosigmoid. Vascular/Lymphatic: Aorta shows atherosclerotic change but no aneurysm. IVC is normal. No adenopathy. Reproductive: Normal Other: No free fluid or air. Musculoskeletal:  Ordinary lumbar degenerative changes are present. IMPRESSION: The stomach is full of fluid with some air. No evidence of gastric outlet obstruction. No visible ulcer disease by CT, which is not particularly sensitive. No small bowel obstruction. Fecal matter and air within the colon, with a large amount of stool in the rectosigmoid region. Diverticulosis without evidence of diverticulitis. Cholelithiasis without CT evidence of cholecystitis or obstruction. Aortic Atherosclerosis (ICD10-I70.0). Electronically Signed   By: Nelson Chimes M.D.   On: 07/29/2021 15:05    Impression/Plan: -Coffee-ground emesis in setting  of anticoagulation use.  EGD last month showed gastritis and few tiny duodenal bulb ulcers. -Dysphagia.  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  EGD with Botox injection last month by Dr. Watt Climes without any improvement in symptoms. -Abnormal CT scan showing distention of the stomach without any gastric outlet obstruction. -Atrial fibrillation.  Last dose of Eliquis yesterday.  Recommendations --------------------------- -Okay to have clear liquid diet today -Plan for EGD tomorrow morning -Monitor H&H.  Continue PPI. -Discussed with hospitalist  Risks (bleeding, infection, bowel perforation that could require surgery, sedation-related changes in cardiopulmonary systems), benefits (identification and possible treatment of source of symptoms, exclusion of certain causes of symptoms), and alternatives (watchful waiting, radiographic imaging studies, empiric medical treatment)  were explained to patient/family in detail and patient wishes to proceed.     LOS: 1 day   Otis Brace  MD, FACP 07/30/2021, 10:22 AM  Contact #  [phone]

## 2021-07-30 NOTE — Care Management Obs Status (Signed)
[place] [facility] [place] NOTIFICATION   Patient Details  Name: [name] MRN: [id number] Date of Birth: November 27, 1940   Medicare Observation Status Notification Given:  Yes    Zenon Mayo, RN 07/30/2021, 10:49 AM

## 2021-07-30 NOTE — Care Management CC44 (Signed)
Condition Code 44 Documentation Completed  Patient Details  Name: [name] MRN: [id number] Date of Birth: Jan 03, 1941   Condition Code 44 given:  Yes Patient signature on Condition Code 44 notice:  Yes Documentation of 2 MD's agreement:  Yes Code 44 added to claim:  Yes    Zenon Mayo, RN 07/30/2021, 10:49 AM

## 2021-07-30 NOTE — NC FL2 (Signed)
[place] MEDICAID FL2 LEVEL OF CARE SCREENING TOOL     IDENTIFICATION  Patient Name: [name] Birthdate: 04-21-1941 Sex: male Admission Date (Current Location): 07/29/2021  [facility] and [place] Number:  Herbalist and Address:  [facility] [place] [place] [address] [place], [place] [address]      Provider Number: [id number]  Attending Physician Name and Address:  Elmarie Shiley, MD  Relative Name and Phone Number:  Helbig,Carolyn (Spouse)   [phone]    Current Level of Care: Hospital Recommended Level of Care: [place] Prior Approval Number:    Date Approved/Denied:   PASRR Number: [id number]  Discharge Plan: SNF    Current Diagnoses: Patient Active Problem List   Diagnosis Date Noted   Upper GI bleeding 07/29/2021   Constipation 07/29/2021   DNR (do not resuscitate) 07/29/2021   Weight loss 07/15/2021   Palliative care encounter 07/12/2021   Pressure injury of skin 07/12/2021   Panic attacks 07/12/2021   Anxiety disorder 32/99/2426   Acute metabolic encephalopathy 83/41/9622   Generalized weakness 06/28/2021   Severe sepsis due to left lower lobe pneumonia 06/28/2021   Left lower lobe pneumonia 06/28/2021   Lactic acidosis 06/28/2021   AKI on CKD stage IIIb. 06/28/2021   Hypercalcemia 06/28/2021   Chronic kidney disease, stage 3b ([place]) 06/28/2021   HTN (hypertension) 06/28/2021   Chronic HFrEF (heart failure with reduced ejection fraction) (EF 45-50% by echo on 03/23/2021) 06/28/2021   Hypomagnesemia 06/28/2021   OSA (obstructive sleep apnea) 06/28/2021   Achalasia-s/p EGD on 06/05/2021-injected with borderlining toxin. 06/28/2021   ?  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   Paroxysmal atrial fibrillation ([place]) 06/24/2019   Secondary hypercoagulable state ([place]) 06/24/2019   Heart block AV second  degree 11/24/2017    Orientation RESPIRATION BLADDER Height & Weight     Self, Place, Time  Normal Continent, External catheter Weight: 275 lb 2.2 oz (124.8 kg) Height:  6' (182.9 cm) (per facility record)  BEHAVIORAL SYMPTOMS/MOOD NEUROLOGICAL BOWEL NUTRITION STATUS      Continent Diet (See DC Summary)  AMBULATORY STATUS COMMUNICATION OF NEEDS Skin     Verbally Normal                       Personal Care Assistance Level of Assistance  Bathing, Feeding, Dressing           Functional Limitations Info  Sight, Speech, Hearing Sight Info: Adequate Hearing Info: Adequate Speech Info: Adequate    SPECIAL CARE FACTORS FREQUENCY                       Contractures Contractures Info: Not present    Additional Factors Info  Code Status, Allergies Code Status Info: DNR Allergies Info: Hydrochlorothiazide   Lexapro (Escitalopram)   Lisinopril   Silodosin   Tamsulosin           Current Medications (07/30/2021):  This is the current hospital active medication list Current Facility-Administered Medications  Medication Dose Route Frequency Provider Last Rate Last Admin   acetaminophen (TYLENOL) tablet 650 mg  650 mg Oral Q6H PRN Karmen Bongo, MD       Or   acetaminophen (TYLENOL) suppository 650 mg  650 mg Rectal Q6H PRN Karmen Bongo, MD       amLODipine (NORVASC)  tablet 5 mg  5 mg Oral Daily Karmen Bongo, MD   5 mg at 07/30/21 0825   cefTRIAXone (ROCEPHIN) 1 g in sodium chloride 0.9 % 100 mL IVPB  1 g Intravenous Q24H Regalado, Belkys A, MD 200 mL/hr at 07/30/21 1215 1 g at 07/30/21 1215   Chlorhexidine Gluconate Cloth 2 % PADS 6 each  6 each Topical Daily Regalado, Belkys A, MD   6 each at 07/30/21 1050   clonazePAM (KLONOPIN) disintegrating tablet 0.5 mg  0.5 mg Oral BID Karmen Bongo, MD   0.5 mg at 07/30/21 0825   cycloSPORINE (RESTASIS) 0.05 % ophthalmic emulsion 1 drop  1 drop Both Eyes BID PRN Karmen Bongo, MD       Gerhardt's butt cream   Topical  PRN Regalado, Belkys A, MD       hydrALAZINE (APRESOLINE) injection 5 mg  5 mg Intravenous Q4H PRN Karmen Bongo, MD       hydrOXYzine (ATARAX) tablet 25 mg  25 mg Oral Q8H PRN Karmen Bongo, MD   25 mg at 07/30/21 1217   lactated ringers infusion   Intravenous Continuous Karmen Bongo, MD 100 mL/hr at 07/30/21 0727 New Bag at 07/30/21 0727   levothyroxine (SYNTHROID) tablet 137 mcg  137 mcg Oral QAC breakfast Karmen Bongo, MD   137 mcg at 07/30/21 0511   melatonin tablet 5-10 mg  5-10 mg Oral Ivery Quale, MD   5 mg at 07/29/21 2247   morphine (PF) 2 MG/ML injection 2 mg  2 mg Intravenous Q2H PRN Karmen Bongo, MD       nystatin (MYCOSTATIN/NYSTOP) topical powder   Topical PRN Regalado, Belkys A, MD       ondansetron (ZOFRAN) tablet 4 mg  4 mg Oral Q6H PRN Karmen Bongo, MD       Or   ondansetron [facility]) injection 4 mg  4 mg Intravenous Q6H PRN Karmen Bongo, MD   4 mg at 07/30/21 1215   pantoprazole (PROTONIX) injection 40 mg  40 mg Intravenous Lillia Mountain, MD   40 mg at 07/30/21 6812   rosuvastatin (CRESTOR) tablet 10 mg  10 mg Oral Daily Karmen Bongo, MD   10 mg at 07/30/21 0825   sodium chloride flush (NS) 0.9 % injection 3 mL  3 mL Intravenous Lillia Mountain, MD   3 mL at 07/30/21 0826   tiaGABine (GABITRIL) tablet 8 mg  8 mg Oral BID Karmen Bongo, MD   8 mg at 07/30/21 7517   traMADol (ULTRAM) tablet 50 mg  50 mg Oral Q6H PRN Karmen Bongo, MD   50 mg at 07/30/21 1444     Discharge Medications: Please see discharge summary for a list of discharge medications.  Relevant Imaging Results:  Relevant Lab Results:   Additional Information SS#: [id number]  Reece Agar, LCSWA

## 2021-07-30 NOTE — Plan of Care (Signed)
  Problem: Clinical Measurements: Goal: Respiratory complications will improve Outcome: Progressing   Problem: Coping: Goal: Level of anxiety will decrease Outcome: Progressing   Problem: Safety: Goal: Ability to remain free from injury will improve Outcome: Progressing   

## 2021-07-30 NOTE — Consult Note (Addendum)
WOC Nurse Consult Note: Reason for Consult:Unstageable pressure injury to sacrum present on admission.  Wound type:pressure Pressure Injury POA: Yes Measurement: 2 cm x 2 cm fibrin to wound bed Wound bed:100% thin yellow fibrin Drainage (amount, consistency, odor) scant weeping Periwound:intact Dressing procedure/placement/frequency:Cleanse sacral wound with NS and pat dry. Apply calcium alginate to wound bed([facility] # [id number]) and cover with silicone foam dressing  Change every three days and PRN soilage.  Mattress with low air loss feature has been requested.  Interdry to groin and skin folds:  Measure and cut length of InterDry to fit in skin folds that have skin breakdown Tuck InterDry fabric into skin folds in a single layer, allow for 2 inches of overhang from skin edges to allow for wicking to occur May remove to bathe; dry area thoroughly and then tuck into affected areas again Do not apply any creams or ointments when using InterDry DO NOT THROW AWAY FOR 5 DAYS unless soiled with stool DO NOT [facility] product, this will inactivate the silver in the material  New sheet of Interdry should be applied after 5 days of use if patient continues to have skin breakdown    Will not follow at this time.  Please re-consult if needed.  Domenic Moras MSN, RN, FNP-BC [facility]ON Wound, Ostomy, Continence Nurse Pager [phone]

## 2021-07-30 NOTE — Consult Note (Signed)
Referring Provider:  [place] Primary Care Physician:  [name], MD Primary Gastroenterologist:  Dr. Watt Climes  Reason for Consultation: Coffee-ground emesis  HPI: [name] is a 81 y.o. male with past medical history of atrial fibrillation on Eliquis, history of CHF, history of pacemaker placement, history of Parkinson's disease presented to the hospital with coffee-ground emesis.  Patient with history of dysphagia with abnormal motility.  Underwent EGD by Dr. Watt Climes on June 05, 2021 with Botox injection.  He was also found to have gastritis and few tiny duodenal bulb ulcers.  Hemoglobin dropped to 10.4.  Hemoglobin was normal at 13.3 earlier this month.  Normal LFTs.  Mild elevated lipase at 52.  CT abdomen pelvis with contrast yesterday showed no acute changes with fluid-filled stomach without evidence of gastric outlet obstruction.  Also showed constipation and gallstones.  Patient seen and examined at bedside.  Patient's wife at bedside.  Patient lives in skilled nursing facility.  Had 1 episode of coffee-ground emesis yesterday.  Denies any blood in the stool or black stool.  Mild abdominal discomfort.  No improvement in dysphagia with Botox injection last month according to patient.    Past Medical History:  Diagnosis Date   CKD (chronic kidney disease)    DM type 2 (diabetes mellitus, type 2) (HCC)    ED (erectile dysfunction)    Fatty liver    GERD (gastroesophageal reflux disease)    Gout    H/O adenomatous polyp of colon    HLD (hyperlipidemia)    HTN (hypertension)    Hypothyroid    Panic attacks    Parkinson disease ([place])    Psoriasis     Past Surgical History:  Procedure Laterality Date   BOTOX INJECTION  06/05/2021   Procedure: BOTOX INJECTION;  Surgeon: Clarene Essex, MD;  Location: [facility];  Service: Endoscopy;;   ESOPHAGOGASTRODUODENOSCOPY (EGD) WITH PROPOFOL N/A 06/05/2021   Procedure: ESOPHAGOGASTRODUODENOSCOPY (EGD) WITH PROPOFOL;  Surgeon: Clarene Essex,  MD;  Location: [facility];  Service: Endoscopy;  Laterality: N/A;  POSSIBLE DIL/POSSIBLE BOTOX   PACEMAKER IMPLANT N/A 11/24/2017   Procedure: PACEMAKER IMPLANT;  Surgeon: Deboraha Sprang, MD;  Location: [facility];  Service: Cardiovascular;  Laterality: N/A;    Prior to Admission medications   Medication Sig Start Date End Date Taking? Authorizing Provider  acetaminophen (TYLENOL) 500 MG tablet Take 1,000 mg by mouth every 6 (six) hours as needed for headache or mild pain.   Yes [provider]  ALPRAZolam (XANAX) 0.5 MG tablet Take 1 tablet (0.5 mg total) by mouth 2 (two) times daily as needed for anxiety. 07/03/21  Yes Thurnell Lose, MD  amLODipine (NORVASC) 5 MG tablet Take 1 tablet (5 mg total) by mouth daily. 07/04/21  Yes Thurnell Lose, MD  apixaban (ELIQUIS) 5 MG TABS tablet Take 1 tablet (5 mg total) by mouth 2 (two) times daily. 03/19/21  Yes Supple, Megan E, RPH-CPP  clonazePAM (KLONOPIN) 0.5 MG disintegrating tablet Take 1 tablet (0.5 mg total) by mouth 2 (two) times daily. 07/03/21  Yes Thurnell Lose, MD  diphenhydrAMINE-zinc acetate (BENADRYL) cream Apply topically 2 (two) times daily as needed for itching. Patient taking differently: Apply 1 application topically 2 (two) times daily as needed for itching. 07/03/21  Yes Thurnell Lose, MD  hydrOXYzine (ATARAX) 25 MG tablet Take 25 mg by mouth every 8 (eight) hours as needed for itching.   Yes [provider]  levothyroxine (SYNTHROID) 137 MCG tablet Take 137 mcg  by mouth daily before breakfast. 05/07/21  Yes [provider]  lidocaine (LIDODERM) 5 % Place 1 patch onto the skin daily. Apply to right shoulder and bilateral knees. Remove & Discard patch within 12 hours.   Yes [provider]  melatonin 5 MG TABS Take 5-10 mg by mouth at bedtime.   Yes [provider]  Multiple Vitamins-Minerals (DECUBI-VITE) CAPS Take 1 capsule by mouth daily.   Yes [provider]   pantoprazole (PROTONIX) 40 MG tablet Take 40 mg by mouth daily before breakfast. 06/05/21  Yes [provider]  Pollen Extracts (PROSTAT PO) Take 30 mLs by mouth in the morning and at bedtime.   Yes [provider]  RESTASIS 0.05 % ophthalmic emulsion Place 1 drop into both eyes 2 (two) times daily as needed (dryness). 06/09/19  Yes [provider]  rosuvastatin (CRESTOR) 10 MG tablet Take 1 tablet (10 mg total) by mouth daily. 03/19/21  Yes Supple, Megan E, RPH-CPP  tiaGABine (GABITRIL) 4 MG tablet Take 2 tablets (8 mg total) by mouth 2 (two) times daily. 07/03/21  Yes Thurnell Lose, MD  torsemide (DEMADEX) 20 MG tablet Take 1 tablet (20 mg total) by mouth as needed (swelling). Patient taking differently: Take 20 mg by mouth daily as needed (swelling). 05/04/21  Yes [place] Sine, MD  traMADol (ULTRAM) 50 MG tablet Take 50 mg by mouth every 6 (six) hours as needed (pain).   Yes [provider]    Scheduled Meds:  amLODipine  5 mg Oral Daily   Chlorhexidine Gluconate Cloth  6 each Topical Daily   clonazePAM  0.5 mg Oral BID   levothyroxine  137 mcg Oral QAC breakfast   melatonin  5-10 mg Oral QHS   pantoprazole (PROTONIX) IV  40 mg Intravenous Q12H   rosuvastatin  10 mg Oral Daily   sodium chloride flush  3 mL Intravenous Q12H   tiaGABine  8 mg Oral BID   Continuous Infusions:  cefTRIAXone (ROCEPHIN)  IV     lactated ringers 100 mL/hr at 07/30/21 0727   PRN Meds:.acetaminophen **OR** acetaminophen, cycloSPORINE, Gerhardt's butt cream, hydrALAZINE, hydrOXYzine, morphine injection, nystatin, ondansetron **OR** ondansetron (ZOFRAN) IV, traMADol  Allergies as of 07/29/2021 - Review Complete 07/29/2021  Allergen Reaction Noted   Hydrochlorothiazide Other (See Comments) 06/15/2021   Lexapro [escitalopram] Other (See Comments) 06/15/2021   Lisinopril Swelling and Other (See Comments) 06/15/2021   Silodosin Diarrhea 05/10/2021   Tamsulosin Other (See  Comments) 05/10/2021    Family History  Problem Relation Age of Onset   Hypertension Mother    Cancer Sister    Aneurysm Sister     Social History   Socioeconomic History   Marital status: Married    Spouse name: Not on file   Number of children: Not on file   Years of education: Not on file   Highest education level: Not on file  Occupational History   Not on file  Tobacco Use   Smoking status: Former    Types: Cigars   Smokeless tobacco: Never  Vaping Use   Vaping Use: Never used  Substance and Sexual Activity   Alcohol use: No    Alcohol/week: 0.0 standard drinks   Drug use: No   Sexual activity: Not on file  Other Topics Concern   Not on file  Social History Narrative   Right handed   Social Determinants of Health   Financial Resource Strain: Not on file  Food Insecurity: Not on file  Transportation Needs: Not on file  Physical Activity: Not on file  Stress: Not on file  Social Connections: Not on file  Intimate Partner Violence: Not on file    Review of Systems: All negative except as stated above in HPI.  Physical Exam: Vital signs: Vitals:   07/30/21 0455 07/30/21 0802  BP:  124/66  Pulse: 80 84  Resp: (!) 21 14  Temp:  98 F (36.7 C)  SpO2: 97%    Last BM Date : 07/29/21 General:   Obese patient, resting comfortably, not in acute distress Lungs: Visible respiratory distress Heart:  Regular rate and rhythm; no murmurs, clicks, rubs,  or gallops. Abdomen: Mild central distention but abdomen is soft, nontender, bowel sounds present.  No peritoneal signs Rectal:  Deferred  GI:  Lab Results: Recent Labs    07/29/21 1221 07/30/21 0505  WBC 11.1* 10.2  HGB 11.7* 10.4*  HCT 37.7* 31.2*  PLT 263 229   BMET Recent Labs    07/29/21 1221 07/30/21 0505  NA 136 134*  K 4.1 4.2  CL 100 99  CO2 26 26  GLUCOSE 145* 115*  BUN 24* 34*  CREATININE 1.38* 1.62*  CALCIUM 9.7 9.2   LFT Recent Labs    07/29/21 1221  PROT 6.4*  ALBUMIN  2.8*  AST 20  ALT 14  ALKPHOS 84  BILITOT 1.6*   PT/INR No results for input(s): LABPROT, INR in the last 72 hours.   Studies/Results: CT ABDOMEN PELVIS W CONTRAST  Result Date: 07/29/2021 CLINICAL DATA:  Epigastric abdominal pain. Coffee-ground emesis yesterday and last night. History of ulcer disease. EXAM: CT ABDOMEN AND PELVIS WITH CONTRAST TECHNIQUE: Multidetector CT imaging of the abdomen and pelvis was performed using the standard protocol following bolus administration of intravenous contrast. RADIATION DOSE REDUCTION: This exam was performed according to the departmental dose-optimization program which includes automated exposure control, adjustment of the mA and/or kV according to patient size and/or use of iterative reconstruction technique. CONTRAST:  137mL OMNIPAQUE IOHEXOL 300 MG/ML  SOLN COMPARISON:  02/17/2004 FINDINGS: Lower chest: Lung bases are clear except for what probably represents mild chronic scarring. The heart is enlarged. No pleural or pericardial fluid. Pacemaker leads in place. Hepatobiliary: Liver parenchyma is normal. Numerous calcified gallstones dependent in the gallbladder. Largest stone measures approximately 12 mm in diameter. No CT evidence of cholecystitis or obstruction. Pancreas: Normal Spleen: Normal Adrenals/Urinary Tract: Adrenal glands are normal. Some renal atrophy. Bilateral renal cysts without evidence of stone, mass or obstruction. Foley catheter in the bladder. Stomach/Bowel: The stomach is full of fluid and some air. No visible abnormality of the duodenum. No evidence of small-bowel obstruction. Normal appendix. Normal amount of fecal matter and air within the colon. Diverticulosis of the left colon without evidence of diverticulitis. Large amount of fecal matter within the rectosigmoid. Vascular/Lymphatic: Aorta shows atherosclerotic change but no aneurysm. IVC is normal. No adenopathy. Reproductive: Normal Other: No free fluid or air. Musculoskeletal:  Ordinary lumbar degenerative changes are present. IMPRESSION: The stomach is full of fluid with some air. No evidence of gastric outlet obstruction. No visible ulcer disease by CT, which is not particularly sensitive. No small bowel obstruction. Fecal matter and air within the colon, with a large amount of stool in the rectosigmoid region. Diverticulosis without evidence of diverticulitis. Cholelithiasis without CT evidence of cholecystitis or obstruction. Aortic Atherosclerosis (ICD10-I70.0). Electronically Signed   By: Nelson Chimes M.D.   On: 07/29/2021 15:05    Impression/Plan: -Coffee-ground emesis in setting  of anticoagulation use.  EGD last month showed gastritis and few tiny duodenal bulb ulcers. -Dysphagia.  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  EGD with Botox injection last month by Dr. Watt Climes without any improvement in symptoms. -Abnormal CT scan showing distention of the stomach without any gastric outlet obstruction. -Atrial fibrillation.  Last dose of Eliquis yesterday.  Recommendations --------------------------- -Okay to have clear liquid diet today -Plan for EGD tomorrow morning -Monitor H&H.  Continue PPI. -Discussed with hospitalist  Risks (bleeding, infection, bowel perforation that could require surgery, sedation-related changes in cardiopulmonary systems), benefits (identification and possible treatment of source of symptoms, exclusion of certain causes of symptoms), and alternatives (watchful waiting, radiographic imaging studies, empiric medical treatment)  were explained to patient/family in detail and patient wishes to proceed.     LOS: 1 day   Otis Brace  MD, FACP 07/30/2021, 10:22 AM  Contact #  [phone]

## 2021-07-30 NOTE — TOC Initial Note (Signed)
Transition of Care [facility]) - Initial/Assessment Note    Patient Details  Name: [name] MRN: [id number] Date of Birth: 1940-10-06  Transition of Care [facility] CM/SW Contact:    Tresa Endo Phone Number: 07/30/2021, 4:11 PM  Clinical Narrative:                 Pt was recently at [facility] for short term rehab but has not yet had a follow up PT evaluation. CSW contacted pt to inquire about DC plan, spoke with pt spouse who stated pt is not wanting to go back to SNF bc he does not like the SNF he was in. CSW agreed to fax pt out and see if there are any other facilities that would offer a bed. CSW has faxed out and will continue to follow for DC planning. Pt spouse shared that he is asking to DC home but may change his mind if he is offered a new facility.  Expected Discharge Plan: Skilled Nursing Facility Barriers to Discharge: Continued Medical Work up   Patient Goals and CMS Choice Patient states their goals for this hospitalization and ongoing recovery are:: Continued Rehab CMS Medicare.gov Compare Post Acute Care list provided to:: Patient Choice offered to / list presented to : Patient  Expected Discharge Plan and Services Expected Discharge Plan: [place] In-house Referral: Clinical Social Work   Post Acute Care Choice: [place] Living arrangements for the past 2 months: [place]                                      Prior Living Arrangements/Services Living arrangements for the past 2 months: Single Family Home Lives with:: Self Patient language and need for interpreter reviewed:: Yes Do you feel safe going back to the place where you live?: Yes      Need for Family Participation in Patient Care: Yes (Comment) Care giver support system in place?: Yes (comment)   Criminal Activity/Legal Involvement Pertinent to Current Situation/Hospitalization: No - Comment as needed  Activities of Daily Living       Permission Sought/Granted Permission sought to share information with : Family Supports, Chartered certified accountant granted to share information with : Yes, Verbal Permission Granted  Share Information with NAME: Mcdougall,Carolyn (Spouse)   [phone]  Permission granted to share info w AGENCY: SNF  Permission granted to share info w Relationship: Tatsch,Carolyn (Spouse)   [phone]  Permission granted to share info w Contact Information: Abruzzese,Carolyn (Spouse)   [phone]  Emotional Assessment Appearance:: Appears stated age Attitude/Demeanor/Rapport: Unable to Assess Affect (typically observed): Unable to Assess Orientation: : Oriented to Self, Oriented to Place, Oriented to  Time Alcohol / Substance Use: Not Applicable Psych Involvement: No (comment)  Admission diagnosis:  Upper GI bleeding [K92.2] Patient Active Problem List   Diagnosis Date Noted   Upper GI bleeding 07/29/2021   Constipation 07/29/2021   DNR (do not resuscitate) 07/29/2021   Weight loss 07/15/2021   Palliative care encounter 07/12/2021   Pressure injury of skin 07/12/2021   Panic attacks 07/12/2021   Anxiety disorder 35/36/1443   Acute metabolic encephalopathy 15/40/0867   Generalized weakness 06/28/2021   Severe sepsis due to left lower lobe pneumonia 06/28/2021   Left lower lobe pneumonia 06/28/2021   Lactic acidosis 06/28/2021   AKI on CKD stage IIIb. 06/28/2021   Hypercalcemia 06/28/2021   Chronic kidney disease, stage  3b ([place]) 06/28/2021   HTN (hypertension) 06/28/2021   Chronic HFrEF (heart failure with reduced ejection fraction) (EF 45-50% by echo on 03/23/2021) 06/28/2021   Hypomagnesemia 06/28/2021   OSA (obstructive sleep apnea) 06/28/2021   Achalasia-s/p EGD on 06/05/2021-injected with borderlining toxin. 06/28/2021   ?  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

## 2021-07-30 NOTE — Progress Notes (Signed)
°  Progress Note   Patient: [name] LYY:[id number] DOB: 08-21-1940 DOA: 07/29/2021     1 DOS: the patient was seen and examined on 07/30/2021   Brief hospital course: 81 year old with past medical history significant for diabetes, CKD, hypertension, hyperlipidemia, chronic systolic heart failure, pacemaker, A-fib on Eliquis, hypothyroidism, Parkinson's not on medication presenting with coffee-ground emesis.  He had a EGD in January with peptic ulcer disease.  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  Dysphagia 1 diet with thin liquids.  He presented with complaint of vomiting coffee-ground emesis.  He also reported constipation.  CT Scan; the stomach is full of fluid with some air.  No evidence of gastric outlet obstruction.  No visible ulcers by CT.  No small bowel obstruction.  Fecal matter and air within the colon with large amount of stool in the rectosigmoid region.  Diverticulosis without evidence of diverticulitis.  Assessment and Plan: * Upper GI bleeding- (present on admission) -Patient is presenting with hematemesis, suggestive of upper GI bleeding. 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 -Continue with PPI.  -GI planing Endoscopy 2/28 after 48 hours eliquis washout.  -Clear liquid diet.   DNR (do not resuscitate)- (present on admission) -Has paperwork at the bedside  Constipation- (present on admission) -Significant constipation appreciated on imaging -No BM. Repeat Enema.  -Start Miralax, senna.   Anxiety disorder- (present on admission) -Reportedly tapered off BZD previously but started on BID Klonopin during last hospitalization, will continue -Continue Gabitril and prn hydroxyzine. -He also takes Xanax twice daily as needed  OSA (obstructive sleep apnea)- (present on admission) -Hold BIPAP  due to vomiting.   Chronic HFrEF (heart failure with reduced ejection fraction) (EF 45-50% by echo on 03/23/2021)- (present on admission) -Has pacemaker. -Compensated. Not on lasix.   HTN (hypertension)- (present on admission) -Continue Crestor  Hypothyroidism-TSH 1.345 on 1/23- (present on admission) -Continue Synthroid  ?  Idiopathic Parkinson disease - (present on admission) -Undergoing evaluation by neurology -Not currently on Sinemet  Paroxysmal atrial fibrillation ([place])- (present on admission) -Rate controlled without medication -Has pacemaker -Eliquis last dose was 2/26., holding due to UGI bleeding        Subjective:  No further vomiting. No BM.   Physical Exam: Vitals:   07/30/21 0454 07/30/21 0455 07/30/21 0802 07/30/21 1052  BP:   124/66 (!) 113/52  Pulse:  80 84 88  Resp:  (!) 21 14 12   Temp: 98.6 F (37 C)  98 F (36.7 C) 98 F (36.7 C)  TempSrc: Oral  Oral Oral  SpO2:  97%  98%  Weight: 124.8 kg     Height:       General; NAD CVS; S 1, S 2 RRR Lung; CTA Abdomen; soft, nt Extremities; no edema.   Data Reviewed:  CBC, Bmet and CT scan reviewed.   Family Communication: Wife who was at bedside.   Disposition: Status is: Observation The patient remains OBS appropriate and will d/c before 2 midnights.        Planned Discharge Destination: Skilled nursing facility     Time spent: 45 minutes  Author: Elmarie Shiley, MD 07/30/2021 6:42 PM  For on call review [email or web address].

## 2021-07-30 NOTE — Plan of Care (Signed)
?  Problem: Coping: ?Goal: Level of anxiety will decrease ?Outcome: Progressing ?  ?Problem: Safety: ?Goal: Ability to remain free from injury will improve ?Outcome: Progressing ?  ?

## 2021-07-30 NOTE — Hospital Course (Signed)
81 year old with past medical history significant for diabetes, CKD, hypertension, hyperlipidemia, chronic systolic heart failure, pacemaker, A-fib on Eliquis, hypothyroidism, Parkinson's not on medication presenting with coffee-ground emesis.  He had a EGD in January with peptic ulcer disease.  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  Dysphagia 1 diet with thin liquids.  He presented with complaint of vomiting coffee-ground emesis.  He also reported constipation.  CT Scan; the stomach is full of fluid with some air.  No evidence of gastric outlet obstruction.  No visible ulcers by CT.  No small bowel obstruction.  Fecal matter and air within the colon with large amount of stool in the rectosigmoid region.  Diverticulosis without evidence of diverticulitis.

## 2021-07-31 ENCOUNTER — Encounter (HOSPITAL_COMMUNITY): Payer: Self-pay | Admitting: Internal Medicine

## 2021-07-31 ENCOUNTER — Observation Stay (HOSPITAL_BASED_OUTPATIENT_CLINIC_OR_DEPARTMENT_OTHER): Payer: Medicare HMO | Admitting: Anesthesiology

## 2021-07-31 ENCOUNTER — Encounter (HOSPITAL_COMMUNITY)
Admission: EM | Disposition: A | Discharge status: Skilled Nursing Facility | Payer: Self-pay | Source: Home / Self Care | Attending: Student

## 2021-07-31 ENCOUNTER — Observation Stay (HOSPITAL_COMMUNITY): Payer: Medicare HMO | Admitting: Anesthesiology

## 2021-07-31 DIAGNOSIS — I4891 Unspecified atrial fibrillation: Secondary | ICD-10-CM

## 2021-07-31 DIAGNOSIS — K2990 Gastroduodenitis, unspecified, without bleeding: Secondary | ICD-10-CM | POA: Diagnosis not present

## 2021-07-31 DIAGNOSIS — I10 Essential (primary) hypertension: Secondary | ICD-10-CM | POA: Diagnosis not present

## 2021-07-31 DIAGNOSIS — F32A Depression, unspecified: Secondary | ICD-10-CM

## 2021-07-31 DIAGNOSIS — K920 Hematemesis: Secondary | ICD-10-CM | POA: Diagnosis not present

## 2021-07-31 DIAGNOSIS — D638 Anemia in other chronic diseases classified elsewhere: Secondary | ICD-10-CM | POA: Diagnosis not present

## 2021-07-31 DIAGNOSIS — K208 Other esophagitis without bleeding: Secondary | ICD-10-CM | POA: Diagnosis not present

## 2021-07-31 DIAGNOSIS — K2970 Gastritis, unspecified, without bleeding: Secondary | ICD-10-CM | POA: Diagnosis not present

## 2021-07-31 DIAGNOSIS — E039 Hypothyroidism, unspecified: Secondary | ICD-10-CM | POA: Diagnosis not present

## 2021-07-31 DIAGNOSIS — E119 Type 2 diabetes mellitus without complications: Secondary | ICD-10-CM | POA: Diagnosis not present

## 2021-07-31 DIAGNOSIS — K2991 Gastroduodenitis, unspecified, with bleeding: Secondary | ICD-10-CM | POA: Diagnosis not present

## 2021-07-31 DIAGNOSIS — Z7984 Long term (current) use of oral hypoglycemic drugs: Secondary | ICD-10-CM | POA: Diagnosis not present

## 2021-07-31 DIAGNOSIS — K922 Gastrointestinal hemorrhage, unspecified: Secondary | ICD-10-CM | POA: Diagnosis not present

## 2021-07-31 HISTORY — PX: ESOPHAGOGASTRODUODENOSCOPY (EGD) WITH PROPOFOL: SHX5813

## 2021-07-31 LAB — BASIC METABOLIC PANEL
Anion gap: 8 (ref 5–15)
BUN: 36 mg/dL — ABNORMAL HIGH (ref 8–23)
CO2: 26 mmol/L (ref 22–32)
Calcium: 9.1 mg/dL (ref 8.9–10.3)
Chloride: 100 mmol/L (ref 98–111)
Creatinine, Ser: 1.53 mg/dL — ABNORMAL HIGH (ref 0.61–1.24)
GFR, Estimated: 46 mL/min — ABNORMAL LOW (ref >60)
Glucose, Bld: 80 mg/dL (ref 70–99)
Potassium: 4.1 mmol/L (ref 3.5–5.1)
Sodium: 134 mmol/L — ABNORMAL LOW (ref 135–145)

## 2021-07-31 LAB — CBC
HCT: 29.1 % — ABNORMAL LOW (ref 39.0–52.0)
HCT: 30.1 % — ABNORMAL LOW (ref 39.0–52.0)
Hemoglobin: 9.7 g/dL — ABNORMAL LOW (ref 13.0–17.0)
Hemoglobin: 9.5 g/dL — ABNORMAL LOW (ref 13.0–17.0)
MCH: 29.6 pg (ref 26.0–34.0)
MCH: 28.7 pg (ref 26.0–34.0)
MCHC: 33.3 g/dL (ref 30.0–36.0)
MCHC: 31.6 g/dL (ref 30.0–36.0)
MCV: 88.7 fL (ref 80.0–100.0)
MCV: 90.9 fL (ref 80.0–100.0)
Platelets: 199 10*3/uL (ref 150–400)
Platelets: 211 10*3/uL (ref 150–400)
RBC: 3.28 MIL/uL — ABNORMAL LOW (ref 4.22–5.81)
RBC: 3.31 MIL/uL — ABNORMAL LOW (ref 4.22–5.81)
RDW: 14.7 % (ref 11.5–15.5)
RDW: 14.9 % (ref 11.5–15.5)
WBC: 9.1 10*3/uL (ref 4.0–10.5)
WBC: 7.8 10*3/uL (ref 4.0–10.5)
nRBC: 0 % (ref 0.0–0.2)
nRBC: 0 % (ref 0.0–0.2)

## 2021-07-31 SURGERY — ESOPHAGOGASTRODUODENOSCOPY (EGD) WITH PROPOFOL
Anesthesia: Monitor Anesthesia Care

## 2021-07-31 MED ORDER — PHENYLEPHRINE 40 MCG/ML (10ML) SYRINGE FOR IV PUSH (FOR BLOOD PRESSURE SUPPORT)
PREFILLED_SYRINGE | INTRAVENOUS | Status: DC | PRN
  Administered 2021-07-31: 80 ug via INTRAVENOUS

## 2021-07-31 MED ORDER — SENNA 8.6 MG PO TABS: 1.0000 | ORAL_TABLET | Freq: Every day | ORAL | 0 refills | Status: AC

## 2021-07-31 MED ORDER — SUCRALFATE 1 GM/10ML PO SUSP
1.0000 g | Freq: Two times a day (BID) | ORAL | Status: DC
  Administered 2021-07-31 – 2021-08-01 (×3): 1 g via ORAL
  Filled 2021-07-31 (×3): qty 10

## 2021-07-31 MED ORDER — LIDOCAINE 2% (20 MG/ML) 5 ML SYRINGE
INTRAMUSCULAR | Status: DC | PRN
Start: 2021-07-31 — End: 2021-07-31
  Administered 2021-07-31: 60 mg via INTRAVENOUS

## 2021-07-31 MED ORDER — GUAIFENESIN ER 600 MG PO TB12
600.0000 mg | ORAL_TABLET | Freq: Two times a day (BID) | ORAL | Status: DC
  Administered 2021-07-31 – 2021-08-01 (×3): 600 mg via ORAL
  Filled 2021-07-31 (×3): qty 1

## 2021-07-31 MED ORDER — POLYETHYLENE GLYCOL 3350 17 G PO PACK: 17.0000 g | PACK | Freq: Two times a day (BID) | ORAL | 0 refills | Status: AC

## 2021-07-31 MED ORDER — PROPOFOL 500 MG/50ML IV EMUL
INTRAVENOUS | Status: DC | PRN
  Administered 2021-07-31: 75 ug/kg/min via INTRAVENOUS

## 2021-07-31 MED ORDER — APIXABAN 5 MG PO TABS
5.0000 mg | ORAL_TABLET | Freq: Two times a day (BID) | ORAL | 0 refills | Status: DC
Start: 2021-08-03 — End: 2021-10-01

## 2021-07-31 MED ORDER — SODIUM CHLORIDE 0.9 % IV SOLN: INTRAVENOUS | Status: DC

## 2021-07-31 MED ORDER — GUAIFENESIN ER 600 MG PO TB12
600.0000 mg | ORAL_TABLET | Freq: Two times a day (BID) | ORAL | 0 refills | Status: AC
Start: 2021-07-31 — End: ?

## 2021-07-31 MED ORDER — SUCRALFATE 1 GM/10ML PO SUSP: 1.0000 g | Freq: Two times a day (BID) | ORAL | 0 refills | Status: AC

## 2021-07-31 MED ORDER — CEPHALEXIN 500 MG PO CAPS
500.0000 mg | ORAL_CAPSULE | Freq: Three times a day (TID) | ORAL | 0 refills | Status: AC
Start: 2021-07-31 — End: 2021-08-05

## 2021-07-31 MED ORDER — PANTOPRAZOLE SODIUM 40 MG PO TBEC: 40.0000 mg | DELAYED_RELEASE_TABLET | Freq: Two times a day (BID) | ORAL | 3 refills | Status: DC

## 2021-07-31 MED ORDER — DULOXETINE HCL 30 MG PO CPEP
30.0000 mg | ORAL_CAPSULE | Freq: Every day | ORAL | Status: DC
  Administered 2021-07-31 – 2021-08-01 (×2): 30 mg via ORAL
  Filled 2021-07-31 (×2): qty 1

## 2021-07-31 MED ORDER — LACTATED RINGERS IV SOLN: INTRAVENOUS | Status: DC | PRN

## 2021-07-31 MED ORDER — PROPOFOL 10 MG/ML IV BOLUS
INTRAVENOUS | Status: DC | PRN
  Administered 2021-07-31: 30 mg via INTRAVENOUS
  Administered 2021-07-31: 20 mg via INTRAVENOUS

## 2021-07-31 MED ORDER — DULOXETINE HCL 30 MG PO CPEP: 30.0000 mg | ORAL_CAPSULE | Freq: Every day | ORAL | 3 refills | Status: DC

## 2021-07-31 SURGICAL SUPPLY — 15 items

## 2021-07-31 NOTE — Evaluation (Signed)
Occupational Therapy Evaluation Patient Details Name: [name] MRN: [id number] DOB: May 10, 1941 Today's Date: 07/31/2021   History of Present Illness 81 yo admitted 2/26 with GIB. PMhx: DM, CKD, HTN, HLD, CHF, PPM, Afib, hypothyroidism, Parkinson's   Clinical Impression   Prior to this admission, patient was at [facility] receiving therapy. Per family report patient was continuing to need significant assist, but had taken a few steps in the parallel bars recently. Currently, patient is max to total assist for ADLs, and max A with the Stedy in order to come into standing. Patient with pending discharge order back to [facility] therefore patient not added to OT caseload. Please re-consult if acute OT needs arise.      Recommendations for follow up therapy are one component of a multi-disciplinary discharge planning process, led by the attending physician.  Recommendations may be updated based on patient status, additional functional criteria and insurance authorization.   Follow Up Recommendations  Skilled nursing-short term rehab (<3 hours/day)    Assistance Recommended at Discharge Frequent or constant Supervision/Assistance  Patient can return home with the following Two people to help with walking and/or transfers;Two people to help with bathing/dressing/bathroom;Assistance with cooking/housework;Assistance with feeding;Direct supervision/assist for medications management;Direct supervision/assist for financial management;Assist for transportation;Help with stairs or ramp for entrance    Functional Status Assessment  Patient has had a recent decline in their functional status and demonstrates the ability to make significant improvements in function in a reasonable and predictable amount of time.  Equipment Recommendations  Other (comment) (Defer to next venue)    Recommendations for Other Services       Precautions / Restrictions Precautions Precautions: Fall Restrictions Weight  Bearing Restrictions: No      Mobility Bed Mobility Overal bed mobility: Needs Assistance Bed Mobility: Supine to Sit, Sit to Supine     Supine to sit: Mod assist, +2 for physical assistance Sit to supine: Mod assist, +2 for physical assistance   General bed mobility comments: Able to initiate minimally, heavy use of bed rails in order to pull into sitting, unable to lift BLEs back into bed at end of session without assist    Transfers Overall transfer level: Needs assistance Equipment used: Ambulation equipment used Transfers: Sit to/from Stand Sit to Stand: Max assist, +2 physical assistance, +2 safety/equipment           General transfer comment: Utilized stedy to complete sit<>stands x3, max A in order to come fully upright and engage hips, difficulty to maintain upright balance      Balance Overall balance assessment: Mild deficits observed, not formally tested                                         ADL either performed or assessed with clinical judgement   ADL Overall ADL's : Needs assistance/impaired Eating/Feeding: Minimal assistance   Grooming: Bed level;Minimal assistance   Upper Body Bathing: Minimal assistance;Bed level   Lower Body Bathing: Maximal assistance;Total assistance;Bed level   Upper Body Dressing : Minimal assistance;Bed level   Lower Body Dressing: Maximal assistance;Total assistance;Bed level   Toilet Transfer: Maximal assistance;Total assistance;+2 for physical assistance;+2 for safety/equipment Toilet Transfer Details (indicate cue type and reason): Utilized stedy to complete sit<>stands x3, max A in order to come fully upright and engage hips Toileting- Clothing Manipulation and Hygiene: Total assistance       Functional mobility during ADLs: Maximal  assistance;+2 for physical assistance;+2 for safety/equipment       Vision Baseline Vision/History: 0 No visual deficits Ability to See in Adequate Light: 0  Adequate Patient Visual Report: No change from baseline       Perception     Praxis      Pertinent Vitals/Pain Pain Assessment Pain Assessment: Faces Faces Pain Scale: Hurts even more Pain Location: knees, peri area, and generalized with movement Pain Descriptors / Indicators: Grimacing, Discomfort, Guarding Pain Intervention(s): Limited activity within patient's tolerance, Monitored during session, Patient requesting pain meds-RN notified, Repositioned     Hand Dominance     Extremity/Trunk Assessment Upper Extremity Assessment Upper Extremity Assessment: Overall WFL for tasks assessed;Generalized weakness   Lower Extremity Assessment Lower Extremity Assessment: Generalized weakness;Defer to PT evaluation   Cervical / Trunk Assessment Cervical / Trunk Assessment: Kyphotic   Communication Communication Communication: Other (comment) (Garbled speech)   Cognition Arousal/Alertness: Lethargic Behavior During Therapy: Flat affect Overall Cognitive Status: Within Functional Limits for tasks assessed                                 General Comments: At baseline, is participatory, but requires extra time to follow commands     General Comments       Exercises     Shoulder Instructions      Home Living Family/patient expects to be discharged to:: Skilled nursing facility                                 Additional Comments: From [facility] recieving rehab      Prior Functioning/Environment Prior Level of Function : Needs assist             Mobility Comments: per family patient had worked on walking a few steps with therapy at [facility] in the paralell bars ADLs Comments: needs assist        OT Problem List: Decreased strength;Decreased range of motion;Decreased activity tolerance;Impaired balance (sitting and/or standing);Decreased coordination;Decreased safety awareness;Decreased knowledge of use of DME or AE;Decreased  knowledge of precautions;Obesity;Pain;Impaired UE functional use      OT Treatment/Interventions:      OT Goals(Current goals can be found in the care plan section) Acute Rehab OT Goals Patient Stated Goal: to get out of here OT Goal Formulation: Patient unable to participate in goal setting Time For Goal Achievement: 08/14/21 Potential to Achieve Goals: Fair  OT Frequency:      Co-evaluation PT/OT/SLP Co-Evaluation/Treatment: Yes Reason for Co-Treatment: Complexity of the patient's impairments (multi-system involvement);For patient/therapist safety;To address functional/ADL transfers   OT goals addressed during session: ADL's and self-care;Proper use of Adaptive equipment and DME      AM-PAC OT "6 Clicks" Daily Activity     Outcome Measure Help from another person eating meals?: A Little Help from another person taking care of personal grooming?: A Little Help from another person toileting, which includes using toliet, bedpan, or urinal?: Total Help from another person bathing (including washing, rinsing, drying)?: A Lot Help from another person to put on and taking off regular upper body clothing?: A Lot Help from another person to put on and taking off regular lower body clothing?: Total 6 Click Score: 12   End of Session Equipment Utilized During Treatment: Other (comment) Charlaine Dalton) Nurse Communication: Mobility status;Patient requests pain meds  Activity Tolerance: Patient limited by lethargy;Patient limited by  fatigue Patient left: in bed;with call bell/phone within reach;with bed alarm set  OT Visit Diagnosis: Unsteadiness on feet (R26.81);Other abnormalities of gait and mobility (R26.89);Muscle weakness (generalized) (M62.81);Other symptoms and signs involving the nervous system (R29.898);Pain Pain - part of body:  (Generalized)                Time: 1121-1150 OT Time Calculation (min): 29 min Charges:  OT General Charges $OT Visit: 1 Visit OT Evaluation $OT Eval  Moderate Complexity: 1 Mod  Corinne Ports E. Daxton Nydam, OTR/L Acute Rehabilitation Services [phone] [place] 07/31/2021, 12:54 PM

## 2021-07-31 NOTE — Progress Notes (Signed)
PT Cancellation Note  Patient Details Name: [name] MRN: [id number] DOB: March 17, 1941   Cancelled Treatment:    Reason Eval/Treat Not Completed: Patient at procedure or test/unavailable   [name] [name] 07/31/2021, 9:08 AM Bayard Males, PT Acute Rehabilitation Services Pager: [phone] Office: [phone]

## 2021-07-31 NOTE — Anesthesia Procedure Notes (Signed)
Procedure Name: MAC Date/Time: 07/31/2021 9:30 AM Performed by: Trinna Post., CRNA Pre-anesthesia Checklist: Patient identified, Emergency Drugs available, Suction available, Patient being monitored and Timeout performed Patient Re-evaluated:Patient Re-evaluated prior to induction Oxygen Delivery Method: Nasal cannula Preoxygenation: Pre-oxygenation with 100% oxygen Induction Type: IV induction Placement Confirmation: positive ETCO2

## 2021-07-31 NOTE — TOC Progression Note (Addendum)
Transition of Care [facility]) - Progression Note    Patient Details  Name: [name] MRN: [id number] Date of Birth: May 08, 1941  Transition of Care [facility]) CM/SW Contact  Reece Agar, [place] Phone Number: 07/31/2021, 9:57 AM  Clinical Narrative:    Pt family has agreed for pt to return to [place] has started insurance. CSW will wait to hear from [place] about [place] approval for DC.   Expected Discharge Plan: [place] Barriers to Discharge: Continued Medical Work up  Expected Discharge Plan and Services Expected Discharge Plan: [place] In-house Referral: Clinical Social Work   Post Acute Care Choice: [place] Living arrangements for the past 2 months: Single Family Home                                       Social Determinants of Health (SDOH) Interventions    Readmission Risk Interventions No flowsheet data found.

## 2021-07-31 NOTE — Plan of Care (Signed)
  Problem: Clinical Measurements: Goal: Will remain free from infection Outcome: Progressing Goal: Respiratory complications will improve Outcome: Progressing   Problem: Safety: Goal: Ability to remain free from injury will improve Outcome: Progressing   

## 2021-07-31 NOTE — Anesthesia Preprocedure Evaluation (Addendum)
Anesthesia Evaluation  Patient identified by MRN, date of birth, ID band Patient awake    Reviewed: Allergy & Precautions, NPO status , Patient's Chart, lab work & pertinent test results  Airway Mallampati: IV  TM Distance: >3 FB Neck ROM: Full    Dental  (+) Edentulous Upper, Edentulous Lower   Pulmonary sleep apnea and Continuous Positive Airway Pressure Ventilation , pneumonia, former smoker,    Pulmonary exam normal breath sounds clear to auscultation       Cardiovascular hypertension, Pt. on medications + dysrhythmias Atrial Fibrillation + pacemaker  Rhythm:Regular Rate:Normal  EKG 07/29/21 Ventricular paced rhythm  Echo 1021/22 1. Left ventricular ejection fraction, by estimation, is 45 to 50%. The left ventricle has mildly decreased function. The left ventricle demonstrates global hypokinesis. Left ventricular diastolic parameters are consistent with Grade I diastolic dysfunction (impaired relaxation).  2. Right ventricular systolic function is normal. The right ventricular size is normal.  3. Left atrial size was mildly dilated.  4. Right atrial size was mildly dilated.  5. The mitral valve is normal in structure. Trivial mitral valve regurgitation. No evidence of mitral stenosis.  6. The aortic valve is tricuspid. There is mild calcification of the aortic valve. There is mild thickening of the aortic valve. Aortic valve regurgitation is not visualized. Mild aortic valve stenosis. Aortic valve mean gradient measures 10.8 mmHg. Aortic valve Vmax measures 2.21 m/s.  7. Aortic dilatation noted. There is mild dilatation of the ascending aorta, measuring 43 mm.  8. The inferior vena cava is normal in size with greater than 50% respiratory variability, suggesting right atrial pressure of 3 mmHg.   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   Neuro/Psych PSYCHIATRIC DISORDERS Anxiety Hx/o panic attacksDysarthria    GI/Hepatic Neg liver ROS, GERD   Medicated and Controlled,  Endo/Other  diabetes, Well Controlled, Type 2, Oral Hypoglycemic AgentsHypothyroidism Obesity Hyperlipidemia  Renal/GU Renal InsufficiencyRenal disease  negative genitourinary   Musculoskeletal  (+) Arthritis , Osteoarthritis,    Abdominal (+) + obese,   Peds  Hematology  (+) Blood dyscrasia, anemia ,   Anesthesia Other Findings   Reproductive/Obstetrics                            Anesthesia Physical Anesthesia Plan  ASA: 3  Anesthesia Plan: MAC   Post-op Pain Management:    Induction: Intravenous  PONV Risk Score and Plan: 1 and Treatment may vary due to age or medical condition and Propofol infusion  Airway Management Planned: Natural Airway and Simple Face Mask  Additional Equipment: None  Intra-op Plan:   Post-operative Plan:   Informed Consent: I have reviewed the patients History and Physical, chart, labs and discussed the procedure including the risks, benefits and alternatives for the proposed anesthesia with the patient or authorized representative who has indicated his/her understanding and acceptance.   Patient has DNR.  Discussed DNR with patient and Suspend DNR.     Plan Discussed with: CRNA and Anesthesiologist  Anesthesia Plan Comments:        Anesthesia Quick Evaluation

## 2021-07-31 NOTE — Transfer of Care (Signed)
Immediate Anesthesia Transfer of Care Note  Patient: [name]  Procedure(s) Performed: ESOPHAGOGASTRODUODENOSCOPY (EGD) WITH PROPOFOL  Patient Location: PACU  Anesthesia Type:MAC  Level of Consciousness: awake and drowsy  Airway & Oxygen Therapy: Patient Spontanous Breathing and Patient connected to nasal cannula oxygen  Post-op Assessment: Report given to RN and Post -op Vital signs reviewed and stable  Post vital signs: Reviewed and stable  Last Vitals:  Vitals Value Taken Time  BP 90/42 07/31/21 0953  Temp    Pulse 86 07/31/21 0955  Resp 26 07/31/21 0957  SpO2 83 % 07/31/21 0955  Vitals shown include unvalidated device data.  Last Pain:  Vitals:   07/31/21 0825  TempSrc: Temporal  PainSc:          Complications: No notable events documented.

## 2021-07-31 NOTE — Brief Op Note (Signed)
07/29/2021 - 07/31/2021  9:59 AM  PATIENT:  [name]  81 y.o. male  PRE-OPERATIVE DIAGNOSIS:  Coffee-ground emesis  POST-OPERATIVE DIAGNOSIS:  esophagitis gastritis  PROCEDURE:  Procedure(s): ESOPHAGOGASTRODUODENOSCOPY (EGD) WITH PROPOFOL (N/A)  SURGEON:  Surgeon(s) and Role:    * Brennley Curtice, MD - Primary  Finding ---------- -EGD showed LA grade D erosive esophagitis and small amount of retained food in the stomach.  Mild gastritis.  No evidence of active bleeding.  Recommendations ---------------------------- -Protonix 40 mg twice a day for 4 weeks followed by Protonix 40 mg once a day for another 4 to 8 weeks. -Recommend Carafate 1 g twice daily for 2 weeks -Start soft diet and advance as tolerated -Resume anticoagulation in 2 days if no further bleeding episodes -Avoid NSAIDs -No further inpatient GI work-up planned.  GI will sign off.  Call us back if needed. -follow up With Dr. Watt Climes in 2 months after discharge  Otis Brace MD, [place] 07/31/2021, 10:00 AM  Contact #  [phone]

## 2021-07-31 NOTE — Assessment & Plan Note (Addendum)
He denies intent to harm himself.  I called Psychiatrist Office, he was suppose to be stated on Cymbalta 30 mg daily. I have started Cymbalta. Will provide prescriptions.

## 2021-07-31 NOTE — Anesthesia Postprocedure Evaluation (Signed)
Anesthesia Post Note  Patient: [name]  Procedure(s) Performed: ESOPHAGOGASTRODUODENOSCOPY (EGD) WITH PROPOFOL     Patient location during evaluation: PACU Anesthesia Type: MAC Level of consciousness: awake and alert and oriented Pain management: pain level controlled Vital Signs Assessment: post-procedure vital signs reviewed and stable Respiratory status: spontaneous breathing, nonlabored ventilation and respiratory function stable Cardiovascular status: stable and blood pressure returned to baseline Postop Assessment: no apparent nausea or vomiting Anesthetic complications: no   No notable events documented.  Last Vitals:  Vitals:   07/31/21 0952 07/31/21 1000  BP: (!) 90/42 110/61  Pulse: 66 74  Resp: (!) 8 18  Temp: 37.1 C   SpO2: 99% 100%    Last Pain:  Vitals:   07/31/21 0952  TempSrc:   PainSc: 0-No pain                 Iva Montelongo A.

## 2021-07-31 NOTE — Evaluation (Addendum)
Physical Therapy Evaluation Patient Details Name: [name] MRN: [id number] DOB: 1941-03-27 Today's Date: 07/31/2021  History of Present Illness  81 yo admitted 2/26 with GIB. 2/28 EGD. PMhx: DM, CKD, HTN, HLD, CHF, PPM, Afib, hypothyroidism, Parkinson's  Clinical Impression  Pt pleasant and very willing to work toward increased mobility and function. Pt has been working with therapy at [facility] and states desire to be able to return home. Pt with decreased strength, ROM, functional mobility and transfers requiring Stedy and +2 assist to stand this session. Pt aware of bed level HEP as he and wife able to verbalize on arrival with encouragement to continue. Pt will benefit from acute therapy to work toward improved function and decreased caregiver burden.      Recommendations for follow up therapy are one component of a multi-disciplinary discharge planning process, led by the attending physician.  Recommendations may be updated based on patient status, additional functional criteria and insurance authorization.  Follow Up Recommendations Skilled nursing-short term rehab (<3 hours/day)    Assistance Recommended at Discharge Frequent or constant Supervision/Assistance  Patient can return home with the following  Two people to help with walking and/or transfers;A lot of help with bathing/dressing/bathroom;Direct supervision/assist for medications management;Assistance with feeding;Assist for transportation;Direct supervision/assist for financial management;Help with stairs or ramp for entrance    Equipment Recommendations None recommended by PT  Recommendations for Other Services       Functional Status Assessment Patient has had a recent decline in their functional status and/or demonstrates limited ability to make significant improvements in function in a reasonable and predictable amount of time     Precautions / Restrictions Precautions Precautions: Fall Precaution Comments: sacral wound,  contracted right hand intrinsics Restrictions Weight Bearing Restrictions: No      Mobility  Bed Mobility Overal bed mobility: Needs Assistance Bed Mobility: Rolling, Sidelying to Sit, Sit to Supine Rolling: Mod assist Sidelying to sit: Mod assist, +2 for physical assistance   Sit to supine: Mod assist, +2 for physical assistance   General bed mobility comments: max cues with physical assist to rotate hips and reach for rail to roll to side, PHysical assist to clear legs off surface and elevate trunk with initial posterior left lean with min assist to correct. Return to bed with physical assist to lift legs to surface. Total +2 to slide toward [facility]    Transfers Overall transfer level: Needs assistance   Transfers: Sit to/from Stand, Bed to chair/wheelchair/BSC Sit to Stand: Max assist, +2 physical assistance           General transfer comment: use of stedy to stand fully with max +2 assist and cues to extend hips. Pt stood 2x from bed with stedy and 1x from stedy pads. use of stedy to slide pt toward HOB at EOB. max assist for pericare in standing Transfer via Lift Equipment: Stedy  Ambulation/Gait               General Gait Details: unable  Science writer    Modified Rankin (Stroke Patients Only)       Balance Overall balance assessment: Needs assistance   Sitting balance-Leahy Scale: Poor Sitting balance - Comments: initial min assist with posterior left lean after scooting hips and cues for position pt able to progress to minguard   Standing balance support: Bilateral upper extremity supported Standing balance-Leahy Scale: Poor Standing balance comment: bil UE support and physical assist in standing  Pertinent Vitals/Pain Pain Assessment Faces Pain Scale: Hurts even more Pain Location: knees, peri area, and generalized with movement Pain Descriptors / Indicators: Grimacing,  Discomfort, Guarding Pain Intervention(s): Limited activity within patient's tolerance, Monitored during session, Repositioned    Home Living Family/patient expects to be discharged to:: Skilled nursing facility                   Additional Comments: had just transitioned to [facility] for rehab after last admission. Family plans for him to return however pt not excited about SNF    Prior Function Prior Level of Function : Needs assist       Physical Assist : Mobility (physical);ADLs (physical) Mobility (physical): Bed mobility;Transfers   Mobility Comments: per family patient had worked on walking a few steps with therapy at [employer] in the parallel bars, requiring 2 person assist for mobility ADLs Comments: needs assist     Hand Dominance        Extremity/Trunk Assessment   Upper Extremity Assessment Upper Extremity Assessment: Defer to OT evaluation    Lower Extremity Assessment Lower Extremity Assessment: Generalized weakness;RLE deficits/detail (pt able to perform grossly 2+/5 bil LE strength for knee flexion/extension and hip flexion/extension) RLE Deficits / Details: bruising and edma to Rt knee from fall    Cervical / Trunk Assessment Cervical / Trunk Assessment: Kyphotic  Communication   Communication: Expressive difficulties (garbled speech)  Cognition Arousal/Alertness: Awake/alert Behavior During Therapy: Flat affect Overall Cognitive Status: Impaired/Different from baseline Area of Impairment: Safety/judgement, Problem solving                         Safety/Judgement: Decreased awareness of deficits   Problem Solving: Slow processing, Decreased initiation, Difficulty sequencing, Requires verbal cues, Requires tactile cues General Comments: increased time, difficult to understand to fully assess cognition        General Comments      Exercises     Assessment/Plan    PT Assessment Patient needs continued PT services  PT  Problem List Decreased strength;Decreased mobility;Decreased range of motion;Decreased activity tolerance;Decreased balance;Decreased knowledge of use of DME;Pain;Obesity       PT Treatment Interventions DME instruction;Therapeutic exercise;Balance training;Functional mobility training;Therapeutic activities;Patient/family education;Neuromuscular re-education    PT Goals (Current goals can be found in the Care Plan section)  Acute Rehab PT Goals Patient Stated Goal: be able to get stronger and stand PT Goal Formulation: With patient/family Time For Goal Achievement: 08/14/21 Potential to Achieve Goals: Fair    Frequency Min 2X/week     Co-evaluation PT/OT/SLP Co-Evaluation/Treatment: Yes Reason for Co-Treatment: Complexity of the patient's impairments (multi-system involvement);For patient/therapist safety PT goals addressed during session: Mobility/safety with mobility OT goals addressed during session: ADL's and self-care;Proper use of Adaptive equipment and DME       AM-PAC PT "6 Clicks" Mobility  Outcome Measure Help needed turning from your back to your side while in a flat bed without using bedrails?: A Lot Help needed moving from lying on your back to sitting on the side of a flat bed without using bedrails?: Total Help needed moving to and from a bed to a chair (including a wheelchair)?: Total Help needed standing up from a chair using your arms (e.g., wheelchair or bedside chair)?: Total Help needed to walk in hospital room?: Total Help needed climbing 3-5 steps with a railing? : Total 6 Click Score: 7    End of Session   Activity Tolerance: Patient tolerated treatment well Patient  left: in bed;with call bell/phone within reach;with bed alarm set;with family/visitor present;Other (comment) (MD in room) Nurse Communication: Mobility status;Need for lift equipment PT Visit Diagnosis: Other abnormalities of gait and mobility (R26.89);Difficulty in walking, not elsewhere  classified (R26.2);Muscle weakness (generalized) (M62.81)    Time: 1121-1150 PT Time Calculation (min) (ACUTE ONLY): 29 min   Charges:   PT Evaluation $PT Eval Moderate Complexity: 1 Mod          Sukhdeep Wieting P, PT Acute Rehabilitation Services Pager: [phone] Office: [phone]   Sandy Salaam Timoty Bourke 07/31/2021, 1:36 PM

## 2021-07-31 NOTE — Interval H&P Note (Signed)
History and Physical Interval Note:  07/31/2021 8:45 AM  [name]  has presented today for surgery, with the diagnosis of Coffee-ground emesis.  The various methods of treatment have been discussed with the patient and family. After consideration of risks, benefits and other options for treatment, the patient has consented to  Procedure(s): ESOPHAGOGASTRODUODENOSCOPY (EGD) WITH PROPOFOL (N/A) as a surgical intervention.  The patient's history has been reviewed, patient examined, no change in status, stable for surgery.  I have reviewed the patient's chart and labs.  Questions were answered to the patient's satisfaction.     Wolf Boulay

## 2021-07-31 NOTE — Progress Notes (Signed)
OT Cancellation Note  Patient Details Name: [name] MRN: [id number] DOB: 1941/03/18   Cancelled Treatment:     Patient off unit for procedure, will follow back if time allows.   Corinne Ports E. Shariyah Eland, OTR/L Acute Rehabilitation Services [phone] [place] 07/31/2021, 8:17 AM

## 2021-07-31 NOTE — Op Note (Signed)
[facility] Patient Name: [name] Procedure Date : 07/31/2021 MRN: [id number] Attending MD: Otis Brace , MD Date of Birth: August 25, 1940 CSN: [id number] Age: 81 Admit Type: Inpatient Procedure:                Upper GI endoscopy Indications:              Hematemesis Providers:                Otis Brace, MD, Carmie End, RN, Despina Pole, Technician, Tyna Jaksch Technician,                            Dewitt Hoes, CRNA Referring MD:              Medicines:                Sedation Administered by an Anesthesia Professional Complications:            No immediate complications. Estimated Blood Loss:     Estimated blood loss was minimal. Procedure:                Pre-Anesthesia Assessment:                           - Prior to the procedure, a History and Physical                            was performed, and patient medications and                            allergies were reviewed. The patient's tolerance of                            previous anesthesia was also reviewed. The risks                            and benefits of the procedure and the sedation                            options and risks were discussed with the patient.                            All questions were answered, and informed consent                            was obtained. Prior Anticoagulants: The patient has                            taken Eliquis (apixaban), last dose was 2 days                            prior to procedure. ASA Grade Assessment: III - A  patient with severe systemic disease. After                            reviewing the risks and benefits, the patient was                            deemed in satisfactory condition to undergo the                            procedure.                           After obtaining informed consent, the endoscope was                            passed under direct vision.  Throughout the                            procedure, the patient's blood pressure, pulse, and                            oxygen saturations were monitored continuously. The                            [id number]-[id number] ([id number] Olympus endoscope was introduced                            through the mouth, and advanced to the second part                            of duodenum. The upper GI endoscopy was                            accomplished without difficulty. The patient                            tolerated the procedure well. Scope In: Scope Out: Findings:      LA Grade D (one or more mucosal breaks involving at least 75% of       esophageal circumference) esophagitis with no bleeding was found in the       mid and distal esophagus.      Scattered mild inflammation characterized by congestion (edema),       erosions and erythema was found in the gastric fundus and in the gastric       body.      A medium amount of food (residue) was found in the gastric fundus.      The cardia and gastric fundus were normal on retroflexion.      The duodenal bulb, first portion of the duodenum and second portion of       the duodenum were normal. Impression:               - LA Grade D erosive esophagitis with no bleeding.                           - Gastritis.                           -  A medium amount of food (residue) in the stomach.                           - Normal duodenal bulb, first portion of the                            duodenum and second portion of the duodenum.                           - No specimens collected. Recommendation:           - Return patient to hospital ward for ongoing care.                           - Soft diet.                           - Continue present medications.                           - Resume Eliquis (apixaban) at prior dose in 2 days. Procedure Code(s):        --- Professional ---                           [phone], Esophagogastroduodenoscopy, flexible,                             transoral; diagnostic, including collection of                            specimen(s) by brushing or washing, when performed                            (separate procedure) Diagnosis Code(s):        --- Professional ---                           K20.80, Other esophagitis without bleeding                           K29.70, Gastritis, unspecified, without bleeding                           K92.0, Hematemesis CPT copyright 2019 [employer]. All rights reserved. The codes documented in this report are preliminary and upon coder review may  be revised to meet current compliance requirements. Otis Brace, MD Otis Brace, MD 07/31/2021 9:58:46 AM Number of Addenda: 0

## 2021-07-31 NOTE — Discharge Summary (Addendum)
Physician Discharge Summary   Patient: [name] MRN: [id number] DOB: 1940-06-17  Admit date:     07/29/2021  Discharge date: 07/31/21  Discharge Physician: Elmarie Shiley   PCP: [name], MD   Recommendations at discharge:    Needs CBC and B-met.  Monitor volume status and consider torsemide as needed.  Needs Protonix 40 mg BID for 4 weeks, then daily for 4 to 8 weeks.  Needs Carafate 1 gr BID for 2 weeks.   Please follow urine culture.   Discharge Diagnoses: Principal Problem:   Upper GI bleeding Active Problems:   Paroxysmal atrial fibrillation ([place])   ?  Idiopathic Parkinson disease    Hypothyroidism-TSH 1.345 on 1/23   HTN (hypertension)   Chronic HFrEF (heart failure with reduced ejection fraction) (EF 45-50% by echo on 03/23/2021)   OSA (obstructive sleep apnea)   Anxiety disorder   Constipation   DNR (do not resuscitate)   Depression  Resolved Problems:   * No resolved hospital problems. *   Hospital Course: 81 year old with past medical history significant for diabetes, CKD, hypertension, hyperlipidemia, chronic systolic heart failure, pacemaker, A-fib on Eliquis, hypothyroidism, Parkinson's not on medication presenting with coffee-ground emesis.  He had a EGD in January with peptic ulcer disease.  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  Dysphagia 1 diet with thin liquids.  He presented with complaint of vomiting coffee-ground emesis.  He also reported constipation.  CT Scan; the stomach is full of fluid with some air.  No evidence of gastric outlet obstruction.  No visible ulcers by CT.  No small bowel obstruction.  Fecal matter and air within the colon with large amount of stool in the rectosigmoid region.  Diverticulosis without evidence of diverticulitis.  Assessment and Plan: * Upper GI bleeding- (present on admission) -Patient is presenting with hematemesis, suggestive of  upper GI bleeding. 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 -Continue with PPI.  -Underwent endoscopy 2/28: Grade D erosive esophagitis with no bleeding, Gastritis.  Patient will need PPI BID for 4 weeks, then daily for 4 to 8 weeks. Started on Carafate 1 gr BID for 2 weeks.    Depression He denies intent to harm himself.  I called Psychiatrist Office, he was suppose to be stated on Cymbalta 30 mg daily. I have started Cymbalta. Will provide prescriptions.    DNR (do not resuscitate)- (present on admission) -Has paperwork at the bedside  Constipation- (present on admission) -Significant constipation appreciated on imaging -Had BM 2/27 after enema.  -Continue with  Miralax, senna.   Anxiety disorder- (present on admission) -Reportedly tapered off BZD previously but started on BID Klonopin during last hospitalization, will continue -Continue Gabitril and prn hydroxyzine. -He also takes Xanax twice daily as needed.   OSA (obstructive sleep apnea)- (present on admission) -resume BIPAP at discharge  Chronic HFrEF (heart failure with reduced ejection fraction) (EF 45-50% by echo on 03/23/2021)- (present on admission) -Has pacemaker. -Compensated. PRN torsemide.   HTN (hypertension)- (present on admission) -Continue Crestor  Hypothyroidism-TSH 1.345 on 1/23- (present on admission) -Continue Synthroid  ?  Idiopathic Parkinson disease - (present on admission) -Undergoing evaluation by neurology -Not currently on Sinemet. Needs follow up with Neurologist.   Paroxysmal atrial fibrillation ([place])- (present on admission) -Rate controlled without medication -Has pacemaker. -Eliquis last dose was 2/26., holding  due to UGI bleeding. Per GI ok to resume Eliquis in 2 days.            Consultants: GI Procedures performed: Endoscopy Disposition: Skilled nursing  facility Diet recommendation:  Discharge Diet Orders (From admission, onward)     Start     Ordered   07/31/21 0000  Diet - low sodium heart healthy        07/31/21 1209           Cardiac diet Soft  Diet, advance as tolerated.   DISCHARGE MEDICATION: Allergies as of 07/31/2021       Reactions   Hydrochlorothiazide Other (See Comments)   "gout flares"   Lexapro [escitalopram] Other (See Comments)   Ineffective for anxiety (10/2011)   Lisinopril Swelling, Other (See Comments)   Angioedema   Silodosin Diarrhea   Tamsulosin Other (See Comments)   Visual changes        Medication List     TAKE these medications    acetaminophen 500 MG tablet Commonly known as: TYLENOL Take 1,000 mg by mouth every 6 (six) hours as needed for headache or mild pain.   ALPRAZolam 0.5 MG tablet Commonly known as: XANAX Take 1 tablet (0.5 mg total) by mouth 2 (two) times daily as needed for anxiety.   amLODipine 5 MG tablet Commonly known as: NORVASC Take 1 tablet (5 mg total) by mouth daily.   apixaban 5 MG Tabs tablet Commonly known as: ELIQUIS Take 1 tablet (5 mg total) by mouth 2 (two) times daily. Start taking on: August 03, 2021 What changed: These instructions start on August 03, 2021. If you are unsure what to do until then, ask your doctor or other care provider.   cephALEXin 500 MG capsule Commonly known as: KEFLEX Take 1 capsule (500 mg total) by mouth 3 (three) times daily for 5 days.   clonazePAM 0.5 MG disintegrating tablet Commonly known as: KLONOPIN Take 1 tablet (0.5 mg total) by mouth 2 (two) times daily.   Decubi-Vite Caps Take 1 capsule by mouth daily.   diphenhydrAMINE-zinc acetate cream Commonly known as: BENADRYL Apply topically 2 (two) times daily as needed for itching. What changed: how much to take   DULoxetine 30 MG capsule Commonly known as: CYMBALTA Take 1 capsule (30 mg total) by mouth daily.   guaiFENesin 600 MG 12 hr tablet Commonly known as:  MUCINEX Take 1 tablet (600 mg total) by mouth 2 (two) times daily.   hydrOXYzine 25 MG tablet Commonly known as: ATARAX Take 25 mg by mouth every 8 (eight) hours as needed for itching.   levothyroxine 137 MCG tablet Commonly known as: SYNTHROID Take 137 mcg by mouth daily before breakfast.   lidocaine 5 % Commonly known as: LIDODERM Place 1 patch onto the skin daily. Apply to right shoulder and bilateral knees. Remove & Discard patch within 12 hours.   melatonin 5 MG Tabs Take 5-10 mg by mouth at bedtime.   pantoprazole 40 MG tablet Commonly known as: Protonix Take 1 tablet (40 mg total) by mouth 2 (two) times daily. What changed: when to take this   polyethylene glycol 17 g packet Commonly known as: MIRALAX / GLYCOLAX Take 17 g by mouth 2 (two) times daily.   PROSTAT PO Take 30 mLs by mouth in the morning and at bedtime.   Restasis 0.05 % ophthalmic emulsion Generic drug: cycloSPORINE Place 1 drop into both eyes 2 (two) times daily as needed (dryness).   rosuvastatin 10 MG tablet  Commonly known as: CRESTOR Take 1 tablet (10 mg total) by mouth daily.   senna 8.6 MG Tabs tablet Commonly known as: SENOKOT Take 1 tablet (8.6 mg total) by mouth daily. Start taking on: August 01, 2021   sucralfate 1 GM/10ML suspension Commonly known as: CARAFATE Take 10 mLs (1 g total) by mouth 2 (two) times daily.   tiaGABine 4 MG tablet Commonly known as: GABITRIL Take 2 tablets (8 mg total) by mouth 2 (two) times daily.   torsemide 20 MG tablet Commonly known as: DEMADEX Take 1 tablet (20 mg total) by mouth as needed (swelling). What changed: when to take this   traMADol 50 MG tablet Commonly known as: ULTRAM Take 50 mg by mouth every 6 (six) hours as needed (pain).               Discharge Care Instructions  (From admission, onward)           Start     Ordered   07/31/21 0000  Discharge wound care:       Comments: See above   07/31/21 1209              Discharge Exam: Filed Weights   07/29/21 2111 07/30/21 0454 07/31/21 0100  Weight: 124.4 kg 124.8 kg 124 kg   General NAD Lungs; CTA no wheezing.  Abdomen; obese, NT  Subjective;  He report productive cough since admission, thick phlegm. Getting better.  He has been having outburst : asking for gun to ended. He mention that today, when I told him he was going to be transfer to rehab. He then said he wouldn't hurt him self. He just don't want to go to rehab. Daughter report he was seen by Psych recently for same behavior. He was suppose to be started on Cymbalta but the facility has not received prescription from psychiatrist.   Condition at discharge: stable  The results of significant diagnostics from this hospitalization (including imaging, microbiology, ancillary and laboratory) are listed below for reference.   Imaging Studies: CT ABDOMEN PELVIS W CONTRAST  Result Date: 07/29/2021 CLINICAL DATA:  Epigastric abdominal pain. Coffee-ground emesis yesterday and last night. History of ulcer disease. EXAM: CT ABDOMEN AND PELVIS WITH CONTRAST TECHNIQUE: Multidetector CT imaging of the abdomen and pelvis was performed using the standard protocol following bolus administration of intravenous contrast. RADIATION DOSE REDUCTION: This exam was performed according to the departmental dose-optimization program which includes automated exposure control, adjustment of the mA and/or kV according to patient size and/or use of iterative reconstruction technique. CONTRAST:  153m OMNIPAQUE IOHEXOL 300 MG/ML  SOLN COMPARISON:  02/17/2004 FINDINGS: Lower chest: Lung bases are clear except for what probably represents mild chronic scarring. The heart is enlarged. No pleural or pericardial fluid. Pacemaker leads in place. Hepatobiliary: Liver parenchyma is normal. Numerous calcified gallstones dependent in the gallbladder. Largest stone measures approximately 12 mm in diameter. No CT evidence of  cholecystitis or obstruction. Pancreas: Normal Spleen: Normal Adrenals/Urinary Tract: Adrenal glands are normal. Some renal atrophy. Bilateral renal cysts without evidence of stone, mass or obstruction. Foley catheter in the bladder. Stomach/Bowel: The stomach is full of fluid and some air. No visible abnormality of the duodenum. No evidence of small-bowel obstruction. Normal appendix. Normal amount of fecal matter and air within the colon. Diverticulosis of the left colon without evidence of diverticulitis. Large amount of fecal matter within the rectosigmoid. Vascular/Lymphatic: Aorta shows atherosclerotic change but no aneurysm. IVC is normal. No adenopathy. Reproductive: Normal Other: No  free fluid or air. Musculoskeletal: Ordinary lumbar degenerative changes are present. IMPRESSION: The stomach is full of fluid with some air. No evidence of gastric outlet obstruction. No visible ulcer disease by CT, which is not particularly sensitive. No small bowel obstruction. Fecal matter and air within the colon, with a large amount of stool in the rectosigmoid region. Diverticulosis without evidence of diverticulitis. Cholelithiasis without CT evidence of cholecystitis or obstruction. Aortic Atherosclerosis (ICD10-I70.0). Electronically Signed   By: Nelson Chimes M.D.   On: 07/29/2021 15:05    Microbiology: Results for orders placed or performed during the hospital encounter of 07/29/21  Resp Panel by RT-PCR (Flu A&B, Covid) Nasopharyngeal Swab     Status: None   Collection Time: 07/29/21  3:32 PM   Specimen: Nasopharyngeal Swab; Nasopharyngeal(NP) swabs in vial transport medium  Result Value Ref Range Status   SARS Coronavirus 2 by RT PCR NEGATIVE NEGATIVE Final    Comment: (NOTE) SARS-CoV-2 target nucleic acids are NOT DETECTED.  The SARS-CoV-2 RNA is generally detectable in upper respiratory specimens during the acute phase of infection. The lowest concentration of SARS-CoV-2 viral copies this assay can  detect is 138 copies/mL. A negative result does not preclude SARS-Cov-2 infection and should not be used as the sole basis for treatment or other patient management decisions. A negative result may occur with  improper specimen collection/handling, submission of specimen other than nasopharyngeal swab, presence of viral mutation(s) within the areas targeted by this assay, and inadequate number of viral copies(<138 copies/mL). A negative result must be combined with clinical observations, patient history, and epidemiological information. The expected result is Negative.  Fact Sheet for Patients:  EntrepreneurPulse.com.au  Fact Sheet for Healthcare Providers:  IncredibleEmployment.be  This test is no t yet approved or cleared by the [place] FDA and  has been authorized for detection and/or diagnosis of SARS-CoV-2 by FDA under an Emergency Use Authorization (EUA). This EUA will remain  in effect (meaning this test can be used) for the duration of the COVID-19 declaration under Section 564(b)(1) of the Act, 21 U.S.C.section 360bbb-3(b)(1), unless the authorization is terminated  or revoked sooner.       Influenza A by PCR NEGATIVE NEGATIVE Final   Influenza B by PCR NEGATIVE NEGATIVE Final    Comment: (NOTE) The Xpert Xpress SARS-CoV-2/FLU/RSV plus assay is intended as an aid in the diagnosis of influenza from Nasopharyngeal swab specimens and should not be used as a sole basis for treatment. Nasal washings and aspirates are unacceptable for Xpert Xpress SARS-CoV-2/FLU/RSV testing.  Fact Sheet for Patients: EntrepreneurPulse.com.au  Fact Sheet for Healthcare Providers: IncredibleEmployment.be  This test is not yet approved or cleared by the [place] FDA and has been authorized for detection and/or diagnosis of SARS-CoV-2 by FDA under an Emergency Use Authorization (EUA). This EUA will remain in  effect (meaning this test can be used) for the duration of the COVID-19 declaration under Section 564(b)(1) of the Act, 21 U.S.C. section 360bbb-3(b)(1), unless the authorization is terminated or revoked.  Performed at [facility], [place] [address] [place], [place] [address]   Urine Culture     Status: Abnormal (Preliminary result)   Collection Time: 07/30/21  2:26 PM   Specimen: Urine, Clean Catch  Result Value Ref Range Status   Specimen Description URINE, CLEAN CATCH  Final   Special Requests NONE  Final   Culture (A)  Final    >=100,000 COLONIES/mL GRAM NEGATIVE RODS >=100,000 COLONIES/mL ESCHERICHIA COLI SUSCEPTIBILITIES TO FOLLOW  Performed at [facility], [place] [address] [place], [place] [address]    Report Status PENDING  Incomplete    Labs: CBC: Recent Labs  Lab 07/29/21 1221 07/30/21 0505 07/30/21 1615 07/31/21 0554  WBC 11.1* 10.2 11.4* 9.1  NEUTROABS 9.9*  --   --   --   HGB 11.7* 10.4* 10.5* 9.7*  HCT 37.7* 31.2* 31.9* 29.1*  MCV 92.2 89.4 89.6 88.7  PLT 263 229 230 569   Basic Metabolic Panel: Recent Labs  Lab 07/29/21 1221 07/30/21 0505 07/31/21 0554  NA 136 134* 134*  K 4.1 4.2 4.1  CL 100 99 100  CO2 26 26 26   GLUCOSE 145* 115* 80  BUN 24* 34* 36*  CREATININE 1.38* 1.62* 1.53*  CALCIUM 9.7 9.2 9.1   Liver Function Tests: Recent Labs  Lab 07/29/21 1221  AST 20  ALT 14  ALKPHOS 84  BILITOT 1.6*  PROT 6.4*  ALBUMIN 2.8*   CBG: No results for input(s): GLUCAP in the last 168 hours.  Discharge time spent: greater than 30 minutes.  Signed: Elmarie Shiley, MD [facility] 07/31/2021

## 2021-08-01 ENCOUNTER — Encounter (HOSPITAL_COMMUNITY): Payer: Self-pay | Admitting: Gastroenterology

## 2021-08-01 DIAGNOSIS — I10 Essential (primary) hypertension: Secondary | ICD-10-CM | POA: Diagnosis not present

## 2021-08-01 DIAGNOSIS — N179 Acute kidney failure, unspecified: Secondary | ICD-10-CM | POA: Diagnosis not present

## 2021-08-01 DIAGNOSIS — I5020 Unspecified systolic (congestive) heart failure: Secondary | ICD-10-CM | POA: Diagnosis not present

## 2021-08-01 DIAGNOSIS — G20 Parkinson's disease: Secondary | ICD-10-CM | POA: Diagnosis not present

## 2021-08-01 DIAGNOSIS — L98419 Non-pressure chronic ulcer of buttock with unspecified severity: Secondary | ICD-10-CM | POA: Diagnosis not present

## 2021-08-01 DIAGNOSIS — R0902 Hypoxemia: Secondary | ICD-10-CM | POA: Diagnosis not present

## 2021-08-01 DIAGNOSIS — R748 Abnormal levels of other serum enzymes: Secondary | ICD-10-CM | POA: Diagnosis not present

## 2021-08-01 DIAGNOSIS — S4992XA Unspecified injury of left shoulder and upper arm, initial encounter: Secondary | ICD-10-CM | POA: Diagnosis not present

## 2021-08-01 DIAGNOSIS — R000 Tachycardia, unspecified: Secondary | ICD-10-CM | POA: Diagnosis not present

## 2021-08-01 DIAGNOSIS — M25561 Pain in right knee: Secondary | ICD-10-CM | POA: Diagnosis not present

## 2021-08-01 DIAGNOSIS — M7989 Other specified soft tissue disorders: Secondary | ICD-10-CM | POA: Diagnosis not present

## 2021-08-01 DIAGNOSIS — L89153 Pressure ulcer of sacral region, stage 3: Secondary | ICD-10-CM | POA: Diagnosis not present

## 2021-08-01 DIAGNOSIS — R531 Weakness: Secondary | ICD-10-CM | POA: Diagnosis not present

## 2021-08-01 DIAGNOSIS — S01112A Laceration without foreign body of left eyelid and periocular area, initial encounter: Secondary | ICD-10-CM | POA: Diagnosis not present

## 2021-08-01 DIAGNOSIS — G629 Polyneuropathy, unspecified: Secondary | ICD-10-CM | POA: Diagnosis not present

## 2021-08-01 DIAGNOSIS — I5030 Unspecified diastolic (congestive) heart failure: Secondary | ICD-10-CM | POA: Diagnosis not present

## 2021-08-01 DIAGNOSIS — R58 Hemorrhage, not elsewhere classified: Secondary | ICD-10-CM | POA: Diagnosis not present

## 2021-08-01 DIAGNOSIS — M4802 Spinal stenosis, cervical region: Secondary | ICD-10-CM | POA: Diagnosis not present

## 2021-08-01 DIAGNOSIS — S199XXA Unspecified injury of neck, initial encounter: Secondary | ICD-10-CM | POA: Diagnosis not present

## 2021-08-01 DIAGNOSIS — E785 Hyperlipidemia, unspecified: Secondary | ICD-10-CM | POA: Diagnosis not present

## 2021-08-01 DIAGNOSIS — K219 Gastro-esophageal reflux disease without esophagitis: Secondary | ICD-10-CM | POA: Diagnosis not present

## 2021-08-01 DIAGNOSIS — M6281 Muscle weakness (generalized): Secondary | ICD-10-CM | POA: Diagnosis not present

## 2021-08-01 DIAGNOSIS — Z87891 Personal history of nicotine dependence: Secondary | ICD-10-CM | POA: Diagnosis not present

## 2021-08-01 DIAGNOSIS — Z20822 Contact with and (suspected) exposure to covid-19: Secondary | ICD-10-CM | POA: Diagnosis not present

## 2021-08-01 DIAGNOSIS — S0990XA Unspecified injury of head, initial encounter: Secondary | ICD-10-CM | POA: Diagnosis not present

## 2021-08-01 DIAGNOSIS — T83511A Infection and inflammatory reaction due to indwelling urethral catheter, initial encounter: Secondary | ICD-10-CM

## 2021-08-01 DIAGNOSIS — I50812 Chronic right heart failure: Secondary | ICD-10-CM | POA: Diagnosis not present

## 2021-08-01 DIAGNOSIS — S0291XA Unspecified fracture of skull, initial encounter for closed fracture: Secondary | ICD-10-CM | POA: Diagnosis not present

## 2021-08-01 DIAGNOSIS — R7309 Other abnormal glucose: Secondary | ICD-10-CM | POA: Diagnosis not present

## 2021-08-01 DIAGNOSIS — R2681 Unsteadiness on feet: Secondary | ICD-10-CM | POA: Diagnosis not present

## 2021-08-01 DIAGNOSIS — I4891 Unspecified atrial fibrillation: Secondary | ICD-10-CM | POA: Diagnosis not present

## 2021-08-01 DIAGNOSIS — Z7401 Bed confinement status: Secondary | ICD-10-CM | POA: Diagnosis not present

## 2021-08-01 DIAGNOSIS — M47812 Spondylosis without myelopathy or radiculopathy, cervical region: Secondary | ICD-10-CM | POA: Diagnosis not present

## 2021-08-01 DIAGNOSIS — K922 Gastrointestinal hemorrhage, unspecified: Secondary | ICD-10-CM | POA: Diagnosis not present

## 2021-08-01 DIAGNOSIS — W19XXXD Unspecified fall, subsequent encounter: Secondary | ICD-10-CM | POA: Diagnosis not present

## 2021-08-01 DIAGNOSIS — Z79899 Other long term (current) drug therapy: Secondary | ICD-10-CM | POA: Diagnosis not present

## 2021-08-01 DIAGNOSIS — R0689 Other abnormalities of breathing: Secondary | ICD-10-CM | POA: Diagnosis not present

## 2021-08-01 DIAGNOSIS — I442 Atrioventricular block, complete: Secondary | ICD-10-CM | POA: Diagnosis not present

## 2021-08-01 DIAGNOSIS — N390 Urinary tract infection, site not specified: Secondary | ICD-10-CM | POA: Diagnosis not present

## 2021-08-01 DIAGNOSIS — E039 Hypothyroidism, unspecified: Secondary | ICD-10-CM | POA: Diagnosis not present

## 2021-08-01 DIAGNOSIS — S0993XA Unspecified injury of face, initial encounter: Secondary | ICD-10-CM | POA: Diagnosis present

## 2021-08-01 DIAGNOSIS — N183 Chronic kidney disease, stage 3 unspecified: Secondary | ICD-10-CM | POA: Diagnosis not present

## 2021-08-01 DIAGNOSIS — F39 Unspecified mood [affective] disorder: Secondary | ICD-10-CM | POA: Diagnosis not present

## 2021-08-01 DIAGNOSIS — K220 Achalasia of cardia: Secondary | ICD-10-CM | POA: Diagnosis not present

## 2021-08-01 DIAGNOSIS — Z7901 Long term (current) use of anticoagulants: Secondary | ICD-10-CM | POA: Diagnosis not present

## 2021-08-01 DIAGNOSIS — N182 Chronic kidney disease, stage 2 (mild): Secondary | ICD-10-CM | POA: Diagnosis not present

## 2021-08-01 DIAGNOSIS — J90 Pleural effusion, not elsewhere classified: Secondary | ICD-10-CM | POA: Diagnosis not present

## 2021-08-01 DIAGNOSIS — F329 Major depressive disorder, single episode, unspecified: Secondary | ICD-10-CM | POA: Diagnosis not present

## 2021-08-01 DIAGNOSIS — M4182 Other forms of scoliosis, cervical region: Secondary | ICD-10-CM | POA: Diagnosis not present

## 2021-08-01 DIAGNOSIS — M1990 Unspecified osteoarthritis, unspecified site: Secondary | ICD-10-CM | POA: Diagnosis not present

## 2021-08-01 DIAGNOSIS — K5900 Constipation, unspecified: Secondary | ICD-10-CM | POA: Diagnosis not present

## 2021-08-01 DIAGNOSIS — R062 Wheezing: Secondary | ICD-10-CM | POA: Diagnosis not present

## 2021-08-01 DIAGNOSIS — J329 Chronic sinusitis, unspecified: Secondary | ICD-10-CM | POA: Diagnosis not present

## 2021-08-01 DIAGNOSIS — I517 Cardiomegaly: Secondary | ICD-10-CM | POA: Diagnosis not present

## 2021-08-01 DIAGNOSIS — I110 Hypertensive heart disease with heart failure: Secondary | ICD-10-CM | POA: Diagnosis not present

## 2021-08-01 DIAGNOSIS — I5022 Chronic systolic (congestive) heart failure: Secondary | ICD-10-CM | POA: Diagnosis not present

## 2021-08-01 DIAGNOSIS — J9811 Atelectasis: Secondary | ICD-10-CM | POA: Diagnosis not present

## 2021-08-01 DIAGNOSIS — R262 Difficulty in walking, not elsewhere classified: Secondary | ICD-10-CM | POA: Diagnosis not present

## 2021-08-01 DIAGNOSIS — Z043 Encounter for examination and observation following other accident: Secondary | ICD-10-CM | POA: Diagnosis not present

## 2021-08-01 DIAGNOSIS — I455 Other specified heart block: Secondary | ICD-10-CM | POA: Diagnosis not present

## 2021-08-01 DIAGNOSIS — F0390 Unspecified dementia without behavioral disturbance: Secondary | ICD-10-CM | POA: Diagnosis not present

## 2021-08-01 DIAGNOSIS — Y92199 Unspecified place in other specified residential institution as the place of occurrence of the external cause: Secondary | ICD-10-CM | POA: Diagnosis not present

## 2021-08-01 DIAGNOSIS — I480 Paroxysmal atrial fibrillation: Secondary | ICD-10-CM | POA: Diagnosis not present

## 2021-08-01 DIAGNOSIS — G4733 Obstructive sleep apnea (adult) (pediatric): Secondary | ICD-10-CM | POA: Diagnosis not present

## 2021-08-01 DIAGNOSIS — M11262 Other chondrocalcinosis, left knee: Secondary | ICD-10-CM | POA: Diagnosis not present

## 2021-08-01 DIAGNOSIS — W19XXXA Unspecified fall, initial encounter: Secondary | ICD-10-CM | POA: Diagnosis not present

## 2021-08-01 DIAGNOSIS — F419 Anxiety disorder, unspecified: Secondary | ICD-10-CM | POA: Diagnosis not present

## 2021-08-01 DIAGNOSIS — R2689 Other abnormalities of gait and mobility: Secondary | ICD-10-CM | POA: Diagnosis not present

## 2021-08-01 DIAGNOSIS — Z23 Encounter for immunization: Secondary | ICD-10-CM | POA: Diagnosis not present

## 2021-08-01 DIAGNOSIS — G9341 Metabolic encephalopathy: Secondary | ICD-10-CM | POA: Diagnosis not present

## 2021-08-01 DIAGNOSIS — Z950 Presence of cardiac pacemaker: Secondary | ICD-10-CM | POA: Diagnosis not present

## 2021-08-01 DIAGNOSIS — I5032 Chronic diastolic (congestive) heart failure: Secondary | ICD-10-CM | POA: Diagnosis not present

## 2021-08-01 DIAGNOSIS — E1122 Type 2 diabetes mellitus with diabetic chronic kidney disease: Secondary | ICD-10-CM | POA: Diagnosis not present

## 2021-08-01 DIAGNOSIS — E871 Hypo-osmolality and hyponatremia: Secondary | ICD-10-CM | POA: Diagnosis not present

## 2021-08-01 DIAGNOSIS — M25522 Pain in left elbow: Secondary | ICD-10-CM | POA: Diagnosis not present

## 2021-08-01 LAB — URINE CULTURE: Culture: >=100000 — AB

## 2021-08-01 MED ORDER — ALBUTEROL SULFATE (2.5 MG/3ML) 0.083% IN NEBU: 2.5000 mg | INHALATION_SOLUTION | Freq: Four times a day (QID) | RESPIRATORY_TRACT | Status: DC

## 2021-08-01 MED ORDER — ALBUTEROL SULFATE (2.5 MG/3ML) 0.083% IN NEBU: 2.5000 mg | INHALATION_SOLUTION | Freq: Four times a day (QID) | RESPIRATORY_TRACT | 12 refills | Status: DC

## 2021-08-01 NOTE — Discharge Summary (Addendum)
Physician Discharge Summary   Patient: [name] MRN: [id number] DOB: 09-01-1940  Admit date:     07/29/2021  Discharge date: 08/01/21  Discharge Physician: Elmarie Shiley   PCP: [name], MD   Recommendations at discharge:    Needs CBC and B-met.  Monitor volume status and consider torsemide as needed.  Needs Protonix 40 mg BID for 4 weeks, then daily for 4 to 8 weeks.  Needs Carafate 1 gr BID for 2 weeks.     Discharge Diagnoses: Principal Problem:   Upper GI bleeding Active Problems:   Paroxysmal atrial fibrillation ([place])   ?  Idiopathic Parkinson disease    Hypothyroidism-TSH 1.345 on 1/23   HTN (hypertension)   Chronic HFrEF (heart failure with reduced ejection fraction) (EF 45-50% by echo on 03/23/2021)   OSA (obstructive sleep apnea)   Anxiety disorder   Constipation   DNR (do not resuscitate)   Depression  Resolved Problems:   * No resolved hospital problems. *   Hospital Course: 81 year old with past medical history significant for diabetes, CKD, hypertension, hyperlipidemia, chronic systolic heart failure, pacemaker, A-fib on Eliquis, hypothyroidism, Parkinson's not on medication presenting with coffee-ground emesis.  He had a EGD in January with peptic ulcer disease.  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  Dysphagia 1 diet with thin liquids.  He presented with complaint of vomiting coffee-ground emesis.  He also reported constipation.  CT Scan; the stomach is full of fluid with some air.  No evidence of gastric outlet obstruction.  No visible ulcers by CT.  No small bowel obstruction.  Fecal matter and air within the colon with large amount of stool in the rectosigmoid region.  Diverticulosis without evidence of diverticulitis.  Assessment and Plan: * Upper GI bleeding- (present on admission) -Patient is presenting with hematemesis, suggestive of upper GI bleeding. 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 -Continue with PPI.  -Underwent endoscopy 2/28: Grade D erosive esophagitis with no bleeding, Gastritis.  Patient will need PPI BID for 4 weeks, then daily for 4 to 8 weeks. Started on Carafate 1 gr BID for 2 weeks.    Depression He denies intent to harm himself.  I called Psychiatrist Office, he was suppose to be stated on Cymbalta 30 mg daily. I have started Cymbalta. Will provide prescriptions.    DNR (do not resuscitate)- (present on admission) -Has paperwork at the bedside  Constipation- (present on admission) -Significant constipation appreciated on imaging -Had BM 2/27 after enema.  -Continue with  Miralax, senna.   Anxiety disorder- (present on admission) -Reportedly tapered off BZD previously but started on BID Klonopin during last hospitalization, will continue -Continue Gabitril and prn hydroxyzine. -He also takes Xanax twice daily as needed.   OSA (obstructive sleep apnea)- (present on admission) -resume BIPAP at discharge  Chronic HFrEF (heart failure with reduced ejection fraction) (EF 45-50% by echo on 03/23/2021)- (present on admission) -Has pacemaker. -Compensated. PRN torsemide.   HTN (hypertension)- (present on admission) -Continue Crestor  Hypothyroidism-TSH 1.345 on 1/23- (present on admission) -Continue Synthroid  ?  Idiopathic Parkinson disease - (present on admission) -Undergoing evaluation by neurology -Not currently on Sinemet. Needs follow up with Neurologist.   Paroxysmal atrial fibrillation ([place])- (present on admission) -Rate controlled without medication -Has pacemaker. -Eliquis last dose was 2/26., holding due to UGI bleeding.  Per GI ok to resume Eliquis in 2 days.    UTI; in setting chronic foley catheter; urine grew E coli and Klebsiella. Discharge on kelflex for 5 days.  Mild sporadic wheezing,  sat stable. Will give nebulizer.  Stable for discharge        Consultants: GI Procedures performed: Endoscopy Disposition: Skilled nursing facility Diet recommendation:  Discharge Diet Orders (From admission, onward)     Start     Ordered   07/31/21 0000  Diet - low sodium heart healthy        07/31/21 1209           Cardiac diet Soft  Diet, advance as tolerated.   DISCHARGE MEDICATION: Allergies as of 08/01/2021       Reactions   Hydrochlorothiazide Other (See Comments)   "gout flares"   Lexapro [escitalopram] Other (See Comments)   Ineffective for anxiety (10/2011)   Lisinopril Swelling, Other (See Comments)   Angioedema   Silodosin Diarrhea   Tamsulosin Other (See Comments)   Visual changes        Medication List     TAKE these medications    acetaminophen 500 MG tablet Commonly known as: TYLENOL Take 1,000 mg by mouth every 6 (six) hours as needed for headache or mild pain.   albuterol (2.5 MG/3ML) 0.083% nebulizer solution Commonly known as: PROVENTIL Take 3 mLs (2.5 mg total) by nebulization every 6 (six) hours.   ALPRAZolam 0.5 MG tablet Commonly known as: XANAX Take 1 tablet (0.5 mg total) by mouth 2 (two) times daily as needed for anxiety.   amLODipine 5 MG tablet Commonly known as: NORVASC Take 1 tablet (5 mg total) by mouth daily.   apixaban 5 MG Tabs tablet Commonly known as: ELIQUIS Take 1 tablet (5 mg total) by mouth 2 (two) times daily. Start taking on: August 03, 2021 What changed: These instructions start on August 03, 2021. If you are unsure what to do until then, ask your doctor or other care provider.   cephALEXin 500 MG capsule Commonly known as: KEFLEX Take 1 capsule (500 mg total) by mouth 3 (three) times daily for 5 days.   clonazePAM 0.5 MG disintegrating tablet Commonly known as: KLONOPIN Take 1 tablet (0.5 mg total) by mouth 2 (two) times daily.   Decubi-Vite Caps Take 1 capsule by mouth daily.   diphenhydrAMINE-zinc  acetate cream Commonly known as: BENADRYL Apply topically 2 (two) times daily as needed for itching. What changed: how much to take   DULoxetine 30 MG capsule Commonly known as: CYMBALTA Take 1 capsule (30 mg total) by mouth daily.   guaiFENesin 600 MG 12 hr tablet Commonly known as: MUCINEX Take 1 tablet (600 mg total) by mouth 2 (two) times daily.   hydrOXYzine 25 MG tablet Commonly known as: ATARAX Take 25 mg by mouth every 8 (eight) hours as needed for itching.   levothyroxine 137 MCG tablet Commonly known as: SYNTHROID Take 137 mcg by mouth daily before breakfast.   lidocaine 5 % Commonly known as: LIDODERM Place 1 patch onto the skin daily. Apply to right shoulder and bilateral knees. Remove & Discard patch within 12 hours.   melatonin 5 MG Tabs Take 5-10 mg by mouth at bedtime.   pantoprazole 40 MG tablet Commonly known as: Protonix Take 1 tablet (40 mg total) by mouth 2 (two) times daily. What changed: when to take this   polyethylene glycol 17 g packet Commonly known as: MIRALAX / GLYCOLAX Take 17 g  by mouth 2 (two) times daily.   PROSTAT PO Take 30 mLs by mouth in the morning and at bedtime.   Restasis 0.05 % ophthalmic emulsion Generic drug: cycloSPORINE Place 1 drop into both eyes 2 (two) times daily as needed (dryness).   rosuvastatin 10 MG tablet Commonly known as: CRESTOR Take 1 tablet (10 mg total) by mouth daily.   senna 8.6 MG Tabs tablet Commonly known as: SENOKOT Take 1 tablet (8.6 mg total) by mouth daily.   sucralfate 1 GM/10ML suspension Commonly known as: CARAFATE Take 10 mLs (1 g total) by mouth 2 (two) times daily.   tiaGABine 4 MG tablet Commonly known as: GABITRIL Take 2 tablets (8 mg total) by mouth 2 (two) times daily.   torsemide 20 MG tablet Commonly known as: DEMADEX Take 1 tablet (20 mg total) by mouth as needed (swelling). What changed: when to take this   traMADol 50 MG tablet Commonly known as: ULTRAM Take 50 mg  by mouth every 6 (six) hours as needed (pain).               Discharge Care Instructions  (From admission, onward)           Start     Ordered   07/31/21 0000  Discharge wound care:       Comments: See above   07/31/21 1209             Discharge Exam: Filed Weights   07/30/21 0454 07/31/21 0100 08/01/21 0456  Weight: 124.8 kg 124 kg 124.4 kg   General NAD Lungs; CTA no wheezing.  Abdomen; obese, NT  Subjective;  He report productive cough since admission, thick phlegm. Getting better.  He has been having outburst : asking for gun to ended. He mention that today, when I told him he was going to be transfer to rehab. He then said he wouldn't hurt him self. He just don't want to go to rehab. Daughter report he was seen by Psych recently for same behavior. He was suppose to be started on Cymbalta but the facility has not received prescription from psychiatrist.   Condition at discharge: stable  The results of significant diagnostics from this hospitalization (including imaging, microbiology, ancillary and laboratory) are listed below for reference.   Imaging Studies: CT ABDOMEN PELVIS W CONTRAST  Result Date: 07/29/2021 CLINICAL DATA:  Epigastric abdominal pain. Coffee-ground emesis yesterday and last night. History of ulcer disease. EXAM: CT ABDOMEN AND PELVIS WITH CONTRAST TECHNIQUE: Multidetector CT imaging of the abdomen and pelvis was performed using the standard protocol following bolus administration of intravenous contrast. RADIATION DOSE REDUCTION: This exam was performed according to the departmental dose-optimization program which includes automated exposure control, adjustment of the mA and/or kV according to patient size and/or use of iterative reconstruction technique. CONTRAST:  167m OMNIPAQUE IOHEXOL 300 MG/ML  SOLN COMPARISON:  02/17/2004 FINDINGS: Lower chest: Lung bases are clear except for what probably represents mild chronic scarring. The heart is  enlarged. No pleural or pericardial fluid. Pacemaker leads in place. Hepatobiliary: Liver parenchyma is normal. Numerous calcified gallstones dependent in the gallbladder. Largest stone measures approximately 12 mm in diameter. No CT evidence of cholecystitis or obstruction. Pancreas: Normal Spleen: Normal Adrenals/Urinary Tract: Adrenal glands are normal. Some renal atrophy. Bilateral renal cysts without evidence of stone, mass or obstruction. Foley catheter in the bladder. Stomach/Bowel: The stomach is full of fluid and some air. No visible abnormality of the duodenum. No evidence of small-bowel obstruction. Normal appendix.  Normal amount of fecal matter and air within the colon. Diverticulosis of the left colon without evidence of diverticulitis. Large amount of fecal matter within the rectosigmoid. Vascular/Lymphatic: Aorta shows atherosclerotic change but no aneurysm. IVC is normal. No adenopathy. Reproductive: Normal Other: No free fluid or air. Musculoskeletal: Ordinary lumbar degenerative changes are present. IMPRESSION: The stomach is full of fluid with some air. No evidence of gastric outlet obstruction. No visible ulcer disease by CT, which is not particularly sensitive. No small bowel obstruction. Fecal matter and air within the colon, with a large amount of stool in the rectosigmoid region. Diverticulosis without evidence of diverticulitis. Cholelithiasis without CT evidence of cholecystitis or obstruction. Aortic Atherosclerosis (ICD10-I70.0). Electronically Signed   By: Nelson Chimes M.D.   On: 07/29/2021 15:05    Microbiology: Results for orders placed or performed during the hospital encounter of 07/29/21  Resp Panel by RT-PCR (Flu A&B, Covid) Nasopharyngeal Swab     Status: None   Collection Time: 07/29/21  3:32 PM   Specimen: Nasopharyngeal Swab; Nasopharyngeal(NP) swabs in vial transport medium  Result Value Ref Range Status   SARS Coronavirus 2 by RT PCR NEGATIVE NEGATIVE Final     Comment: (NOTE) SARS-CoV-2 target nucleic acids are NOT DETECTED.  The SARS-CoV-2 RNA is generally detectable in upper respiratory specimens during the acute phase of infection. The lowest concentration of SARS-CoV-2 viral copies this assay can detect is 138 copies/mL. A negative result does not preclude SARS-Cov-2 infection and should not be used as the sole basis for treatment or other patient management decisions. A negative result may occur with  improper specimen collection/handling, submission of specimen other than nasopharyngeal swab, presence of viral mutation(s) within the areas targeted by this assay, and inadequate number of viral copies(<138 copies/mL). A negative result must be combined with clinical observations, patient history, and epidemiological information. The expected result is Negative.  Fact Sheet for Patients:  EntrepreneurPulse.com.au  Fact Sheet for Healthcare Providers:  IncredibleEmployment.be  This test is no t yet approved or cleared by the [place] FDA and  has been authorized for detection and/or diagnosis of SARS-CoV-2 by FDA under an Emergency Use Authorization (EUA). This EUA will remain  in effect (meaning this test can be used) for the duration of the COVID-19 declaration under Section 564(b)(1) of the Act, 21 U.S.C.section 360bbb-3(b)(1), unless the authorization is terminated  or revoked sooner.       Influenza A by PCR NEGATIVE NEGATIVE Final   Influenza B by PCR NEGATIVE NEGATIVE Final    Comment: (NOTE) The Xpert Xpress SARS-CoV-2/FLU/RSV plus assay is intended as an aid in the diagnosis of influenza from Nasopharyngeal swab specimens and should not be used as a sole basis for treatment. Nasal washings and aspirates are unacceptable for Xpert Xpress SARS-CoV-2/FLU/RSV testing.  Fact Sheet for Patients: EntrepreneurPulse.com.au  Fact Sheet for Healthcare  Providers: IncredibleEmployment.be  This test is not yet approved or cleared by the [place] FDA and has been authorized for detection and/or diagnosis of SARS-CoV-2 by FDA under an Emergency Use Authorization (EUA). This EUA will remain in effect (meaning this test can be used) for the duration of the COVID-19 declaration under Section 564(b)(1) of the Act, 21 U.S.C. section 360bbb-3(b)(1), unless the authorization is terminated or revoked.  Performed at [facility], [place] [address], [place], [place] [address]   Urine Culture     Status: Abnormal   Collection Time: 07/30/21  2:26 PM   Specimen: Urine, Clean Catch  Result Value Ref Range Status   Specimen Description URINE, CLEAN CATCH  Final   Special Requests   Final    NONE Performed at [facility], [address], [place], [place] [address]    Culture (A)  Final    >=100,000 COLONIES/mL KLEBSIELLA PNEUMONIAE >=100,000 COLONIES/mL ESCHERICHIA COLI    Report Status 08/01/2021 FINAL  Final   Organism ID, Bacteria KLEBSIELLA PNEUMONIAE (A)  Final   Organism ID, Bacteria ESCHERICHIA COLI (A)  Final      Susceptibility   Escherichia coli - MIC*    AMPICILLIN <=2 SENSITIVE Sensitive     CEFAZOLIN <=4 SENSITIVE Sensitive     CEFEPIME <=0.12 SENSITIVE Sensitive     CEFTRIAXONE <=0.25 SENSITIVE Sensitive     CIPROFLOXACIN <=0.25 SENSITIVE Sensitive     GENTAMICIN <=1 SENSITIVE Sensitive     IMIPENEM 0.5 SENSITIVE Sensitive     NITROFURANTOIN <=16 SENSITIVE Sensitive     TRIMETH/SULFA <=20 SENSITIVE Sensitive     AMPICILLIN/SULBACTAM <=2 SENSITIVE Sensitive     PIP/TAZO <=4 SENSITIVE Sensitive     * >=100,000 COLONIES/mL ESCHERICHIA COLI   Klebsiella pneumoniae - MIC*    AMPICILLIN RESISTANT Resistant     CEFAZOLIN <=4 SENSITIVE Sensitive     CEFEPIME <=0.12 SENSITIVE Sensitive     CEFTRIAXONE <=0.25 SENSITIVE Sensitive     CIPROFLOXACIN <=0.25 SENSITIVE Sensitive     GENTAMICIN <=1  SENSITIVE Sensitive     IMIPENEM <=0.25 SENSITIVE Sensitive     NITROFURANTOIN <=16 SENSITIVE Sensitive     TRIMETH/SULFA <=20 SENSITIVE Sensitive     AMPICILLIN/SULBACTAM <=2 SENSITIVE Sensitive     PIP/TAZO <=4 SENSITIVE Sensitive     * >=100,000 COLONIES/mL KLEBSIELLA PNEUMONIAE    Labs: CBC: Recent Labs  Lab 07/29/21 1221 07/30/21 0505 07/30/21 1615 07/31/21 0554 07/31/21 1645  WBC 11.1* 10.2 11.4* 9.1 7.8  NEUTROABS 9.9*  --   --   --   --   HGB 11.7* 10.4* 10.5* 9.7* 9.5*  HCT 37.7* 31.2* 31.9* 29.1* 30.1*  MCV 92.2 89.4 89.6 88.7 90.9  PLT 263 229 230 199 413   Basic Metabolic Panel: Recent Labs  Lab 07/29/21 1221 07/30/21 0505 07/31/21 0554  NA 136 134* 134*  K 4.1 4.2 4.1  CL 100 99 100  CO2 26 26 26   GLUCOSE 145* 115* 80  BUN 24* 34* 36*  CREATININE 1.38* 1.62* 1.53*  CALCIUM 9.7 9.2 9.1   Liver Function Tests: Recent Labs  Lab 07/29/21 1221  AST 20  ALT 14  ALKPHOS 84  BILITOT 1.6*  PROT 6.4*  ALBUMIN 2.8*   CBG: No results for input(s): GLUCAP in the last 168 hours.  Discharge time spent: greater than 30 minutes.  Signed: Elmarie Shiley, MD [facility] 08/01/2021

## 2021-08-01 NOTE — TOC Transition Note (Signed)
Transition of Care (TOC) - CM/SW Discharge Note ? ? ?Patient Details  ?Name: [name] ?MRN: [id number] ?Date of Birth: Nov 04, 1940 ? ?Transition of Care (TOC) CM/SW Contact:  ?Reece Agar, LCSWA ?Phone Number: ?08/01/2021, 11:58 AM ? ? ?Clinical Narrative:    ?[facility] to[facility] ?Anticipated DC date: 08/01/2021 ?Family notified: Pt spouse and daughter ?Transport by: Corey Harold ? ? ?Per MD patient ready for DC to [facility] room 3222. RN to call report prior to discharge [phone]). RN, patient, patient's family, and facility notified of DC. Discharge Summary and FL2 sent to facility. DC packet on chart. Ambulance transport requested for patient.  ? ?CSW will sign off for now as social work intervention is no longer needed. Please consult [place] again if new needs arise. ?  ? ? ?Final next level of care: [place] ?Barriers to Discharge: Continued Medical Work up ? ? ?Patient Goals and CMS Choice ?Patient states their goals for this hospitalization and ongoing recovery are:: Continued Rehab ?CMS Medicare.gov Compare Post Acute Care list provided to:: Patient ?Choice offered to / list presented to : Patient ? ?Discharge Placement ?  ?           ?  ?  ?  ?  ? ?Discharge Plan and Services ?In-house Referral: Clinical Social Work ?  ?Post Acute Care Choice: [place]          ?  ?  ?  ?  ?  ?  ?  ?  ?  ?  ? ?Social Determinants of Health (SDOH) Interventions ?  ? ? ?Readmission Risk Interventions ?No flowsheet data found. ? ? ? ? ?

## 2021-08-01 NOTE — Progress Notes (Signed)
Attempted to call report to [facility]. No answer. Phone number left with front desk ?

## 2021-08-02 DIAGNOSIS — K219 Gastro-esophageal reflux disease without esophagitis: Secondary | ICD-10-CM | POA: Diagnosis not present

## 2021-08-02 DIAGNOSIS — R7309 Other abnormal glucose: Secondary | ICD-10-CM | POA: Diagnosis not present

## 2021-08-02 DIAGNOSIS — L89153 Pressure ulcer of sacral region, stage 3: Secondary | ICD-10-CM | POA: Diagnosis not present

## 2021-08-02 DIAGNOSIS — I10 Essential (primary) hypertension: Secondary | ICD-10-CM | POA: Diagnosis not present

## 2021-08-02 DIAGNOSIS — I4891 Unspecified atrial fibrillation: Secondary | ICD-10-CM | POA: Diagnosis not present

## 2021-08-02 DIAGNOSIS — K220 Achalasia of cardia: Secondary | ICD-10-CM | POA: Diagnosis not present

## 2021-08-02 DIAGNOSIS — I5020 Unspecified systolic (congestive) heart failure: Secondary | ICD-10-CM | POA: Diagnosis not present

## 2021-08-02 DIAGNOSIS — E039 Hypothyroidism, unspecified: Secondary | ICD-10-CM | POA: Diagnosis not present

## 2021-08-02 DIAGNOSIS — G20 Parkinson's disease: Secondary | ICD-10-CM | POA: Diagnosis not present

## 2021-08-02 DIAGNOSIS — I455 Other specified heart block: Secondary | ICD-10-CM | POA: Diagnosis not present

## 2021-08-02 DIAGNOSIS — E785 Hyperlipidemia, unspecified: Secondary | ICD-10-CM | POA: Diagnosis not present

## 2021-08-04 ENCOUNTER — Other Ambulatory Visit: Payer: Self-pay

## 2021-08-04 ENCOUNTER — Emergency Department (HOSPITAL_COMMUNITY): Payer: Medicare HMO

## 2021-08-04 ENCOUNTER — Emergency Department (HOSPITAL_COMMUNITY)
Admission: EM | Admit: 2021-08-04 | Discharge: 2021-08-04 | Disposition: A | Discharge status: Skilled Nursing Facility | Payer: Medicare HMO | Attending: Emergency Medicine | Admitting: Emergency Medicine

## 2021-08-04 DIAGNOSIS — Z79899 Other long term (current) drug therapy: Secondary | ICD-10-CM | POA: Insufficient documentation

## 2021-08-04 DIAGNOSIS — E039 Hypothyroidism, unspecified: Secondary | ICD-10-CM | POA: Diagnosis not present

## 2021-08-04 DIAGNOSIS — J90 Pleural effusion, not elsewhere classified: Secondary | ICD-10-CM | POA: Insufficient documentation

## 2021-08-04 DIAGNOSIS — Z20822 Contact with and (suspected) exposure to covid-19: Secondary | ICD-10-CM | POA: Insufficient documentation

## 2021-08-04 DIAGNOSIS — Z7901 Long term (current) use of anticoagulants: Secondary | ICD-10-CM | POA: Diagnosis not present

## 2021-08-04 DIAGNOSIS — S01112A Laceration without foreign body of left eyelid and periocular area, initial encounter: Secondary | ICD-10-CM | POA: Insufficient documentation

## 2021-08-04 DIAGNOSIS — Z950 Presence of cardiac pacemaker: Secondary | ICD-10-CM | POA: Diagnosis not present

## 2021-08-04 DIAGNOSIS — I50812 Chronic right heart failure: Secondary | ICD-10-CM | POA: Insufficient documentation

## 2021-08-04 DIAGNOSIS — Z043 Encounter for examination and observation following other accident: Secondary | ICD-10-CM | POA: Diagnosis not present

## 2021-08-04 DIAGNOSIS — T83511A Infection and inflammatory reaction due to indwelling urethral catheter, initial encounter: Secondary | ICD-10-CM

## 2021-08-04 DIAGNOSIS — E871 Hypo-osmolality and hyponatremia: Secondary | ICD-10-CM | POA: Diagnosis not present

## 2021-08-04 DIAGNOSIS — S0993XA Unspecified injury of face, initial encounter: Secondary | ICD-10-CM | POA: Diagnosis present

## 2021-08-04 DIAGNOSIS — J9811 Atelectasis: Secondary | ICD-10-CM | POA: Diagnosis not present

## 2021-08-04 DIAGNOSIS — N390 Urinary tract infection, site not specified: Secondary | ICD-10-CM | POA: Insufficient documentation

## 2021-08-04 DIAGNOSIS — M11262 Other chondrocalcinosis, left knee: Secondary | ICD-10-CM | POA: Diagnosis not present

## 2021-08-04 DIAGNOSIS — G20 Parkinson's disease: Secondary | ICD-10-CM | POA: Diagnosis not present

## 2021-08-04 DIAGNOSIS — Y92199 Unspecified place in other specified residential institution as the place of occurrence of the external cause: Secondary | ICD-10-CM | POA: Insufficient documentation

## 2021-08-04 DIAGNOSIS — I517 Cardiomegaly: Secondary | ICD-10-CM | POA: Insufficient documentation

## 2021-08-04 DIAGNOSIS — S4992XA Unspecified injury of left shoulder and upper arm, initial encounter: Secondary | ICD-10-CM | POA: Diagnosis not present

## 2021-08-04 DIAGNOSIS — N182 Chronic kidney disease, stage 2 (mild): Secondary | ICD-10-CM | POA: Insufficient documentation

## 2021-08-04 DIAGNOSIS — E1122 Type 2 diabetes mellitus with diabetic chronic kidney disease: Secondary | ICD-10-CM | POA: Insufficient documentation

## 2021-08-04 DIAGNOSIS — I4891 Unspecified atrial fibrillation: Secondary | ICD-10-CM | POA: Insufficient documentation

## 2021-08-04 DIAGNOSIS — M25522 Pain in left elbow: Secondary | ICD-10-CM | POA: Diagnosis not present

## 2021-08-04 DIAGNOSIS — S199XXA Unspecified injury of neck, initial encounter: Secondary | ICD-10-CM | POA: Diagnosis not present

## 2021-08-04 DIAGNOSIS — M4182 Other forms of scoliosis, cervical region: Secondary | ICD-10-CM | POA: Diagnosis not present

## 2021-08-04 DIAGNOSIS — M47812 Spondylosis without myelopathy or radiculopathy, cervical region: Secondary | ICD-10-CM | POA: Diagnosis not present

## 2021-08-04 DIAGNOSIS — Z23 Encounter for immunization: Secondary | ICD-10-CM | POA: Diagnosis not present

## 2021-08-04 DIAGNOSIS — W19XXXA Unspecified fall, initial encounter: Secondary | ICD-10-CM | POA: Diagnosis not present

## 2021-08-04 DIAGNOSIS — Z87891 Personal history of nicotine dependence: Secondary | ICD-10-CM | POA: Insufficient documentation

## 2021-08-04 DIAGNOSIS — J329 Chronic sinusitis, unspecified: Secondary | ICD-10-CM | POA: Diagnosis not present

## 2021-08-04 DIAGNOSIS — I10 Essential (primary) hypertension: Secondary | ICD-10-CM | POA: Diagnosis not present

## 2021-08-04 DIAGNOSIS — M4802 Spinal stenosis, cervical region: Secondary | ICD-10-CM | POA: Diagnosis not present

## 2021-08-04 DIAGNOSIS — M25561 Pain in right knee: Secondary | ICD-10-CM | POA: Diagnosis not present

## 2021-08-04 DIAGNOSIS — M7989 Other specified soft tissue disorders: Secondary | ICD-10-CM | POA: Diagnosis not present

## 2021-08-04 DIAGNOSIS — R062 Wheezing: Secondary | ICD-10-CM | POA: Diagnosis not present

## 2021-08-04 DIAGNOSIS — S0990XA Unspecified injury of head, initial encounter: Secondary | ICD-10-CM | POA: Diagnosis not present

## 2021-08-04 LAB — BASIC METABOLIC PANEL
Anion gap: 10 (ref 5–15)
BUN: 29 mg/dL — ABNORMAL HIGH (ref 8–23)
CO2: 26 mmol/L (ref 22–32)
Calcium: 8.9 mg/dL (ref 8.9–10.3)
Chloride: 96 mmol/L — ABNORMAL LOW (ref 98–111)
Creatinine, Ser: 1.46 mg/dL — ABNORMAL HIGH (ref 0.61–1.24)
GFR, Estimated: 49 mL/min — ABNORMAL LOW (ref >60)
Glucose, Bld: 106 mg/dL — ABNORMAL HIGH (ref 70–99)
Potassium: 3.7 mmol/L (ref 3.5–5.1)
Sodium: 132 mmol/L — ABNORMAL LOW (ref 135–145)

## 2021-08-04 LAB — URINALYSIS, ROUTINE W REFLEX MICROSCOPIC
Glucose, UA: NEGATIVE mg/dL
Ketones, ur: 5 mg/dL — AB
Nitrite: NEGATIVE
Protein, ur: 30 mg/dL — AB
RBC / HPF: >50 RBC/hpf — ABNORMAL HIGH (ref 0–5)
Specific Gravity, Urine: 1.023 (ref 1.005–1.030)
WBC, UA: >50 WBC/hpf — ABNORMAL HIGH (ref 0–5)
pH: 5 (ref 5.0–8.0)

## 2021-08-04 LAB — CBC WITH DIFFERENTIAL/PLATELET
Abs Immature Granulocytes: 0.19 10*3/uL — ABNORMAL HIGH (ref 0.00–0.07)
Basophils Absolute: 0 10*3/uL (ref 0.0–0.1)
Basophils Relative: 0 %
Eosinophils Absolute: 0.2 10*3/uL (ref 0.0–0.5)
Eosinophils Relative: 3 %
HCT: 33.6 % — ABNORMAL LOW (ref 39.0–52.0)
Hemoglobin: 11 g/dL — ABNORMAL LOW (ref 13.0–17.0)
Immature Granulocytes: 2 %
Lymphocytes Relative: 6 %
Lymphs Abs: 0.5 10*3/uL — ABNORMAL LOW (ref 0.7–4.0)
MCH: 29.3 pg (ref 26.0–34.0)
MCHC: 32.7 g/dL (ref 30.0–36.0)
MCV: 89.6 fL (ref 80.0–100.0)
Monocytes Absolute: 0.7 10*3/uL (ref 0.1–1.0)
Monocytes Relative: 8 %
Neutro Abs: 6.9 10*3/uL (ref 1.7–7.7)
Neutrophils Relative %: 81 %
Platelets: 193 10*3/uL (ref 150–400)
RBC: 3.75 MIL/uL — ABNORMAL LOW (ref 4.22–5.81)
RDW: 14.6 % (ref 11.5–15.5)
WBC: 8.5 10*3/uL (ref 4.0–10.5)
nRBC: 0 % (ref 0.0–0.2)

## 2021-08-04 LAB — RESP PANEL BY RT-PCR (FLU A&B, COVID) ARPGX2
Influenza A by PCR: NEGATIVE
Influenza B by PCR: NEGATIVE
SARS Coronavirus 2 by RT PCR: NEGATIVE

## 2021-08-04 LAB — MAGNESIUM: Magnesium: 1.5 mg/dL — ABNORMAL LOW (ref 1.7–2.4)

## 2021-08-04 LAB — BRAIN NATRIURETIC PEPTIDE: B Natriuretic Peptide: 139.4 pg/mL — ABNORMAL HIGH (ref 0.0–100.0)

## 2021-08-04 LAB — CK: Total CK: 240 U/L (ref 49–397)

## 2021-08-04 MED ORDER — MAGNESIUM SULFATE 2 GM/50ML IV SOLN
2.0000 g | Freq: Once | INTRAVENOUS | Status: AC
  Administered 2021-08-04: 2 g via INTRAVENOUS
  Filled 2021-08-04: qty 50

## 2021-08-04 MED ORDER — TETANUS-DIPHTH-ACELL PERTUSSIS 5-2.5-18.5 LF-MCG/0.5 IM SUSY
0.5000 mL | PREFILLED_SYRINGE | Freq: Once | INTRAMUSCULAR | Status: AC
  Administered 2021-08-04: 0.5 mL via INTRAMUSCULAR
  Filled 2021-08-04: qty 0.5

## 2021-08-04 MED ORDER — SODIUM CHLORIDE 0.9 % IV SOLN
2.0000 g | Freq: Once | INTRAVENOUS | Status: AC
  Administered 2021-08-04: 2 g via INTRAVENOUS
  Filled 2021-08-04: qty 20

## 2021-08-04 MED ORDER — ACETAMINOPHEN 500 MG PO TABS
1000.0000 mg | ORAL_TABLET | Freq: Once | ORAL | Status: AC
  Administered 2021-08-04: 1000 mg via ORAL
  Filled 2021-08-04: qty 2

## 2021-08-04 MED ORDER — LIDOCAINE HCL (PF) 1 % IJ SOLN
10.0000 mL | Freq: Once | INTRAMUSCULAR | Status: AC
  Administered 2021-08-04: 10 mL via INTRADERMAL
  Filled 2021-08-04: qty 10

## 2021-08-04 MED ORDER — IPRATROPIUM-ALBUTEROL 0.5-2.5 (3) MG/3ML IN SOLN
3.0000 mL | Freq: Once | RESPIRATORY_TRACT | Status: AC
  Administered 2021-08-04: 3 mL via RESPIRATORY_TRACT
  Filled 2021-08-04: qty 3

## 2021-08-04 MED ORDER — CEFDINIR 300 MG PO CAPS: 300.0000 mg | ORAL_CAPSULE | Freq: Two times a day (BID) | ORAL | 0 refills | Status: AC

## 2021-08-04 NOTE — ED Provider Notes (Signed)
[facility]PART[facility] Provider Note  History   Chief Complaint  Patient presents with   Fall   [name] is a 81 y.o. 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  The history is provided by the patient, a relative and the EMS personnel.  Illness Location:  L eyebrow laceration Quality:  Unable to characterize Severity:  Unable to specify Onset quality:  Sudden Duration: Occurred PTA this morning. Timing:  Unable to specify Progression:  Unchanged Chronicity:  New Context:  Unwitnessed fall at facility.  Patient states he was trying to get up out of bed when he fell face first onto the floor.  Denies LOC.  See MDM. Associated symptoms: wheezing   Associated symptoms: no congestion, no cough, no diarrhea, no fever, no nausea, no rash, no sore throat and no vomiting     Past Medical History:  Diagnosis Date   CKD (chronic kidney disease)    DM type 2 (diabetes mellitus, type 2) (HCC)    ED (erectile dysfunction)    Fatty liver    GERD (gastroesophageal reflux disease)    Gout    H/O adenomatous polyp of colon    HLD (hyperlipidemia)    HTN (hypertension)    Hypothyroid    Panic attacks    Parkinson disease (HCC)    Psoriasis     Social History   Tobacco Use   Smoking status: Former    Types: Cigars   Smokeless tobacco: Never  Vaping Use   Vaping Use: Never used  Substance Use Topics   Alcohol use: No    Alcohol/week: 0.0 standard drinks   Drug use: No     Family History  Problem Relation Age of Onset   Hypertension Mother    Cancer Sister    Aneurysm Sister     Review of Systems  Constitutional:  Negative for chills and fever.  HENT:  Negative for congestion and sore throat.   Eyes:  Negative for photophobia and visual disturbance.  Respiratory:  Positive for wheezing.  Negative for cough.   Cardiovascular:  Negative for palpitations.  Gastrointestinal:  Negative for diarrhea, nausea and vomiting.  Endocrine: Negative.   Genitourinary:  Negative for flank pain, hematuria, scrotal swelling and testicular pain.  Musculoskeletal:  Negative for neck pain and neck stiffness.  Skin:  Negative for rash and wound.  Allergic/Immunologic: Negative.   Neurological:  Negative for seizures, syncope, facial asymmetry and light-headedness.  Hematological: Negative.   Psychiatric/Behavioral: Negative.      Physical Exam   Today's Vitals   08/04/21 0730 08/04/21 0755 08/04/21 0800 08/04/21 0828  BP: 114/75  100/61 (S) 104/81  Pulse: 80  82 86  Resp: 16  20 (S) 18  Temp:    (!) 96.5 F (35.8 C)  TempSrc:    Oral  SpO2: 99%  100% 93%  Weight:      Height:      PainSc:  0-No pain     Rectal temp 97.86F  Physical Exam:  General: Obese, no acute distress   Head: Normocephalic, 1 inch laceration along left eyebrow No skull depressions  No conjunctival hemorrhage No periorbital ecchymoses, [place] Eyes, or Battle Sign bilaterally Ears atraumatic No nasal septal deviation or hematoma  PERRL, EOMI, sclera anicteric. Mucus membranes moist.       Neck: Supple, trachea midline No  TTP over midline cervical spine, no step offs or deformities.    Cardiovascular: RATE: 86 RHYTHM: regular 2+ radial, femoral, DP pulses bilaterally   Respiratory/Chest Wall: Lungs with bilateral wheezing and prolonged expiratory phase No hypoxia, tachypnea, or respiratory distress Clavicles stable to compression Chest stable to AP and lateral compression, no chest wall TTP, no crepitus   Extremities: Cool extremities, 2+ radial/lower extremity pulses Pitting edema in RUE, bilateral LE Abrasion along bilateral knees and left elbow TTP along left shoulder, left elbow, bilateral knees   Gastrointestinal: Abdomen soft, nontender, non-distended FAST performed: No   Neurologic:  Awake/alert Speech mildly dysarthric (which is his documented baseline on recent chart review) EOMI, PERRL Moves all 4 extremities Appropriate muscle tone Sensation intact in all 4 extremities   Genitourinary: Normal male, urinary Foley in place   Skin: Scattered abrasions as above   Glasgow Coma Scale: GCS 15   Rectal: Deferred   Spine: No TTP along midline C/T/L spine, no step offs or deformities    Other:      ED Course  .Marland KitchenLaceration Repair  Date/Time: 08/04/2021 11:28 AM Performed by: Wynetta Fines, MD Authorized by: Lajean Saver, MD   Consent:    Consent obtained:  Verbal   Consent given by:  Patient   Risks discussed:  Infection, pain, need for additional repair, poor cosmetic result and poor wound healing   Alternatives discussed:  No treatment and delayed treatment Universal protocol:    Patient identity confirmed:  Verbally with patient and arm band Anesthesia:    Anesthesia method:  Local infiltration   Local anesthetic:  Lidocaine 1% w/o epi Laceration details:    Location: Forehead / L eyebrow.   Length (cm):  3 Pre-procedure details:    Preparation:  Patient was prepped and draped in usual sterile fashion Exploration:    Hemostasis achieved with:  Direct pressure   Imaging obtained comment:  CT   Imaging outcome: foreign body not noted   Treatment:    Area cleansed with:  Povidone-iodine and saline   Amount of cleaning:  Standard   Irrigation solution:  Sterile saline   Irrigation volume:  30   Irrigation method:  Syringe   Visualized foreign bodies/material removed: no     Undermining:  None Skin repair:    Repair method:  Sutures   Suture size:  3-0   Suture material:  Prolene   Number of sutures:  4 Approximation:    Approximation:  Loose Repair type:    Repair type:  Simple Post-procedure details:    Dressing:  Antibiotic ointment   Procedure completion:  Tolerated      Medical Decision Making:  [name] is a 81 y.o. male  w/ h/o A-fib (on Eliquis), chronic systolic HF (EF 74-25%), s/p pacemaker, T2DM, CKD2, HTN, HLD, hypothyroidism, Parkinson's who p/w EMS and unlevel trauma s/p unwitnessed fall at facility.  History obtained from patient, EMS, and patient's daughter. Briefly, the patient was attempting to get out of bed when he sustained an unwitnessed fall.  Patient states he fell face first onto the floor, denies LOC.  Facility found the patient had a left eyebrow laceration and bilateral knee abrasions prompting EMS transport for ED evaluation.   Currently, patient is awake, alert, and protecting own airway and is hemodynamically stable.  Spoke with Lynelle Smoke (daughter of patient) who stated that the facility called this morning stating that the patient "tried to get up" and sustained an unwitnessed fall wherein he struck his forehead.  He denied LOC, no seizure-like activity, no preceding complaints.  Given his bilateral knee abrasions and his left eyebrow laceration and fall on thinners, facility made a decision to call EMS to transport to ED for evaluation.  Tammy states that at the end of January the patient had "a lot of medication changes" where he was taken off Ativan, started on new antianxiety medication, started on Parkinson's medication, and arranged with outpatient follow-up to manage Parkinson medication (which is at the beginning of May).  Since this time, Tammy states that the patient has had inability to ambulate safely and change in speech which is well documented on recent chart review (dysarthria).  Tammy states it has not gotten worse since this time, it just "seems to be his new normal" and "is usually worse in the morning." Tammy states that the patient knows that he is not supposed to sit on the side of the bed or ambulate by himself because he is an increased fall risk so she "does not understand why he tried to get up this morning."  Daughter wonders if he might have a urinary tract  infection.  Examination as above. Given fall on blood thinners, will obtain CT head and CT cervical spine We will obtain various x-rays of extremities with abrasions/bruising, see below We will obtain lab work as below including UA to evaluate for UTI.  ER provider interpretation of Imaging / Radiology:  CXR: Low lung volumes with atelectasis and/or consolidation in the left lower lobe and small left-sided pleural effusion.  Mild cardiomegaly. XR L shoulder: No acute fracture, subluxation, or dislocation XR L elbow: No acute fracture, subluxation, or dislocation XR L knee: No acute fracture or malalignment XR R knee: No acute fracture or traumatic malalignment  CT H: Left forehead soft tissue injury without underlying skull fracture.  No acute intracranial abnormality. CT C-spine: No acute traumatic injury identified in the cervical spine  ER provider interpretation of EKG:  Electronic ventricular pacemaker (appears similar to previous EKG)  ER provider interpretation of Labs:  CBC: No leukocytosis, Hgb 11.0 (from 9.5 on 2/28) BMP: Na 132, K 3.7, BG 106, CO2 26, BUN 29, Cr 1.46 (bl 1.5) Mg: 1.5 BNP: 139.4 (205 on 1/30) CK: 240 UA: Cloudy appearance, moderate blood, negative nitrite, moderate leukocyte, >50 WBC, rare bacteria - c/w likely UTI Covid/Flu: Negative  Key medications administered in the ER:  Medications  ipratropium-albuterol (DUONEB) 0.5-2.5 (3) MG/3ML nebulizer solution 3 mL (3 mLs Nebulization Given 08/04/21 0935)  lidocaine (PF) (XYLOCAINE) 1 % injection 10 mL (10 mLs Intradermal Given by Other 08/04/21 0926)  Tdap (BOOSTRIX) injection 0.5 mL (0.5 mLs Intramuscular Given 08/04/21 0927)  magnesium sulfate IVPB 2 g 50 mL (0 g Intravenous Stopped 08/04/21 1029)  cefTRIAXone (ROCEPHIN) 2 g in sodium chloride 0.9 % 100 mL IVPB (0 g Intravenous Stopped 08/04/21 1411)   Diagnoses considered: Etiology likely UTI (ordinarily the patient would not try to get out of bed). Do not  suspect SDH, EDH, SAH, or other ICH, nor do I suspect other intracranial etiology as there are no focal neurologic findings. I have considered retrobulbar hematoma and orbital fracture. Patient has no peri- or retro-orbital pain. EOMI without entrapment. Negative paresthesias of face, no proptosis, nor is there change in vision.  Consulted: Not indicated  Patient with improved wheezing s/p DuoNeb. Tdap Updated in the ED Mg repleted in the ED S/p Rocephin for UTI, plan to discharge with [facility] Laceration repair as above, spoke with patient and daughter  of patient instructing them to have sutures removed in 5-7 days by PCP.  Close return precautions given regarding wound. On serial reexamination, patient remains with unchanging neurologic examination.  No new focal deficits. Patient to follow-up with PCP in 2 days, close return precautions given Spoke with daughter of patient at bedside, all questions answered at the time of discharge  Patient seen in conjunction with Dr. Nicola Girt medical dictation software was used in the creation of this note.   Electronically signed by: Wynetta Fines, MD on 08/04/2021 at 7:30 AM  Clinical Impression:  1. Fall     Dispo: Discharge    Wynetta Fines, MD 08/05/21 5848    Lajean Saver, MD 08/05/21 [phone]

## 2021-08-04 NOTE — ED Triage Notes (Signed)
Pt brought in by EMS from [facility] for an unwitnessed fall. Pt has a 1 inch laceration over his left eyebrow. Pt takes eliquis. ?

## 2021-08-04 NOTE — ED Notes (Signed)
E-signature pad unavailable at time of pt discharge. This RN discussed discharge materials with pt and answered all pt questions. Pt stated understanding of discharge material. ? ?

## 2021-08-04 NOTE — Discharge Instructions (Addendum)
[name] sustained a fall.  The trauma imaging did not reveal any acute fracture or any brain bleed.  We repaired his left eyebrow laceration at bedside, he will need to follow-up with his primary doctor in 5-7 days for removal of 4 sutures.  Return to the ED if the area begins draining pus, the patient develops fever, or redness around the site. ? ?Additionally, we exchanged his urinary Foley catheter and tested a clean sample of his urine which showed a urinary tract infection.  We gave him a dose of IV antibiotics in the ED and plan to send him home on a course of oral antibiotics. ? ?His left arm is swollen, we suspect this may be superficial thrombophlebitis from his recent IV site recommend cool compress as needed.   ? ?Follow-up with his regular doctor in 2 days for post ED follow-up visit ?

## 2021-08-04 NOTE — ED Notes (Signed)
PTAR at pt bedside ?

## 2021-08-04 NOTE — ED Notes (Signed)
Trauma Response Nurse Documentation ? ? ?[name] is a 81 y.o. male arriving to [facility] ED via EMS ? ?On Eliquis (apixaban) daily. Trauma was activated as a Level 2 by Charge nurse based on the following trauma criteria Elderly patients > 65 with head trauma on anti-coagulation (excluding ASA). Trauma team at the bedside on patient arrival. Patient cleared for CT by Dr. Ashok Cordia. Patient to CT with TRN at 0800. GCS 15. ? ?History  ? Past Medical History:  ?Diagnosis Date  ? CKD (chronic kidney disease)   ? DM type 2 (diabetes mellitus, type 2) ([place])   ? ED (erectile dysfunction)   ? Fatty liver   ? GERD (gastroesophageal reflux disease)   ? Gout   ? H/O adenomatous polyp of colon   ? HLD (hyperlipidemia)   ? HTN (hypertension)   ? Hypothyroid   ? Panic attacks   ? Parkinson disease ([place])   ? Psoriasis   ?  ? Past Surgical History:  ?Procedure Laterality Date  ? BOTOX INJECTION  06/05/2021  ? Procedure: BOTOX INJECTION;  Surgeon: Clarene Essex, MD;  Location: [facility];  Service: Endoscopy;;  ? ESOPHAGOGASTRODUODENOSCOPY (EGD) WITH PROPOFOL N/A 06/05/2021  ? Procedure: ESOPHAGOGASTRODUODENOSCOPY (EGD) WITH PROPOFOL;  Surgeon: Clarene Essex, MD;  Location: [facility];  Service: Endoscopy;  Laterality: N/A;  POSSIBLE DIL/POSSIBLE BOTOX  ? ESOPHAGOGASTRODUODENOSCOPY (EGD) WITH PROPOFOL N/A 07/31/2021  ? Procedure: ESOPHAGOGASTRODUODENOSCOPY (EGD) WITH PROPOFOL;  Surgeon: Otis Brace, MD;  Location: [facility];  Service: Gastroenterology;  Laterality: N/A;  ? PACEMAKER IMPLANT N/A 11/24/2017  ? Procedure: PACEMAKER IMPLANT;  Surgeon: Deboraha Sprang, MD;  Location: [facility];  Service: Cardiovascular;  Laterality: N/A;  ?  ? ? ? ?Initial Focused Assessment (If applicable, or please see trauma documentation): ? ?Pt already in department when I arrived. From nursing home, in gown, laceration to forehead above left eye, multiple bruises to extremities- in various stages of healing. Has been seen for multiple  falls in ED recently.  ?Mouth swabbed with water- tongue appears very dry. Is able to answer all questions.  ? ? ?CT's Completed:   ?CT Head CT C-spine ? ?Interventions:  ?Xrays ?CT scans ?Labs ?Replaced foley ? ? ? ?[name]  ?Trauma Response RN ? ?Please call TRN at [phone] for further assistance. ?  ?

## 2021-08-04 NOTE — ED Notes (Signed)
To CT with TRN ?

## 2021-08-06 DIAGNOSIS — N390 Urinary tract infection, site not specified: Secondary | ICD-10-CM | POA: Diagnosis not present

## 2021-08-06 DIAGNOSIS — I5020 Unspecified systolic (congestive) heart failure: Secondary | ICD-10-CM | POA: Diagnosis not present

## 2021-08-06 DIAGNOSIS — I10 Essential (primary) hypertension: Secondary | ICD-10-CM | POA: Diagnosis not present

## 2021-08-06 DIAGNOSIS — I4891 Unspecified atrial fibrillation: Secondary | ICD-10-CM | POA: Diagnosis not present

## 2021-08-06 DIAGNOSIS — W19XXXD Unspecified fall, subsequent encounter: Secondary | ICD-10-CM | POA: Diagnosis not present

## 2021-08-06 LAB — URINE CULTURE: Culture: NO GROWTH

## 2021-08-09 DIAGNOSIS — L89153 Pressure ulcer of sacral region, stage 3: Secondary | ICD-10-CM | POA: Diagnosis not present

## 2021-08-14 DIAGNOSIS — I4891 Unspecified atrial fibrillation: Secondary | ICD-10-CM | POA: Diagnosis not present

## 2021-08-14 DIAGNOSIS — I5020 Unspecified systolic (congestive) heart failure: Secondary | ICD-10-CM | POA: Diagnosis not present

## 2021-08-14 DIAGNOSIS — G629 Polyneuropathy, unspecified: Secondary | ICD-10-CM | POA: Diagnosis not present

## 2021-08-14 DIAGNOSIS — I10 Essential (primary) hypertension: Secondary | ICD-10-CM | POA: Diagnosis not present

## 2021-08-14 DIAGNOSIS — N390 Urinary tract infection, site not specified: Secondary | ICD-10-CM | POA: Diagnosis not present

## 2021-08-16 DIAGNOSIS — L89153 Pressure ulcer of sacral region, stage 3: Secondary | ICD-10-CM | POA: Diagnosis not present

## 2021-08-21 DIAGNOSIS — I5020 Unspecified systolic (congestive) heart failure: Secondary | ICD-10-CM | POA: Diagnosis not present

## 2021-08-21 DIAGNOSIS — F419 Anxiety disorder, unspecified: Secondary | ICD-10-CM | POA: Diagnosis not present

## 2021-08-21 DIAGNOSIS — G20 Parkinson's disease: Secondary | ICD-10-CM | POA: Diagnosis not present

## 2021-08-21 DIAGNOSIS — I10 Essential (primary) hypertension: Secondary | ICD-10-CM | POA: Diagnosis not present

## 2021-08-21 DIAGNOSIS — I4891 Unspecified atrial fibrillation: Secondary | ICD-10-CM | POA: Diagnosis not present

## 2021-08-22 ENCOUNTER — Ambulatory Visit (INDEPENDENT_AMBULATORY_CARE_PROVIDER_SITE_OTHER): Payer: Medicare HMO

## 2021-08-22 DIAGNOSIS — I442 Atrioventricular block, complete: Secondary | ICD-10-CM

## 2021-08-23 DIAGNOSIS — L89153 Pressure ulcer of sacral region, stage 3: Secondary | ICD-10-CM | POA: Diagnosis not present

## 2021-08-23 LAB — CUP PACEART REMOTE DEVICE CHECK
Battery Remaining Longevity: 59 mo
Battery Remaining Percentage: 61 %
Battery Voltage: 3.01 V
Brady Statistic AP VP Percent: 10 %
Brady Statistic AP VS Percent: 1 %
Brady Statistic AS VP Percent: 89 %
Brady Statistic AS VS Percent: 1 %
Brady Statistic RA Percent Paced: 9.5 %
Brady Statistic RV Percent Paced: 99 %
Date Time Interrogation Session: 20230322123537
Implantable Lead Implant Date: 20190624
Implantable Lead Implant Date: 20190624
Implantable Lead Location: 753859
Implantable Lead Location: 753860
Implantable Lead Model: 5076
Implantable Lead Model: 5076
Implantable Pulse Generator Implant Date: 20190624
Lead Channel Impedance Value: 400 Ohm
Lead Channel Impedance Value: 400 Ohm
Lead Channel Pacing Threshold Amplitude: 0.75 V
Lead Channel Pacing Threshold Amplitude: 0.75 V
Lead Channel Pacing Threshold Pulse Width: 0.5 ms
Lead Channel Pacing Threshold Pulse Width: 0.5 ms
Lead Channel Sensing Intrinsic Amplitude: 12 mV
Lead Channel Sensing Intrinsic Amplitude: 2.1 mV
Lead Channel Setting Pacing Amplitude: 2 V
Lead Channel Setting Pacing Amplitude: 2.5 V
Lead Channel Setting Pacing Pulse Width: 0.5 ms
Lead Channel Setting Sensing Sensitivity: 2 mV
Pulse Gen Model: 2272
Pulse Gen Serial Number: 9033095

## 2021-08-24 ENCOUNTER — Ambulatory Visit: Payer: Medicare HMO | Admitting: Cardiovascular Disease

## 2021-08-24 DIAGNOSIS — F419 Anxiety disorder, unspecified: Secondary | ICD-10-CM | POA: Diagnosis not present

## 2021-08-24 DIAGNOSIS — F39 Unspecified mood [affective] disorder: Secondary | ICD-10-CM | POA: Diagnosis not present

## 2021-08-30 DIAGNOSIS — I10 Essential (primary) hypertension: Secondary | ICD-10-CM | POA: Diagnosis not present

## 2021-08-30 DIAGNOSIS — I4891 Unspecified atrial fibrillation: Secondary | ICD-10-CM | POA: Diagnosis not present

## 2021-08-30 DIAGNOSIS — F419 Anxiety disorder, unspecified: Secondary | ICD-10-CM | POA: Diagnosis not present

## 2021-08-30 DIAGNOSIS — I5020 Unspecified systolic (congestive) heart failure: Secondary | ICD-10-CM | POA: Diagnosis not present

## 2021-09-01 DIAGNOSIS — M1990 Unspecified osteoarthritis, unspecified site: Secondary | ICD-10-CM | POA: Diagnosis not present

## 2021-09-01 DIAGNOSIS — I5020 Unspecified systolic (congestive) heart failure: Secondary | ICD-10-CM | POA: Diagnosis not present

## 2021-09-01 DIAGNOSIS — M6281 Muscle weakness (generalized): Secondary | ICD-10-CM | POA: Diagnosis not present

## 2021-09-01 DIAGNOSIS — G629 Polyneuropathy, unspecified: Secondary | ICD-10-CM | POA: Diagnosis not present

## 2021-09-01 DIAGNOSIS — G20 Parkinson's disease: Secondary | ICD-10-CM | POA: Diagnosis not present

## 2021-09-01 DIAGNOSIS — I10 Essential (primary) hypertension: Secondary | ICD-10-CM | POA: Diagnosis not present

## 2021-09-01 DIAGNOSIS — I4891 Unspecified atrial fibrillation: Secondary | ICD-10-CM | POA: Diagnosis not present

## 2021-09-01 DIAGNOSIS — F419 Anxiety disorder, unspecified: Secondary | ICD-10-CM | POA: Diagnosis not present

## 2021-09-02 DIAGNOSIS — N390 Urinary tract infection, site not specified: Secondary | ICD-10-CM | POA: Diagnosis not present

## 2021-09-03 DIAGNOSIS — I5030 Unspecified diastolic (congestive) heart failure: Secondary | ICD-10-CM | POA: Diagnosis not present

## 2021-09-03 DIAGNOSIS — I4891 Unspecified atrial fibrillation: Secondary | ICD-10-CM | POA: Diagnosis not present

## 2021-09-03 DIAGNOSIS — N183 Chronic kidney disease, stage 3 unspecified: Secondary | ICD-10-CM | POA: Diagnosis not present

## 2021-09-03 DIAGNOSIS — F0390 Unspecified dementia without behavioral disturbance: Secondary | ICD-10-CM | POA: Diagnosis not present

## 2021-09-04 DIAGNOSIS — N390 Urinary tract infection, site not specified: Secondary | ICD-10-CM | POA: Diagnosis not present

## 2021-09-05 ENCOUNTER — Encounter: Payer: Self-pay | Admitting: Neurology

## 2021-09-05 DIAGNOSIS — G20 Parkinson's disease: Secondary | ICD-10-CM | POA: Diagnosis not present

## 2021-09-05 DIAGNOSIS — I5020 Unspecified systolic (congestive) heart failure: Secondary | ICD-10-CM | POA: Diagnosis not present

## 2021-09-05 DIAGNOSIS — I10 Essential (primary) hypertension: Secondary | ICD-10-CM | POA: Diagnosis not present

## 2021-09-05 DIAGNOSIS — I4891 Unspecified atrial fibrillation: Secondary | ICD-10-CM | POA: Diagnosis not present

## 2021-09-05 DIAGNOSIS — N390 Urinary tract infection, site not specified: Secondary | ICD-10-CM | POA: Diagnosis not present

## 2021-09-05 NOTE — Progress Notes (Signed)
Remote pacemaker transmission.   

## 2021-09-06 DIAGNOSIS — I10 Essential (primary) hypertension: Secondary | ICD-10-CM | POA: Diagnosis not present

## 2021-09-06 DIAGNOSIS — L98419 Non-pressure chronic ulcer of buttock with unspecified severity: Secondary | ICD-10-CM | POA: Diagnosis not present

## 2021-09-07 DIAGNOSIS — F0390 Unspecified dementia without behavioral disturbance: Secondary | ICD-10-CM | POA: Diagnosis not present

## 2021-09-07 DIAGNOSIS — N183 Chronic kidney disease, stage 3 unspecified: Secondary | ICD-10-CM | POA: Diagnosis not present

## 2021-09-07 DIAGNOSIS — I5030 Unspecified diastolic (congestive) heart failure: Secondary | ICD-10-CM | POA: Diagnosis not present

## 2021-09-07 DIAGNOSIS — I4891 Unspecified atrial fibrillation: Secondary | ICD-10-CM | POA: Diagnosis not present

## 2021-09-08 DIAGNOSIS — I10 Essential (primary) hypertension: Secondary | ICD-10-CM | POA: Diagnosis not present

## 2021-09-08 DIAGNOSIS — I480 Paroxysmal atrial fibrillation: Secondary | ICD-10-CM | POA: Diagnosis not present

## 2021-09-08 DIAGNOSIS — E039 Hypothyroidism, unspecified: Secondary | ICD-10-CM | POA: Diagnosis not present

## 2021-09-08 DIAGNOSIS — G4733 Obstructive sleep apnea (adult) (pediatric): Secondary | ICD-10-CM | POA: Diagnosis not present

## 2021-09-08 DIAGNOSIS — K220 Achalasia of cardia: Secondary | ICD-10-CM | POA: Diagnosis not present

## 2021-09-08 DIAGNOSIS — F419 Anxiety disorder, unspecified: Secondary | ICD-10-CM | POA: Diagnosis not present

## 2021-09-08 DIAGNOSIS — G9341 Metabolic encephalopathy: Secondary | ICD-10-CM | POA: Diagnosis not present

## 2021-09-08 DIAGNOSIS — N179 Acute kidney failure, unspecified: Secondary | ICD-10-CM | POA: Diagnosis not present

## 2021-09-08 DIAGNOSIS — I5022 Chronic systolic (congestive) heart failure: Secondary | ICD-10-CM | POA: Diagnosis not present

## 2021-09-09 DIAGNOSIS — G4733 Obstructive sleep apnea (adult) (pediatric): Secondary | ICD-10-CM | POA: Diagnosis not present

## 2021-09-09 DIAGNOSIS — F419 Anxiety disorder, unspecified: Secondary | ICD-10-CM | POA: Diagnosis not present

## 2021-09-09 DIAGNOSIS — E039 Hypothyroidism, unspecified: Secondary | ICD-10-CM | POA: Diagnosis not present

## 2021-09-09 DIAGNOSIS — I10 Essential (primary) hypertension: Secondary | ICD-10-CM | POA: Diagnosis not present

## 2021-09-09 DIAGNOSIS — K220 Achalasia of cardia: Secondary | ICD-10-CM | POA: Diagnosis not present

## 2021-09-09 DIAGNOSIS — G9341 Metabolic encephalopathy: Secondary | ICD-10-CM | POA: Diagnosis not present

## 2021-09-09 DIAGNOSIS — I5022 Chronic systolic (congestive) heart failure: Secondary | ICD-10-CM | POA: Diagnosis not present

## 2021-09-09 DIAGNOSIS — N179 Acute kidney failure, unspecified: Secondary | ICD-10-CM | POA: Diagnosis not present

## 2021-09-09 DIAGNOSIS — I480 Paroxysmal atrial fibrillation: Secondary | ICD-10-CM | POA: Diagnosis not present

## 2021-09-10 DIAGNOSIS — E039 Hypothyroidism, unspecified: Secondary | ICD-10-CM | POA: Diagnosis not present

## 2021-09-10 DIAGNOSIS — K220 Achalasia of cardia: Secondary | ICD-10-CM | POA: Diagnosis not present

## 2021-09-10 DIAGNOSIS — I480 Paroxysmal atrial fibrillation: Secondary | ICD-10-CM | POA: Diagnosis not present

## 2021-09-10 DIAGNOSIS — I10 Essential (primary) hypertension: Secondary | ICD-10-CM | POA: Diagnosis not present

## 2021-09-10 DIAGNOSIS — G9341 Metabolic encephalopathy: Secondary | ICD-10-CM | POA: Diagnosis not present

## 2021-09-10 DIAGNOSIS — N179 Acute kidney failure, unspecified: Secondary | ICD-10-CM | POA: Diagnosis not present

## 2021-09-10 DIAGNOSIS — I5022 Chronic systolic (congestive) heart failure: Secondary | ICD-10-CM | POA: Diagnosis not present

## 2021-09-10 DIAGNOSIS — G4733 Obstructive sleep apnea (adult) (pediatric): Secondary | ICD-10-CM | POA: Diagnosis not present

## 2021-09-10 DIAGNOSIS — F419 Anxiety disorder, unspecified: Secondary | ICD-10-CM | POA: Diagnosis not present

## 2021-09-11 DIAGNOSIS — F419 Anxiety disorder, unspecified: Secondary | ICD-10-CM | POA: Diagnosis not present

## 2021-09-11 DIAGNOSIS — N179 Acute kidney failure, unspecified: Secondary | ICD-10-CM | POA: Diagnosis not present

## 2021-09-11 DIAGNOSIS — G4733 Obstructive sleep apnea (adult) (pediatric): Secondary | ICD-10-CM | POA: Diagnosis not present

## 2021-09-11 DIAGNOSIS — I10 Essential (primary) hypertension: Secondary | ICD-10-CM | POA: Diagnosis not present

## 2021-09-11 DIAGNOSIS — E039 Hypothyroidism, unspecified: Secondary | ICD-10-CM | POA: Diagnosis not present

## 2021-09-11 DIAGNOSIS — I480 Paroxysmal atrial fibrillation: Secondary | ICD-10-CM | POA: Diagnosis not present

## 2021-09-11 DIAGNOSIS — I5022 Chronic systolic (congestive) heart failure: Secondary | ICD-10-CM | POA: Diagnosis not present

## 2021-09-11 DIAGNOSIS — K220 Achalasia of cardia: Secondary | ICD-10-CM | POA: Diagnosis not present

## 2021-09-11 DIAGNOSIS — G9341 Metabolic encephalopathy: Secondary | ICD-10-CM | POA: Diagnosis not present

## 2021-09-12 DIAGNOSIS — F419 Anxiety disorder, unspecified: Secondary | ICD-10-CM | POA: Diagnosis not present

## 2021-09-12 DIAGNOSIS — K220 Achalasia of cardia: Secondary | ICD-10-CM | POA: Diagnosis not present

## 2021-09-12 DIAGNOSIS — I480 Paroxysmal atrial fibrillation: Secondary | ICD-10-CM | POA: Diagnosis not present

## 2021-09-12 DIAGNOSIS — G4733 Obstructive sleep apnea (adult) (pediatric): Secondary | ICD-10-CM | POA: Diagnosis not present

## 2021-09-12 DIAGNOSIS — I5022 Chronic systolic (congestive) heart failure: Secondary | ICD-10-CM | POA: Diagnosis not present

## 2021-09-12 DIAGNOSIS — E039 Hypothyroidism, unspecified: Secondary | ICD-10-CM | POA: Diagnosis not present

## 2021-09-12 DIAGNOSIS — I10 Essential (primary) hypertension: Secondary | ICD-10-CM | POA: Diagnosis not present

## 2021-09-12 DIAGNOSIS — G9341 Metabolic encephalopathy: Secondary | ICD-10-CM | POA: Diagnosis not present

## 2021-09-12 DIAGNOSIS — N179 Acute kidney failure, unspecified: Secondary | ICD-10-CM | POA: Diagnosis not present

## 2021-09-13 DIAGNOSIS — I480 Paroxysmal atrial fibrillation: Secondary | ICD-10-CM | POA: Diagnosis not present

## 2021-09-13 DIAGNOSIS — L98419 Non-pressure chronic ulcer of buttock with unspecified severity: Secondary | ICD-10-CM | POA: Diagnosis not present

## 2021-09-13 DIAGNOSIS — F419 Anxiety disorder, unspecified: Secondary | ICD-10-CM | POA: Diagnosis not present

## 2021-09-13 DIAGNOSIS — I5022 Chronic systolic (congestive) heart failure: Secondary | ICD-10-CM | POA: Diagnosis not present

## 2021-09-13 DIAGNOSIS — G4733 Obstructive sleep apnea (adult) (pediatric): Secondary | ICD-10-CM | POA: Diagnosis not present

## 2021-09-13 DIAGNOSIS — I5020 Unspecified systolic (congestive) heart failure: Secondary | ICD-10-CM | POA: Diagnosis not present

## 2021-09-13 DIAGNOSIS — K220 Achalasia of cardia: Secondary | ICD-10-CM | POA: Diagnosis not present

## 2021-09-13 DIAGNOSIS — G629 Polyneuropathy, unspecified: Secondary | ICD-10-CM | POA: Diagnosis not present

## 2021-09-13 DIAGNOSIS — I10 Essential (primary) hypertension: Secondary | ICD-10-CM | POA: Diagnosis not present

## 2021-09-13 DIAGNOSIS — N179 Acute kidney failure, unspecified: Secondary | ICD-10-CM | POA: Diagnosis not present

## 2021-09-13 DIAGNOSIS — E039 Hypothyroidism, unspecified: Secondary | ICD-10-CM | POA: Diagnosis not present

## 2021-09-13 DIAGNOSIS — G9341 Metabolic encephalopathy: Secondary | ICD-10-CM | POA: Diagnosis not present

## 2021-09-13 DIAGNOSIS — I4891 Unspecified atrial fibrillation: Secondary | ICD-10-CM | POA: Diagnosis not present

## 2021-09-14 ENCOUNTER — Encounter (HOSPITAL_COMMUNITY): Payer: Self-pay

## 2021-09-14 ENCOUNTER — Other Ambulatory Visit: Payer: Self-pay

## 2021-09-14 ENCOUNTER — Emergency Department (HOSPITAL_COMMUNITY)
Admission: EM | Admit: 2021-09-14 | Discharge: 2021-09-14 | Disposition: A | Discharge status: Skilled Nursing Facility | Payer: Medicare HMO | Attending: Student | Admitting: Student

## 2021-09-14 DIAGNOSIS — I10 Essential (primary) hypertension: Secondary | ICD-10-CM | POA: Diagnosis not present

## 2021-09-14 DIAGNOSIS — Z7901 Long term (current) use of anticoagulants: Secondary | ICD-10-CM | POA: Diagnosis not present

## 2021-09-14 DIAGNOSIS — Z7401 Bed confinement status: Secondary | ICD-10-CM | POA: Diagnosis not present

## 2021-09-14 DIAGNOSIS — N189 Chronic kidney disease, unspecified: Secondary | ICD-10-CM | POA: Diagnosis not present

## 2021-09-14 DIAGNOSIS — R531 Weakness: Secondary | ICD-10-CM | POA: Diagnosis not present

## 2021-09-14 DIAGNOSIS — Z79899 Other long term (current) drug therapy: Secondary | ICD-10-CM | POA: Diagnosis not present

## 2021-09-14 DIAGNOSIS — Y69 Unspecified misadventure during surgical and medical care: Secondary | ICD-10-CM | POA: Insufficient documentation

## 2021-09-14 DIAGNOSIS — R319 Hematuria, unspecified: Secondary | ICD-10-CM | POA: Diagnosis present

## 2021-09-14 DIAGNOSIS — I4891 Unspecified atrial fibrillation: Secondary | ICD-10-CM | POA: Diagnosis not present

## 2021-09-14 DIAGNOSIS — N390 Urinary tract infection, site not specified: Secondary | ICD-10-CM | POA: Diagnosis not present

## 2021-09-14 DIAGNOSIS — N368 Other specified disorders of urethra: Secondary | ICD-10-CM | POA: Diagnosis not present

## 2021-09-14 DIAGNOSIS — T839XXA Unspecified complication of genitourinary prosthetic device, implant and graft, initial encounter: Secondary | ICD-10-CM

## 2021-09-14 DIAGNOSIS — T83091A Other mechanical complication of indwelling urethral catheter, initial encounter: Secondary | ICD-10-CM | POA: Insufficient documentation

## 2021-09-14 DIAGNOSIS — I5020 Unspecified systolic (congestive) heart failure: Secondary | ICD-10-CM | POA: Diagnosis not present

## 2021-09-14 DIAGNOSIS — G20 Parkinson's disease: Secondary | ICD-10-CM | POA: Diagnosis not present

## 2021-09-14 DIAGNOSIS — E1122 Type 2 diabetes mellitus with diabetic chronic kidney disease: Secondary | ICD-10-CM | POA: Insufficient documentation

## 2021-09-14 DIAGNOSIS — R338 Other retention of urine: Secondary | ICD-10-CM | POA: Diagnosis not present

## 2021-09-14 DIAGNOSIS — T83098A Other mechanical complication of other indwelling urethral catheter, initial encounter: Secondary | ICD-10-CM | POA: Diagnosis not present

## 2021-09-14 NOTE — Consult Note (Signed)
Urology Consult  ? ?Physician requesting consult: Teressa Lower, MD ? ?Reason for consult: Difficult Foley catheter  ? ?History of Present Illness: [name] is a 81 y.o. 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  He has multiple multiple medical comorbidities as outlined below.  The patient had his Foley catheter exchanged at his nursing facility last night, but apparently the Foley catheter balloon was inflated and his prostatic urethra causing significant discomfort and blood per urethra.  The emergency room team attempted to replace a Foley catheter, but was unsuccessful. ? ? ? ?Past Medical History:  ?Diagnosis Date  ? CKD (chronic kidney disease)   ? DM type 2 (diabetes mellitus, type 2) ([place])   ? ED (erectile dysfunction)   ? Fatty liver   ? GERD (gastroesophageal reflux disease)   ? Gout   ? H/O adenomatous polyp of colon   ? HLD (hyperlipidemia)   ? HTN (hypertension)   ? Hypothyroid   ? Panic attacks   ? Parkinson disease ([place])   ? Psoriasis   ? ? ?Past Surgical History:  ?Procedure Laterality Date  ? BOTOX INJECTION  06/05/2021  ? Procedure: BOTOX INJECTION;  Surgeon: Clarene Essex, MD;  Location: [facility];  Service: Endoscopy;;  ? ESOPHAGOGASTRODUODENOSCOPY (EGD) WITH PROPOFOL N/A 06/05/2021  ? Procedure: ESOPHAGOGASTRODUODENOSCOPY (EGD) WITH PROPOFOL;  Surgeon: Clarene Essex, MD;  Location: [facility];  Service: Endoscopy;  Laterality: N/A;  POSSIBLE DIL/POSSIBLE BOTOX  ? ESOPHAGOGASTRODUODENOSCOPY (EGD) WITH PROPOFOL N/A 07/31/2021  ? Procedure: ESOPHAGOGASTRODUODENOSCOPY (EGD) WITH PROPOFOL;  Surgeon: Otis Brace, MD;  Location: [facility];  Service: Gastroenterology;  Laterality: N/A;  ? PACEMAKER IMPLANT N/A 11/24/2017  ? Procedure: PACEMAKER IMPLANT;  Surgeon: Deboraha Sprang, MD;  Location: [facility] [facility];  Service: Cardiovascular;  Laterality: N/A;  ? ? ?Current Hospital Medications: ? ?Home Meds:  ?No outpatient medications have  been marked as taking for the 09/14/21 encounter [name] Encounter).  ? ? ?Scheduled Meds: ?Continuous Infusions: ?PRN Meds:. ? ?Allergies:  ?Allergies  ?Allergen Reactions  ? Hydrochlorothiazide Other (See Comments)  ?  "gout flares"  ? Lexapro [Escitalopram] Other (See Comments)  ?  Ineffective for anxiety (10/2011)  ? Lisinopril Swelling and Other (See Comments)  ?  Angioedema ?  ? Silodosin Diarrhea  ? Tamsulosin Other (See Comments)  ?  Visual changes  ? ? ?Family History  ?Problem Relation Age of Onset  ? Hypertension Mother   ? Cancer Sister   ? Aneurysm Sister   ? ? ?Social History:  reports that he has quit smoking. His smoking use included cigars. He has never used smokeless tobacco. He reports that he does not drink alcohol and does not use drugs. ? ?ROS: ?A complete review of systems was performed.  All systems are negative except for pertinent findings as noted. ? ?Physical Exam:  ?Vital signs in last 24 hours: ?Temp:  [98.4 ?F (36.9 ?C)] 98.4 ?F (36.9 ?C) (04/14 1254) ?Pulse Rate:  [72-76] 76 (04/14 1400) ?Resp:  [19-20] 19 (04/14 1400) ?BP: (105-125)/(66) 125/66 (04/14 1400) ?SpO2:  [97 %-98 %] 97 % (04/14 1400) ?Weight:  [118.8 kg] 118.8 kg (04/14 1255) ? ?Constitutional:  Alert and oriented, Very uncomfortable ?GI: Obese Abdomen is soft, nontender, nondistended, no abdominal masses ?GU: Significant amount of blood is seen coming from the urethral meatus.  The penis is uncircumcised.  Both testicles are descended and exhibit no mass or lesionNo CVA tenderness ? ? ?Laboratory Data:  ?No  results for input(s): WBC, HGB, HCT, PLT in the last 72 hours. ? ?No results for input(s): NA, K, CL, GLUCOSE, BUN, CALCIUM, CREATININE in the last 72 hours. ? ?Invalid input(s): CO3 ? ? ?No results found for this or any previous visit (from the past 24 hour(s)). ?No results found for this or any previous visit (from the past 240 hour(s)). ? ?Renal Function: ?No results for input(s): CREATININE in the last 168  hours. ?CrCl cannot be calculated (Patient's most recent lab result is older than the maximum 21 days allowed.). ? ? ?Radiologic Imaging: ?No results found. ? ?I independently reviewed the above imaging studies. ? ?Procedures:  Foley catheter placement ? ?The patient's genitals were prepped and draped in usual sterile fashion.  A 16 French Foley catheter was easily inserted into the bladder with return of initially bloody urine that eventually cleared to yellow.  10 mL of sterile water was used to inflate the Foley catheter balloon.  Approximately 600 mL of urine was drained after catheter placement. ? 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 ?-New Foley catheter placed with ease.  Continue monthly catheter exchanges at his facility. ?-Follow-up as needed ? ?Ellison Hughs, MD ?[facility] ?09/14/2021, 2:37 PM  ? ? ? ? ?

## 2021-09-14 NOTE — ED Notes (Signed)
Pt sent back to [facility] w/ PTAR via stretcher. ?

## 2021-09-14 NOTE — Discharge Instructions (Signed)
Continue your home medications as previously prescribed. ?Return to the ER if you start to experience worsening symptoms, severe abdominal pain, fever, chest pain or shortness of breath ?

## 2021-09-14 NOTE — ED Triage Notes (Signed)
Pt bib ems for hematuria.  Pt has chronic foley catheter, catheter was exchanged yesterday by health care facility staff. Staff noticed blood in urine this am and sent pt for eval.  ?

## 2021-09-14 NOTE — ED Provider Notes (Signed)
?[facility] DEPT ?Provider Note ? ? ?CSN: [id number] ?Arrival date & time: 09/14/21  1234 ? ?  ? ?History ? ?Chief Complaint  ?Patient presents with  ? Hematuria  ? ? ?[name] is a 81 y.o. male with a past medical history of diabetes, CKD, Parkinson's disease, Foley catheter in place for several weeks presenting to the ED with a chief complaint of blood in Foley catheter.  Had Foley exchanged last night at nursing facility.  They noticed this morning that he had blood in his Foley catheter.  Patient's only complaint is concerned that he may have soiled himself.  Also reports pain to his penis. ? ? ?Hematuria ?Pertinent negatives include no chest pain, no abdominal pain and no shortness of breath.  ? ?  ? ?Home Medications ?Prior to Admission medications   ?Medication Sig Start Date End Date Taking? Authorizing Provider  ?acetaminophen (TYLENOL) 500 MG tablet Take 1,000 mg by mouth every 6 (six) hours as needed for headache or mild pain.    [provider]  ?albuterol (PROVENTIL) (2.5 MG/3ML) 0.083% nebulizer solution Take 3 mLs (2.5 mg total) by nebulization every 6 (six) hours. 08/01/21   Regalado, Belkys A, MD  ?ALPRAZolam Duanne Moron) 0.5 MG tablet Take 1 tablet (0.5 mg total) by mouth 2 (two) times daily as needed for anxiety. 07/03/21   Thurnell Lose, MD  ?amLODipine (NORVASC) 5 MG tablet Take 1 tablet (5 mg total) by mouth daily. 07/04/21   Thurnell Lose, MD  ?apixaban (ELIQUIS) 5 MG TABS tablet Take 1 tablet (5 mg total) by mouth 2 (two) times daily. 08/03/21   Regalado, Belkys A, MD  ?clonazePAM (KLONOPIN) 0.5 MG disintegrating tablet Take 1 tablet (0.5 mg total) by mouth 2 (two) times daily. 07/03/21   Thurnell Lose, MD  ?diphenhydrAMINE-zinc acetate (BENADRYL) cream Apply topically 2 (two) times daily as needed for itching. ?Patient taking differently: Apply 1 application topically 2 (two) times daily as needed for itching. 07/03/21   Thurnell Lose, MD   ?DULoxetine (CYMBALTA) 30 MG capsule Take 1 capsule (30 mg total) by mouth daily. 07/31/21   Regalado, Belkys A, MD  ?guaiFENesin (MUCINEX) 600 MG 12 hr tablet Take 1 tablet (600 mg total) by mouth 2 (two) times daily. 07/31/21   Regalado, Belkys A, MD  ?hydrOXYzine (ATARAX) 25 MG tablet Take 25 mg by mouth every 8 (eight) hours as needed for itching.    [provider]  ?levothyroxine (SYNTHROID) 137 MCG tablet Take 137 mcg by mouth daily before breakfast. 05/07/21   [provider]  ?lidocaine (LIDODERM) 5 % Place 1 patch onto the skin daily. Apply to right shoulder and bilateral knees. Remove & Discard patch within 12 hours.    [provider]  ?melatonin 5 MG TABS Take 5-10 mg by mouth at bedtime.    [provider]  ?Multiple Vitamins-Minerals (DECUBI-VITE) CAPS Take 1 capsule by mouth daily.    [provider]  ?pantoprazole (PROTONIX) 40 MG tablet Take 1 tablet (40 mg total) by mouth 2 (two) times daily. 07/31/21 09/29/21  Regalado, Jerald Kief A, MD  ?Pollen Extracts (PROSTAT PO) Take 30 mLs by mouth in the morning and at bedtime.    [provider]  ?polyethylene glycol (MIRALAX / GLYCOLAX) 17 g packet Take 17 g by mouth 2 (two) times daily. 07/31/21   Regalado, Belkys A, MD  ?RESTASIS 0.05 % ophthalmic emulsion Place 1 drop into both eyes 2 (two) times daily as needed (  dryness). 06/09/19   [provider]  ?rosuvastatin (CRESTOR) 10 MG tablet Take 1 tablet (10 mg total) by mouth daily. 03/19/21   Supple, Harlon Flor, RPH-CPP  ?senna (SENOKOT) 8.6 MG TABS tablet Take 1 tablet (8.6 mg total) by mouth daily. 08/01/21   Regalado, Belkys A, MD  ?sucralfate (CARAFATE) 1 GM/10ML suspension Take 10 mLs (1 g total) by mouth 2 (two) times daily. 07/31/21   Regalado, Belkys A, MD  ?tiaGABine (GABITRIL) 4 MG tablet Take 2 tablets (8 mg total) by mouth 2 (two) times daily. 07/03/21   Thurnell Lose, MD  ?torsemide (DEMADEX) 20 MG tablet Take 1 tablet (20 mg total) by mouth  as needed (swelling). ?Patient taking differently: Take 20 mg by mouth daily as needed (swelling). 05/04/21   Troy Sine, MD  ?traMADol (ULTRAM) 50 MG tablet Take 50 mg by mouth every 6 (six) hours as needed (pain).    [provider]  ?   ? ?Allergies    ?Hydrochlorothiazide, Lexapro [escitalopram], Lisinopril, Silodosin, and Tamsulosin   ? ?Review of Systems   ?Review of Systems  ?Constitutional:  Negative for appetite change, chills and fever.  ?HENT:  Negative for ear pain, rhinorrhea, sneezing and sore throat.   ?Eyes:  Negative for photophobia and visual disturbance.  ?Respiratory:  Negative for cough, chest tightness, shortness of breath and wheezing.   ?Cardiovascular:  Negative for chest pain and palpitations.  ?Gastrointestinal:  Negative for abdominal pain, blood in stool, constipation, diarrhea, nausea and vomiting.  ?Genitourinary:  Positive for hematuria. Negative for dysuria and urgency.  ?Musculoskeletal:  Negative for myalgias.  ?Skin:  Negative for rash.  ?Neurological:  Negative for dizziness, weakness and light-headedness.  ? ?Physical Exam ?Updated Vital Signs ?BP 125/66   Pulse 76   Temp 98.4 ?F (36.9 ?C) (Oral)   Resp 19   Ht 6' (1.829 m)   Wt 118.8 kg   SpO2 97%   BMI 35.53 kg/m?  ?Physical Exam ?Vitals and nursing note reviewed. Exam conducted with a chaperone present.  ?Constitutional:   ?   General: He is not in acute distress. ?   Appearance: He is well-developed.  ?HENT:  ?   Head: Normocephalic and atraumatic.  ?   Nose: Nose normal.  ?Eyes:  ?   General: No scleral icterus.    ?   Left eye: No discharge.  ?   Conjunctiva/sclera: Conjunctivae normal.  ?Cardiovascular:  ?   Rate and Rhythm: Normal rate and regular rhythm.  ?   Heart sounds: Normal heart sounds. No murmur heard. ?  No friction rub. No gallop.  ?Pulmonary:  ?   Effort: Pulmonary effort is normal. No respiratory distress.  ?   Breath sounds: Normal breath sounds.  ?Abdominal:  ?   General: Bowel sounds  are normal. There is no distension.  ?   Palpations: Abdomen is soft.  ?   Tenderness: There is no abdominal tenderness. There is no guarding.  ?Genitourinary: ?   Comments: Foley in place with blood surrounding the opening of the urethra. ?Musculoskeletal:     ?   General: Normal range of motion.  ?   Cervical back: Normal range of motion and neck supple.  ?Skin: ?   General: Skin is warm and dry.  ?   Findings: No rash.  ?Neurological:  ?   Mental Status: He is alert.  ?   Motor: No abnormal muscle tone.  ?   Coordination: Coordination normal.  ? ? ?  ED Results / Procedures / Treatments   ?Labs ?(all labs ordered are listed, but only abnormal results are displayed) ?Labs Reviewed - No data to display ? ?EKG ?None ? ?Radiology ?No results found. ? ?Procedures ?Procedures  ? ? ?Medications Ordered in ED ?Medications - No data to display ? ?ED Course/ Medical Decision Making/ A&P ?Clinical Course as of 09/14/21 1423  ?Fri Sep 14, 2021  ?1310 Bedside ultrasound shows distended bladder.  Foley removed suspect based on physical exam that it was not inflated and proper positioning.  Will replace and attempt to flush here. [HK]  ?1422 Unable to replace Foley at the bedside urology consulted who replaced successfully [HK]  ?  ?Clinical Course User Index ?[HK] Shelly Coss, Joani Cosma, PA-C  ? ?                        ?Medical Decision Making ? ?81 year old male presenting to the ED for blood in Foley catheter tubing.  This Foley has been in place for several weeks but was replaced last night at nursing facility.  There is blood around the urethral opening.  It does not appear that the Foley was properly placed or inflated in the correct positioning.  This was removed.  He was noted to have a distended bladder on bedside ultrasound.  Foley was replaced by urology at the bedside and flushed successfully.  States that his symptoms have improved.  I suspect that this issue was from improper positioning.  Patient without further complaints.  Return precautions given. ? ? ?Patient is hemodynamically stable, in NAD Evaluation does not show pathology that would require ongoing emergent intervention or inpatient treatment. I explained the diagnosis to the pati

## 2021-09-15 DIAGNOSIS — N390 Urinary tract infection, site not specified: Secondary | ICD-10-CM | POA: Diagnosis not present

## 2021-09-17 DIAGNOSIS — I10 Essential (primary) hypertension: Secondary | ICD-10-CM | POA: Diagnosis not present

## 2021-09-17 DIAGNOSIS — I4891 Unspecified atrial fibrillation: Secondary | ICD-10-CM | POA: Diagnosis not present

## 2021-09-17 DIAGNOSIS — I5020 Unspecified systolic (congestive) heart failure: Secondary | ICD-10-CM | POA: Diagnosis not present

## 2021-09-17 DIAGNOSIS — N390 Urinary tract infection, site not specified: Secondary | ICD-10-CM | POA: Diagnosis not present

## 2021-09-19 DIAGNOSIS — I480 Paroxysmal atrial fibrillation: Secondary | ICD-10-CM | POA: Diagnosis not present

## 2021-09-19 DIAGNOSIS — F419 Anxiety disorder, unspecified: Secondary | ICD-10-CM | POA: Diagnosis not present

## 2021-09-19 DIAGNOSIS — G9341 Metabolic encephalopathy: Secondary | ICD-10-CM | POA: Diagnosis not present

## 2021-09-19 DIAGNOSIS — G4733 Obstructive sleep apnea (adult) (pediatric): Secondary | ICD-10-CM | POA: Diagnosis not present

## 2021-09-19 DIAGNOSIS — N179 Acute kidney failure, unspecified: Secondary | ICD-10-CM | POA: Diagnosis not present

## 2021-09-19 DIAGNOSIS — E039 Hypothyroidism, unspecified: Secondary | ICD-10-CM | POA: Diagnosis not present

## 2021-09-19 DIAGNOSIS — I10 Essential (primary) hypertension: Secondary | ICD-10-CM | POA: Diagnosis not present

## 2021-09-19 DIAGNOSIS — K220 Achalasia of cardia: Secondary | ICD-10-CM | POA: Diagnosis not present

## 2021-09-19 DIAGNOSIS — I5022 Chronic systolic (congestive) heart failure: Secondary | ICD-10-CM | POA: Diagnosis not present

## 2021-09-20 DIAGNOSIS — E039 Hypothyroidism, unspecified: Secondary | ICD-10-CM | POA: Diagnosis not present

## 2021-09-20 DIAGNOSIS — G9341 Metabolic encephalopathy: Secondary | ICD-10-CM | POA: Diagnosis not present

## 2021-09-20 DIAGNOSIS — I10 Essential (primary) hypertension: Secondary | ICD-10-CM | POA: Diagnosis not present

## 2021-09-20 DIAGNOSIS — I480 Paroxysmal atrial fibrillation: Secondary | ICD-10-CM | POA: Diagnosis not present

## 2021-09-20 DIAGNOSIS — K220 Achalasia of cardia: Secondary | ICD-10-CM | POA: Diagnosis not present

## 2021-09-20 DIAGNOSIS — G4733 Obstructive sleep apnea (adult) (pediatric): Secondary | ICD-10-CM | POA: Diagnosis not present

## 2021-09-20 DIAGNOSIS — F419 Anxiety disorder, unspecified: Secondary | ICD-10-CM | POA: Diagnosis not present

## 2021-09-20 DIAGNOSIS — N179 Acute kidney failure, unspecified: Secondary | ICD-10-CM | POA: Diagnosis not present

## 2021-09-20 DIAGNOSIS — I5022 Chronic systolic (congestive) heart failure: Secondary | ICD-10-CM | POA: Diagnosis not present

## 2021-09-21 DIAGNOSIS — I10 Essential (primary) hypertension: Secondary | ICD-10-CM | POA: Diagnosis not present

## 2021-09-21 DIAGNOSIS — G9341 Metabolic encephalopathy: Secondary | ICD-10-CM | POA: Diagnosis not present

## 2021-09-21 DIAGNOSIS — I5022 Chronic systolic (congestive) heart failure: Secondary | ICD-10-CM | POA: Diagnosis not present

## 2021-09-21 DIAGNOSIS — N179 Acute kidney failure, unspecified: Secondary | ICD-10-CM | POA: Diagnosis not present

## 2021-09-21 DIAGNOSIS — I480 Paroxysmal atrial fibrillation: Secondary | ICD-10-CM | POA: Diagnosis not present

## 2021-09-21 DIAGNOSIS — G4733 Obstructive sleep apnea (adult) (pediatric): Secondary | ICD-10-CM | POA: Diagnosis not present

## 2021-09-21 DIAGNOSIS — E039 Hypothyroidism, unspecified: Secondary | ICD-10-CM | POA: Diagnosis not present

## 2021-09-21 DIAGNOSIS — F419 Anxiety disorder, unspecified: Secondary | ICD-10-CM | POA: Diagnosis not present

## 2021-09-21 DIAGNOSIS — K220 Achalasia of cardia: Secondary | ICD-10-CM | POA: Diagnosis not present

## 2021-09-23 DIAGNOSIS — N179 Acute kidney failure, unspecified: Secondary | ICD-10-CM | POA: Diagnosis not present

## 2021-09-23 DIAGNOSIS — K220 Achalasia of cardia: Secondary | ICD-10-CM | POA: Diagnosis not present

## 2021-09-23 DIAGNOSIS — I5022 Chronic systolic (congestive) heart failure: Secondary | ICD-10-CM | POA: Diagnosis not present

## 2021-09-23 DIAGNOSIS — I10 Essential (primary) hypertension: Secondary | ICD-10-CM | POA: Diagnosis not present

## 2021-09-23 DIAGNOSIS — F419 Anxiety disorder, unspecified: Secondary | ICD-10-CM | POA: Diagnosis not present

## 2021-09-23 DIAGNOSIS — E039 Hypothyroidism, unspecified: Secondary | ICD-10-CM | POA: Diagnosis not present

## 2021-09-23 DIAGNOSIS — G4733 Obstructive sleep apnea (adult) (pediatric): Secondary | ICD-10-CM | POA: Diagnosis not present

## 2021-09-23 DIAGNOSIS — G9341 Metabolic encephalopathy: Secondary | ICD-10-CM | POA: Diagnosis not present

## 2021-09-23 DIAGNOSIS — I480 Paroxysmal atrial fibrillation: Secondary | ICD-10-CM | POA: Diagnosis not present

## 2021-09-24 DIAGNOSIS — I10 Essential (primary) hypertension: Secondary | ICD-10-CM | POA: Diagnosis not present

## 2021-09-24 DIAGNOSIS — G9341 Metabolic encephalopathy: Secondary | ICD-10-CM | POA: Diagnosis not present

## 2021-09-24 DIAGNOSIS — E039 Hypothyroidism, unspecified: Secondary | ICD-10-CM | POA: Diagnosis not present

## 2021-09-24 DIAGNOSIS — I480 Paroxysmal atrial fibrillation: Secondary | ICD-10-CM | POA: Diagnosis not present

## 2021-09-24 DIAGNOSIS — I5022 Chronic systolic (congestive) heart failure: Secondary | ICD-10-CM | POA: Diagnosis not present

## 2021-09-24 DIAGNOSIS — N179 Acute kidney failure, unspecified: Secondary | ICD-10-CM | POA: Diagnosis not present

## 2021-09-24 DIAGNOSIS — K220 Achalasia of cardia: Secondary | ICD-10-CM | POA: Diagnosis not present

## 2021-09-24 DIAGNOSIS — G4733 Obstructive sleep apnea (adult) (pediatric): Secondary | ICD-10-CM | POA: Diagnosis not present

## 2021-09-24 DIAGNOSIS — F419 Anxiety disorder, unspecified: Secondary | ICD-10-CM | POA: Diagnosis not present

## 2021-09-25 DIAGNOSIS — N179 Acute kidney failure, unspecified: Secondary | ICD-10-CM | POA: Diagnosis not present

## 2021-09-25 DIAGNOSIS — I5022 Chronic systolic (congestive) heart failure: Secondary | ICD-10-CM | POA: Diagnosis not present

## 2021-09-25 DIAGNOSIS — I4891 Unspecified atrial fibrillation: Secondary | ICD-10-CM | POA: Diagnosis not present

## 2021-09-25 DIAGNOSIS — G9341 Metabolic encephalopathy: Secondary | ICD-10-CM | POA: Diagnosis not present

## 2021-09-25 DIAGNOSIS — K220 Achalasia of cardia: Secondary | ICD-10-CM | POA: Diagnosis not present

## 2021-09-25 DIAGNOSIS — I480 Paroxysmal atrial fibrillation: Secondary | ICD-10-CM | POA: Diagnosis not present

## 2021-09-25 DIAGNOSIS — M25511 Pain in right shoulder: Secondary | ICD-10-CM | POA: Diagnosis not present

## 2021-09-25 DIAGNOSIS — E039 Hypothyroidism, unspecified: Secondary | ICD-10-CM | POA: Diagnosis not present

## 2021-09-25 DIAGNOSIS — G4733 Obstructive sleep apnea (adult) (pediatric): Secondary | ICD-10-CM | POA: Diagnosis not present

## 2021-09-25 DIAGNOSIS — I10 Essential (primary) hypertension: Secondary | ICD-10-CM | POA: Diagnosis not present

## 2021-09-25 DIAGNOSIS — I5020 Unspecified systolic (congestive) heart failure: Secondary | ICD-10-CM | POA: Diagnosis not present

## 2021-09-25 DIAGNOSIS — F419 Anxiety disorder, unspecified: Secondary | ICD-10-CM | POA: Diagnosis not present

## 2021-09-26 DIAGNOSIS — G9341 Metabolic encephalopathy: Secondary | ICD-10-CM | POA: Diagnosis not present

## 2021-09-26 DIAGNOSIS — I10 Essential (primary) hypertension: Secondary | ICD-10-CM | POA: Diagnosis not present

## 2021-09-26 DIAGNOSIS — I5022 Chronic systolic (congestive) heart failure: Secondary | ICD-10-CM | POA: Diagnosis not present

## 2021-09-26 DIAGNOSIS — N179 Acute kidney failure, unspecified: Secondary | ICD-10-CM | POA: Diagnosis not present

## 2021-09-26 DIAGNOSIS — F419 Anxiety disorder, unspecified: Secondary | ICD-10-CM | POA: Diagnosis not present

## 2021-09-26 DIAGNOSIS — I480 Paroxysmal atrial fibrillation: Secondary | ICD-10-CM | POA: Diagnosis not present

## 2021-09-26 DIAGNOSIS — G4733 Obstructive sleep apnea (adult) (pediatric): Secondary | ICD-10-CM | POA: Diagnosis not present

## 2021-09-26 DIAGNOSIS — E039 Hypothyroidism, unspecified: Secondary | ICD-10-CM | POA: Diagnosis not present

## 2021-09-26 DIAGNOSIS — K220 Achalasia of cardia: Secondary | ICD-10-CM | POA: Diagnosis not present

## 2021-09-27 DIAGNOSIS — N179 Acute kidney failure, unspecified: Secondary | ICD-10-CM | POA: Diagnosis not present

## 2021-09-27 DIAGNOSIS — I5022 Chronic systolic (congestive) heart failure: Secondary | ICD-10-CM | POA: Diagnosis not present

## 2021-09-27 DIAGNOSIS — G4733 Obstructive sleep apnea (adult) (pediatric): Secondary | ICD-10-CM | POA: Diagnosis not present

## 2021-09-27 DIAGNOSIS — I10 Essential (primary) hypertension: Secondary | ICD-10-CM | POA: Diagnosis not present

## 2021-09-27 DIAGNOSIS — N390 Urinary tract infection, site not specified: Secondary | ICD-10-CM | POA: Insufficient documentation

## 2021-09-27 DIAGNOSIS — E039 Hypothyroidism, unspecified: Secondary | ICD-10-CM | POA: Diagnosis not present

## 2021-09-27 DIAGNOSIS — I480 Paroxysmal atrial fibrillation: Secondary | ICD-10-CM | POA: Diagnosis not present

## 2021-09-27 DIAGNOSIS — K220 Achalasia of cardia: Secondary | ICD-10-CM | POA: Diagnosis not present

## 2021-09-27 DIAGNOSIS — G9341 Metabolic encephalopathy: Secondary | ICD-10-CM | POA: Diagnosis not present

## 2021-09-27 DIAGNOSIS — F419 Anxiety disorder, unspecified: Secondary | ICD-10-CM | POA: Diagnosis not present

## 2021-09-28 DIAGNOSIS — I5022 Chronic systolic (congestive) heart failure: Secondary | ICD-10-CM | POA: Diagnosis not present

## 2021-09-28 DIAGNOSIS — I480 Paroxysmal atrial fibrillation: Secondary | ICD-10-CM | POA: Diagnosis not present

## 2021-09-28 DIAGNOSIS — F39 Unspecified mood [affective] disorder: Secondary | ICD-10-CM | POA: Diagnosis not present

## 2021-09-28 DIAGNOSIS — N179 Acute kidney failure, unspecified: Secondary | ICD-10-CM | POA: Diagnosis not present

## 2021-09-28 DIAGNOSIS — G4733 Obstructive sleep apnea (adult) (pediatric): Secondary | ICD-10-CM | POA: Diagnosis not present

## 2021-09-28 DIAGNOSIS — I10 Essential (primary) hypertension: Secondary | ICD-10-CM | POA: Diagnosis not present

## 2021-09-28 DIAGNOSIS — E039 Hypothyroidism, unspecified: Secondary | ICD-10-CM | POA: Diagnosis not present

## 2021-09-28 DIAGNOSIS — K220 Achalasia of cardia: Secondary | ICD-10-CM | POA: Diagnosis not present

## 2021-09-28 DIAGNOSIS — G9341 Metabolic encephalopathy: Secondary | ICD-10-CM | POA: Diagnosis not present

## 2021-09-28 DIAGNOSIS — F419 Anxiety disorder, unspecified: Secondary | ICD-10-CM | POA: Diagnosis not present

## 2021-09-28 NOTE — Progress Notes (Signed)
? ?Virtual Visit Via Video  ? ?The purpose of this virtual visit is to provide medical care while limiting exposure to the novel coronavirus.   ? ?Consent was obtained for video visit:  Yes.   ?Answered questions that patient had about telehealth interaction:  Yes.   ?I discussed the limitations, risks, security and privacy concerns of performing an evaluation and management service by telemedicine. I also discussed with the patient that there may be a patient responsible charge related to this service. The patient expressed understanding and agreed to proceed. ? ?Pt location: Home ?Physician Location: office ?Name of referring provider:  [name], MD ?I connected with [name] at patients initiation/request on 10/01/2021 at  2:30 PM EDT by video enabled telemedicine application and verified that I am speaking with the correct person using two identifiers. ?Pt MRN:  [id number] ?Pt DOB:  [birth year] ?Video Participants:  [name];   ? ? ?Assessment/Plan:  ? ?1.  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 -Not sure that levodopa is what caused him to go into the hospital the first time (had mental status change, but also had pneumonia, had stopped Ativan), but I am somewhat leery about retrying it again.  Patient very much wants to go back on it because he cannot walk well.  Discussed DaTscan.  Discussed skin biopsy for alpha-synuclein.  Discussed FD-PET.  Ultimately, we decided to go ahead and do the FDG-PET scan.  I think that FTD is high on list of ddx.   ? -Neurology in the hospital suggested he had CBGD.  I did not see any evidence of this previously.  He is currently on video, so was not able to adequately evaluate that. ? 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 ? ?2.  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  Patient seeing psychiatry.  On Gabitril. ? -Patient is on 2 different benzodiazepines, both Xanax and clonazepam, both twice per day.  He is  following with psychiatry.  When his Ativan was stopped previously, the patient ended up in the hospital with confusion and stiffness, but the patient also had pneumonia.  When his Ativan was given back, he did better, so that is the reason that psychiatry placed him back on benzodiazepine.  The Ativan made him too sleepy.  It is unclear to me why he is on 2 different benzodiazepines, both on a twice daily basis, however. ? ?3.  Achalasia ? -Patient has had Botox of the lower esophageal sphincter previously.  This is the source of swallow difficulties. ? ?4.  History of GI bleed ? -Follows with gastroenterology. ?Subjective:  ? ?[name] was seen today in follow up for Parkinsons disease.  My previous records were reviewed prior to todays visit as well as outside records available to me.  Much has happened since our last visit.  Numerous records were reviewed.  Last visit, I felt that the patient could have Parkinson's disease, but behavioral variant of FTD could be in the differential.  We discussed whether or not to even try levodopa, but ultimately they decided to go ahead and trial it.  Patient declined any neuroimaging.  This was on January 12.  January 25 he was in the emergency room with trouble walking and mental status change.  He had stopped levodopa 3 days prior to admission.  Patient got Haldol in the emergency room and symptoms improved.  In addition, Ativan (which she had been on for a  long time, but had been weaned before admission) in the hospital improved symptoms.  LP was unable to be obtained due to inability to position him and inability to lay flat on his stomach.  Patient was unable to have MRI brain.  He did have negative CT brain.  He was also treated for pneumonia (contributing/cause of mental status change).  Neurology question diagnosis of possible CBGD.  Patient was discharged on February 1.  Patient returned to the emergency room February 26 for hematemesis and abdominal pain.  He was  found to have GI bleed.  Eliquis held, but was able to be resumed after a few days.  Patient back in the emergency room March 4 after a fall requiring stitches above the left eyebrow. Daughter states that he was confused in middle of the night and thgouht he could walk.   Patient found to have UTI and treated for that.  Patient back in the emergency room April 14 after blood in the Foley.  Daughter states that patient is no longer on the Ativan and was only on it for a week.  He is now on a combination of Xanax, 0.5 mg twice per day as well as clonazepam 0.5 mg twice per day.  Patient really wants to go back on the levodopa, because he has trouble lifting up his feet in therapy and wants to go back home.  He has been in rehab for 3 months at [facility].  Patient states that he still has intermittent tremor on the right hand. ?Current prescribed movement disorder medications: ?None ? ? ? ?ALLERGIES:   ?Allergies  ?Allergen Reactions  ? Hydrochlorothiazide Other (See Comments)  ?  "gout flares"  ? Lexapro [Escitalopram] Other (See Comments)  ?  Ineffective for anxiety (10/2011)  ? Lisinopril Swelling and Other (See Comments)  ?  Angioedema ?  ? Silodosin Diarrhea  ? Tamsulosin Other (See Comments)  ?  Visual changes  ? ? ?CURRENT MEDICATIONS:  ?Current Meds  ?Medication Sig  ? acetaminophen (TYLENOL) 500 MG tablet Take 1,000 mg by mouth every 6 (six) hours as needed for headache or mild pain.  ? ALPRAZolam (XANAX) 0.5 MG tablet Take 1 tablet (0.5 mg total) by mouth 2 (two) times daily as needed for anxiety.  ? amLODipine (NORVASC) 5 MG tablet Take 1 tablet (5 mg total) by mouth daily.  ? clonazePAM (KLONOPIN) 0.5 MG disintegrating tablet Take 1 tablet (0.5 mg total) by mouth 2 (two) times daily.  ? diphenhydrAMINE-zinc acetate (BENADRYL) cream Apply topically 2 (two) times daily as needed for itching. (Patient taking differently: Apply 1 application. topically 2 (two) times daily as needed for itching.)  ?  guaiFENesin (MUCINEX) 600 MG 12 hr tablet Take 1 tablet (600 mg total) by mouth 2 (two) times daily.  ? hydrOXYzine (ATARAX) 25 MG tablet Take 25 mg by mouth every 8 (eight) hours as needed for itching.  ? levothyroxine (SYNTHROID) 137 MCG tablet Take 137 mcg by mouth daily before breakfast.  ? lidocaine (LIDODERM) 5 % Place 1 patch onto the skin daily. Apply to right shoulder and bilateral knees. Remove & Discard patch within 12 hours.  ? melatonin 5 MG TABS Take 5-10 mg by mouth at bedtime.  ? Multiple Vitamins-Minerals (DECUBI-VITE) CAPS Take 1 capsule by mouth daily.  ? Pollen Extracts (PROSTAT PO) Take 30 mLs by mouth in the morning and at bedtime.  ? polyethylene glycol (MIRALAX / GLYCOLAX) 17 g packet Take 17 g by mouth 2 (two) times daily.  ?  RESTASIS 0.05 % ophthalmic emulsion Place 1 drop into both eyes 2 (two) times daily as needed (dryness).  ? rosuvastatin (CRESTOR) 10 MG tablet Take 1 tablet (10 mg total) by mouth daily.  ? senna (SENOKOT) 8.6 MG TABS tablet Take 1 tablet (8.6 mg total) by mouth daily.  ? sucralfate (CARAFATE) 1 GM/10ML suspension Take 10 mLs (1 g total) by mouth 2 (two) times daily.  ? tiaGABine (GABITRIL) 4 MG tablet Take 2 tablets (8 mg total) by mouth 2 (two) times daily.  ? torsemide (DEMADEX) 20 MG tablet Take 1 tablet (20 mg total) by mouth as needed (swelling). (Patient taking differently: Take 20 mg by mouth daily as needed (swelling).)  ? traMADol (ULTRAM) 50 MG tablet Take 50 mg by mouth every 6 (six) hours as needed (pain).  ? ? ? ?Objective:  ? ?PHYSICAL EXAMINATION:   ? ?VITALS:  There were no vitals filed for this visit. ? ?GEN:  The patient appears stated age and is in NAD. ?HEENT:  Normocephalic, atraumatic.   ? ?Neurological examination: ? ?Orientation: The patient is alert and oriented to person and place.  Looks to daughter for finer aspects of the history. ?Cranial nerves: There is good facial symmetry.  There is facial hypomimia with lips parted.  Extraocular  muscles are intact.  The speech is fluent and dysarthric over the video (do not remember that last visit). Soft palate rises symmetrically and there is no tongue deviation. Hearing is intact to conversational tone

## 2021-09-29 DIAGNOSIS — I5022 Chronic systolic (congestive) heart failure: Secondary | ICD-10-CM | POA: Diagnosis not present

## 2021-09-29 DIAGNOSIS — N179 Acute kidney failure, unspecified: Secondary | ICD-10-CM | POA: Diagnosis not present

## 2021-09-29 DIAGNOSIS — K220 Achalasia of cardia: Secondary | ICD-10-CM | POA: Diagnosis not present

## 2021-09-29 DIAGNOSIS — I10 Essential (primary) hypertension: Secondary | ICD-10-CM | POA: Diagnosis not present

## 2021-09-29 DIAGNOSIS — I480 Paroxysmal atrial fibrillation: Secondary | ICD-10-CM | POA: Diagnosis not present

## 2021-09-29 DIAGNOSIS — E039 Hypothyroidism, unspecified: Secondary | ICD-10-CM | POA: Diagnosis not present

## 2021-09-29 DIAGNOSIS — F419 Anxiety disorder, unspecified: Secondary | ICD-10-CM | POA: Diagnosis not present

## 2021-09-29 DIAGNOSIS — G9341 Metabolic encephalopathy: Secondary | ICD-10-CM | POA: Diagnosis not present

## 2021-09-29 DIAGNOSIS — G4733 Obstructive sleep apnea (adult) (pediatric): Secondary | ICD-10-CM | POA: Diagnosis not present

## 2021-09-30 DIAGNOSIS — I5022 Chronic systolic (congestive) heart failure: Secondary | ICD-10-CM | POA: Diagnosis not present

## 2021-09-30 DIAGNOSIS — G4733 Obstructive sleep apnea (adult) (pediatric): Secondary | ICD-10-CM | POA: Diagnosis not present

## 2021-09-30 DIAGNOSIS — I10 Essential (primary) hypertension: Secondary | ICD-10-CM | POA: Diagnosis not present

## 2021-09-30 DIAGNOSIS — I480 Paroxysmal atrial fibrillation: Secondary | ICD-10-CM | POA: Diagnosis not present

## 2021-09-30 DIAGNOSIS — N179 Acute kidney failure, unspecified: Secondary | ICD-10-CM | POA: Diagnosis not present

## 2021-09-30 DIAGNOSIS — E039 Hypothyroidism, unspecified: Secondary | ICD-10-CM | POA: Diagnosis not present

## 2021-09-30 DIAGNOSIS — F419 Anxiety disorder, unspecified: Secondary | ICD-10-CM | POA: Diagnosis not present

## 2021-09-30 DIAGNOSIS — G9341 Metabolic encephalopathy: Secondary | ICD-10-CM | POA: Diagnosis not present

## 2021-09-30 DIAGNOSIS — K220 Achalasia of cardia: Secondary | ICD-10-CM | POA: Diagnosis not present

## 2021-10-01 ENCOUNTER — Encounter: Payer: Self-pay | Admitting: Neurology

## 2021-10-01 ENCOUNTER — Telehealth (INDEPENDENT_AMBULATORY_CARE_PROVIDER_SITE_OTHER): Payer: Medicare HMO | Admitting: Neurology

## 2021-10-01 DIAGNOSIS — F419 Anxiety disorder, unspecified: Secondary | ICD-10-CM | POA: Diagnosis not present

## 2021-10-01 DIAGNOSIS — G9341 Metabolic encephalopathy: Secondary | ICD-10-CM | POA: Diagnosis not present

## 2021-10-01 DIAGNOSIS — R251 Tremor, unspecified: Secondary | ICD-10-CM

## 2021-10-01 DIAGNOSIS — R413 Other amnesia: Secondary | ICD-10-CM

## 2021-10-01 DIAGNOSIS — G4733 Obstructive sleep apnea (adult) (pediatric): Secondary | ICD-10-CM | POA: Diagnosis not present

## 2021-10-01 DIAGNOSIS — N179 Acute kidney failure, unspecified: Secondary | ICD-10-CM | POA: Diagnosis not present

## 2021-10-01 DIAGNOSIS — F0280 Dementia in other diseases classified elsewhere without behavioral disturbance: Secondary | ICD-10-CM

## 2021-10-01 DIAGNOSIS — M6281 Muscle weakness (generalized): Secondary | ICD-10-CM | POA: Diagnosis not present

## 2021-10-01 DIAGNOSIS — I10 Essential (primary) hypertension: Secondary | ICD-10-CM | POA: Diagnosis not present

## 2021-10-01 DIAGNOSIS — I4891 Unspecified atrial fibrillation: Secondary | ICD-10-CM | POA: Diagnosis not present

## 2021-10-01 DIAGNOSIS — M1990 Unspecified osteoarthritis, unspecified site: Secondary | ICD-10-CM | POA: Diagnosis not present

## 2021-10-01 DIAGNOSIS — I480 Paroxysmal atrial fibrillation: Secondary | ICD-10-CM | POA: Diagnosis not present

## 2021-10-01 DIAGNOSIS — G20 Parkinson's disease: Secondary | ICD-10-CM | POA: Diagnosis not present

## 2021-10-01 DIAGNOSIS — E039 Hypothyroidism, unspecified: Secondary | ICD-10-CM | POA: Diagnosis not present

## 2021-10-01 DIAGNOSIS — I5020 Unspecified systolic (congestive) heart failure: Secondary | ICD-10-CM | POA: Diagnosis not present

## 2021-10-01 DIAGNOSIS — I5022 Chronic systolic (congestive) heart failure: Secondary | ICD-10-CM | POA: Diagnosis not present

## 2021-10-01 DIAGNOSIS — G3109 Other frontotemporal dementia: Secondary | ICD-10-CM | POA: Diagnosis not present

## 2021-10-01 NOTE — Addendum Note (Signed)
Addended by: Jonnie Finner C on: 10/01/2021 03:42 PM ? ? Modules accepted: Orders ? ?

## 2021-10-02 DIAGNOSIS — E039 Hypothyroidism, unspecified: Secondary | ICD-10-CM | POA: Diagnosis not present

## 2021-10-02 DIAGNOSIS — I5022 Chronic systolic (congestive) heart failure: Secondary | ICD-10-CM | POA: Diagnosis not present

## 2021-10-02 DIAGNOSIS — F419 Anxiety disorder, unspecified: Secondary | ICD-10-CM | POA: Diagnosis not present

## 2021-10-02 DIAGNOSIS — G4733 Obstructive sleep apnea (adult) (pediatric): Secondary | ICD-10-CM | POA: Diagnosis not present

## 2021-10-02 DIAGNOSIS — N179 Acute kidney failure, unspecified: Secondary | ICD-10-CM | POA: Diagnosis not present

## 2021-10-02 DIAGNOSIS — I10 Essential (primary) hypertension: Secondary | ICD-10-CM | POA: Diagnosis not present

## 2021-10-02 DIAGNOSIS — G9341 Metabolic encephalopathy: Secondary | ICD-10-CM | POA: Diagnosis not present

## 2021-10-02 DIAGNOSIS — M6281 Muscle weakness (generalized): Secondary | ICD-10-CM | POA: Diagnosis not present

## 2021-10-02 DIAGNOSIS — I480 Paroxysmal atrial fibrillation: Secondary | ICD-10-CM | POA: Diagnosis not present

## 2021-10-03 DIAGNOSIS — F419 Anxiety disorder, unspecified: Secondary | ICD-10-CM | POA: Diagnosis not present

## 2021-10-03 DIAGNOSIS — I5022 Chronic systolic (congestive) heart failure: Secondary | ICD-10-CM | POA: Diagnosis not present

## 2021-10-03 DIAGNOSIS — G4733 Obstructive sleep apnea (adult) (pediatric): Secondary | ICD-10-CM | POA: Diagnosis not present

## 2021-10-03 DIAGNOSIS — I480 Paroxysmal atrial fibrillation: Secondary | ICD-10-CM | POA: Diagnosis not present

## 2021-10-03 DIAGNOSIS — M6281 Muscle weakness (generalized): Secondary | ICD-10-CM | POA: Diagnosis not present

## 2021-10-03 DIAGNOSIS — G9341 Metabolic encephalopathy: Secondary | ICD-10-CM | POA: Diagnosis not present

## 2021-10-03 DIAGNOSIS — I10 Essential (primary) hypertension: Secondary | ICD-10-CM | POA: Diagnosis not present

## 2021-10-03 DIAGNOSIS — N179 Acute kidney failure, unspecified: Secondary | ICD-10-CM | POA: Diagnosis not present

## 2021-10-03 DIAGNOSIS — E039 Hypothyroidism, unspecified: Secondary | ICD-10-CM | POA: Diagnosis not present

## 2021-10-04 DIAGNOSIS — I5022 Chronic systolic (congestive) heart failure: Secondary | ICD-10-CM | POA: Diagnosis not present

## 2021-10-04 DIAGNOSIS — G4733 Obstructive sleep apnea (adult) (pediatric): Secondary | ICD-10-CM | POA: Diagnosis not present

## 2021-10-04 DIAGNOSIS — E039 Hypothyroidism, unspecified: Secondary | ICD-10-CM | POA: Diagnosis not present

## 2021-10-04 DIAGNOSIS — I480 Paroxysmal atrial fibrillation: Secondary | ICD-10-CM | POA: Diagnosis not present

## 2021-10-04 DIAGNOSIS — G9341 Metabolic encephalopathy: Secondary | ICD-10-CM | POA: Diagnosis not present

## 2021-10-04 DIAGNOSIS — M6281 Muscle weakness (generalized): Secondary | ICD-10-CM | POA: Diagnosis not present

## 2021-10-04 DIAGNOSIS — N179 Acute kidney failure, unspecified: Secondary | ICD-10-CM | POA: Diagnosis not present

## 2021-10-04 DIAGNOSIS — F419 Anxiety disorder, unspecified: Secondary | ICD-10-CM | POA: Diagnosis not present

## 2021-10-04 DIAGNOSIS — I10 Essential (primary) hypertension: Secondary | ICD-10-CM | POA: Diagnosis not present

## 2021-10-05 DIAGNOSIS — M6281 Muscle weakness (generalized): Secondary | ICD-10-CM | POA: Diagnosis not present

## 2021-10-05 DIAGNOSIS — F419 Anxiety disorder, unspecified: Secondary | ICD-10-CM | POA: Diagnosis not present

## 2021-10-05 DIAGNOSIS — I480 Paroxysmal atrial fibrillation: Secondary | ICD-10-CM | POA: Diagnosis not present

## 2021-10-05 DIAGNOSIS — N179 Acute kidney failure, unspecified: Secondary | ICD-10-CM | POA: Diagnosis not present

## 2021-10-05 DIAGNOSIS — E039 Hypothyroidism, unspecified: Secondary | ICD-10-CM | POA: Diagnosis not present

## 2021-10-05 DIAGNOSIS — I10 Essential (primary) hypertension: Secondary | ICD-10-CM | POA: Diagnosis not present

## 2021-10-05 DIAGNOSIS — G4733 Obstructive sleep apnea (adult) (pediatric): Secondary | ICD-10-CM | POA: Diagnosis not present

## 2021-10-05 DIAGNOSIS — I5022 Chronic systolic (congestive) heart failure: Secondary | ICD-10-CM | POA: Diagnosis not present

## 2021-10-05 DIAGNOSIS — G9341 Metabolic encephalopathy: Secondary | ICD-10-CM | POA: Diagnosis not present

## 2021-10-07 DIAGNOSIS — M6281 Muscle weakness (generalized): Secondary | ICD-10-CM | POA: Diagnosis not present

## 2021-10-07 DIAGNOSIS — G9341 Metabolic encephalopathy: Secondary | ICD-10-CM | POA: Diagnosis not present

## 2021-10-07 DIAGNOSIS — F419 Anxiety disorder, unspecified: Secondary | ICD-10-CM | POA: Diagnosis not present

## 2021-10-07 DIAGNOSIS — N179 Acute kidney failure, unspecified: Secondary | ICD-10-CM | POA: Diagnosis not present

## 2021-10-07 DIAGNOSIS — E039 Hypothyroidism, unspecified: Secondary | ICD-10-CM | POA: Diagnosis not present

## 2021-10-07 DIAGNOSIS — G4733 Obstructive sleep apnea (adult) (pediatric): Secondary | ICD-10-CM | POA: Diagnosis not present

## 2021-10-07 DIAGNOSIS — I480 Paroxysmal atrial fibrillation: Secondary | ICD-10-CM | POA: Diagnosis not present

## 2021-10-07 DIAGNOSIS — I5022 Chronic systolic (congestive) heart failure: Secondary | ICD-10-CM | POA: Diagnosis not present

## 2021-10-07 DIAGNOSIS — I10 Essential (primary) hypertension: Secondary | ICD-10-CM | POA: Diagnosis not present

## 2021-10-08 DIAGNOSIS — F419 Anxiety disorder, unspecified: Secondary | ICD-10-CM | POA: Diagnosis not present

## 2021-10-08 DIAGNOSIS — I10 Essential (primary) hypertension: Secondary | ICD-10-CM | POA: Diagnosis not present

## 2021-10-08 DIAGNOSIS — N179 Acute kidney failure, unspecified: Secondary | ICD-10-CM | POA: Diagnosis not present

## 2021-10-08 DIAGNOSIS — E039 Hypothyroidism, unspecified: Secondary | ICD-10-CM | POA: Diagnosis not present

## 2021-10-08 DIAGNOSIS — I480 Paroxysmal atrial fibrillation: Secondary | ICD-10-CM | POA: Diagnosis not present

## 2021-10-08 DIAGNOSIS — G4733 Obstructive sleep apnea (adult) (pediatric): Secondary | ICD-10-CM | POA: Diagnosis not present

## 2021-10-08 DIAGNOSIS — G9341 Metabolic encephalopathy: Secondary | ICD-10-CM | POA: Diagnosis not present

## 2021-10-08 DIAGNOSIS — M6281 Muscle weakness (generalized): Secondary | ICD-10-CM | POA: Diagnosis not present

## 2021-10-08 DIAGNOSIS — I5022 Chronic systolic (congestive) heart failure: Secondary | ICD-10-CM | POA: Diagnosis not present

## 2021-10-09 DIAGNOSIS — M6281 Muscle weakness (generalized): Secondary | ICD-10-CM | POA: Diagnosis not present

## 2021-10-09 DIAGNOSIS — G4733 Obstructive sleep apnea (adult) (pediatric): Secondary | ICD-10-CM | POA: Diagnosis not present

## 2021-10-09 DIAGNOSIS — E039 Hypothyroidism, unspecified: Secondary | ICD-10-CM | POA: Diagnosis not present

## 2021-10-09 DIAGNOSIS — I480 Paroxysmal atrial fibrillation: Secondary | ICD-10-CM | POA: Diagnosis not present

## 2021-10-09 DIAGNOSIS — I5022 Chronic systolic (congestive) heart failure: Secondary | ICD-10-CM | POA: Diagnosis not present

## 2021-10-09 DIAGNOSIS — N179 Acute kidney failure, unspecified: Secondary | ICD-10-CM | POA: Diagnosis not present

## 2021-10-09 DIAGNOSIS — G9341 Metabolic encephalopathy: Secondary | ICD-10-CM | POA: Diagnosis not present

## 2021-10-09 DIAGNOSIS — I10 Essential (primary) hypertension: Secondary | ICD-10-CM | POA: Diagnosis not present

## 2021-10-09 DIAGNOSIS — F419 Anxiety disorder, unspecified: Secondary | ICD-10-CM | POA: Diagnosis not present

## 2021-10-10 DIAGNOSIS — G4733 Obstructive sleep apnea (adult) (pediatric): Secondary | ICD-10-CM | POA: Diagnosis not present

## 2021-10-10 DIAGNOSIS — M6281 Muscle weakness (generalized): Secondary | ICD-10-CM | POA: Diagnosis not present

## 2021-10-10 DIAGNOSIS — G9341 Metabolic encephalopathy: Secondary | ICD-10-CM | POA: Diagnosis not present

## 2021-10-10 DIAGNOSIS — I10 Essential (primary) hypertension: Secondary | ICD-10-CM | POA: Diagnosis not present

## 2021-10-10 DIAGNOSIS — I5022 Chronic systolic (congestive) heart failure: Secondary | ICD-10-CM | POA: Diagnosis not present

## 2021-10-10 DIAGNOSIS — I480 Paroxysmal atrial fibrillation: Secondary | ICD-10-CM | POA: Diagnosis not present

## 2021-10-10 DIAGNOSIS — E039 Hypothyroidism, unspecified: Secondary | ICD-10-CM | POA: Diagnosis not present

## 2021-10-10 DIAGNOSIS — N179 Acute kidney failure, unspecified: Secondary | ICD-10-CM | POA: Diagnosis not present

## 2021-10-10 DIAGNOSIS — F419 Anxiety disorder, unspecified: Secondary | ICD-10-CM | POA: Diagnosis not present

## 2021-10-11 DIAGNOSIS — N179 Acute kidney failure, unspecified: Secondary | ICD-10-CM | POA: Diagnosis not present

## 2021-10-11 DIAGNOSIS — F419 Anxiety disorder, unspecified: Secondary | ICD-10-CM | POA: Diagnosis not present

## 2021-10-11 DIAGNOSIS — M6281 Muscle weakness (generalized): Secondary | ICD-10-CM | POA: Diagnosis not present

## 2021-10-11 DIAGNOSIS — I5022 Chronic systolic (congestive) heart failure: Secondary | ICD-10-CM | POA: Diagnosis not present

## 2021-10-11 DIAGNOSIS — G4733 Obstructive sleep apnea (adult) (pediatric): Secondary | ICD-10-CM | POA: Diagnosis not present

## 2021-10-11 DIAGNOSIS — E039 Hypothyroidism, unspecified: Secondary | ICD-10-CM | POA: Diagnosis not present

## 2021-10-11 DIAGNOSIS — I480 Paroxysmal atrial fibrillation: Secondary | ICD-10-CM | POA: Diagnosis not present

## 2021-10-11 DIAGNOSIS — I10 Essential (primary) hypertension: Secondary | ICD-10-CM | POA: Diagnosis not present

## 2021-10-11 DIAGNOSIS — G9341 Metabolic encephalopathy: Secondary | ICD-10-CM | POA: Diagnosis not present

## 2021-10-12 DIAGNOSIS — I5030 Unspecified diastolic (congestive) heart failure: Secondary | ICD-10-CM | POA: Diagnosis not present

## 2021-10-12 DIAGNOSIS — N183 Chronic kidney disease, stage 3 unspecified: Secondary | ICD-10-CM | POA: Diagnosis not present

## 2021-10-12 DIAGNOSIS — F0390 Unspecified dementia without behavioral disturbance: Secondary | ICD-10-CM | POA: Diagnosis not present

## 2021-10-12 DIAGNOSIS — I4891 Unspecified atrial fibrillation: Secondary | ICD-10-CM | POA: Diagnosis not present

## 2021-10-14 DIAGNOSIS — N179 Acute kidney failure, unspecified: Secondary | ICD-10-CM | POA: Diagnosis not present

## 2021-10-14 DIAGNOSIS — G4733 Obstructive sleep apnea (adult) (pediatric): Secondary | ICD-10-CM | POA: Diagnosis not present

## 2021-10-14 DIAGNOSIS — I5022 Chronic systolic (congestive) heart failure: Secondary | ICD-10-CM | POA: Diagnosis not present

## 2021-10-14 DIAGNOSIS — I480 Paroxysmal atrial fibrillation: Secondary | ICD-10-CM | POA: Diagnosis not present

## 2021-10-14 DIAGNOSIS — G9341 Metabolic encephalopathy: Secondary | ICD-10-CM | POA: Diagnosis not present

## 2021-10-14 DIAGNOSIS — M6281 Muscle weakness (generalized): Secondary | ICD-10-CM | POA: Diagnosis not present

## 2021-10-14 DIAGNOSIS — F419 Anxiety disorder, unspecified: Secondary | ICD-10-CM | POA: Diagnosis not present

## 2021-10-14 DIAGNOSIS — E039 Hypothyroidism, unspecified: Secondary | ICD-10-CM | POA: Diagnosis not present

## 2021-10-14 DIAGNOSIS — I10 Essential (primary) hypertension: Secondary | ICD-10-CM | POA: Diagnosis not present

## 2021-10-15 DIAGNOSIS — G4733 Obstructive sleep apnea (adult) (pediatric): Secondary | ICD-10-CM | POA: Diagnosis not present

## 2021-10-15 DIAGNOSIS — E039 Hypothyroidism, unspecified: Secondary | ICD-10-CM | POA: Diagnosis not present

## 2021-10-15 DIAGNOSIS — I10 Essential (primary) hypertension: Secondary | ICD-10-CM | POA: Diagnosis not present

## 2021-10-15 DIAGNOSIS — I5022 Chronic systolic (congestive) heart failure: Secondary | ICD-10-CM | POA: Diagnosis not present

## 2021-10-15 DIAGNOSIS — G9341 Metabolic encephalopathy: Secondary | ICD-10-CM | POA: Diagnosis not present

## 2021-10-15 DIAGNOSIS — M6281 Muscle weakness (generalized): Secondary | ICD-10-CM | POA: Diagnosis not present

## 2021-10-15 DIAGNOSIS — F419 Anxiety disorder, unspecified: Secondary | ICD-10-CM | POA: Diagnosis not present

## 2021-10-15 DIAGNOSIS — I480 Paroxysmal atrial fibrillation: Secondary | ICD-10-CM | POA: Diagnosis not present

## 2021-10-15 DIAGNOSIS — N179 Acute kidney failure, unspecified: Secondary | ICD-10-CM | POA: Diagnosis not present

## 2021-10-16 DIAGNOSIS — I480 Paroxysmal atrial fibrillation: Secondary | ICD-10-CM | POA: Diagnosis not present

## 2021-10-16 DIAGNOSIS — I5022 Chronic systolic (congestive) heart failure: Secondary | ICD-10-CM | POA: Diagnosis not present

## 2021-10-16 DIAGNOSIS — N179 Acute kidney failure, unspecified: Secondary | ICD-10-CM | POA: Diagnosis not present

## 2021-10-16 DIAGNOSIS — G9341 Metabolic encephalopathy: Secondary | ICD-10-CM | POA: Diagnosis not present

## 2021-10-16 DIAGNOSIS — G4733 Obstructive sleep apnea (adult) (pediatric): Secondary | ICD-10-CM | POA: Diagnosis not present

## 2021-10-16 DIAGNOSIS — M6281 Muscle weakness (generalized): Secondary | ICD-10-CM | POA: Diagnosis not present

## 2021-10-16 DIAGNOSIS — F419 Anxiety disorder, unspecified: Secondary | ICD-10-CM | POA: Diagnosis not present

## 2021-10-16 DIAGNOSIS — I10 Essential (primary) hypertension: Secondary | ICD-10-CM | POA: Diagnosis not present

## 2021-10-16 DIAGNOSIS — E039 Hypothyroidism, unspecified: Secondary | ICD-10-CM | POA: Diagnosis not present

## 2021-10-17 DIAGNOSIS — G9341 Metabolic encephalopathy: Secondary | ICD-10-CM | POA: Diagnosis not present

## 2021-10-17 DIAGNOSIS — I480 Paroxysmal atrial fibrillation: Secondary | ICD-10-CM | POA: Diagnosis not present

## 2021-10-17 DIAGNOSIS — E039 Hypothyroidism, unspecified: Secondary | ICD-10-CM | POA: Diagnosis not present

## 2021-10-17 DIAGNOSIS — F419 Anxiety disorder, unspecified: Secondary | ICD-10-CM | POA: Diagnosis not present

## 2021-10-17 DIAGNOSIS — I5022 Chronic systolic (congestive) heart failure: Secondary | ICD-10-CM | POA: Diagnosis not present

## 2021-10-17 DIAGNOSIS — I10 Essential (primary) hypertension: Secondary | ICD-10-CM | POA: Diagnosis not present

## 2021-10-17 DIAGNOSIS — G4733 Obstructive sleep apnea (adult) (pediatric): Secondary | ICD-10-CM | POA: Diagnosis not present

## 2021-10-17 DIAGNOSIS — M6281 Muscle weakness (generalized): Secondary | ICD-10-CM | POA: Diagnosis not present

## 2021-10-17 DIAGNOSIS — N179 Acute kidney failure, unspecified: Secondary | ICD-10-CM | POA: Diagnosis not present

## 2021-10-18 DIAGNOSIS — M6281 Muscle weakness (generalized): Secondary | ICD-10-CM | POA: Diagnosis not present

## 2021-10-18 DIAGNOSIS — G4700 Insomnia, unspecified: Secondary | ICD-10-CM | POA: Diagnosis not present

## 2021-10-18 DIAGNOSIS — I480 Paroxysmal atrial fibrillation: Secondary | ICD-10-CM | POA: Diagnosis not present

## 2021-10-18 DIAGNOSIS — G4733 Obstructive sleep apnea (adult) (pediatric): Secondary | ICD-10-CM | POA: Diagnosis not present

## 2021-10-18 DIAGNOSIS — E039 Hypothyroidism, unspecified: Secondary | ICD-10-CM | POA: Diagnosis not present

## 2021-10-18 DIAGNOSIS — G9341 Metabolic encephalopathy: Secondary | ICD-10-CM | POA: Diagnosis not present

## 2021-10-18 DIAGNOSIS — I10 Essential (primary) hypertension: Secondary | ICD-10-CM | POA: Diagnosis not present

## 2021-10-18 DIAGNOSIS — N179 Acute kidney failure, unspecified: Secondary | ICD-10-CM | POA: Diagnosis not present

## 2021-10-18 DIAGNOSIS — I4891 Unspecified atrial fibrillation: Secondary | ICD-10-CM | POA: Diagnosis not present

## 2021-10-18 DIAGNOSIS — I5022 Chronic systolic (congestive) heart failure: Secondary | ICD-10-CM | POA: Diagnosis not present

## 2021-10-18 DIAGNOSIS — F419 Anxiety disorder, unspecified: Secondary | ICD-10-CM | POA: Diagnosis not present

## 2021-10-18 DIAGNOSIS — I5020 Unspecified systolic (congestive) heart failure: Secondary | ICD-10-CM | POA: Diagnosis not present

## 2021-10-19 DIAGNOSIS — G9341 Metabolic encephalopathy: Secondary | ICD-10-CM | POA: Diagnosis not present

## 2021-10-19 DIAGNOSIS — F419 Anxiety disorder, unspecified: Secondary | ICD-10-CM | POA: Diagnosis not present

## 2021-10-19 DIAGNOSIS — G4733 Obstructive sleep apnea (adult) (pediatric): Secondary | ICD-10-CM | POA: Diagnosis not present

## 2021-10-19 DIAGNOSIS — E039 Hypothyroidism, unspecified: Secondary | ICD-10-CM | POA: Diagnosis not present

## 2021-10-19 DIAGNOSIS — M6281 Muscle weakness (generalized): Secondary | ICD-10-CM | POA: Diagnosis not present

## 2021-10-19 DIAGNOSIS — I5022 Chronic systolic (congestive) heart failure: Secondary | ICD-10-CM | POA: Diagnosis not present

## 2021-10-19 DIAGNOSIS — N179 Acute kidney failure, unspecified: Secondary | ICD-10-CM | POA: Diagnosis not present

## 2021-10-19 DIAGNOSIS — I480 Paroxysmal atrial fibrillation: Secondary | ICD-10-CM | POA: Diagnosis not present

## 2021-10-19 DIAGNOSIS — I10 Essential (primary) hypertension: Secondary | ICD-10-CM | POA: Diagnosis not present

## 2021-10-21 DIAGNOSIS — I10 Essential (primary) hypertension: Secondary | ICD-10-CM | POA: Diagnosis not present

## 2021-10-21 DIAGNOSIS — N179 Acute kidney failure, unspecified: Secondary | ICD-10-CM | POA: Diagnosis not present

## 2021-10-21 DIAGNOSIS — G4733 Obstructive sleep apnea (adult) (pediatric): Secondary | ICD-10-CM | POA: Diagnosis not present

## 2021-10-21 DIAGNOSIS — F419 Anxiety disorder, unspecified: Secondary | ICD-10-CM | POA: Diagnosis not present

## 2021-10-21 DIAGNOSIS — I480 Paroxysmal atrial fibrillation: Secondary | ICD-10-CM | POA: Diagnosis not present

## 2021-10-21 DIAGNOSIS — E039 Hypothyroidism, unspecified: Secondary | ICD-10-CM | POA: Diagnosis not present

## 2021-10-21 DIAGNOSIS — I5022 Chronic systolic (congestive) heart failure: Secondary | ICD-10-CM | POA: Diagnosis not present

## 2021-10-21 DIAGNOSIS — G9341 Metabolic encephalopathy: Secondary | ICD-10-CM | POA: Diagnosis not present

## 2021-10-21 DIAGNOSIS — M6281 Muscle weakness (generalized): Secondary | ICD-10-CM | POA: Diagnosis not present

## 2021-10-22 DIAGNOSIS — N179 Acute kidney failure, unspecified: Secondary | ICD-10-CM | POA: Diagnosis not present

## 2021-10-22 DIAGNOSIS — G4733 Obstructive sleep apnea (adult) (pediatric): Secondary | ICD-10-CM | POA: Diagnosis not present

## 2021-10-22 DIAGNOSIS — I480 Paroxysmal atrial fibrillation: Secondary | ICD-10-CM | POA: Diagnosis not present

## 2021-10-22 DIAGNOSIS — I5022 Chronic systolic (congestive) heart failure: Secondary | ICD-10-CM | POA: Diagnosis not present

## 2021-10-22 DIAGNOSIS — F419 Anxiety disorder, unspecified: Secondary | ICD-10-CM | POA: Diagnosis not present

## 2021-10-22 DIAGNOSIS — I10 Essential (primary) hypertension: Secondary | ICD-10-CM | POA: Diagnosis not present

## 2021-10-22 DIAGNOSIS — G9341 Metabolic encephalopathy: Secondary | ICD-10-CM | POA: Diagnosis not present

## 2021-10-22 DIAGNOSIS — E039 Hypothyroidism, unspecified: Secondary | ICD-10-CM | POA: Diagnosis not present

## 2021-10-22 DIAGNOSIS — M6281 Muscle weakness (generalized): Secondary | ICD-10-CM | POA: Diagnosis not present

## 2021-10-23 DIAGNOSIS — I5022 Chronic systolic (congestive) heart failure: Secondary | ICD-10-CM | POA: Diagnosis not present

## 2021-10-23 DIAGNOSIS — I10 Essential (primary) hypertension: Secondary | ICD-10-CM | POA: Diagnosis not present

## 2021-10-23 DIAGNOSIS — I480 Paroxysmal atrial fibrillation: Secondary | ICD-10-CM | POA: Diagnosis not present

## 2021-10-23 DIAGNOSIS — F419 Anxiety disorder, unspecified: Secondary | ICD-10-CM | POA: Diagnosis not present

## 2021-10-23 DIAGNOSIS — M1990 Unspecified osteoarthritis, unspecified site: Secondary | ICD-10-CM | POA: Diagnosis not present

## 2021-10-23 DIAGNOSIS — G9341 Metabolic encephalopathy: Secondary | ICD-10-CM | POA: Diagnosis not present

## 2021-10-23 DIAGNOSIS — E039 Hypothyroidism, unspecified: Secondary | ICD-10-CM | POA: Diagnosis not present

## 2021-10-23 DIAGNOSIS — I4891 Unspecified atrial fibrillation: Secondary | ICD-10-CM | POA: Diagnosis not present

## 2021-10-23 DIAGNOSIS — N179 Acute kidney failure, unspecified: Secondary | ICD-10-CM | POA: Diagnosis not present

## 2021-10-23 DIAGNOSIS — M6281 Muscle weakness (generalized): Secondary | ICD-10-CM | POA: Diagnosis not present

## 2021-10-23 DIAGNOSIS — G629 Polyneuropathy, unspecified: Secondary | ICD-10-CM | POA: Diagnosis not present

## 2021-10-23 DIAGNOSIS — I5020 Unspecified systolic (congestive) heart failure: Secondary | ICD-10-CM | POA: Diagnosis not present

## 2021-10-23 DIAGNOSIS — G4733 Obstructive sleep apnea (adult) (pediatric): Secondary | ICD-10-CM | POA: Diagnosis not present

## 2021-10-24 ENCOUNTER — Telehealth: Payer: Self-pay

## 2021-10-24 ENCOUNTER — Encounter: Payer: Self-pay | Admitting: Neurology

## 2021-10-24 DIAGNOSIS — E039 Hypothyroidism, unspecified: Secondary | ICD-10-CM | POA: Diagnosis not present

## 2021-10-24 DIAGNOSIS — F419 Anxiety disorder, unspecified: Secondary | ICD-10-CM | POA: Diagnosis not present

## 2021-10-24 DIAGNOSIS — G4733 Obstructive sleep apnea (adult) (pediatric): Secondary | ICD-10-CM | POA: Diagnosis not present

## 2021-10-24 DIAGNOSIS — I10 Essential (primary) hypertension: Secondary | ICD-10-CM | POA: Diagnosis not present

## 2021-10-24 DIAGNOSIS — G9341 Metabolic encephalopathy: Secondary | ICD-10-CM | POA: Diagnosis not present

## 2021-10-24 DIAGNOSIS — I5022 Chronic systolic (congestive) heart failure: Secondary | ICD-10-CM | POA: Diagnosis not present

## 2021-10-24 DIAGNOSIS — M6281 Muscle weakness (generalized): Secondary | ICD-10-CM | POA: Diagnosis not present

## 2021-10-24 DIAGNOSIS — I480 Paroxysmal atrial fibrillation: Secondary | ICD-10-CM | POA: Diagnosis not present

## 2021-10-24 DIAGNOSIS — N179 Acute kidney failure, unspecified: Secondary | ICD-10-CM | POA: Diagnosis not present

## 2021-10-24 NOTE — Telephone Encounter (Signed)
Telephone call to [facility], [place] approved 10/01/21-10/31/21. Auth# [id number]. Rep Name Aileen Pilot, Ref# Aileen Pilot 10/24/21 at 3:59 pm.

## 2021-10-25 DIAGNOSIS — F419 Anxiety disorder, unspecified: Secondary | ICD-10-CM | POA: Diagnosis not present

## 2021-10-25 DIAGNOSIS — I10 Essential (primary) hypertension: Secondary | ICD-10-CM | POA: Diagnosis not present

## 2021-10-25 DIAGNOSIS — I5022 Chronic systolic (congestive) heart failure: Secondary | ICD-10-CM | POA: Diagnosis not present

## 2021-10-25 DIAGNOSIS — G9341 Metabolic encephalopathy: Secondary | ICD-10-CM | POA: Diagnosis not present

## 2021-10-25 DIAGNOSIS — E039 Hypothyroidism, unspecified: Secondary | ICD-10-CM | POA: Diagnosis not present

## 2021-10-25 DIAGNOSIS — N179 Acute kidney failure, unspecified: Secondary | ICD-10-CM | POA: Diagnosis not present

## 2021-10-25 DIAGNOSIS — M6281 Muscle weakness (generalized): Secondary | ICD-10-CM | POA: Diagnosis not present

## 2021-10-25 DIAGNOSIS — I480 Paroxysmal atrial fibrillation: Secondary | ICD-10-CM | POA: Diagnosis not present

## 2021-10-25 DIAGNOSIS — G4733 Obstructive sleep apnea (adult) (pediatric): Secondary | ICD-10-CM | POA: Diagnosis not present

## 2021-10-26 ENCOUNTER — Telehealth: Payer: Self-pay | Admitting: Neurology

## 2021-10-26 DIAGNOSIS — F39 Unspecified mood [affective] disorder: Secondary | ICD-10-CM | POA: Diagnosis not present

## 2021-10-26 DIAGNOSIS — I480 Paroxysmal atrial fibrillation: Secondary | ICD-10-CM | POA: Diagnosis not present

## 2021-10-26 DIAGNOSIS — G4733 Obstructive sleep apnea (adult) (pediatric): Secondary | ICD-10-CM | POA: Diagnosis not present

## 2021-10-26 DIAGNOSIS — F419 Anxiety disorder, unspecified: Secondary | ICD-10-CM | POA: Diagnosis not present

## 2021-10-26 DIAGNOSIS — I10 Essential (primary) hypertension: Secondary | ICD-10-CM | POA: Diagnosis not present

## 2021-10-26 DIAGNOSIS — G9341 Metabolic encephalopathy: Secondary | ICD-10-CM | POA: Diagnosis not present

## 2021-10-26 DIAGNOSIS — E039 Hypothyroidism, unspecified: Secondary | ICD-10-CM | POA: Diagnosis not present

## 2021-10-26 DIAGNOSIS — I5022 Chronic systolic (congestive) heart failure: Secondary | ICD-10-CM | POA: Diagnosis not present

## 2021-10-26 DIAGNOSIS — M6281 Muscle weakness (generalized): Secondary | ICD-10-CM | POA: Diagnosis not present

## 2021-10-26 DIAGNOSIS — N179 Acute kidney failure, unspecified: Secondary | ICD-10-CM | POA: Diagnosis not present

## 2021-10-26 NOTE — Telephone Encounter (Signed)
Suanne Marker from [place] called stating patient wife told her that the scan on 10-31-21 would take 3 hours and that Dr Tat said that he would not have to do the scan we could do a skin Biopsy or something in the office to get the results. She is wanting to make sure that is correct so she can call and cancel the scan and schedule something with our office  please call

## 2021-10-28 DIAGNOSIS — M6281 Muscle weakness (generalized): Secondary | ICD-10-CM | POA: Diagnosis not present

## 2021-10-28 DIAGNOSIS — E039 Hypothyroidism, unspecified: Secondary | ICD-10-CM | POA: Diagnosis not present

## 2021-10-28 DIAGNOSIS — I10 Essential (primary) hypertension: Secondary | ICD-10-CM | POA: Diagnosis not present

## 2021-10-28 DIAGNOSIS — F419 Anxiety disorder, unspecified: Secondary | ICD-10-CM | POA: Diagnosis not present

## 2021-10-28 DIAGNOSIS — G4733 Obstructive sleep apnea (adult) (pediatric): Secondary | ICD-10-CM | POA: Diagnosis not present

## 2021-10-28 DIAGNOSIS — N179 Acute kidney failure, unspecified: Secondary | ICD-10-CM | POA: Diagnosis not present

## 2021-10-28 DIAGNOSIS — G9341 Metabolic encephalopathy: Secondary | ICD-10-CM | POA: Diagnosis not present

## 2021-10-28 DIAGNOSIS — I5022 Chronic systolic (congestive) heart failure: Secondary | ICD-10-CM | POA: Diagnosis not present

## 2021-10-28 DIAGNOSIS — I480 Paroxysmal atrial fibrillation: Secondary | ICD-10-CM | POA: Diagnosis not present

## 2021-10-29 DIAGNOSIS — M6281 Muscle weakness (generalized): Secondary | ICD-10-CM | POA: Diagnosis not present

## 2021-10-29 DIAGNOSIS — G4733 Obstructive sleep apnea (adult) (pediatric): Secondary | ICD-10-CM | POA: Diagnosis not present

## 2021-10-29 DIAGNOSIS — E039 Hypothyroidism, unspecified: Secondary | ICD-10-CM | POA: Diagnosis not present

## 2021-10-29 DIAGNOSIS — I10 Essential (primary) hypertension: Secondary | ICD-10-CM | POA: Diagnosis not present

## 2021-10-29 DIAGNOSIS — I5022 Chronic systolic (congestive) heart failure: Secondary | ICD-10-CM | POA: Diagnosis not present

## 2021-10-29 DIAGNOSIS — I480 Paroxysmal atrial fibrillation: Secondary | ICD-10-CM | POA: Diagnosis not present

## 2021-10-29 DIAGNOSIS — F419 Anxiety disorder, unspecified: Secondary | ICD-10-CM | POA: Diagnosis not present

## 2021-10-29 DIAGNOSIS — N179 Acute kidney failure, unspecified: Secondary | ICD-10-CM | POA: Diagnosis not present

## 2021-10-29 DIAGNOSIS — G9341 Metabolic encephalopathy: Secondary | ICD-10-CM | POA: Diagnosis not present

## 2021-10-30 DIAGNOSIS — N179 Acute kidney failure, unspecified: Secondary | ICD-10-CM | POA: Diagnosis not present

## 2021-10-30 DIAGNOSIS — F419 Anxiety disorder, unspecified: Secondary | ICD-10-CM | POA: Diagnosis not present

## 2021-10-30 DIAGNOSIS — E039 Hypothyroidism, unspecified: Secondary | ICD-10-CM | POA: Diagnosis not present

## 2021-10-30 DIAGNOSIS — I480 Paroxysmal atrial fibrillation: Secondary | ICD-10-CM | POA: Diagnosis not present

## 2021-10-30 DIAGNOSIS — I10 Essential (primary) hypertension: Secondary | ICD-10-CM | POA: Diagnosis not present

## 2021-10-30 DIAGNOSIS — M6281 Muscle weakness (generalized): Secondary | ICD-10-CM | POA: Diagnosis not present

## 2021-10-30 DIAGNOSIS — M1990 Unspecified osteoarthritis, unspecified site: Secondary | ICD-10-CM | POA: Diagnosis not present

## 2021-10-30 DIAGNOSIS — I4891 Unspecified atrial fibrillation: Secondary | ICD-10-CM | POA: Diagnosis not present

## 2021-10-30 DIAGNOSIS — I5020 Unspecified systolic (congestive) heart failure: Secondary | ICD-10-CM | POA: Diagnosis not present

## 2021-10-30 DIAGNOSIS — G9341 Metabolic encephalopathy: Secondary | ICD-10-CM | POA: Diagnosis not present

## 2021-10-30 DIAGNOSIS — I5022 Chronic systolic (congestive) heart failure: Secondary | ICD-10-CM | POA: Diagnosis not present

## 2021-10-30 DIAGNOSIS — G4733 Obstructive sleep apnea (adult) (pediatric): Secondary | ICD-10-CM | POA: Diagnosis not present

## 2021-10-30 NOTE — Telephone Encounter (Signed)
Spoke with [name] informed her that Dr Tat recommend to proceed with the scan. We did not tell them it would take 3 hour but radiology may have. [name] state that they will get him there, the appointment is scheduled for 10/31/2021

## 2021-10-31 ENCOUNTER — Encounter (HOSPITAL_COMMUNITY)
Admission: RE | Admit: 2021-10-31 | Discharge: 2021-10-31 | Disposition: A | Discharge status: Home / Self Care | Payer: Medicare HMO | Source: Ambulatory Visit | Attending: Neurology | Admitting: Neurology

## 2021-10-31 DIAGNOSIS — F419 Anxiety disorder, unspecified: Secondary | ICD-10-CM | POA: Diagnosis not present

## 2021-10-31 DIAGNOSIS — G20 Parkinson's disease: Secondary | ICD-10-CM | POA: Diagnosis not present

## 2021-10-31 DIAGNOSIS — M6281 Muscle weakness (generalized): Secondary | ICD-10-CM | POA: Diagnosis not present

## 2021-10-31 DIAGNOSIS — G3109 Other frontotemporal dementia: Secondary | ICD-10-CM | POA: Diagnosis not present

## 2021-10-31 DIAGNOSIS — I480 Paroxysmal atrial fibrillation: Secondary | ICD-10-CM | POA: Diagnosis not present

## 2021-10-31 DIAGNOSIS — N179 Acute kidney failure, unspecified: Secondary | ICD-10-CM | POA: Diagnosis not present

## 2021-10-31 DIAGNOSIS — F0280 Dementia in other diseases classified elsewhere without behavioral disturbance: Secondary | ICD-10-CM | POA: Diagnosis not present

## 2021-10-31 DIAGNOSIS — G9341 Metabolic encephalopathy: Secondary | ICD-10-CM | POA: Diagnosis not present

## 2021-10-31 DIAGNOSIS — E039 Hypothyroidism, unspecified: Secondary | ICD-10-CM | POA: Diagnosis not present

## 2021-10-31 DIAGNOSIS — I10 Essential (primary) hypertension: Secondary | ICD-10-CM | POA: Diagnosis not present

## 2021-10-31 DIAGNOSIS — G4733 Obstructive sleep apnea (adult) (pediatric): Secondary | ICD-10-CM | POA: Diagnosis not present

## 2021-10-31 DIAGNOSIS — I5022 Chronic systolic (congestive) heart failure: Secondary | ICD-10-CM | POA: Diagnosis not present

## 2021-10-31 LAB — GLUCOSE, CAPILLARY: Glucose-Capillary: 104 mg/dL — ABNORMAL HIGH (ref 70–99)

## 2021-10-31 MED ORDER — FLUDEOXYGLUCOSE F - 18 (FDG) INJECTION
10.0000 | Freq: Once | INTRAVENOUS | Status: AC
  Administered 2021-10-31: 9.33 via INTRAVENOUS

## 2021-11-01 DIAGNOSIS — I480 Paroxysmal atrial fibrillation: Secondary | ICD-10-CM | POA: Diagnosis not present

## 2021-11-01 DIAGNOSIS — M6281 Muscle weakness (generalized): Secondary | ICD-10-CM | POA: Diagnosis not present

## 2021-11-01 DIAGNOSIS — N179 Acute kidney failure, unspecified: Secondary | ICD-10-CM | POA: Diagnosis not present

## 2021-11-01 DIAGNOSIS — F419 Anxiety disorder, unspecified: Secondary | ICD-10-CM | POA: Diagnosis not present

## 2021-11-01 DIAGNOSIS — E039 Hypothyroidism, unspecified: Secondary | ICD-10-CM | POA: Diagnosis not present

## 2021-11-01 DIAGNOSIS — I10 Essential (primary) hypertension: Secondary | ICD-10-CM | POA: Diagnosis not present

## 2021-11-01 DIAGNOSIS — I5022 Chronic systolic (congestive) heart failure: Secondary | ICD-10-CM | POA: Diagnosis not present

## 2021-11-01 DIAGNOSIS — G9341 Metabolic encephalopathy: Secondary | ICD-10-CM | POA: Diagnosis not present

## 2021-11-01 DIAGNOSIS — R1311 Dysphagia, oral phase: Secondary | ICD-10-CM | POA: Diagnosis not present

## 2021-11-05 ENCOUNTER — Telehealth: Payer: Self-pay | Admitting: Neurology

## 2021-11-05 DIAGNOSIS — N179 Acute kidney failure, unspecified: Secondary | ICD-10-CM | POA: Diagnosis not present

## 2021-11-05 DIAGNOSIS — I480 Paroxysmal atrial fibrillation: Secondary | ICD-10-CM | POA: Diagnosis not present

## 2021-11-05 DIAGNOSIS — F419 Anxiety disorder, unspecified: Secondary | ICD-10-CM | POA: Diagnosis not present

## 2021-11-05 DIAGNOSIS — G9341 Metabolic encephalopathy: Secondary | ICD-10-CM | POA: Diagnosis not present

## 2021-11-05 DIAGNOSIS — I10 Essential (primary) hypertension: Secondary | ICD-10-CM | POA: Diagnosis not present

## 2021-11-05 DIAGNOSIS — I5022 Chronic systolic (congestive) heart failure: Secondary | ICD-10-CM | POA: Diagnosis not present

## 2021-11-05 DIAGNOSIS — M6281 Muscle weakness (generalized): Secondary | ICD-10-CM | POA: Diagnosis not present

## 2021-11-05 DIAGNOSIS — R1311 Dysphagia, oral phase: Secondary | ICD-10-CM | POA: Diagnosis not present

## 2021-11-05 DIAGNOSIS — E039 Hypothyroidism, unspecified: Secondary | ICD-10-CM | POA: Diagnosis not present

## 2021-11-05 NOTE — Telephone Encounter (Signed)
His PET scan was normal.  Needs f/u at some point (not urgent but don't want him waiting months) to discuss next steps

## 2021-11-05 NOTE — Telephone Encounter (Signed)
Pt is sch for 11-12-21 at 8:45 with Dr Tat

## 2021-11-06 ENCOUNTER — Encounter: Payer: Self-pay | Admitting: Neurology

## 2021-11-06 DIAGNOSIS — N179 Acute kidney failure, unspecified: Secondary | ICD-10-CM | POA: Diagnosis not present

## 2021-11-06 DIAGNOSIS — I5022 Chronic systolic (congestive) heart failure: Secondary | ICD-10-CM | POA: Diagnosis not present

## 2021-11-06 DIAGNOSIS — I480 Paroxysmal atrial fibrillation: Secondary | ICD-10-CM | POA: Diagnosis not present

## 2021-11-06 DIAGNOSIS — F419 Anxiety disorder, unspecified: Secondary | ICD-10-CM | POA: Diagnosis not present

## 2021-11-06 DIAGNOSIS — M6281 Muscle weakness (generalized): Secondary | ICD-10-CM | POA: Diagnosis not present

## 2021-11-06 DIAGNOSIS — I10 Essential (primary) hypertension: Secondary | ICD-10-CM | POA: Diagnosis not present

## 2021-11-06 DIAGNOSIS — E039 Hypothyroidism, unspecified: Secondary | ICD-10-CM | POA: Diagnosis not present

## 2021-11-06 DIAGNOSIS — R1311 Dysphagia, oral phase: Secondary | ICD-10-CM | POA: Diagnosis not present

## 2021-11-06 DIAGNOSIS — G9341 Metabolic encephalopathy: Secondary | ICD-10-CM | POA: Diagnosis not present

## 2021-11-07 DIAGNOSIS — M6281 Muscle weakness (generalized): Secondary | ICD-10-CM | POA: Diagnosis not present

## 2021-11-07 DIAGNOSIS — G9341 Metabolic encephalopathy: Secondary | ICD-10-CM | POA: Diagnosis not present

## 2021-11-07 DIAGNOSIS — N179 Acute kidney failure, unspecified: Secondary | ICD-10-CM | POA: Diagnosis not present

## 2021-11-07 DIAGNOSIS — I480 Paroxysmal atrial fibrillation: Secondary | ICD-10-CM | POA: Diagnosis not present

## 2021-11-07 DIAGNOSIS — E039 Hypothyroidism, unspecified: Secondary | ICD-10-CM | POA: Diagnosis not present

## 2021-11-07 DIAGNOSIS — R1311 Dysphagia, oral phase: Secondary | ICD-10-CM | POA: Diagnosis not present

## 2021-11-07 DIAGNOSIS — I10 Essential (primary) hypertension: Secondary | ICD-10-CM | POA: Diagnosis not present

## 2021-11-07 DIAGNOSIS — F419 Anxiety disorder, unspecified: Secondary | ICD-10-CM | POA: Diagnosis not present

## 2021-11-07 DIAGNOSIS — I5022 Chronic systolic (congestive) heart failure: Secondary | ICD-10-CM | POA: Diagnosis not present

## 2021-11-08 DIAGNOSIS — E039 Hypothyroidism, unspecified: Secondary | ICD-10-CM | POA: Diagnosis not present

## 2021-11-08 DIAGNOSIS — R1311 Dysphagia, oral phase: Secondary | ICD-10-CM | POA: Diagnosis not present

## 2021-11-08 DIAGNOSIS — F419 Anxiety disorder, unspecified: Secondary | ICD-10-CM | POA: Diagnosis not present

## 2021-11-08 DIAGNOSIS — I10 Essential (primary) hypertension: Secondary | ICD-10-CM | POA: Diagnosis not present

## 2021-11-08 DIAGNOSIS — G9341 Metabolic encephalopathy: Secondary | ICD-10-CM | POA: Diagnosis not present

## 2021-11-08 DIAGNOSIS — I5022 Chronic systolic (congestive) heart failure: Secondary | ICD-10-CM | POA: Diagnosis not present

## 2021-11-08 DIAGNOSIS — M6281 Muscle weakness (generalized): Secondary | ICD-10-CM | POA: Diagnosis not present

## 2021-11-08 DIAGNOSIS — I480 Paroxysmal atrial fibrillation: Secondary | ICD-10-CM | POA: Diagnosis not present

## 2021-11-08 DIAGNOSIS — N179 Acute kidney failure, unspecified: Secondary | ICD-10-CM | POA: Diagnosis not present

## 2021-11-08 NOTE — Progress Notes (Unsigned)
Assessment/Plan:   1.  Parkinsonism  -***PET scan was negative, making behavioral variant of FTD much less likely  -Patient unable to have MRI due to pacemaker.  -Discussed DaTscan and skin biopsies.  2.  Panic attacks and anxiety, longstanding for well over a decade.  -Patient following with psychiatry. -Patient is on 2 different benzodiazepines, both Xanax and clonazepam, both twice per day.  PDMP is reviewed.  He received #60 clonazepam on May 25 and #60 alprazolam on May 18.  This is in addition to his #60 tramadol on May 1 and an additional 30 on May 30.  He is following with psychiatry.  When his Ativan was stopped previously, the patient ended up in the hospital with confusion and stiffness, but the patient also had pneumonia.  When his Ativan was given back, he did better, so that is the reason that psychiatry placed him back on benzodiazepine.  The Ativan made him too sleepy.  It is unclear to me why he is on 2 different benzodiazepines, both on a twice daily basis, however.   3.  Achalasia             -Patient has had Botox of the lower esophageal sphincter previously.  This is the source of swallow difficulties.   4.  History of GI bleed             -Follows with gastroenterology.  Subjective:   [name] was seen today in follow up for parkinsonism.  Last visit, we thought that perhaps patient had behavioral variant of FTD.  We decided to proceed with PET scan.  PET scan was normal.  He previously had tried levodopa, but landed in the hospital not long thereafter, but he had a number of mitigating factors, that included the fact that Ativan had been stopped, he had pneumonia at the time as well. Current prescribed movement disorder medications: ***   PREVIOUS MEDICATIONS: {Parkinson's RX:18200}  ALLERGIES:   Allergies  Allergen Reactions   Hydrochlorothiazide Other (See Comments)    "gout flares"   Lexapro [Escitalopram] Other (See Comments)    Ineffective for  anxiety (10/2011)   Lisinopril Swelling and Other (See Comments)    Angioedema    Silodosin Diarrhea   Tamsulosin Other (See Comments)    Visual changes    CURRENT MEDICATIONS:  No outpatient medications have been marked as taking for the 11/12/21 encounter (Appointment) with Jaekwon Mcclune, Eustace Quail, DO.     Objective:   PHYSICAL EXAMINATION:    VITALS:  There were no vitals filed for this visit.  GEN:  The patient appears stated age and is in NAD. HEENT:  Normocephalic, atraumatic.  The mucous membranes are moist. The superficial temporal arteries are without ropiness or tenderness. CV:  RRR Lungs:  CTAB Neck/HEME:  There are no carotid bruits bilaterally.  Neurological examination:  Orientation: The patient is alert and oriented x3. Cranial nerves: There is good facial symmetry with*** facial hypomimia. The speech is fluent and clear. Soft palate rises symmetrically and there is no tongue deviation. Hearing is intact to conversational tone. Sensation: Sensation is intact to light touch throughout Motor: Strength is at least antigravity x4.  Movement examination: Tone: There is ***tone in the *** Abnormal movements: *** Coordination:  There is *** decremation with RAM's, *** Gait and Station: The patient has *** difficulty arising out of a deep-seated chair without the use of the hands. The patient's stride length is ***.  The patient has a *** pull  test.     I have reviewed and interpreted the following labs independently    Chemistry      Component Value Date/Time   NA 132 (L) 08/04/2021 0718   NA 134 05/25/2021 1412   K 3.7 08/04/2021 0718   CL 96 (L) 08/04/2021 0718   CO2 26 08/04/2021 0718   BUN 29 (H) 08/04/2021 0718   BUN 17 05/25/2021 1412   CREATININE 1.46 (H) 08/04/2021 0718      Component Value Date/Time   CALCIUM 8.9 08/04/2021 0718   ALKPHOS 84 07/29/2021 1221   AST 20 07/29/2021 1221   ALT 14 07/29/2021 1221   BILITOT 1.6 (H) 07/29/2021 1221   BILITOT  1.3 (H) 05/25/2021 1412       Lab Results  Component Value Date   WBC 8.5 08/04/2021   HGB 11.0 (L) 08/04/2021   HCT 33.6 (L) 08/04/2021   MCV 89.6 08/04/2021   PLT 193 08/04/2021    Lab Results  Component Value Date   TSH 1.181 07/03/2021     Total time spent on today's visit was ***30 minutes, including both face-to-face time and nonface-to-face time.  Time included that spent on review of records (prior notes available to me/labs/imaging if pertinent), discussing treatment and goals, answering patient's questions and coordinating care.  Cc:  [name], MD

## 2021-11-09 DIAGNOSIS — N179 Acute kidney failure, unspecified: Secondary | ICD-10-CM | POA: Diagnosis not present

## 2021-11-09 DIAGNOSIS — I5022 Chronic systolic (congestive) heart failure: Secondary | ICD-10-CM | POA: Diagnosis not present

## 2021-11-09 DIAGNOSIS — M6281 Muscle weakness (generalized): Secondary | ICD-10-CM | POA: Diagnosis not present

## 2021-11-09 DIAGNOSIS — G9341 Metabolic encephalopathy: Secondary | ICD-10-CM | POA: Diagnosis not present

## 2021-11-09 DIAGNOSIS — I480 Paroxysmal atrial fibrillation: Secondary | ICD-10-CM | POA: Diagnosis not present

## 2021-11-09 DIAGNOSIS — I10 Essential (primary) hypertension: Secondary | ICD-10-CM | POA: Diagnosis not present

## 2021-11-09 DIAGNOSIS — R1311 Dysphagia, oral phase: Secondary | ICD-10-CM | POA: Diagnosis not present

## 2021-11-09 DIAGNOSIS — E039 Hypothyroidism, unspecified: Secondary | ICD-10-CM | POA: Diagnosis not present

## 2021-11-09 DIAGNOSIS — F419 Anxiety disorder, unspecified: Secondary | ICD-10-CM | POA: Diagnosis not present

## 2021-11-10 DIAGNOSIS — R1311 Dysphagia, oral phase: Secondary | ICD-10-CM | POA: Diagnosis not present

## 2021-11-10 DIAGNOSIS — I5022 Chronic systolic (congestive) heart failure: Secondary | ICD-10-CM | POA: Diagnosis not present

## 2021-11-10 DIAGNOSIS — F419 Anxiety disorder, unspecified: Secondary | ICD-10-CM | POA: Diagnosis not present

## 2021-11-10 DIAGNOSIS — E039 Hypothyroidism, unspecified: Secondary | ICD-10-CM | POA: Diagnosis not present

## 2021-11-10 DIAGNOSIS — G9341 Metabolic encephalopathy: Secondary | ICD-10-CM | POA: Diagnosis not present

## 2021-11-10 DIAGNOSIS — M6281 Muscle weakness (generalized): Secondary | ICD-10-CM | POA: Diagnosis not present

## 2021-11-10 DIAGNOSIS — I10 Essential (primary) hypertension: Secondary | ICD-10-CM | POA: Diagnosis not present

## 2021-11-10 DIAGNOSIS — I480 Paroxysmal atrial fibrillation: Secondary | ICD-10-CM | POA: Diagnosis not present

## 2021-11-10 DIAGNOSIS — N179 Acute kidney failure, unspecified: Secondary | ICD-10-CM | POA: Diagnosis not present

## 2021-11-12 ENCOUNTER — Ambulatory Visit (INDEPENDENT_AMBULATORY_CARE_PROVIDER_SITE_OTHER): Payer: Medicare HMO | Admitting: Neurology

## 2021-11-12 ENCOUNTER — Encounter: Payer: Self-pay | Admitting: Neurology

## 2021-11-12 VITALS — BP 120/68 | HR 88 | Ht 72.0 in

## 2021-11-12 DIAGNOSIS — R1311 Dysphagia, oral phase: Secondary | ICD-10-CM | POA: Diagnosis not present

## 2021-11-12 DIAGNOSIS — Z8719 Personal history of other diseases of the digestive system: Secondary | ICD-10-CM | POA: Diagnosis not present

## 2021-11-12 DIAGNOSIS — K220 Achalasia of cardia: Secondary | ICD-10-CM

## 2021-11-12 DIAGNOSIS — F410 Panic disorder [episodic paroxysmal anxiety] without agoraphobia: Secondary | ICD-10-CM

## 2021-11-12 DIAGNOSIS — R251 Tremor, unspecified: Secondary | ICD-10-CM | POA: Diagnosis not present

## 2021-11-12 DIAGNOSIS — E039 Hypothyroidism, unspecified: Secondary | ICD-10-CM | POA: Diagnosis not present

## 2021-11-12 DIAGNOSIS — I10 Essential (primary) hypertension: Secondary | ICD-10-CM | POA: Diagnosis not present

## 2021-11-12 DIAGNOSIS — I480 Paroxysmal atrial fibrillation: Secondary | ICD-10-CM | POA: Diagnosis not present

## 2021-11-12 DIAGNOSIS — G20 Parkinson's disease: Secondary | ICD-10-CM

## 2021-11-12 DIAGNOSIS — I5022 Chronic systolic (congestive) heart failure: Secondary | ICD-10-CM | POA: Diagnosis not present

## 2021-11-12 DIAGNOSIS — M6281 Muscle weakness (generalized): Secondary | ICD-10-CM | POA: Diagnosis not present

## 2021-11-12 DIAGNOSIS — F419 Anxiety disorder, unspecified: Secondary | ICD-10-CM | POA: Diagnosis not present

## 2021-11-12 DIAGNOSIS — G9341 Metabolic encephalopathy: Secondary | ICD-10-CM | POA: Diagnosis not present

## 2021-11-12 DIAGNOSIS — N179 Acute kidney failure, unspecified: Secondary | ICD-10-CM | POA: Diagnosis not present

## 2021-11-12 NOTE — Addendum Note (Signed)
Addended by: Jake Seats on: 11/12/2021 09:36 AM   Modules accepted: Orders

## 2021-11-12 NOTE — Patient Instructions (Signed)
As we discussed, we are going to do a DaT scan.  We discussed that this is not a diagnostic scan, but will just give us some information on dopamine levels in the brain.  Here is some information which may be helpful to you. ? ?Before the Exam ? ?Please tell the nurse, nuclear imaging technician or nuclear medicine physician if you are pregnant, nursing or have reduced liver function. ?Please also inform us if you have an allergy or sensitivity to iodine.  The test may be completed with those who are allergic to iodine, but may require pre-medication with other medications to help avoid reactions. ?If you need to cancel the examination, please give us at least 24 hours notice. ? ?On the Day of the Exam ?Drink plenty of fluids and go to the bathroom frequently (and for two days after your exam) ?Wear loose comfortable clothing, since you will need to lie still for a period of time. ?Please bring a list of all medications that you are taking; name and dosage. ?We want to make your waiting time as pleasant as possible. Consider bringing your favorite magazine, book or music player to help you pass the time.  You do not need to stay at the imaging facility the entire time, between the initial injection and the scan itself.   ?Please leave your jewelry and valuables at home. ? ?During the Exam ?The DaTscan once started takes approximately 30-45 minutes. However, following injection of the DaT agent approximately 3-6 hours are required before the agent has achieved appropriate concentration in the brain.  We will inject the DaTscan through an intravenous (IV) line into your arm in the AM, usually around 8-9am, and then you will come back usually in the mid afternoon for the scan. ?Before the exam, you will receive a drug to allow you to protect the thyroid. ?For the imaging test, you will be asked to lie on a table and an imaging technologist will position your head in a headrest. A strip of tape or a flexible restraint  may be placed around your head to help you to not move your head during the scan. ?A camera will be positioned above you and you must remain very still for about 30 minute while images are taken. The scanner will be very close to your head, but will not touch your head. ? ?

## 2021-11-13 DIAGNOSIS — M6281 Muscle weakness (generalized): Secondary | ICD-10-CM | POA: Diagnosis not present

## 2021-11-13 DIAGNOSIS — G9341 Metabolic encephalopathy: Secondary | ICD-10-CM | POA: Diagnosis not present

## 2021-11-13 DIAGNOSIS — E039 Hypothyroidism, unspecified: Secondary | ICD-10-CM | POA: Diagnosis not present

## 2021-11-13 DIAGNOSIS — I480 Paroxysmal atrial fibrillation: Secondary | ICD-10-CM | POA: Diagnosis not present

## 2021-11-13 DIAGNOSIS — I5022 Chronic systolic (congestive) heart failure: Secondary | ICD-10-CM | POA: Diagnosis not present

## 2021-11-13 DIAGNOSIS — R1311 Dysphagia, oral phase: Secondary | ICD-10-CM | POA: Diagnosis not present

## 2021-11-13 DIAGNOSIS — F419 Anxiety disorder, unspecified: Secondary | ICD-10-CM | POA: Diagnosis not present

## 2021-11-13 DIAGNOSIS — N179 Acute kidney failure, unspecified: Secondary | ICD-10-CM | POA: Diagnosis not present

## 2021-11-13 DIAGNOSIS — I10 Essential (primary) hypertension: Secondary | ICD-10-CM | POA: Diagnosis not present

## 2021-11-14 DIAGNOSIS — I5022 Chronic systolic (congestive) heart failure: Secondary | ICD-10-CM | POA: Diagnosis not present

## 2021-11-14 DIAGNOSIS — F419 Anxiety disorder, unspecified: Secondary | ICD-10-CM | POA: Diagnosis not present

## 2021-11-14 DIAGNOSIS — N179 Acute kidney failure, unspecified: Secondary | ICD-10-CM | POA: Diagnosis not present

## 2021-11-14 DIAGNOSIS — I480 Paroxysmal atrial fibrillation: Secondary | ICD-10-CM | POA: Diagnosis not present

## 2021-11-14 DIAGNOSIS — I10 Essential (primary) hypertension: Secondary | ICD-10-CM | POA: Diagnosis not present

## 2021-11-14 DIAGNOSIS — G9341 Metabolic encephalopathy: Secondary | ICD-10-CM | POA: Diagnosis not present

## 2021-11-14 DIAGNOSIS — M6281 Muscle weakness (generalized): Secondary | ICD-10-CM | POA: Diagnosis not present

## 2021-11-14 DIAGNOSIS — R1311 Dysphagia, oral phase: Secondary | ICD-10-CM | POA: Diagnosis not present

## 2021-11-14 DIAGNOSIS — E039 Hypothyroidism, unspecified: Secondary | ICD-10-CM | POA: Diagnosis not present

## 2021-11-15 DIAGNOSIS — E039 Hypothyroidism, unspecified: Secondary | ICD-10-CM | POA: Diagnosis not present

## 2021-11-15 DIAGNOSIS — I480 Paroxysmal atrial fibrillation: Secondary | ICD-10-CM | POA: Diagnosis not present

## 2021-11-15 DIAGNOSIS — F419 Anxiety disorder, unspecified: Secondary | ICD-10-CM | POA: Diagnosis not present

## 2021-11-15 DIAGNOSIS — N179 Acute kidney failure, unspecified: Secondary | ICD-10-CM | POA: Diagnosis not present

## 2021-11-15 DIAGNOSIS — I10 Essential (primary) hypertension: Secondary | ICD-10-CM | POA: Diagnosis not present

## 2021-11-15 DIAGNOSIS — R1311 Dysphagia, oral phase: Secondary | ICD-10-CM | POA: Diagnosis not present

## 2021-11-15 DIAGNOSIS — I5022 Chronic systolic (congestive) heart failure: Secondary | ICD-10-CM | POA: Diagnosis not present

## 2021-11-15 DIAGNOSIS — M6281 Muscle weakness (generalized): Secondary | ICD-10-CM | POA: Diagnosis not present

## 2021-11-15 DIAGNOSIS — G9341 Metabolic encephalopathy: Secondary | ICD-10-CM | POA: Diagnosis not present

## 2021-11-16 DIAGNOSIS — I5022 Chronic systolic (congestive) heart failure: Secondary | ICD-10-CM | POA: Diagnosis not present

## 2021-11-16 DIAGNOSIS — M6281 Muscle weakness (generalized): Secondary | ICD-10-CM | POA: Diagnosis not present

## 2021-11-16 DIAGNOSIS — I5030 Unspecified diastolic (congestive) heart failure: Secondary | ICD-10-CM | POA: Diagnosis not present

## 2021-11-16 DIAGNOSIS — R1311 Dysphagia, oral phase: Secondary | ICD-10-CM | POA: Diagnosis not present

## 2021-11-16 DIAGNOSIS — F0390 Unspecified dementia without behavioral disturbance: Secondary | ICD-10-CM | POA: Diagnosis not present

## 2021-11-16 DIAGNOSIS — I10 Essential (primary) hypertension: Secondary | ICD-10-CM | POA: Diagnosis not present

## 2021-11-16 DIAGNOSIS — N179 Acute kidney failure, unspecified: Secondary | ICD-10-CM | POA: Diagnosis not present

## 2021-11-16 DIAGNOSIS — N183 Chronic kidney disease, stage 3 unspecified: Secondary | ICD-10-CM | POA: Diagnosis not present

## 2021-11-16 DIAGNOSIS — I4891 Unspecified atrial fibrillation: Secondary | ICD-10-CM | POA: Diagnosis not present

## 2021-11-16 DIAGNOSIS — G9341 Metabolic encephalopathy: Secondary | ICD-10-CM | POA: Diagnosis not present

## 2021-11-16 DIAGNOSIS — F419 Anxiety disorder, unspecified: Secondary | ICD-10-CM | POA: Diagnosis not present

## 2021-11-16 DIAGNOSIS — E039 Hypothyroidism, unspecified: Secondary | ICD-10-CM | POA: Diagnosis not present

## 2021-11-16 DIAGNOSIS — I480 Paroxysmal atrial fibrillation: Secondary | ICD-10-CM | POA: Diagnosis not present

## 2021-11-18 DIAGNOSIS — M6281 Muscle weakness (generalized): Secondary | ICD-10-CM | POA: Diagnosis not present

## 2021-11-18 DIAGNOSIS — G9341 Metabolic encephalopathy: Secondary | ICD-10-CM | POA: Diagnosis not present

## 2021-11-18 DIAGNOSIS — E039 Hypothyroidism, unspecified: Secondary | ICD-10-CM | POA: Diagnosis not present

## 2021-11-18 DIAGNOSIS — F419 Anxiety disorder, unspecified: Secondary | ICD-10-CM | POA: Diagnosis not present

## 2021-11-18 DIAGNOSIS — I5022 Chronic systolic (congestive) heart failure: Secondary | ICD-10-CM | POA: Diagnosis not present

## 2021-11-18 DIAGNOSIS — R1311 Dysphagia, oral phase: Secondary | ICD-10-CM | POA: Diagnosis not present

## 2021-11-18 DIAGNOSIS — I10 Essential (primary) hypertension: Secondary | ICD-10-CM | POA: Diagnosis not present

## 2021-11-18 DIAGNOSIS — I480 Paroxysmal atrial fibrillation: Secondary | ICD-10-CM | POA: Diagnosis not present

## 2021-11-18 DIAGNOSIS — N179 Acute kidney failure, unspecified: Secondary | ICD-10-CM | POA: Diagnosis not present

## 2021-11-19 DIAGNOSIS — M6281 Muscle weakness (generalized): Secondary | ICD-10-CM | POA: Diagnosis not present

## 2021-11-19 DIAGNOSIS — R1311 Dysphagia, oral phase: Secondary | ICD-10-CM | POA: Diagnosis not present

## 2021-11-19 DIAGNOSIS — N179 Acute kidney failure, unspecified: Secondary | ICD-10-CM | POA: Diagnosis not present

## 2021-11-19 DIAGNOSIS — I480 Paroxysmal atrial fibrillation: Secondary | ICD-10-CM | POA: Diagnosis not present

## 2021-11-19 DIAGNOSIS — I5022 Chronic systolic (congestive) heart failure: Secondary | ICD-10-CM | POA: Diagnosis not present

## 2021-11-19 DIAGNOSIS — F419 Anxiety disorder, unspecified: Secondary | ICD-10-CM | POA: Diagnosis not present

## 2021-11-19 DIAGNOSIS — E039 Hypothyroidism, unspecified: Secondary | ICD-10-CM | POA: Diagnosis not present

## 2021-11-19 DIAGNOSIS — G9341 Metabolic encephalopathy: Secondary | ICD-10-CM | POA: Diagnosis not present

## 2021-11-19 DIAGNOSIS — I10 Essential (primary) hypertension: Secondary | ICD-10-CM | POA: Diagnosis not present

## 2021-11-20 DIAGNOSIS — I480 Paroxysmal atrial fibrillation: Secondary | ICD-10-CM | POA: Diagnosis not present

## 2021-11-20 DIAGNOSIS — F419 Anxiety disorder, unspecified: Secondary | ICD-10-CM | POA: Diagnosis not present

## 2021-11-20 DIAGNOSIS — G9341 Metabolic encephalopathy: Secondary | ICD-10-CM | POA: Diagnosis not present

## 2021-11-20 DIAGNOSIS — N179 Acute kidney failure, unspecified: Secondary | ICD-10-CM | POA: Diagnosis not present

## 2021-11-20 DIAGNOSIS — M65331 Trigger finger, right middle finger: Secondary | ICD-10-CM | POA: Diagnosis not present

## 2021-11-20 DIAGNOSIS — E039 Hypothyroidism, unspecified: Secondary | ICD-10-CM | POA: Diagnosis not present

## 2021-11-20 DIAGNOSIS — I5020 Unspecified systolic (congestive) heart failure: Secondary | ICD-10-CM | POA: Diagnosis not present

## 2021-11-20 DIAGNOSIS — I4891 Unspecified atrial fibrillation: Secondary | ICD-10-CM | POA: Diagnosis not present

## 2021-11-20 DIAGNOSIS — M6281 Muscle weakness (generalized): Secondary | ICD-10-CM | POA: Diagnosis not present

## 2021-11-20 DIAGNOSIS — I10 Essential (primary) hypertension: Secondary | ICD-10-CM | POA: Diagnosis not present

## 2021-11-20 DIAGNOSIS — M1990 Unspecified osteoarthritis, unspecified site: Secondary | ICD-10-CM | POA: Diagnosis not present

## 2021-11-20 DIAGNOSIS — I5022 Chronic systolic (congestive) heart failure: Secondary | ICD-10-CM | POA: Diagnosis not present

## 2021-11-20 DIAGNOSIS — R1311 Dysphagia, oral phase: Secondary | ICD-10-CM | POA: Diagnosis not present

## 2021-11-21 ENCOUNTER — Ambulatory Visit (INDEPENDENT_AMBULATORY_CARE_PROVIDER_SITE_OTHER): Payer: Medicare HMO

## 2021-11-21 DIAGNOSIS — F419 Anxiety disorder, unspecified: Secondary | ICD-10-CM | POA: Diagnosis not present

## 2021-11-21 DIAGNOSIS — I480 Paroxysmal atrial fibrillation: Secondary | ICD-10-CM | POA: Diagnosis not present

## 2021-11-21 DIAGNOSIS — I442 Atrioventricular block, complete: Secondary | ICD-10-CM

## 2021-11-21 DIAGNOSIS — E039 Hypothyroidism, unspecified: Secondary | ICD-10-CM | POA: Diagnosis not present

## 2021-11-21 DIAGNOSIS — N179 Acute kidney failure, unspecified: Secondary | ICD-10-CM | POA: Diagnosis not present

## 2021-11-21 DIAGNOSIS — R1311 Dysphagia, oral phase: Secondary | ICD-10-CM | POA: Diagnosis not present

## 2021-11-21 DIAGNOSIS — I10 Essential (primary) hypertension: Secondary | ICD-10-CM | POA: Diagnosis not present

## 2021-11-21 DIAGNOSIS — M6281 Muscle weakness (generalized): Secondary | ICD-10-CM | POA: Diagnosis not present

## 2021-11-21 DIAGNOSIS — G9341 Metabolic encephalopathy: Secondary | ICD-10-CM | POA: Diagnosis not present

## 2021-11-21 DIAGNOSIS — I5022 Chronic systolic (congestive) heart failure: Secondary | ICD-10-CM | POA: Diagnosis not present

## 2021-11-21 LAB — CUP PACEART REMOTE DEVICE CHECK
Battery Remaining Longevity: 56 mo
Battery Remaining Percentage: 58 %
Battery Voltage: 2.99 V
Brady Statistic AP VP Percent: 12 %
Brady Statistic AP VS Percent: 1 %
Brady Statistic AS VP Percent: 87 %
Brady Statistic AS VS Percent: 1 %
Brady Statistic RA Percent Paced: 11 %
Brady Statistic RV Percent Paced: 99 %
Date Time Interrogation Session: 20230620040022
Implantable Lead Implant Date: 20190624
Implantable Lead Implant Date: 20190624
Implantable Lead Location: 753859
Implantable Lead Location: 753860
Implantable Lead Model: 5076
Implantable Lead Model: 5076
Implantable Pulse Generator Implant Date: 20190624
Lead Channel Impedance Value: 430 Ohm
Lead Channel Impedance Value: 450 Ohm
Lead Channel Pacing Threshold Amplitude: 0.75 V
Lead Channel Pacing Threshold Amplitude: 0.75 V
Lead Channel Pacing Threshold Pulse Width: 0.5 ms
Lead Channel Pacing Threshold Pulse Width: 0.5 ms
Lead Channel Sensing Intrinsic Amplitude: 1.6 mV
Lead Channel Sensing Intrinsic Amplitude: 12 mV
Lead Channel Setting Pacing Amplitude: 2 V
Lead Channel Setting Pacing Amplitude: 2.5 V
Lead Channel Setting Pacing Pulse Width: 0.5 ms
Lead Channel Setting Sensing Sensitivity: 2 mV
Pulse Gen Model: 2272
Pulse Gen Serial Number: 9033095

## 2021-11-22 ENCOUNTER — Encounter: Payer: Self-pay | Admitting: Neurology

## 2021-11-22 DIAGNOSIS — I480 Paroxysmal atrial fibrillation: Secondary | ICD-10-CM | POA: Diagnosis not present

## 2021-11-22 DIAGNOSIS — I5022 Chronic systolic (congestive) heart failure: Secondary | ICD-10-CM | POA: Diagnosis not present

## 2021-11-22 DIAGNOSIS — M6281 Muscle weakness (generalized): Secondary | ICD-10-CM | POA: Diagnosis not present

## 2021-11-22 DIAGNOSIS — F419 Anxiety disorder, unspecified: Secondary | ICD-10-CM | POA: Diagnosis not present

## 2021-11-22 DIAGNOSIS — G9341 Metabolic encephalopathy: Secondary | ICD-10-CM | POA: Diagnosis not present

## 2021-11-22 DIAGNOSIS — N179 Acute kidney failure, unspecified: Secondary | ICD-10-CM | POA: Diagnosis not present

## 2021-11-22 DIAGNOSIS — E039 Hypothyroidism, unspecified: Secondary | ICD-10-CM | POA: Diagnosis not present

## 2021-11-22 DIAGNOSIS — R1311 Dysphagia, oral phase: Secondary | ICD-10-CM | POA: Diagnosis not present

## 2021-11-22 DIAGNOSIS — I10 Essential (primary) hypertension: Secondary | ICD-10-CM | POA: Diagnosis not present

## 2021-11-23 DIAGNOSIS — E039 Hypothyroidism, unspecified: Secondary | ICD-10-CM | POA: Diagnosis not present

## 2021-11-23 DIAGNOSIS — I10 Essential (primary) hypertension: Secondary | ICD-10-CM | POA: Diagnosis not present

## 2021-11-23 DIAGNOSIS — M6281 Muscle weakness (generalized): Secondary | ICD-10-CM | POA: Diagnosis not present

## 2021-11-23 DIAGNOSIS — I480 Paroxysmal atrial fibrillation: Secondary | ICD-10-CM | POA: Diagnosis not present

## 2021-11-23 DIAGNOSIS — F419 Anxiety disorder, unspecified: Secondary | ICD-10-CM | POA: Diagnosis not present

## 2021-11-23 DIAGNOSIS — I5022 Chronic systolic (congestive) heart failure: Secondary | ICD-10-CM | POA: Diagnosis not present

## 2021-11-23 DIAGNOSIS — G9341 Metabolic encephalopathy: Secondary | ICD-10-CM | POA: Diagnosis not present

## 2021-11-23 DIAGNOSIS — R1311 Dysphagia, oral phase: Secondary | ICD-10-CM | POA: Diagnosis not present

## 2021-11-23 DIAGNOSIS — N179 Acute kidney failure, unspecified: Secondary | ICD-10-CM | POA: Diagnosis not present

## 2021-11-26 DIAGNOSIS — M6530 Trigger finger, unspecified finger: Secondary | ICD-10-CM | POA: Diagnosis not present

## 2021-11-26 DIAGNOSIS — F419 Anxiety disorder, unspecified: Secondary | ICD-10-CM | POA: Diagnosis not present

## 2021-11-26 DIAGNOSIS — E039 Hypothyroidism, unspecified: Secondary | ICD-10-CM | POA: Diagnosis not present

## 2021-11-26 DIAGNOSIS — N179 Acute kidney failure, unspecified: Secondary | ICD-10-CM | POA: Diagnosis not present

## 2021-11-26 DIAGNOSIS — I10 Essential (primary) hypertension: Secondary | ICD-10-CM | POA: Diagnosis not present

## 2021-11-26 DIAGNOSIS — M25561 Pain in right knee: Secondary | ICD-10-CM | POA: Diagnosis not present

## 2021-11-26 DIAGNOSIS — M6281 Muscle weakness (generalized): Secondary | ICD-10-CM | POA: Diagnosis not present

## 2021-11-26 DIAGNOSIS — I5022 Chronic systolic (congestive) heart failure: Secondary | ICD-10-CM | POA: Diagnosis not present

## 2021-11-26 DIAGNOSIS — R1311 Dysphagia, oral phase: Secondary | ICD-10-CM | POA: Diagnosis not present

## 2021-11-26 DIAGNOSIS — I480 Paroxysmal atrial fibrillation: Secondary | ICD-10-CM | POA: Diagnosis not present

## 2021-11-26 DIAGNOSIS — G9341 Metabolic encephalopathy: Secondary | ICD-10-CM | POA: Diagnosis not present

## 2021-11-26 DIAGNOSIS — M25562 Pain in left knee: Secondary | ICD-10-CM | POA: Diagnosis not present

## 2021-11-26 DIAGNOSIS — M25511 Pain in right shoulder: Secondary | ICD-10-CM | POA: Diagnosis not present

## 2021-11-27 DIAGNOSIS — F419 Anxiety disorder, unspecified: Secondary | ICD-10-CM | POA: Diagnosis not present

## 2021-11-27 DIAGNOSIS — R1311 Dysphagia, oral phase: Secondary | ICD-10-CM | POA: Diagnosis not present

## 2021-11-27 DIAGNOSIS — N179 Acute kidney failure, unspecified: Secondary | ICD-10-CM | POA: Diagnosis not present

## 2021-11-27 DIAGNOSIS — I5022 Chronic systolic (congestive) heart failure: Secondary | ICD-10-CM | POA: Diagnosis not present

## 2021-11-27 DIAGNOSIS — I480 Paroxysmal atrial fibrillation: Secondary | ICD-10-CM | POA: Diagnosis not present

## 2021-11-27 DIAGNOSIS — M6281 Muscle weakness (generalized): Secondary | ICD-10-CM | POA: Diagnosis not present

## 2021-11-27 DIAGNOSIS — I10 Essential (primary) hypertension: Secondary | ICD-10-CM | POA: Diagnosis not present

## 2021-11-27 DIAGNOSIS — E039 Hypothyroidism, unspecified: Secondary | ICD-10-CM | POA: Diagnosis not present

## 2021-11-27 DIAGNOSIS — G9341 Metabolic encephalopathy: Secondary | ICD-10-CM | POA: Diagnosis not present

## 2021-11-28 ENCOUNTER — Telehealth: Payer: Self-pay | Admitting: Neurology

## 2021-11-28 DIAGNOSIS — I4891 Unspecified atrial fibrillation: Secondary | ICD-10-CM | POA: Diagnosis not present

## 2021-11-28 DIAGNOSIS — M6281 Muscle weakness (generalized): Secondary | ICD-10-CM | POA: Diagnosis not present

## 2021-11-28 DIAGNOSIS — G9341 Metabolic encephalopathy: Secondary | ICD-10-CM | POA: Diagnosis not present

## 2021-11-28 DIAGNOSIS — I5020 Unspecified systolic (congestive) heart failure: Secondary | ICD-10-CM | POA: Diagnosis not present

## 2021-11-28 DIAGNOSIS — R1311 Dysphagia, oral phase: Secondary | ICD-10-CM | POA: Diagnosis not present

## 2021-11-28 DIAGNOSIS — N179 Acute kidney failure, unspecified: Secondary | ICD-10-CM | POA: Diagnosis not present

## 2021-11-28 DIAGNOSIS — I10 Essential (primary) hypertension: Secondary | ICD-10-CM | POA: Diagnosis not present

## 2021-11-28 DIAGNOSIS — E039 Hypothyroidism, unspecified: Secondary | ICD-10-CM | POA: Diagnosis not present

## 2021-11-28 DIAGNOSIS — R252 Cramp and spasm: Secondary | ICD-10-CM | POA: Diagnosis not present

## 2021-11-28 DIAGNOSIS — F419 Anxiety disorder, unspecified: Secondary | ICD-10-CM | POA: Diagnosis not present

## 2021-11-28 DIAGNOSIS — I5022 Chronic systolic (congestive) heart failure: Secondary | ICD-10-CM | POA: Diagnosis not present

## 2021-11-28 DIAGNOSIS — I480 Paroxysmal atrial fibrillation: Secondary | ICD-10-CM | POA: Diagnosis not present

## 2021-11-28 NOTE — Telephone Encounter (Signed)
Patients wife is returning a call to someone

## 2021-11-28 NOTE — Telephone Encounter (Signed)
Called Pt and talk to wife told her to come to office when they get the new insurance card. She had not received it at this point. I told her it was important for [place] to get it  as soon as possible to get approval on it with this insurance. She also want to put her daughter Lenoria Chime) on papers to be able to speak to the office about her Daddy. I told her to fill out papers when she came to bring the new insurance.

## 2021-11-29 ENCOUNTER — Telehealth: Payer: Self-pay

## 2021-11-29 DIAGNOSIS — E039 Hypothyroidism, unspecified: Secondary | ICD-10-CM | POA: Diagnosis not present

## 2021-11-29 DIAGNOSIS — I5022 Chronic systolic (congestive) heart failure: Secondary | ICD-10-CM | POA: Diagnosis not present

## 2021-11-29 DIAGNOSIS — M6281 Muscle weakness (generalized): Secondary | ICD-10-CM | POA: Diagnosis not present

## 2021-11-29 DIAGNOSIS — R1311 Dysphagia, oral phase: Secondary | ICD-10-CM | POA: Diagnosis not present

## 2021-11-29 DIAGNOSIS — G9341 Metabolic encephalopathy: Secondary | ICD-10-CM | POA: Diagnosis not present

## 2021-11-29 DIAGNOSIS — I480 Paroxysmal atrial fibrillation: Secondary | ICD-10-CM | POA: Diagnosis not present

## 2021-11-29 DIAGNOSIS — I10 Essential (primary) hypertension: Secondary | ICD-10-CM | POA: Diagnosis not present

## 2021-11-29 DIAGNOSIS — F419 Anxiety disorder, unspecified: Secondary | ICD-10-CM | POA: Diagnosis not present

## 2021-11-29 DIAGNOSIS — N179 Acute kidney failure, unspecified: Secondary | ICD-10-CM | POA: Diagnosis not present

## 2021-11-29 NOTE — Telephone Encounter (Signed)
Called as patients daughter about getting insurance card ASAP Megan from [employer] had to called to ask

## 2021-11-30 ENCOUNTER — Telehealth: Payer: Self-pay

## 2021-11-30 DIAGNOSIS — R1311 Dysphagia, oral phase: Secondary | ICD-10-CM | POA: Diagnosis not present

## 2021-11-30 DIAGNOSIS — G9341 Metabolic encephalopathy: Secondary | ICD-10-CM | POA: Diagnosis not present

## 2021-11-30 DIAGNOSIS — E039 Hypothyroidism, unspecified: Secondary | ICD-10-CM | POA: Diagnosis not present

## 2021-11-30 DIAGNOSIS — I10 Essential (primary) hypertension: Secondary | ICD-10-CM | POA: Diagnosis not present

## 2021-11-30 DIAGNOSIS — N179 Acute kidney failure, unspecified: Secondary | ICD-10-CM | POA: Diagnosis not present

## 2021-11-30 DIAGNOSIS — I5022 Chronic systolic (congestive) heart failure: Secondary | ICD-10-CM | POA: Diagnosis not present

## 2021-11-30 DIAGNOSIS — I480 Paroxysmal atrial fibrillation: Secondary | ICD-10-CM | POA: Diagnosis not present

## 2021-11-30 DIAGNOSIS — M6281 Muscle weakness (generalized): Secondary | ICD-10-CM | POA: Diagnosis not present

## 2021-11-30 DIAGNOSIS — F419 Anxiety disorder, unspecified: Secondary | ICD-10-CM | POA: Diagnosis not present

## 2021-11-30 NOTE — Telephone Encounter (Signed)
New Card faxed to [facility] for approval

## 2021-12-02 DIAGNOSIS — G4733 Obstructive sleep apnea (adult) (pediatric): Secondary | ICD-10-CM | POA: Diagnosis not present

## 2021-12-02 DIAGNOSIS — I5020 Unspecified systolic (congestive) heart failure: Secondary | ICD-10-CM | POA: Diagnosis not present

## 2021-12-02 DIAGNOSIS — M6281 Muscle weakness (generalized): Secondary | ICD-10-CM | POA: Diagnosis not present

## 2021-12-02 DIAGNOSIS — G20 Parkinson's disease: Secondary | ICD-10-CM | POA: Diagnosis not present

## 2021-12-02 DIAGNOSIS — G629 Polyneuropathy, unspecified: Secondary | ICD-10-CM | POA: Diagnosis not present

## 2021-12-02 DIAGNOSIS — I10 Essential (primary) hypertension: Secondary | ICD-10-CM | POA: Diagnosis not present

## 2021-12-02 DIAGNOSIS — I4891 Unspecified atrial fibrillation: Secondary | ICD-10-CM | POA: Diagnosis not present

## 2021-12-02 DIAGNOSIS — M1990 Unspecified osteoarthritis, unspecified site: Secondary | ICD-10-CM | POA: Diagnosis not present

## 2021-12-03 DIAGNOSIS — R262 Difficulty in walking, not elsewhere classified: Secondary | ICD-10-CM | POA: Diagnosis not present

## 2021-12-03 DIAGNOSIS — G9341 Metabolic encephalopathy: Secondary | ICD-10-CM | POA: Diagnosis not present

## 2021-12-03 DIAGNOSIS — K220 Achalasia of cardia: Secondary | ICD-10-CM | POA: Diagnosis not present

## 2021-12-03 DIAGNOSIS — I4891 Unspecified atrial fibrillation: Secondary | ICD-10-CM | POA: Diagnosis not present

## 2021-12-03 DIAGNOSIS — E039 Hypothyroidism, unspecified: Secondary | ICD-10-CM | POA: Diagnosis not present

## 2021-12-03 DIAGNOSIS — I5020 Unspecified systolic (congestive) heart failure: Secondary | ICD-10-CM | POA: Diagnosis not present

## 2021-12-03 DIAGNOSIS — G4733 Obstructive sleep apnea (adult) (pediatric): Secondary | ICD-10-CM | POA: Diagnosis not present

## 2021-12-03 DIAGNOSIS — N179 Acute kidney failure, unspecified: Secondary | ICD-10-CM | POA: Diagnosis not present

## 2021-12-03 DIAGNOSIS — I10 Essential (primary) hypertension: Secondary | ICD-10-CM | POA: Diagnosis not present

## 2021-12-03 DIAGNOSIS — M6281 Muscle weakness (generalized): Secondary | ICD-10-CM | POA: Diagnosis not present

## 2021-12-03 DIAGNOSIS — M6259 Muscle wasting and atrophy, not elsewhere classified, multiple sites: Secondary | ICD-10-CM | POA: Diagnosis not present

## 2021-12-03 DIAGNOSIS — R2681 Unsteadiness on feet: Secondary | ICD-10-CM | POA: Diagnosis not present

## 2021-12-03 DIAGNOSIS — K5900 Constipation, unspecified: Secondary | ICD-10-CM | POA: Diagnosis not present

## 2021-12-03 DIAGNOSIS — I480 Paroxysmal atrial fibrillation: Secondary | ICD-10-CM | POA: Diagnosis not present

## 2021-12-03 DIAGNOSIS — R1312 Dysphagia, oropharyngeal phase: Secondary | ICD-10-CM | POA: Diagnosis not present

## 2021-12-03 DIAGNOSIS — I5022 Chronic systolic (congestive) heart failure: Secondary | ICD-10-CM | POA: Diagnosis not present

## 2021-12-03 NOTE — Progress Notes (Signed)
Remote pacemaker transmission.   

## 2021-12-05 ENCOUNTER — Ambulatory Visit (INDEPENDENT_AMBULATORY_CARE_PROVIDER_SITE_OTHER): Payer: Medicare Other

## 2021-12-05 DIAGNOSIS — G9341 Metabolic encephalopathy: Secondary | ICD-10-CM | POA: Diagnosis not present

## 2021-12-05 DIAGNOSIS — I442 Atrioventricular block, complete: Secondary | ICD-10-CM

## 2021-12-05 DIAGNOSIS — I5022 Chronic systolic (congestive) heart failure: Secondary | ICD-10-CM | POA: Diagnosis not present

## 2021-12-05 DIAGNOSIS — K220 Achalasia of cardia: Secondary | ICD-10-CM | POA: Diagnosis not present

## 2021-12-05 DIAGNOSIS — M6281 Muscle weakness (generalized): Secondary | ICD-10-CM | POA: Diagnosis not present

## 2021-12-05 DIAGNOSIS — R262 Difficulty in walking, not elsewhere classified: Secondary | ICD-10-CM | POA: Diagnosis not present

## 2021-12-05 DIAGNOSIS — E039 Hypothyroidism, unspecified: Secondary | ICD-10-CM | POA: Diagnosis not present

## 2021-12-05 DIAGNOSIS — R1312 Dysphagia, oropharyngeal phase: Secondary | ICD-10-CM | POA: Diagnosis not present

## 2021-12-05 DIAGNOSIS — G4733 Obstructive sleep apnea (adult) (pediatric): Secondary | ICD-10-CM | POA: Diagnosis not present

## 2021-12-05 DIAGNOSIS — I480 Paroxysmal atrial fibrillation: Secondary | ICD-10-CM | POA: Diagnosis not present

## 2021-12-05 DIAGNOSIS — R2681 Unsteadiness on feet: Secondary | ICD-10-CM | POA: Diagnosis not present

## 2021-12-05 DIAGNOSIS — M6259 Muscle wasting and atrophy, not elsewhere classified, multiple sites: Secondary | ICD-10-CM | POA: Diagnosis not present

## 2021-12-05 DIAGNOSIS — I10 Essential (primary) hypertension: Secondary | ICD-10-CM | POA: Diagnosis not present

## 2021-12-05 DIAGNOSIS — N179 Acute kidney failure, unspecified: Secondary | ICD-10-CM | POA: Diagnosis not present

## 2021-12-06 DIAGNOSIS — I5022 Chronic systolic (congestive) heart failure: Secondary | ICD-10-CM | POA: Diagnosis not present

## 2021-12-06 DIAGNOSIS — K220 Achalasia of cardia: Secondary | ICD-10-CM | POA: Diagnosis not present

## 2021-12-06 DIAGNOSIS — I480 Paroxysmal atrial fibrillation: Secondary | ICD-10-CM | POA: Diagnosis not present

## 2021-12-06 DIAGNOSIS — R1312 Dysphagia, oropharyngeal phase: Secondary | ICD-10-CM | POA: Diagnosis not present

## 2021-12-06 DIAGNOSIS — R262 Difficulty in walking, not elsewhere classified: Secondary | ICD-10-CM | POA: Diagnosis not present

## 2021-12-06 DIAGNOSIS — E039 Hypothyroidism, unspecified: Secondary | ICD-10-CM | POA: Diagnosis not present

## 2021-12-06 DIAGNOSIS — M6281 Muscle weakness (generalized): Secondary | ICD-10-CM | POA: Diagnosis not present

## 2021-12-06 DIAGNOSIS — G4733 Obstructive sleep apnea (adult) (pediatric): Secondary | ICD-10-CM | POA: Diagnosis not present

## 2021-12-06 DIAGNOSIS — G9341 Metabolic encephalopathy: Secondary | ICD-10-CM | POA: Diagnosis not present

## 2021-12-06 DIAGNOSIS — M6259 Muscle wasting and atrophy, not elsewhere classified, multiple sites: Secondary | ICD-10-CM | POA: Diagnosis not present

## 2021-12-06 DIAGNOSIS — R2681 Unsteadiness on feet: Secondary | ICD-10-CM | POA: Diagnosis not present

## 2021-12-06 DIAGNOSIS — I10 Essential (primary) hypertension: Secondary | ICD-10-CM | POA: Diagnosis not present

## 2021-12-06 DIAGNOSIS — N179 Acute kidney failure, unspecified: Secondary | ICD-10-CM | POA: Diagnosis not present

## 2021-12-07 ENCOUNTER — Encounter (HOSPITAL_COMMUNITY)
Admission: RE | Admit: 2021-12-07 | Discharge: 2021-12-07 | Disposition: A | Discharge status: Home / Self Care | Payer: Medicare Other | Source: Ambulatory Visit | Attending: Neurology | Admitting: Neurology

## 2021-12-07 DIAGNOSIS — G20 Parkinson's disease: Secondary | ICD-10-CM | POA: Diagnosis not present

## 2021-12-07 DIAGNOSIS — I480 Paroxysmal atrial fibrillation: Secondary | ICD-10-CM | POA: Diagnosis not present

## 2021-12-07 DIAGNOSIS — M6281 Muscle weakness (generalized): Secondary | ICD-10-CM | POA: Diagnosis not present

## 2021-12-07 DIAGNOSIS — R2681 Unsteadiness on feet: Secondary | ICD-10-CM | POA: Diagnosis not present

## 2021-12-07 DIAGNOSIS — R251 Tremor, unspecified: Secondary | ICD-10-CM | POA: Diagnosis not present

## 2021-12-07 DIAGNOSIS — G4733 Obstructive sleep apnea (adult) (pediatric): Secondary | ICD-10-CM | POA: Diagnosis not present

## 2021-12-07 DIAGNOSIS — I10 Essential (primary) hypertension: Secondary | ICD-10-CM | POA: Diagnosis not present

## 2021-12-07 DIAGNOSIS — N179 Acute kidney failure, unspecified: Secondary | ICD-10-CM | POA: Diagnosis not present

## 2021-12-07 DIAGNOSIS — M6259 Muscle wasting and atrophy, not elsewhere classified, multiple sites: Secondary | ICD-10-CM | POA: Diagnosis not present

## 2021-12-07 DIAGNOSIS — G9341 Metabolic encephalopathy: Secondary | ICD-10-CM | POA: Diagnosis not present

## 2021-12-07 DIAGNOSIS — R1312 Dysphagia, oropharyngeal phase: Secondary | ICD-10-CM | POA: Diagnosis not present

## 2021-12-07 DIAGNOSIS — I5022 Chronic systolic (congestive) heart failure: Secondary | ICD-10-CM | POA: Diagnosis not present

## 2021-12-07 DIAGNOSIS — K220 Achalasia of cardia: Secondary | ICD-10-CM | POA: Diagnosis not present

## 2021-12-07 DIAGNOSIS — R262 Difficulty in walking, not elsewhere classified: Secondary | ICD-10-CM | POA: Diagnosis not present

## 2021-12-07 DIAGNOSIS — E039 Hypothyroidism, unspecified: Secondary | ICD-10-CM | POA: Diagnosis not present

## 2021-12-07 MED ORDER — POTASSIUM IODIDE (ANTIDOTE) 130 MG PO TABS: 130.0000 mg | ORAL_TABLET | Freq: Once | ORAL | Status: DC

## 2021-12-07 MED ORDER — POTASSIUM IODIDE (ANTIDOTE) 130 MG PO TABS
ORAL_TABLET | ORAL | Status: AC
  Administered 2021-12-07: 130 mg
  Filled 2021-12-07: qty 1

## 2021-12-07 MED ORDER — IOFLUPANE I 123 185 MBQ/2.5ML IV SOLN
4.5000 | Freq: Once | INTRAVENOUS | Status: AC | PRN
  Administered 2021-12-07: 4.5 via INTRAVENOUS
  Filled 2021-12-07: qty 5

## 2021-12-08 LAB — CUP PACEART REMOTE DEVICE CHECK
Battery Remaining Longevity: 58 mo
Battery Remaining Percentage: 58 %
Battery Voltage: 2.99 V
Brady Statistic AP VP Percent: 13 %
Brady Statistic AP VS Percent: 1 %
Brady Statistic AS VP Percent: 87 %
Brady Statistic AS VS Percent: 1 %
Brady Statistic RA Percent Paced: 11 %
Brady Statistic RV Percent Paced: 99 %
Date Time Interrogation Session: 20230705020013
Implantable Lead Implant Date: 20190624
Implantable Lead Implant Date: 20190624
Implantable Lead Location: 753859
Implantable Lead Location: 753860
Implantable Lead Model: 5076
Implantable Lead Model: 5076
Implantable Pulse Generator Implant Date: 20190624
Lead Channel Impedance Value: 460 Ohm
Lead Channel Impedance Value: 480 Ohm
Lead Channel Pacing Threshold Amplitude: 0.75 V
Lead Channel Pacing Threshold Amplitude: 0.75 V
Lead Channel Pacing Threshold Pulse Width: 0.5 ms
Lead Channel Pacing Threshold Pulse Width: 0.5 ms
Lead Channel Sensing Intrinsic Amplitude: 12 mV
Lead Channel Sensing Intrinsic Amplitude: 2.2 mV
Lead Channel Setting Pacing Amplitude: 2 V
Lead Channel Setting Pacing Amplitude: 2.5 V
Lead Channel Setting Pacing Pulse Width: 0.5 ms
Lead Channel Setting Sensing Sensitivity: 2 mV
Pulse Gen Model: 2272
Pulse Gen Serial Number: 9033095

## 2021-12-09 DIAGNOSIS — I10 Essential (primary) hypertension: Secondary | ICD-10-CM | POA: Diagnosis not present

## 2021-12-09 DIAGNOSIS — I480 Paroxysmal atrial fibrillation: Secondary | ICD-10-CM | POA: Diagnosis not present

## 2021-12-09 DIAGNOSIS — G9341 Metabolic encephalopathy: Secondary | ICD-10-CM | POA: Diagnosis not present

## 2021-12-09 DIAGNOSIS — K220 Achalasia of cardia: Secondary | ICD-10-CM | POA: Diagnosis not present

## 2021-12-09 DIAGNOSIS — R2681 Unsteadiness on feet: Secondary | ICD-10-CM | POA: Diagnosis not present

## 2021-12-09 DIAGNOSIS — M6281 Muscle weakness (generalized): Secondary | ICD-10-CM | POA: Diagnosis not present

## 2021-12-09 DIAGNOSIS — R1312 Dysphagia, oropharyngeal phase: Secondary | ICD-10-CM | POA: Diagnosis not present

## 2021-12-09 DIAGNOSIS — G4733 Obstructive sleep apnea (adult) (pediatric): Secondary | ICD-10-CM | POA: Diagnosis not present

## 2021-12-09 DIAGNOSIS — I5022 Chronic systolic (congestive) heart failure: Secondary | ICD-10-CM | POA: Diagnosis not present

## 2021-12-09 DIAGNOSIS — E039 Hypothyroidism, unspecified: Secondary | ICD-10-CM | POA: Diagnosis not present

## 2021-12-09 DIAGNOSIS — R262 Difficulty in walking, not elsewhere classified: Secondary | ICD-10-CM | POA: Diagnosis not present

## 2021-12-09 DIAGNOSIS — N179 Acute kidney failure, unspecified: Secondary | ICD-10-CM | POA: Diagnosis not present

## 2021-12-09 DIAGNOSIS — M6259 Muscle wasting and atrophy, not elsewhere classified, multiple sites: Secondary | ICD-10-CM | POA: Diagnosis not present

## 2021-12-10 DIAGNOSIS — R2681 Unsteadiness on feet: Secondary | ICD-10-CM | POA: Diagnosis not present

## 2021-12-10 DIAGNOSIS — N179 Acute kidney failure, unspecified: Secondary | ICD-10-CM | POA: Diagnosis not present

## 2021-12-10 DIAGNOSIS — R262 Difficulty in walking, not elsewhere classified: Secondary | ICD-10-CM | POA: Diagnosis not present

## 2021-12-10 DIAGNOSIS — E039 Hypothyroidism, unspecified: Secondary | ICD-10-CM | POA: Diagnosis not present

## 2021-12-10 DIAGNOSIS — I480 Paroxysmal atrial fibrillation: Secondary | ICD-10-CM | POA: Diagnosis not present

## 2021-12-10 DIAGNOSIS — I5022 Chronic systolic (congestive) heart failure: Secondary | ICD-10-CM | POA: Diagnosis not present

## 2021-12-10 DIAGNOSIS — I10 Essential (primary) hypertension: Secondary | ICD-10-CM | POA: Diagnosis not present

## 2021-12-10 DIAGNOSIS — G4733 Obstructive sleep apnea (adult) (pediatric): Secondary | ICD-10-CM | POA: Diagnosis not present

## 2021-12-10 DIAGNOSIS — M6259 Muscle wasting and atrophy, not elsewhere classified, multiple sites: Secondary | ICD-10-CM | POA: Diagnosis not present

## 2021-12-10 DIAGNOSIS — M6281 Muscle weakness (generalized): Secondary | ICD-10-CM | POA: Diagnosis not present

## 2021-12-10 DIAGNOSIS — R1312 Dysphagia, oropharyngeal phase: Secondary | ICD-10-CM | POA: Diagnosis not present

## 2021-12-10 DIAGNOSIS — K220 Achalasia of cardia: Secondary | ICD-10-CM | POA: Diagnosis not present

## 2021-12-10 DIAGNOSIS — G9341 Metabolic encephalopathy: Secondary | ICD-10-CM | POA: Diagnosis not present

## 2021-12-11 DIAGNOSIS — G4733 Obstructive sleep apnea (adult) (pediatric): Secondary | ICD-10-CM | POA: Diagnosis not present

## 2021-12-11 DIAGNOSIS — E039 Hypothyroidism, unspecified: Secondary | ICD-10-CM | POA: Diagnosis not present

## 2021-12-11 DIAGNOSIS — R2681 Unsteadiness on feet: Secondary | ICD-10-CM | POA: Diagnosis not present

## 2021-12-11 DIAGNOSIS — R1312 Dysphagia, oropharyngeal phase: Secondary | ICD-10-CM | POA: Diagnosis not present

## 2021-12-11 DIAGNOSIS — K220 Achalasia of cardia: Secondary | ICD-10-CM | POA: Diagnosis not present

## 2021-12-11 DIAGNOSIS — M6259 Muscle wasting and atrophy, not elsewhere classified, multiple sites: Secondary | ICD-10-CM | POA: Diagnosis not present

## 2021-12-11 DIAGNOSIS — M6281 Muscle weakness (generalized): Secondary | ICD-10-CM | POA: Diagnosis not present

## 2021-12-11 DIAGNOSIS — I10 Essential (primary) hypertension: Secondary | ICD-10-CM | POA: Diagnosis not present

## 2021-12-11 DIAGNOSIS — I5022 Chronic systolic (congestive) heart failure: Secondary | ICD-10-CM | POA: Diagnosis not present

## 2021-12-11 DIAGNOSIS — R262 Difficulty in walking, not elsewhere classified: Secondary | ICD-10-CM | POA: Diagnosis not present

## 2021-12-11 DIAGNOSIS — I480 Paroxysmal atrial fibrillation: Secondary | ICD-10-CM | POA: Diagnosis not present

## 2021-12-11 DIAGNOSIS — N179 Acute kidney failure, unspecified: Secondary | ICD-10-CM | POA: Diagnosis not present

## 2021-12-11 DIAGNOSIS — G9341 Metabolic encephalopathy: Secondary | ICD-10-CM | POA: Diagnosis not present

## 2021-12-12 DIAGNOSIS — N179 Acute kidney failure, unspecified: Secondary | ICD-10-CM | POA: Diagnosis not present

## 2021-12-12 DIAGNOSIS — I10 Essential (primary) hypertension: Secondary | ICD-10-CM | POA: Diagnosis not present

## 2021-12-12 DIAGNOSIS — K220 Achalasia of cardia: Secondary | ICD-10-CM | POA: Diagnosis not present

## 2021-12-12 DIAGNOSIS — G9341 Metabolic encephalopathy: Secondary | ICD-10-CM | POA: Diagnosis not present

## 2021-12-12 DIAGNOSIS — R1312 Dysphagia, oropharyngeal phase: Secondary | ICD-10-CM | POA: Diagnosis not present

## 2021-12-12 DIAGNOSIS — M6259 Muscle wasting and atrophy, not elsewhere classified, multiple sites: Secondary | ICD-10-CM | POA: Diagnosis not present

## 2021-12-12 DIAGNOSIS — M6281 Muscle weakness (generalized): Secondary | ICD-10-CM | POA: Diagnosis not present

## 2021-12-12 DIAGNOSIS — R262 Difficulty in walking, not elsewhere classified: Secondary | ICD-10-CM | POA: Diagnosis not present

## 2021-12-12 DIAGNOSIS — R2681 Unsteadiness on feet: Secondary | ICD-10-CM | POA: Diagnosis not present

## 2021-12-12 DIAGNOSIS — E039 Hypothyroidism, unspecified: Secondary | ICD-10-CM | POA: Diagnosis not present

## 2021-12-12 DIAGNOSIS — I5022 Chronic systolic (congestive) heart failure: Secondary | ICD-10-CM | POA: Diagnosis not present

## 2021-12-12 DIAGNOSIS — G4733 Obstructive sleep apnea (adult) (pediatric): Secondary | ICD-10-CM | POA: Diagnosis not present

## 2021-12-12 DIAGNOSIS — I480 Paroxysmal atrial fibrillation: Secondary | ICD-10-CM | POA: Diagnosis not present

## 2021-12-13 ENCOUNTER — Telehealth: Payer: Self-pay

## 2021-12-13 DIAGNOSIS — K220 Achalasia of cardia: Secondary | ICD-10-CM | POA: Diagnosis not present

## 2021-12-13 DIAGNOSIS — M62838 Other muscle spasm: Secondary | ICD-10-CM | POA: Diagnosis not present

## 2021-12-13 DIAGNOSIS — R1312 Dysphagia, oropharyngeal phase: Secondary | ICD-10-CM | POA: Diagnosis not present

## 2021-12-13 DIAGNOSIS — G9341 Metabolic encephalopathy: Secondary | ICD-10-CM | POA: Diagnosis not present

## 2021-12-13 DIAGNOSIS — E039 Hypothyroidism, unspecified: Secondary | ICD-10-CM | POA: Diagnosis not present

## 2021-12-13 DIAGNOSIS — M79641 Pain in right hand: Secondary | ICD-10-CM | POA: Diagnosis not present

## 2021-12-13 DIAGNOSIS — M6281 Muscle weakness (generalized): Secondary | ICD-10-CM | POA: Diagnosis not present

## 2021-12-13 DIAGNOSIS — I5022 Chronic systolic (congestive) heart failure: Secondary | ICD-10-CM | POA: Diagnosis not present

## 2021-12-13 DIAGNOSIS — R2681 Unsteadiness on feet: Secondary | ICD-10-CM | POA: Diagnosis not present

## 2021-12-13 DIAGNOSIS — I10 Essential (primary) hypertension: Secondary | ICD-10-CM | POA: Diagnosis not present

## 2021-12-13 DIAGNOSIS — N179 Acute kidney failure, unspecified: Secondary | ICD-10-CM | POA: Diagnosis not present

## 2021-12-13 DIAGNOSIS — I480 Paroxysmal atrial fibrillation: Secondary | ICD-10-CM | POA: Diagnosis not present

## 2021-12-13 DIAGNOSIS — M6259 Muscle wasting and atrophy, not elsewhere classified, multiple sites: Secondary | ICD-10-CM | POA: Diagnosis not present

## 2021-12-13 DIAGNOSIS — R262 Difficulty in walking, not elsewhere classified: Secondary | ICD-10-CM | POA: Diagnosis not present

## 2021-12-13 DIAGNOSIS — G4733 Obstructive sleep apnea (adult) (pediatric): Secondary | ICD-10-CM | POA: Diagnosis not present

## 2021-12-13 NOTE — Telephone Encounter (Signed)
Called patients daughter and wife with Dat Scan results. Sent up front for scheduling a virtual appointment to go over results with Dr. Carles Collet

## 2021-12-14 ENCOUNTER — Encounter: Payer: Self-pay | Admitting: Neurology

## 2021-12-14 DIAGNOSIS — I5030 Unspecified diastolic (congestive) heart failure: Secondary | ICD-10-CM | POA: Diagnosis not present

## 2021-12-14 DIAGNOSIS — R2681 Unsteadiness on feet: Secondary | ICD-10-CM | POA: Diagnosis not present

## 2021-12-14 DIAGNOSIS — R262 Difficulty in walking, not elsewhere classified: Secondary | ICD-10-CM | POA: Diagnosis not present

## 2021-12-14 DIAGNOSIS — I5022 Chronic systolic (congestive) heart failure: Secondary | ICD-10-CM | POA: Diagnosis not present

## 2021-12-14 DIAGNOSIS — N183 Chronic kidney disease, stage 3 unspecified: Secondary | ICD-10-CM | POA: Diagnosis not present

## 2021-12-14 DIAGNOSIS — I10 Essential (primary) hypertension: Secondary | ICD-10-CM | POA: Diagnosis not present

## 2021-12-14 DIAGNOSIS — K220 Achalasia of cardia: Secondary | ICD-10-CM | POA: Diagnosis not present

## 2021-12-14 DIAGNOSIS — I480 Paroxysmal atrial fibrillation: Secondary | ICD-10-CM | POA: Diagnosis not present

## 2021-12-14 DIAGNOSIS — M6281 Muscle weakness (generalized): Secondary | ICD-10-CM | POA: Diagnosis not present

## 2021-12-14 DIAGNOSIS — I4891 Unspecified atrial fibrillation: Secondary | ICD-10-CM | POA: Diagnosis not present

## 2021-12-14 DIAGNOSIS — G4733 Obstructive sleep apnea (adult) (pediatric): Secondary | ICD-10-CM | POA: Diagnosis not present

## 2021-12-14 DIAGNOSIS — M6259 Muscle wasting and atrophy, not elsewhere classified, multiple sites: Secondary | ICD-10-CM | POA: Diagnosis not present

## 2021-12-14 DIAGNOSIS — G9341 Metabolic encephalopathy: Secondary | ICD-10-CM | POA: Diagnosis not present

## 2021-12-14 DIAGNOSIS — R1312 Dysphagia, oropharyngeal phase: Secondary | ICD-10-CM | POA: Diagnosis not present

## 2021-12-14 DIAGNOSIS — N179 Acute kidney failure, unspecified: Secondary | ICD-10-CM | POA: Diagnosis not present

## 2021-12-14 DIAGNOSIS — E039 Hypothyroidism, unspecified: Secondary | ICD-10-CM | POA: Diagnosis not present

## 2021-12-17 DIAGNOSIS — G4733 Obstructive sleep apnea (adult) (pediatric): Secondary | ICD-10-CM | POA: Diagnosis not present

## 2021-12-17 DIAGNOSIS — N179 Acute kidney failure, unspecified: Secondary | ICD-10-CM | POA: Diagnosis not present

## 2021-12-17 DIAGNOSIS — R262 Difficulty in walking, not elsewhere classified: Secondary | ICD-10-CM | POA: Diagnosis not present

## 2021-12-17 DIAGNOSIS — I5020 Unspecified systolic (congestive) heart failure: Secondary | ICD-10-CM | POA: Diagnosis not present

## 2021-12-17 DIAGNOSIS — I4891 Unspecified atrial fibrillation: Secondary | ICD-10-CM | POA: Diagnosis not present

## 2021-12-17 DIAGNOSIS — M6259 Muscle wasting and atrophy, not elsewhere classified, multiple sites: Secondary | ICD-10-CM | POA: Diagnosis not present

## 2021-12-17 DIAGNOSIS — G629 Polyneuropathy, unspecified: Secondary | ICD-10-CM | POA: Diagnosis not present

## 2021-12-17 DIAGNOSIS — K220 Achalasia of cardia: Secondary | ICD-10-CM | POA: Diagnosis not present

## 2021-12-17 DIAGNOSIS — M25531 Pain in right wrist: Secondary | ICD-10-CM | POA: Diagnosis not present

## 2021-12-17 DIAGNOSIS — R1312 Dysphagia, oropharyngeal phase: Secondary | ICD-10-CM | POA: Diagnosis not present

## 2021-12-17 DIAGNOSIS — R2681 Unsteadiness on feet: Secondary | ICD-10-CM | POA: Diagnosis not present

## 2021-12-17 DIAGNOSIS — I480 Paroxysmal atrial fibrillation: Secondary | ICD-10-CM | POA: Diagnosis not present

## 2021-12-17 DIAGNOSIS — I10 Essential (primary) hypertension: Secondary | ICD-10-CM | POA: Diagnosis not present

## 2021-12-17 DIAGNOSIS — M6281 Muscle weakness (generalized): Secondary | ICD-10-CM | POA: Diagnosis not present

## 2021-12-17 DIAGNOSIS — G9341 Metabolic encephalopathy: Secondary | ICD-10-CM | POA: Diagnosis not present

## 2021-12-17 DIAGNOSIS — K219 Gastro-esophageal reflux disease without esophagitis: Secondary | ICD-10-CM | POA: Diagnosis not present

## 2021-12-17 DIAGNOSIS — E039 Hypothyroidism, unspecified: Secondary | ICD-10-CM | POA: Diagnosis not present

## 2021-12-17 DIAGNOSIS — I5022 Chronic systolic (congestive) heart failure: Secondary | ICD-10-CM | POA: Diagnosis not present

## 2021-12-18 DIAGNOSIS — R2681 Unsteadiness on feet: Secondary | ICD-10-CM | POA: Diagnosis not present

## 2021-12-18 DIAGNOSIS — K220 Achalasia of cardia: Secondary | ICD-10-CM | POA: Diagnosis not present

## 2021-12-18 DIAGNOSIS — E039 Hypothyroidism, unspecified: Secondary | ICD-10-CM | POA: Diagnosis not present

## 2021-12-18 DIAGNOSIS — I10 Essential (primary) hypertension: Secondary | ICD-10-CM | POA: Diagnosis not present

## 2021-12-18 DIAGNOSIS — E119 Type 2 diabetes mellitus without complications: Secondary | ICD-10-CM | POA: Diagnosis not present

## 2021-12-18 DIAGNOSIS — R1312 Dysphagia, oropharyngeal phase: Secondary | ICD-10-CM | POA: Diagnosis not present

## 2021-12-18 DIAGNOSIS — I5022 Chronic systolic (congestive) heart failure: Secondary | ICD-10-CM | POA: Diagnosis not present

## 2021-12-18 DIAGNOSIS — N179 Acute kidney failure, unspecified: Secondary | ICD-10-CM | POA: Diagnosis not present

## 2021-12-18 DIAGNOSIS — G9341 Metabolic encephalopathy: Secondary | ICD-10-CM | POA: Diagnosis not present

## 2021-12-18 DIAGNOSIS — I480 Paroxysmal atrial fibrillation: Secondary | ICD-10-CM | POA: Diagnosis not present

## 2021-12-18 DIAGNOSIS — R262 Difficulty in walking, not elsewhere classified: Secondary | ICD-10-CM | POA: Diagnosis not present

## 2021-12-18 DIAGNOSIS — M6281 Muscle weakness (generalized): Secondary | ICD-10-CM | POA: Diagnosis not present

## 2021-12-18 DIAGNOSIS — M6259 Muscle wasting and atrophy, not elsewhere classified, multiple sites: Secondary | ICD-10-CM | POA: Diagnosis not present

## 2021-12-18 DIAGNOSIS — G4733 Obstructive sleep apnea (adult) (pediatric): Secondary | ICD-10-CM | POA: Diagnosis not present

## 2021-12-19 ENCOUNTER — Telehealth: Payer: Self-pay

## 2021-12-19 DIAGNOSIS — G629 Polyneuropathy, unspecified: Secondary | ICD-10-CM | POA: Diagnosis not present

## 2021-12-19 DIAGNOSIS — M25531 Pain in right wrist: Secondary | ICD-10-CM | POA: Diagnosis not present

## 2021-12-19 DIAGNOSIS — I4891 Unspecified atrial fibrillation: Secondary | ICD-10-CM | POA: Diagnosis not present

## 2021-12-19 DIAGNOSIS — R2681 Unsteadiness on feet: Secondary | ICD-10-CM | POA: Diagnosis not present

## 2021-12-19 DIAGNOSIS — M6259 Muscle wasting and atrophy, not elsewhere classified, multiple sites: Secondary | ICD-10-CM | POA: Diagnosis not present

## 2021-12-19 DIAGNOSIS — G4733 Obstructive sleep apnea (adult) (pediatric): Secondary | ICD-10-CM | POA: Diagnosis not present

## 2021-12-19 DIAGNOSIS — E039 Hypothyroidism, unspecified: Secondary | ICD-10-CM | POA: Diagnosis not present

## 2021-12-19 DIAGNOSIS — M6281 Muscle weakness (generalized): Secondary | ICD-10-CM | POA: Diagnosis not present

## 2021-12-19 DIAGNOSIS — G9341 Metabolic encephalopathy: Secondary | ICD-10-CM | POA: Diagnosis not present

## 2021-12-19 DIAGNOSIS — R1312 Dysphagia, oropharyngeal phase: Secondary | ICD-10-CM | POA: Diagnosis not present

## 2021-12-19 DIAGNOSIS — N179 Acute kidney failure, unspecified: Secondary | ICD-10-CM | POA: Diagnosis not present

## 2021-12-19 DIAGNOSIS — I10 Essential (primary) hypertension: Secondary | ICD-10-CM | POA: Diagnosis not present

## 2021-12-19 DIAGNOSIS — K220 Achalasia of cardia: Secondary | ICD-10-CM | POA: Diagnosis not present

## 2021-12-19 DIAGNOSIS — I5022 Chronic systolic (congestive) heart failure: Secondary | ICD-10-CM | POA: Diagnosis not present

## 2021-12-19 DIAGNOSIS — I480 Paroxysmal atrial fibrillation: Secondary | ICD-10-CM | POA: Diagnosis not present

## 2021-12-19 DIAGNOSIS — I5020 Unspecified systolic (congestive) heart failure: Secondary | ICD-10-CM | POA: Diagnosis not present

## 2021-12-19 DIAGNOSIS — R262 Difficulty in walking, not elsewhere classified: Secondary | ICD-10-CM | POA: Diagnosis not present

## 2021-12-19 NOTE — Telephone Encounter (Signed)
-----   Message from Simone Curia, RN sent at 12/11/2021  8:06 AM EDT ----- Regarding: Need transmission Could someone have patient send transmission then have nurse to review for Dr. Caryl Comes?  Thank you! Leigh ----- Message ----- From: Deboraha Sprang, MD Sent: 11/30/2021   4:24 PM EDT To: Simone Curia, RN; #  Remote reviewed. This remote is abnormal for persistent afib   he has spontaneously converted in the past   lets have him transmit next week maybe wed and see if has converted again ( 7/5)  he is on anticoagulation

## 2021-12-19 NOTE — Telephone Encounter (Signed)
Thanks SK   Pt on anticoagulation so were good for now

## 2021-12-19 NOTE — Telephone Encounter (Signed)
Transmission received 7/19, patient in normal sinus rhythm, patient had  4 days of AF

## 2021-12-19 NOTE — Telephone Encounter (Signed)
Transmission received 12/19/2021.

## 2021-12-20 DIAGNOSIS — R2681 Unsteadiness on feet: Secondary | ICD-10-CM | POA: Diagnosis not present

## 2021-12-20 DIAGNOSIS — E039 Hypothyroidism, unspecified: Secondary | ICD-10-CM | POA: Diagnosis not present

## 2021-12-20 DIAGNOSIS — K220 Achalasia of cardia: Secondary | ICD-10-CM | POA: Diagnosis not present

## 2021-12-20 DIAGNOSIS — M6259 Muscle wasting and atrophy, not elsewhere classified, multiple sites: Secondary | ICD-10-CM | POA: Diagnosis not present

## 2021-12-20 DIAGNOSIS — R262 Difficulty in walking, not elsewhere classified: Secondary | ICD-10-CM | POA: Diagnosis not present

## 2021-12-20 DIAGNOSIS — N179 Acute kidney failure, unspecified: Secondary | ICD-10-CM | POA: Diagnosis not present

## 2021-12-20 DIAGNOSIS — M6281 Muscle weakness (generalized): Secondary | ICD-10-CM | POA: Diagnosis not present

## 2021-12-20 DIAGNOSIS — G9341 Metabolic encephalopathy: Secondary | ICD-10-CM | POA: Diagnosis not present

## 2021-12-20 DIAGNOSIS — G4733 Obstructive sleep apnea (adult) (pediatric): Secondary | ICD-10-CM | POA: Diagnosis not present

## 2021-12-20 DIAGNOSIS — I480 Paroxysmal atrial fibrillation: Secondary | ICD-10-CM | POA: Diagnosis not present

## 2021-12-20 DIAGNOSIS — I10 Essential (primary) hypertension: Secondary | ICD-10-CM | POA: Diagnosis not present

## 2021-12-20 DIAGNOSIS — R1312 Dysphagia, oropharyngeal phase: Secondary | ICD-10-CM | POA: Diagnosis not present

## 2021-12-20 DIAGNOSIS — I5022 Chronic systolic (congestive) heart failure: Secondary | ICD-10-CM | POA: Diagnosis not present

## 2021-12-21 DIAGNOSIS — R1312 Dysphagia, oropharyngeal phase: Secondary | ICD-10-CM | POA: Diagnosis not present

## 2021-12-21 DIAGNOSIS — G4733 Obstructive sleep apnea (adult) (pediatric): Secondary | ICD-10-CM | POA: Diagnosis not present

## 2021-12-21 DIAGNOSIS — K220 Achalasia of cardia: Secondary | ICD-10-CM | POA: Diagnosis not present

## 2021-12-21 DIAGNOSIS — I10 Essential (primary) hypertension: Secondary | ICD-10-CM | POA: Diagnosis not present

## 2021-12-21 DIAGNOSIS — R2681 Unsteadiness on feet: Secondary | ICD-10-CM | POA: Diagnosis not present

## 2021-12-21 DIAGNOSIS — R262 Difficulty in walking, not elsewhere classified: Secondary | ICD-10-CM | POA: Diagnosis not present

## 2021-12-21 DIAGNOSIS — M6259 Muscle wasting and atrophy, not elsewhere classified, multiple sites: Secondary | ICD-10-CM | POA: Diagnosis not present

## 2021-12-21 DIAGNOSIS — N179 Acute kidney failure, unspecified: Secondary | ICD-10-CM | POA: Diagnosis not present

## 2021-12-21 DIAGNOSIS — I5022 Chronic systolic (congestive) heart failure: Secondary | ICD-10-CM | POA: Diagnosis not present

## 2021-12-21 DIAGNOSIS — E039 Hypothyroidism, unspecified: Secondary | ICD-10-CM | POA: Diagnosis not present

## 2021-12-21 DIAGNOSIS — M6281 Muscle weakness (generalized): Secondary | ICD-10-CM | POA: Diagnosis not present

## 2021-12-21 DIAGNOSIS — I480 Paroxysmal atrial fibrillation: Secondary | ICD-10-CM | POA: Diagnosis not present

## 2021-12-21 DIAGNOSIS — G9341 Metabolic encephalopathy: Secondary | ICD-10-CM | POA: Diagnosis not present

## 2021-12-24 DIAGNOSIS — I10 Essential (primary) hypertension: Secondary | ICD-10-CM | POA: Diagnosis not present

## 2021-12-24 DIAGNOSIS — I480 Paroxysmal atrial fibrillation: Secondary | ICD-10-CM | POA: Diagnosis not present

## 2021-12-24 DIAGNOSIS — G4733 Obstructive sleep apnea (adult) (pediatric): Secondary | ICD-10-CM | POA: Diagnosis not present

## 2021-12-24 DIAGNOSIS — M6281 Muscle weakness (generalized): Secondary | ICD-10-CM | POA: Diagnosis not present

## 2021-12-24 DIAGNOSIS — K220 Achalasia of cardia: Secondary | ICD-10-CM | POA: Diagnosis not present

## 2021-12-24 DIAGNOSIS — I5022 Chronic systolic (congestive) heart failure: Secondary | ICD-10-CM | POA: Diagnosis not present

## 2021-12-24 DIAGNOSIS — R262 Difficulty in walking, not elsewhere classified: Secondary | ICD-10-CM | POA: Diagnosis not present

## 2021-12-24 DIAGNOSIS — R2681 Unsteadiness on feet: Secondary | ICD-10-CM | POA: Diagnosis not present

## 2021-12-24 DIAGNOSIS — E039 Hypothyroidism, unspecified: Secondary | ICD-10-CM | POA: Diagnosis not present

## 2021-12-24 DIAGNOSIS — G9341 Metabolic encephalopathy: Secondary | ICD-10-CM | POA: Diagnosis not present

## 2021-12-24 DIAGNOSIS — R1312 Dysphagia, oropharyngeal phase: Secondary | ICD-10-CM | POA: Diagnosis not present

## 2021-12-24 DIAGNOSIS — M6259 Muscle wasting and atrophy, not elsewhere classified, multiple sites: Secondary | ICD-10-CM | POA: Diagnosis not present

## 2021-12-24 DIAGNOSIS — N179 Acute kidney failure, unspecified: Secondary | ICD-10-CM | POA: Diagnosis not present

## 2021-12-24 NOTE — Progress Notes (Signed)
Virtual Visit Via Video       Consent was obtained for video visit:  Yes.   Answered questions that patient had about telehealth interaction:  Yes.   I discussed the limitations, risks, security and privacy concerns of performing an evaluation and management service by telemedicine. I also discussed with the patient that there may be a patient responsible charge related to this service. The patient expressed understanding and agreed to proceed.  Pt location: Home Physician Location: office Name of referring provider:  Blair Heys, MD I connected with Franki Monte at patients initiation/request on 12/25/2021 at  2:00 PM EDT by video enabled telemedicine application and verified that I am speaking with the correct person using two identifiers. Pt MRN:  [id number] Pt DOB:  [birth year] Video Participants:  Franki Monte;  daughter, Selena Batten, supplements the history  Assessment/Plan:   1.  Parkinsonism with irreg tremor, as below  -PET scan was negative, making behavioral variant of FTD much less likely.  We discussed this again in detail, primarily because the daughter that was present today was not present at previous visits.  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  -Patient had DaTscan done December 07, 2021.  There was irregular shape in the striatal bilaterally.  It was felt that this could be because of patient head motion.  There was decreased relative uptake in the bilateral putamen, felt fairly symmetric.  -Discussed levodopa along with risks and benefits.  While I am not convinced that this led to his hospitalization last visit (he had a lot of things going on, including infection and discontinuation of his psychiatric meds), there is still a risk and we discussed this with patient and family.  They would like to trial the medicine.  I am just going to start it at a half a tablet 3 times per day for a month and then we will call them and make sure that he is doing okay before increasing the  medicine further.  This likely will not be an effective dose, however.  Patient and daughter both agreeable.  -Patient was told by orthopedics to get an antispasticity medication from [place].  Discussed with them that patient does not have spasticity, but rather rigidity, which is exactly what we are working on.  There was never question whether or not patient had rigidity in his right upper extremity, but discussed with them that that was not going to help the fingers, which was the reason that we wanted orthopedics involved.  However, orthopedics saw that his arm was rigid and felt that there was a bigger issue going on (and there is, but that is not the reason his fingers are the way they are).  -Patient's daughter asked about getting referral to a movement disorder specialist.  States that orthopedics told him to ask about this.  I discussed with them that this is what I am and they were satisfied with staying here.  -discussed skin biopsies.  May proceed in future, depending on levodopa response in future.    2.  Panic attacks and anxiety, longstanding for well over a decade.  -Patient following with psychiatry. -Appears that the nursing facility has cut back the patient's benzodiazepine use, such that he is only on clonazepam.  He was taking both alprazolam and clonazepam, both twice per day.  Glad to see that he is now on just the clonazepam.   3.  Achalasia             -Patient  has had Botox of the lower esophageal sphincter previously.  This is the source of swallow difficulties.   4.  History of GI bleed             -Follows with gastroenterology.  Subjective:   Kadeem Mckissic was seen today in follow up for parkinsonism.  Patient had DaTscan done December 07, 2021.  There was irregular shape in the striatal bilaterally.  It was felt that this could be because of patient head motion.  There was decreased relative uptake in the bilateral putamen, felt fairly symmetric.  Separately, the patient did go to  orthopedics since last visit for trigger fingers.  Notes are reviewed.  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  Stated that patient noted limited range of motion in the right hand.  Patient was told to get an appointment here for and "antispasticity medication."  Daughter notes today that he seems to walk well 1 day and then cannot walk when asked.  Current prescribed movement disorder medications: none   PREVIOUS MEDICATIONS: Sinemet  ALLERGIES:   Allergies  Allergen Reactions   Hydrochlorothiazide Other (See Comments)    "gout flares"   Lexapro [Escitalopram] Other (See Comments)    Ineffective for anxiety (10/2011)   Lisinopril Swelling and Other (See Comments)    Angioedema    Silodosin Diarrhea   Tamsulosin Other (See Comments)    Visual changes    CURRENT MEDICATIONS:  Current Meds  Medication Sig   acetaminophen (TYLENOL) 500 MG tablet Take 1,000 mg by mouth every 6 (six) hours as needed for headache or mild pain.   ALPRAZolam (XANAX) 0.5 MG tablet Take 1 tablet (0.5 mg total) by mouth 2 (two) times daily as needed for anxiety.   amLODipine (NORVASC) 5 MG tablet Take 1 tablet (5 mg total) by mouth daily.   bisacodyl (DULCOLAX) 10 MG suppository    busPIRone (BUSPAR) 5 MG tablet Take 5 mg by mouth 3 (three) times daily.   carbidopa-levodopa (SINEMET IR) 25-100 MG tablet 1/2 tablet at 7am/11am/4pm   clonazePAM (KLONOPIN) 0.5 MG disintegrating tablet Take 1 tablet (0.5 mg total) by mouth 2 (two) times daily.   diclofenac Sodium (VOLTAREN) 1 % GEL Apply 1 application  topically 4 (four) times daily.   diphenhydrAMINE-zinc acetate (BENADRYL) cream Apply topically 2 (two) times daily as needed for itching. (Patient taking differently: Apply 1 application  topically 2 (two) times daily as needed for itching.)   docusate sodium (COLACE) 100 MG capsule Take 100 mg by mouth 2 (two) times daily.   DULoxetine (CYMBALTA) 60 MG capsule    ELIQUIS 2.5 MG  TABS tablet Take 2.5 mg by mouth 2 (two) times daily.   guaiFENesin (MUCINEX) 600 MG 12 hr tablet Take 1 tablet (600 mg total) by mouth 2 (two) times daily.   hydrOXYzine (ATARAX) 25 MG tablet Take 25 mg by mouth every 8 (eight) hours as needed for itching.   ipratropium-albuterol (DUONEB) 0.5-2.5 (3) MG/3ML SOLN Take 3 mLs by nebulization.   levothyroxine (SYNTHROID) 137 MCG tablet Take 137 mcg by mouth daily before breakfast.   lidocaine (LIDODERM) 5 % Place 1 patch onto the skin daily. Apply to right shoulder and bilateral knees. Remove & Discard patch within 12 hours.   melatonin 5 MG TABS Take 5-10 mg by mouth at bedtime.   Multiple Vitamins-Minerals (DECUBI-VITE) CAPS Take 1 capsule by mouth daily.   Pollen Extracts (PROSTAT PO) Take 30 mLs by mouth in the  morning and at bedtime.   polyethylene glycol (MIRALAX / GLYCOLAX) 17 g packet Take 17 g by mouth 2 (two) times daily.   RESTASIS 0.05 % ophthalmic emulsion Place 1 drop into both eyes 2 (two) times daily as needed (dryness).   rosuvastatin (CRESTOR) 10 MG tablet Take 1 tablet (10 mg total) by mouth daily.   senna (SENOKOT) 8.6 MG TABS tablet Take 1 tablet (8.6 mg total) by mouth daily.   torsemide (DEMADEX) 20 MG tablet Take 1 tablet (20 mg total) by mouth as needed (swelling). (Patient taking differently: Take 20 mg by mouth daily as needed (swelling).)   traMADol (ULTRAM) 50 MG tablet Take 50 mg by mouth every 6 (six) hours as needed (pain).     Objective:   PHYSICAL EXAMINATION:    VITALS:   Vitals:   12/25/21 1341  Weight: 230 lb (104.3 kg)  Height: 6' (1.829 m)     GEN:  The patient appears stated age and is in NAD. HEENT:  Normocephalic, atraumatic.    Neurological examination:  Orientation: The patient is alert and oriented to person and place.  He is interactive with me. Cranial nerves: There is good facial symmetry with facial hypomimia. The speech is fluent and clear.   I have reviewed and interpreted the  following labs independently    Chemistry      Component Value Date/Time   NA 132 (L) 08/04/2021 0718   NA 134 05/25/2021 1412   K 3.7 08/04/2021 0718   CL 96 (L) 08/04/2021 0718   CO2 26 08/04/2021 0718   BUN 29 (H) 08/04/2021 0718   BUN 17 05/25/2021 1412   CREATININE 1.46 (H) 08/04/2021 0718      Component Value Date/Time   CALCIUM 8.9 08/04/2021 0718   ALKPHOS 84 07/29/2021 1221   AST 20 07/29/2021 1221   ALT 14 07/29/2021 1221   BILITOT 1.6 (H) 07/29/2021 1221   BILITOT 1.3 (H) 05/25/2021 1412       Lab Results  Component Value Date   WBC 8.5 08/04/2021   HGB 11.0 (L) 08/04/2021   HCT 33.6 (L) 08/04/2021   MCV 89.6 08/04/2021   PLT 193 08/04/2021    Lab Results  Component Value Date   TSH 1.181 07/03/2021    Follow up Instructions      -I discussed the assessment and treatment plan with the patient. The patient was provided an opportunity to ask questions and all were answered. The patient agreed with the plan and demonstrated an understanding of the instructions.   The patient was advised to call back or seek an in-person evaluation if the symptoms worsen or if the condition fails to improve as anticipated.      Kerin Salen, DO   Cc:  Blair Heys, MD

## 2021-12-25 ENCOUNTER — Encounter: Payer: Self-pay | Admitting: Neurology

## 2021-12-25 ENCOUNTER — Telehealth (INDEPENDENT_AMBULATORY_CARE_PROVIDER_SITE_OTHER): Payer: Medicare Other | Admitting: Neurology

## 2021-12-25 VITALS — Ht 72.0 in | Wt 230.0 lb

## 2021-12-25 DIAGNOSIS — G4733 Obstructive sleep apnea (adult) (pediatric): Secondary | ICD-10-CM | POA: Diagnosis not present

## 2021-12-25 DIAGNOSIS — R262 Difficulty in walking, not elsewhere classified: Secondary | ICD-10-CM | POA: Diagnosis not present

## 2021-12-25 DIAGNOSIS — G20 Parkinson's disease: Secondary | ICD-10-CM | POA: Diagnosis not present

## 2021-12-25 DIAGNOSIS — M6281 Muscle weakness (generalized): Secondary | ICD-10-CM | POA: Diagnosis not present

## 2021-12-25 DIAGNOSIS — R1312 Dysphagia, oropharyngeal phase: Secondary | ICD-10-CM | POA: Diagnosis not present

## 2021-12-25 DIAGNOSIS — E039 Hypothyroidism, unspecified: Secondary | ICD-10-CM | POA: Diagnosis not present

## 2021-12-25 DIAGNOSIS — N179 Acute kidney failure, unspecified: Secondary | ICD-10-CM | POA: Diagnosis not present

## 2021-12-25 DIAGNOSIS — G9341 Metabolic encephalopathy: Secondary | ICD-10-CM | POA: Diagnosis not present

## 2021-12-25 DIAGNOSIS — M6259 Muscle wasting and atrophy, not elsewhere classified, multiple sites: Secondary | ICD-10-CM | POA: Diagnosis not present

## 2021-12-25 DIAGNOSIS — K220 Achalasia of cardia: Secondary | ICD-10-CM | POA: Diagnosis not present

## 2021-12-25 DIAGNOSIS — R2681 Unsteadiness on feet: Secondary | ICD-10-CM | POA: Diagnosis not present

## 2021-12-25 DIAGNOSIS — I480 Paroxysmal atrial fibrillation: Secondary | ICD-10-CM | POA: Diagnosis not present

## 2021-12-25 DIAGNOSIS — I10 Essential (primary) hypertension: Secondary | ICD-10-CM | POA: Diagnosis not present

## 2021-12-25 DIAGNOSIS — I5022 Chronic systolic (congestive) heart failure: Secondary | ICD-10-CM | POA: Diagnosis not present

## 2021-12-25 MED ORDER — CARBIDOPA-LEVODOPA 25-100 MG PO TABS: ORAL_TABLET | ORAL | 1 refills | Status: DC

## 2021-12-26 DIAGNOSIS — G9341 Metabolic encephalopathy: Secondary | ICD-10-CM | POA: Diagnosis not present

## 2021-12-26 DIAGNOSIS — G20 Parkinson's disease: Secondary | ICD-10-CM | POA: Diagnosis not present

## 2021-12-26 DIAGNOSIS — K220 Achalasia of cardia: Secondary | ICD-10-CM | POA: Diagnosis not present

## 2021-12-26 DIAGNOSIS — I4891 Unspecified atrial fibrillation: Secondary | ICD-10-CM | POA: Diagnosis not present

## 2021-12-26 DIAGNOSIS — I480 Paroxysmal atrial fibrillation: Secondary | ICD-10-CM | POA: Diagnosis not present

## 2021-12-26 DIAGNOSIS — R1312 Dysphagia, oropharyngeal phase: Secondary | ICD-10-CM | POA: Diagnosis not present

## 2021-12-26 DIAGNOSIS — G4733 Obstructive sleep apnea (adult) (pediatric): Secondary | ICD-10-CM | POA: Diagnosis not present

## 2021-12-26 DIAGNOSIS — M1990 Unspecified osteoarthritis, unspecified site: Secondary | ICD-10-CM | POA: Diagnosis not present

## 2021-12-26 DIAGNOSIS — N179 Acute kidney failure, unspecified: Secondary | ICD-10-CM | POA: Diagnosis not present

## 2021-12-26 DIAGNOSIS — I5020 Unspecified systolic (congestive) heart failure: Secondary | ICD-10-CM | POA: Diagnosis not present

## 2021-12-26 DIAGNOSIS — I5022 Chronic systolic (congestive) heart failure: Secondary | ICD-10-CM | POA: Diagnosis not present

## 2021-12-26 DIAGNOSIS — R2681 Unsteadiness on feet: Secondary | ICD-10-CM | POA: Diagnosis not present

## 2021-12-26 DIAGNOSIS — M6259 Muscle wasting and atrophy, not elsewhere classified, multiple sites: Secondary | ICD-10-CM | POA: Diagnosis not present

## 2021-12-26 DIAGNOSIS — R262 Difficulty in walking, not elsewhere classified: Secondary | ICD-10-CM | POA: Diagnosis not present

## 2021-12-26 DIAGNOSIS — E039 Hypothyroidism, unspecified: Secondary | ICD-10-CM | POA: Diagnosis not present

## 2021-12-26 DIAGNOSIS — I10 Essential (primary) hypertension: Secondary | ICD-10-CM | POA: Diagnosis not present

## 2021-12-26 DIAGNOSIS — M6281 Muscle weakness (generalized): Secondary | ICD-10-CM | POA: Diagnosis not present

## 2021-12-26 NOTE — Progress Notes (Signed)
Remote pacemaker transmission.   

## 2021-12-27 DIAGNOSIS — M6259 Muscle wasting and atrophy, not elsewhere classified, multiple sites: Secondary | ICD-10-CM | POA: Diagnosis not present

## 2021-12-27 DIAGNOSIS — I10 Essential (primary) hypertension: Secondary | ICD-10-CM | POA: Diagnosis not present

## 2021-12-27 DIAGNOSIS — M6281 Muscle weakness (generalized): Secondary | ICD-10-CM | POA: Diagnosis not present

## 2021-12-27 DIAGNOSIS — N179 Acute kidney failure, unspecified: Secondary | ICD-10-CM | POA: Diagnosis not present

## 2021-12-27 DIAGNOSIS — G9341 Metabolic encephalopathy: Secondary | ICD-10-CM | POA: Diagnosis not present

## 2021-12-27 DIAGNOSIS — I5022 Chronic systolic (congestive) heart failure: Secondary | ICD-10-CM | POA: Diagnosis not present

## 2021-12-27 DIAGNOSIS — R262 Difficulty in walking, not elsewhere classified: Secondary | ICD-10-CM | POA: Diagnosis not present

## 2021-12-27 DIAGNOSIS — E039 Hypothyroidism, unspecified: Secondary | ICD-10-CM | POA: Diagnosis not present

## 2021-12-27 DIAGNOSIS — I480 Paroxysmal atrial fibrillation: Secondary | ICD-10-CM | POA: Diagnosis not present

## 2021-12-27 DIAGNOSIS — R1312 Dysphagia, oropharyngeal phase: Secondary | ICD-10-CM | POA: Diagnosis not present

## 2021-12-27 DIAGNOSIS — G4733 Obstructive sleep apnea (adult) (pediatric): Secondary | ICD-10-CM | POA: Diagnosis not present

## 2021-12-27 DIAGNOSIS — K220 Achalasia of cardia: Secondary | ICD-10-CM | POA: Diagnosis not present

## 2021-12-27 DIAGNOSIS — R2681 Unsteadiness on feet: Secondary | ICD-10-CM | POA: Diagnosis not present

## 2021-12-28 DIAGNOSIS — I480 Paroxysmal atrial fibrillation: Secondary | ICD-10-CM | POA: Diagnosis not present

## 2021-12-28 DIAGNOSIS — I10 Essential (primary) hypertension: Secondary | ICD-10-CM | POA: Diagnosis not present

## 2021-12-28 DIAGNOSIS — R1312 Dysphagia, oropharyngeal phase: Secondary | ICD-10-CM | POA: Diagnosis not present

## 2021-12-28 DIAGNOSIS — K220 Achalasia of cardia: Secondary | ICD-10-CM | POA: Diagnosis not present

## 2021-12-28 DIAGNOSIS — R262 Difficulty in walking, not elsewhere classified: Secondary | ICD-10-CM | POA: Diagnosis not present

## 2021-12-28 DIAGNOSIS — N179 Acute kidney failure, unspecified: Secondary | ICD-10-CM | POA: Diagnosis not present

## 2021-12-28 DIAGNOSIS — G4733 Obstructive sleep apnea (adult) (pediatric): Secondary | ICD-10-CM | POA: Diagnosis not present

## 2021-12-28 DIAGNOSIS — E039 Hypothyroidism, unspecified: Secondary | ICD-10-CM | POA: Diagnosis not present

## 2021-12-28 DIAGNOSIS — M6281 Muscle weakness (generalized): Secondary | ICD-10-CM | POA: Diagnosis not present

## 2021-12-28 DIAGNOSIS — G9341 Metabolic encephalopathy: Secondary | ICD-10-CM | POA: Diagnosis not present

## 2021-12-28 DIAGNOSIS — R2681 Unsteadiness on feet: Secondary | ICD-10-CM | POA: Diagnosis not present

## 2021-12-28 DIAGNOSIS — M6259 Muscle wasting and atrophy, not elsewhere classified, multiple sites: Secondary | ICD-10-CM | POA: Diagnosis not present

## 2021-12-28 DIAGNOSIS — I5022 Chronic systolic (congestive) heart failure: Secondary | ICD-10-CM | POA: Diagnosis not present

## 2021-12-29 DIAGNOSIS — I10 Essential (primary) hypertension: Secondary | ICD-10-CM | POA: Diagnosis not present

## 2021-12-29 DIAGNOSIS — M6281 Muscle weakness (generalized): Secondary | ICD-10-CM | POA: Diagnosis not present

## 2021-12-29 DIAGNOSIS — I5020 Unspecified systolic (congestive) heart failure: Secondary | ICD-10-CM | POA: Diagnosis not present

## 2021-12-29 DIAGNOSIS — G20 Parkinson's disease: Secondary | ICD-10-CM | POA: Diagnosis not present

## 2021-12-29 DIAGNOSIS — G629 Polyneuropathy, unspecified: Secondary | ICD-10-CM | POA: Diagnosis not present

## 2021-12-29 DIAGNOSIS — I4891 Unspecified atrial fibrillation: Secondary | ICD-10-CM | POA: Diagnosis not present

## 2021-12-29 DIAGNOSIS — K219 Gastro-esophageal reflux disease without esophagitis: Secondary | ICD-10-CM | POA: Diagnosis not present

## 2021-12-30 DIAGNOSIS — R2681 Unsteadiness on feet: Secondary | ICD-10-CM | POA: Diagnosis not present

## 2021-12-30 DIAGNOSIS — M6281 Muscle weakness (generalized): Secondary | ICD-10-CM | POA: Diagnosis not present

## 2021-12-30 DIAGNOSIS — R262 Difficulty in walking, not elsewhere classified: Secondary | ICD-10-CM | POA: Diagnosis not present

## 2021-12-30 DIAGNOSIS — N179 Acute kidney failure, unspecified: Secondary | ICD-10-CM | POA: Diagnosis not present

## 2021-12-30 DIAGNOSIS — G9341 Metabolic encephalopathy: Secondary | ICD-10-CM | POA: Diagnosis not present

## 2021-12-30 DIAGNOSIS — I5022 Chronic systolic (congestive) heart failure: Secondary | ICD-10-CM | POA: Diagnosis not present

## 2021-12-30 DIAGNOSIS — M6259 Muscle wasting and atrophy, not elsewhere classified, multiple sites: Secondary | ICD-10-CM | POA: Diagnosis not present

## 2021-12-30 DIAGNOSIS — E039 Hypothyroidism, unspecified: Secondary | ICD-10-CM | POA: Diagnosis not present

## 2021-12-30 DIAGNOSIS — G4733 Obstructive sleep apnea (adult) (pediatric): Secondary | ICD-10-CM | POA: Diagnosis not present

## 2021-12-30 DIAGNOSIS — K220 Achalasia of cardia: Secondary | ICD-10-CM | POA: Diagnosis not present

## 2021-12-30 DIAGNOSIS — I480 Paroxysmal atrial fibrillation: Secondary | ICD-10-CM | POA: Diagnosis not present

## 2021-12-30 DIAGNOSIS — I10 Essential (primary) hypertension: Secondary | ICD-10-CM | POA: Diagnosis not present

## 2021-12-30 DIAGNOSIS — R1312 Dysphagia, oropharyngeal phase: Secondary | ICD-10-CM | POA: Diagnosis not present

## 2021-12-31 DIAGNOSIS — R2681 Unsteadiness on feet: Secondary | ICD-10-CM | POA: Diagnosis not present

## 2021-12-31 DIAGNOSIS — I5022 Chronic systolic (congestive) heart failure: Secondary | ICD-10-CM | POA: Diagnosis not present

## 2021-12-31 DIAGNOSIS — K220 Achalasia of cardia: Secondary | ICD-10-CM | POA: Diagnosis not present

## 2021-12-31 DIAGNOSIS — M6281 Muscle weakness (generalized): Secondary | ICD-10-CM | POA: Diagnosis not present

## 2021-12-31 DIAGNOSIS — G9341 Metabolic encephalopathy: Secondary | ICD-10-CM | POA: Diagnosis not present

## 2021-12-31 DIAGNOSIS — R1312 Dysphagia, oropharyngeal phase: Secondary | ICD-10-CM | POA: Diagnosis not present

## 2021-12-31 DIAGNOSIS — E039 Hypothyroidism, unspecified: Secondary | ICD-10-CM | POA: Diagnosis not present

## 2021-12-31 DIAGNOSIS — R262 Difficulty in walking, not elsewhere classified: Secondary | ICD-10-CM | POA: Diagnosis not present

## 2021-12-31 DIAGNOSIS — M6259 Muscle wasting and atrophy, not elsewhere classified, multiple sites: Secondary | ICD-10-CM | POA: Diagnosis not present

## 2021-12-31 DIAGNOSIS — I480 Paroxysmal atrial fibrillation: Secondary | ICD-10-CM | POA: Diagnosis not present

## 2021-12-31 DIAGNOSIS — N179 Acute kidney failure, unspecified: Secondary | ICD-10-CM | POA: Diagnosis not present

## 2021-12-31 DIAGNOSIS — G4733 Obstructive sleep apnea (adult) (pediatric): Secondary | ICD-10-CM | POA: Diagnosis not present

## 2021-12-31 DIAGNOSIS — I10 Essential (primary) hypertension: Secondary | ICD-10-CM | POA: Diagnosis not present

## 2022-01-01 DIAGNOSIS — N179 Acute kidney failure, unspecified: Secondary | ICD-10-CM | POA: Diagnosis not present

## 2022-01-01 DIAGNOSIS — R1311 Dysphagia, oral phase: Secondary | ICD-10-CM | POA: Diagnosis not present

## 2022-01-01 DIAGNOSIS — F03A0 Unspecified dementia, mild, without behavioral disturbance, psychotic disturbance, mood disturbance, and anxiety: Secondary | ICD-10-CM | POA: Diagnosis not present

## 2022-01-01 DIAGNOSIS — R1312 Dysphagia, oropharyngeal phase: Secondary | ICD-10-CM | POA: Diagnosis not present

## 2022-01-01 DIAGNOSIS — R2681 Unsteadiness on feet: Secondary | ICD-10-CM | POA: Diagnosis not present

## 2022-01-01 DIAGNOSIS — I5022 Chronic systolic (congestive) heart failure: Secondary | ICD-10-CM | POA: Diagnosis not present

## 2022-01-01 DIAGNOSIS — R278 Other lack of coordination: Secondary | ICD-10-CM | POA: Diagnosis not present

## 2022-01-01 DIAGNOSIS — G20 Parkinson's disease: Secondary | ICD-10-CM | POA: Diagnosis not present

## 2022-01-01 DIAGNOSIS — G4733 Obstructive sleep apnea (adult) (pediatric): Secondary | ICD-10-CM | POA: Diagnosis not present

## 2022-01-01 DIAGNOSIS — N139 Obstructive and reflux uropathy, unspecified: Secondary | ICD-10-CM | POA: Diagnosis not present

## 2022-01-01 DIAGNOSIS — R293 Abnormal posture: Secondary | ICD-10-CM | POA: Diagnosis not present

## 2022-01-01 DIAGNOSIS — I10 Essential (primary) hypertension: Secondary | ICD-10-CM | POA: Diagnosis not present

## 2022-01-01 DIAGNOSIS — E039 Hypothyroidism, unspecified: Secondary | ICD-10-CM | POA: Diagnosis not present

## 2022-01-01 DIAGNOSIS — M6259 Muscle wasting and atrophy, not elsewhere classified, multiple sites: Secondary | ICD-10-CM | POA: Diagnosis not present

## 2022-01-01 DIAGNOSIS — I480 Paroxysmal atrial fibrillation: Secondary | ICD-10-CM | POA: Diagnosis not present

## 2022-01-01 DIAGNOSIS — M6281 Muscle weakness (generalized): Secondary | ICD-10-CM | POA: Diagnosis not present

## 2022-01-02 DIAGNOSIS — I5022 Chronic systolic (congestive) heart failure: Secondary | ICD-10-CM | POA: Diagnosis not present

## 2022-01-02 DIAGNOSIS — N179 Acute kidney failure, unspecified: Secondary | ICD-10-CM | POA: Diagnosis not present

## 2022-01-02 DIAGNOSIS — R293 Abnormal posture: Secondary | ICD-10-CM | POA: Diagnosis not present

## 2022-01-02 DIAGNOSIS — R2681 Unsteadiness on feet: Secondary | ICD-10-CM | POA: Diagnosis not present

## 2022-01-02 DIAGNOSIS — M6281 Muscle weakness (generalized): Secondary | ICD-10-CM | POA: Diagnosis not present

## 2022-01-02 DIAGNOSIS — G4733 Obstructive sleep apnea (adult) (pediatric): Secondary | ICD-10-CM | POA: Diagnosis not present

## 2022-01-02 DIAGNOSIS — R1312 Dysphagia, oropharyngeal phase: Secondary | ICD-10-CM | POA: Diagnosis not present

## 2022-01-02 DIAGNOSIS — I10 Essential (primary) hypertension: Secondary | ICD-10-CM | POA: Diagnosis not present

## 2022-01-02 DIAGNOSIS — N139 Obstructive and reflux uropathy, unspecified: Secondary | ICD-10-CM | POA: Diagnosis not present

## 2022-01-02 DIAGNOSIS — I480 Paroxysmal atrial fibrillation: Secondary | ICD-10-CM | POA: Diagnosis not present

## 2022-01-02 DIAGNOSIS — R1311 Dysphagia, oral phase: Secondary | ICD-10-CM | POA: Diagnosis not present

## 2022-01-02 DIAGNOSIS — R278 Other lack of coordination: Secondary | ICD-10-CM | POA: Diagnosis not present

## 2022-01-02 DIAGNOSIS — F03A0 Unspecified dementia, mild, without behavioral disturbance, psychotic disturbance, mood disturbance, and anxiety: Secondary | ICD-10-CM | POA: Diagnosis not present

## 2022-01-02 DIAGNOSIS — E039 Hypothyroidism, unspecified: Secondary | ICD-10-CM | POA: Diagnosis not present

## 2022-01-02 DIAGNOSIS — G20 Parkinson's disease: Secondary | ICD-10-CM | POA: Diagnosis not present

## 2022-01-02 DIAGNOSIS — M6259 Muscle wasting and atrophy, not elsewhere classified, multiple sites: Secondary | ICD-10-CM | POA: Diagnosis not present

## 2022-01-03 DIAGNOSIS — I5022 Chronic systolic (congestive) heart failure: Secondary | ICD-10-CM | POA: Diagnosis not present

## 2022-01-03 DIAGNOSIS — R1311 Dysphagia, oral phase: Secondary | ICD-10-CM | POA: Diagnosis not present

## 2022-01-03 DIAGNOSIS — N139 Obstructive and reflux uropathy, unspecified: Secondary | ICD-10-CM | POA: Diagnosis not present

## 2022-01-03 DIAGNOSIS — R278 Other lack of coordination: Secondary | ICD-10-CM | POA: Diagnosis not present

## 2022-01-03 DIAGNOSIS — N179 Acute kidney failure, unspecified: Secondary | ICD-10-CM | POA: Diagnosis not present

## 2022-01-03 DIAGNOSIS — R293 Abnormal posture: Secondary | ICD-10-CM | POA: Diagnosis not present

## 2022-01-03 DIAGNOSIS — R1312 Dysphagia, oropharyngeal phase: Secondary | ICD-10-CM | POA: Diagnosis not present

## 2022-01-03 DIAGNOSIS — M6281 Muscle weakness (generalized): Secondary | ICD-10-CM | POA: Diagnosis not present

## 2022-01-03 DIAGNOSIS — G4733 Obstructive sleep apnea (adult) (pediatric): Secondary | ICD-10-CM | POA: Diagnosis not present

## 2022-01-03 DIAGNOSIS — M6259 Muscle wasting and atrophy, not elsewhere classified, multiple sites: Secondary | ICD-10-CM | POA: Diagnosis not present

## 2022-01-03 DIAGNOSIS — F03A0 Unspecified dementia, mild, without behavioral disturbance, psychotic disturbance, mood disturbance, and anxiety: Secondary | ICD-10-CM | POA: Diagnosis not present

## 2022-01-03 DIAGNOSIS — I10 Essential (primary) hypertension: Secondary | ICD-10-CM | POA: Diagnosis not present

## 2022-01-03 DIAGNOSIS — R2681 Unsteadiness on feet: Secondary | ICD-10-CM | POA: Diagnosis not present

## 2022-01-03 DIAGNOSIS — G20 Parkinson's disease: Secondary | ICD-10-CM | POA: Diagnosis not present

## 2022-01-03 DIAGNOSIS — E039 Hypothyroidism, unspecified: Secondary | ICD-10-CM | POA: Diagnosis not present

## 2022-01-03 DIAGNOSIS — I480 Paroxysmal atrial fibrillation: Secondary | ICD-10-CM | POA: Diagnosis not present

## 2022-01-04 DIAGNOSIS — G4733 Obstructive sleep apnea (adult) (pediatric): Secondary | ICD-10-CM | POA: Diagnosis not present

## 2022-01-04 DIAGNOSIS — R1312 Dysphagia, oropharyngeal phase: Secondary | ICD-10-CM | POA: Diagnosis not present

## 2022-01-04 DIAGNOSIS — I480 Paroxysmal atrial fibrillation: Secondary | ICD-10-CM | POA: Diagnosis not present

## 2022-01-04 DIAGNOSIS — R278 Other lack of coordination: Secondary | ICD-10-CM | POA: Diagnosis not present

## 2022-01-04 DIAGNOSIS — R1311 Dysphagia, oral phase: Secondary | ICD-10-CM | POA: Diagnosis not present

## 2022-01-04 DIAGNOSIS — M6259 Muscle wasting and atrophy, not elsewhere classified, multiple sites: Secondary | ICD-10-CM | POA: Diagnosis not present

## 2022-01-04 DIAGNOSIS — I5020 Unspecified systolic (congestive) heart failure: Secondary | ICD-10-CM | POA: Diagnosis not present

## 2022-01-04 DIAGNOSIS — M6281 Muscle weakness (generalized): Secondary | ICD-10-CM | POA: Diagnosis not present

## 2022-01-04 DIAGNOSIS — N179 Acute kidney failure, unspecified: Secondary | ICD-10-CM | POA: Diagnosis not present

## 2022-01-04 DIAGNOSIS — F03A0 Unspecified dementia, mild, without behavioral disturbance, psychotic disturbance, mood disturbance, and anxiety: Secondary | ICD-10-CM | POA: Diagnosis not present

## 2022-01-04 DIAGNOSIS — R2681 Unsteadiness on feet: Secondary | ICD-10-CM | POA: Diagnosis not present

## 2022-01-04 DIAGNOSIS — G20 Parkinson's disease: Secondary | ICD-10-CM | POA: Diagnosis not present

## 2022-01-04 DIAGNOSIS — R293 Abnormal posture: Secondary | ICD-10-CM | POA: Diagnosis not present

## 2022-01-04 DIAGNOSIS — I10 Essential (primary) hypertension: Secondary | ICD-10-CM | POA: Diagnosis not present

## 2022-01-04 DIAGNOSIS — I5022 Chronic systolic (congestive) heart failure: Secondary | ICD-10-CM | POA: Diagnosis not present

## 2022-01-04 DIAGNOSIS — I4891 Unspecified atrial fibrillation: Secondary | ICD-10-CM | POA: Diagnosis not present

## 2022-01-04 DIAGNOSIS — N139 Obstructive and reflux uropathy, unspecified: Secondary | ICD-10-CM | POA: Diagnosis not present

## 2022-01-04 DIAGNOSIS — E039 Hypothyroidism, unspecified: Secondary | ICD-10-CM | POA: Diagnosis not present

## 2022-01-06 DIAGNOSIS — E039 Hypothyroidism, unspecified: Secondary | ICD-10-CM | POA: Diagnosis not present

## 2022-01-06 DIAGNOSIS — N139 Obstructive and reflux uropathy, unspecified: Secondary | ICD-10-CM | POA: Diagnosis not present

## 2022-01-06 DIAGNOSIS — I10 Essential (primary) hypertension: Secondary | ICD-10-CM | POA: Diagnosis not present

## 2022-01-06 DIAGNOSIS — G4733 Obstructive sleep apnea (adult) (pediatric): Secondary | ICD-10-CM | POA: Diagnosis not present

## 2022-01-06 DIAGNOSIS — R2681 Unsteadiness on feet: Secondary | ICD-10-CM | POA: Diagnosis not present

## 2022-01-06 DIAGNOSIS — R1311 Dysphagia, oral phase: Secondary | ICD-10-CM | POA: Diagnosis not present

## 2022-01-06 DIAGNOSIS — R278 Other lack of coordination: Secondary | ICD-10-CM | POA: Diagnosis not present

## 2022-01-06 DIAGNOSIS — F03A0 Unspecified dementia, mild, without behavioral disturbance, psychotic disturbance, mood disturbance, and anxiety: Secondary | ICD-10-CM | POA: Diagnosis not present

## 2022-01-06 DIAGNOSIS — N179 Acute kidney failure, unspecified: Secondary | ICD-10-CM | POA: Diagnosis not present

## 2022-01-06 DIAGNOSIS — I480 Paroxysmal atrial fibrillation: Secondary | ICD-10-CM | POA: Diagnosis not present

## 2022-01-06 DIAGNOSIS — M6259 Muscle wasting and atrophy, not elsewhere classified, multiple sites: Secondary | ICD-10-CM | POA: Diagnosis not present

## 2022-01-06 DIAGNOSIS — G20 Parkinson's disease: Secondary | ICD-10-CM | POA: Diagnosis not present

## 2022-01-06 DIAGNOSIS — M6281 Muscle weakness (generalized): Secondary | ICD-10-CM | POA: Diagnosis not present

## 2022-01-06 DIAGNOSIS — R1312 Dysphagia, oropharyngeal phase: Secondary | ICD-10-CM | POA: Diagnosis not present

## 2022-01-06 DIAGNOSIS — I5022 Chronic systolic (congestive) heart failure: Secondary | ICD-10-CM | POA: Diagnosis not present

## 2022-01-06 DIAGNOSIS — R293 Abnormal posture: Secondary | ICD-10-CM | POA: Diagnosis not present

## 2022-01-07 DIAGNOSIS — G4733 Obstructive sleep apnea (adult) (pediatric): Secondary | ICD-10-CM | POA: Diagnosis not present

## 2022-01-07 DIAGNOSIS — R293 Abnormal posture: Secondary | ICD-10-CM | POA: Diagnosis not present

## 2022-01-07 DIAGNOSIS — N139 Obstructive and reflux uropathy, unspecified: Secondary | ICD-10-CM | POA: Diagnosis not present

## 2022-01-07 DIAGNOSIS — R1312 Dysphagia, oropharyngeal phase: Secondary | ICD-10-CM | POA: Diagnosis not present

## 2022-01-07 DIAGNOSIS — I480 Paroxysmal atrial fibrillation: Secondary | ICD-10-CM | POA: Diagnosis not present

## 2022-01-07 DIAGNOSIS — M6259 Muscle wasting and atrophy, not elsewhere classified, multiple sites: Secondary | ICD-10-CM | POA: Diagnosis not present

## 2022-01-07 DIAGNOSIS — N179 Acute kidney failure, unspecified: Secondary | ICD-10-CM | POA: Diagnosis not present

## 2022-01-07 DIAGNOSIS — I10 Essential (primary) hypertension: Secondary | ICD-10-CM | POA: Diagnosis not present

## 2022-01-07 DIAGNOSIS — M6281 Muscle weakness (generalized): Secondary | ICD-10-CM | POA: Diagnosis not present

## 2022-01-07 DIAGNOSIS — R1311 Dysphagia, oral phase: Secondary | ICD-10-CM | POA: Diagnosis not present

## 2022-01-07 DIAGNOSIS — G20 Parkinson's disease: Secondary | ICD-10-CM | POA: Diagnosis not present

## 2022-01-07 DIAGNOSIS — I5022 Chronic systolic (congestive) heart failure: Secondary | ICD-10-CM | POA: Diagnosis not present

## 2022-01-07 DIAGNOSIS — E039 Hypothyroidism, unspecified: Secondary | ICD-10-CM | POA: Diagnosis not present

## 2022-01-07 DIAGNOSIS — R278 Other lack of coordination: Secondary | ICD-10-CM | POA: Diagnosis not present

## 2022-01-07 DIAGNOSIS — R2681 Unsteadiness on feet: Secondary | ICD-10-CM | POA: Diagnosis not present

## 2022-01-07 DIAGNOSIS — F03A0 Unspecified dementia, mild, without behavioral disturbance, psychotic disturbance, mood disturbance, and anxiety: Secondary | ICD-10-CM | POA: Diagnosis not present

## 2022-01-08 DIAGNOSIS — R278 Other lack of coordination: Secondary | ICD-10-CM | POA: Diagnosis not present

## 2022-01-08 DIAGNOSIS — F03A0 Unspecified dementia, mild, without behavioral disturbance, psychotic disturbance, mood disturbance, and anxiety: Secondary | ICD-10-CM | POA: Diagnosis not present

## 2022-01-08 DIAGNOSIS — M6259 Muscle wasting and atrophy, not elsewhere classified, multiple sites: Secondary | ICD-10-CM | POA: Diagnosis not present

## 2022-01-08 DIAGNOSIS — G20 Parkinson's disease: Secondary | ICD-10-CM | POA: Diagnosis not present

## 2022-01-08 DIAGNOSIS — R1311 Dysphagia, oral phase: Secondary | ICD-10-CM | POA: Diagnosis not present

## 2022-01-08 DIAGNOSIS — M6281 Muscle weakness (generalized): Secondary | ICD-10-CM | POA: Diagnosis not present

## 2022-01-08 DIAGNOSIS — I10 Essential (primary) hypertension: Secondary | ICD-10-CM | POA: Diagnosis not present

## 2022-01-08 DIAGNOSIS — R2681 Unsteadiness on feet: Secondary | ICD-10-CM | POA: Diagnosis not present

## 2022-01-08 DIAGNOSIS — G4733 Obstructive sleep apnea (adult) (pediatric): Secondary | ICD-10-CM | POA: Diagnosis not present

## 2022-01-08 DIAGNOSIS — N179 Acute kidney failure, unspecified: Secondary | ICD-10-CM | POA: Diagnosis not present

## 2022-01-08 DIAGNOSIS — I5022 Chronic systolic (congestive) heart failure: Secondary | ICD-10-CM | POA: Diagnosis not present

## 2022-01-08 DIAGNOSIS — I480 Paroxysmal atrial fibrillation: Secondary | ICD-10-CM | POA: Diagnosis not present

## 2022-01-08 DIAGNOSIS — R293 Abnormal posture: Secondary | ICD-10-CM | POA: Diagnosis not present

## 2022-01-08 DIAGNOSIS — R1312 Dysphagia, oropharyngeal phase: Secondary | ICD-10-CM | POA: Diagnosis not present

## 2022-01-08 DIAGNOSIS — N139 Obstructive and reflux uropathy, unspecified: Secondary | ICD-10-CM | POA: Diagnosis not present

## 2022-01-08 DIAGNOSIS — E039 Hypothyroidism, unspecified: Secondary | ICD-10-CM | POA: Diagnosis not present

## 2022-01-09 DIAGNOSIS — E039 Hypothyroidism, unspecified: Secondary | ICD-10-CM | POA: Diagnosis not present

## 2022-01-09 DIAGNOSIS — R2681 Unsteadiness on feet: Secondary | ICD-10-CM | POA: Diagnosis not present

## 2022-01-09 DIAGNOSIS — N179 Acute kidney failure, unspecified: Secondary | ICD-10-CM | POA: Diagnosis not present

## 2022-01-09 DIAGNOSIS — R1311 Dysphagia, oral phase: Secondary | ICD-10-CM | POA: Diagnosis not present

## 2022-01-09 DIAGNOSIS — I5022 Chronic systolic (congestive) heart failure: Secondary | ICD-10-CM | POA: Diagnosis not present

## 2022-01-09 DIAGNOSIS — M6281 Muscle weakness (generalized): Secondary | ICD-10-CM | POA: Diagnosis not present

## 2022-01-09 DIAGNOSIS — G4733 Obstructive sleep apnea (adult) (pediatric): Secondary | ICD-10-CM | POA: Diagnosis not present

## 2022-01-09 DIAGNOSIS — M6259 Muscle wasting and atrophy, not elsewhere classified, multiple sites: Secondary | ICD-10-CM | POA: Diagnosis not present

## 2022-01-09 DIAGNOSIS — G20 Parkinson's disease: Secondary | ICD-10-CM | POA: Diagnosis not present

## 2022-01-09 DIAGNOSIS — F03A0 Unspecified dementia, mild, without behavioral disturbance, psychotic disturbance, mood disturbance, and anxiety: Secondary | ICD-10-CM | POA: Diagnosis not present

## 2022-01-09 DIAGNOSIS — R1312 Dysphagia, oropharyngeal phase: Secondary | ICD-10-CM | POA: Diagnosis not present

## 2022-01-09 DIAGNOSIS — R278 Other lack of coordination: Secondary | ICD-10-CM | POA: Diagnosis not present

## 2022-01-09 DIAGNOSIS — I480 Paroxysmal atrial fibrillation: Secondary | ICD-10-CM | POA: Diagnosis not present

## 2022-01-09 DIAGNOSIS — N139 Obstructive and reflux uropathy, unspecified: Secondary | ICD-10-CM | POA: Diagnosis not present

## 2022-01-09 DIAGNOSIS — R293 Abnormal posture: Secondary | ICD-10-CM | POA: Diagnosis not present

## 2022-01-09 DIAGNOSIS — I10 Essential (primary) hypertension: Secondary | ICD-10-CM | POA: Diagnosis not present

## 2022-01-10 DIAGNOSIS — G4733 Obstructive sleep apnea (adult) (pediatric): Secondary | ICD-10-CM | POA: Diagnosis not present

## 2022-01-10 DIAGNOSIS — I5022 Chronic systolic (congestive) heart failure: Secondary | ICD-10-CM | POA: Diagnosis not present

## 2022-01-10 DIAGNOSIS — G20 Parkinson's disease: Secondary | ICD-10-CM | POA: Diagnosis not present

## 2022-01-10 DIAGNOSIS — R278 Other lack of coordination: Secondary | ICD-10-CM | POA: Diagnosis not present

## 2022-01-10 DIAGNOSIS — R1311 Dysphagia, oral phase: Secondary | ICD-10-CM | POA: Diagnosis not present

## 2022-01-10 DIAGNOSIS — E039 Hypothyroidism, unspecified: Secondary | ICD-10-CM | POA: Diagnosis not present

## 2022-01-10 DIAGNOSIS — R2681 Unsteadiness on feet: Secondary | ICD-10-CM | POA: Diagnosis not present

## 2022-01-10 DIAGNOSIS — R293 Abnormal posture: Secondary | ICD-10-CM | POA: Diagnosis not present

## 2022-01-10 DIAGNOSIS — N179 Acute kidney failure, unspecified: Secondary | ICD-10-CM | POA: Diagnosis not present

## 2022-01-10 DIAGNOSIS — N139 Obstructive and reflux uropathy, unspecified: Secondary | ICD-10-CM | POA: Diagnosis not present

## 2022-01-10 DIAGNOSIS — R1312 Dysphagia, oropharyngeal phase: Secondary | ICD-10-CM | POA: Diagnosis not present

## 2022-01-10 DIAGNOSIS — F03A0 Unspecified dementia, mild, without behavioral disturbance, psychotic disturbance, mood disturbance, and anxiety: Secondary | ICD-10-CM | POA: Diagnosis not present

## 2022-01-10 DIAGNOSIS — M6281 Muscle weakness (generalized): Secondary | ICD-10-CM | POA: Diagnosis not present

## 2022-01-10 DIAGNOSIS — M6259 Muscle wasting and atrophy, not elsewhere classified, multiple sites: Secondary | ICD-10-CM | POA: Diagnosis not present

## 2022-01-10 DIAGNOSIS — I480 Paroxysmal atrial fibrillation: Secondary | ICD-10-CM | POA: Diagnosis not present

## 2022-01-10 DIAGNOSIS — I10 Essential (primary) hypertension: Secondary | ICD-10-CM | POA: Diagnosis not present

## 2022-01-11 DIAGNOSIS — M6281 Muscle weakness (generalized): Secondary | ICD-10-CM | POA: Diagnosis not present

## 2022-01-11 DIAGNOSIS — R293 Abnormal posture: Secondary | ICD-10-CM | POA: Diagnosis not present

## 2022-01-11 DIAGNOSIS — R339 Retention of urine, unspecified: Secondary | ICD-10-CM | POA: Diagnosis not present

## 2022-01-11 DIAGNOSIS — M6530 Trigger finger, unspecified finger: Secondary | ICD-10-CM | POA: Diagnosis not present

## 2022-01-11 DIAGNOSIS — I5030 Unspecified diastolic (congestive) heart failure: Secondary | ICD-10-CM | POA: Diagnosis not present

## 2022-01-11 DIAGNOSIS — I10 Essential (primary) hypertension: Secondary | ICD-10-CM | POA: Diagnosis not present

## 2022-01-11 DIAGNOSIS — I480 Paroxysmal atrial fibrillation: Secondary | ICD-10-CM | POA: Diagnosis not present

## 2022-01-11 DIAGNOSIS — R1310 Dysphagia, unspecified: Secondary | ICD-10-CM | POA: Diagnosis not present

## 2022-01-11 DIAGNOSIS — N179 Acute kidney failure, unspecified: Secondary | ICD-10-CM | POA: Diagnosis not present

## 2022-01-11 DIAGNOSIS — R2681 Unsteadiness on feet: Secondary | ICD-10-CM | POA: Diagnosis not present

## 2022-01-11 DIAGNOSIS — N183 Chronic kidney disease, stage 3 unspecified: Secondary | ICD-10-CM | POA: Diagnosis not present

## 2022-01-11 DIAGNOSIS — E039 Hypothyroidism, unspecified: Secondary | ICD-10-CM | POA: Diagnosis not present

## 2022-01-11 DIAGNOSIS — G4733 Obstructive sleep apnea (adult) (pediatric): Secondary | ICD-10-CM | POA: Diagnosis not present

## 2022-01-11 DIAGNOSIS — N139 Obstructive and reflux uropathy, unspecified: Secondary | ICD-10-CM | POA: Diagnosis not present

## 2022-01-11 DIAGNOSIS — G20 Parkinson's disease: Secondary | ICD-10-CM | POA: Diagnosis not present

## 2022-01-11 DIAGNOSIS — F03A0 Unspecified dementia, mild, without behavioral disturbance, psychotic disturbance, mood disturbance, and anxiety: Secondary | ICD-10-CM | POA: Diagnosis not present

## 2022-01-11 DIAGNOSIS — R278 Other lack of coordination: Secondary | ICD-10-CM | POA: Diagnosis not present

## 2022-01-11 DIAGNOSIS — E46 Unspecified protein-calorie malnutrition: Secondary | ICD-10-CM | POA: Diagnosis not present

## 2022-01-11 DIAGNOSIS — R1312 Dysphagia, oropharyngeal phase: Secondary | ICD-10-CM | POA: Diagnosis not present

## 2022-01-11 DIAGNOSIS — M6259 Muscle wasting and atrophy, not elsewhere classified, multiple sites: Secondary | ICD-10-CM | POA: Diagnosis not present

## 2022-01-11 DIAGNOSIS — R1311 Dysphagia, oral phase: Secondary | ICD-10-CM | POA: Diagnosis not present

## 2022-01-11 DIAGNOSIS — I5022 Chronic systolic (congestive) heart failure: Secondary | ICD-10-CM | POA: Diagnosis not present

## 2022-01-11 DIAGNOSIS — I4891 Unspecified atrial fibrillation: Secondary | ICD-10-CM | POA: Diagnosis not present

## 2022-01-13 DIAGNOSIS — M6259 Muscle wasting and atrophy, not elsewhere classified, multiple sites: Secondary | ICD-10-CM | POA: Diagnosis not present

## 2022-01-13 DIAGNOSIS — G20 Parkinson's disease: Secondary | ICD-10-CM | POA: Diagnosis not present

## 2022-01-13 DIAGNOSIS — F03A0 Unspecified dementia, mild, without behavioral disturbance, psychotic disturbance, mood disturbance, and anxiety: Secondary | ICD-10-CM | POA: Diagnosis not present

## 2022-01-13 DIAGNOSIS — G629 Polyneuropathy, unspecified: Secondary | ICD-10-CM | POA: Diagnosis not present

## 2022-01-13 DIAGNOSIS — R1312 Dysphagia, oropharyngeal phase: Secondary | ICD-10-CM | POA: Diagnosis not present

## 2022-01-13 DIAGNOSIS — R1311 Dysphagia, oral phase: Secondary | ICD-10-CM | POA: Diagnosis not present

## 2022-01-13 DIAGNOSIS — N179 Acute kidney failure, unspecified: Secondary | ICD-10-CM | POA: Diagnosis not present

## 2022-01-13 DIAGNOSIS — I5022 Chronic systolic (congestive) heart failure: Secondary | ICD-10-CM | POA: Diagnosis not present

## 2022-01-13 DIAGNOSIS — K219 Gastro-esophageal reflux disease without esophagitis: Secondary | ICD-10-CM | POA: Diagnosis not present

## 2022-01-13 DIAGNOSIS — K5900 Constipation, unspecified: Secondary | ICD-10-CM | POA: Diagnosis not present

## 2022-01-13 DIAGNOSIS — I4891 Unspecified atrial fibrillation: Secondary | ICD-10-CM | POA: Diagnosis not present

## 2022-01-13 DIAGNOSIS — M1990 Unspecified osteoarthritis, unspecified site: Secondary | ICD-10-CM | POA: Diagnosis not present

## 2022-01-13 DIAGNOSIS — E039 Hypothyroidism, unspecified: Secondary | ICD-10-CM | POA: Diagnosis not present

## 2022-01-13 DIAGNOSIS — N139 Obstructive and reflux uropathy, unspecified: Secondary | ICD-10-CM | POA: Diagnosis not present

## 2022-01-13 DIAGNOSIS — R278 Other lack of coordination: Secondary | ICD-10-CM | POA: Diagnosis not present

## 2022-01-13 DIAGNOSIS — G4733 Obstructive sleep apnea (adult) (pediatric): Secondary | ICD-10-CM | POA: Diagnosis not present

## 2022-01-13 DIAGNOSIS — I10 Essential (primary) hypertension: Secondary | ICD-10-CM | POA: Diagnosis not present

## 2022-01-13 DIAGNOSIS — R293 Abnormal posture: Secondary | ICD-10-CM | POA: Diagnosis not present

## 2022-01-13 DIAGNOSIS — R2681 Unsteadiness on feet: Secondary | ICD-10-CM | POA: Diagnosis not present

## 2022-01-13 DIAGNOSIS — I480 Paroxysmal atrial fibrillation: Secondary | ICD-10-CM | POA: Diagnosis not present

## 2022-01-13 DIAGNOSIS — I5020 Unspecified systolic (congestive) heart failure: Secondary | ICD-10-CM | POA: Diagnosis not present

## 2022-01-13 DIAGNOSIS — M6281 Muscle weakness (generalized): Secondary | ICD-10-CM | POA: Diagnosis not present

## 2022-01-14 DIAGNOSIS — G4733 Obstructive sleep apnea (adult) (pediatric): Secondary | ICD-10-CM | POA: Diagnosis not present

## 2022-01-14 DIAGNOSIS — R278 Other lack of coordination: Secondary | ICD-10-CM | POA: Diagnosis not present

## 2022-01-14 DIAGNOSIS — I480 Paroxysmal atrial fibrillation: Secondary | ICD-10-CM | POA: Diagnosis not present

## 2022-01-14 DIAGNOSIS — I10 Essential (primary) hypertension: Secondary | ICD-10-CM | POA: Diagnosis not present

## 2022-01-14 DIAGNOSIS — N139 Obstructive and reflux uropathy, unspecified: Secondary | ICD-10-CM | POA: Diagnosis not present

## 2022-01-14 DIAGNOSIS — R1312 Dysphagia, oropharyngeal phase: Secondary | ICD-10-CM | POA: Diagnosis not present

## 2022-01-14 DIAGNOSIS — G20 Parkinson's disease: Secondary | ICD-10-CM | POA: Diagnosis not present

## 2022-01-14 DIAGNOSIS — N179 Acute kidney failure, unspecified: Secondary | ICD-10-CM | POA: Diagnosis not present

## 2022-01-14 DIAGNOSIS — R1311 Dysphagia, oral phase: Secondary | ICD-10-CM | POA: Diagnosis not present

## 2022-01-14 DIAGNOSIS — E039 Hypothyroidism, unspecified: Secondary | ICD-10-CM | POA: Diagnosis not present

## 2022-01-14 DIAGNOSIS — I5022 Chronic systolic (congestive) heart failure: Secondary | ICD-10-CM | POA: Diagnosis not present

## 2022-01-14 DIAGNOSIS — F03A0 Unspecified dementia, mild, without behavioral disturbance, psychotic disturbance, mood disturbance, and anxiety: Secondary | ICD-10-CM | POA: Diagnosis not present

## 2022-01-14 DIAGNOSIS — R293 Abnormal posture: Secondary | ICD-10-CM | POA: Diagnosis not present

## 2022-01-14 DIAGNOSIS — M6259 Muscle wasting and atrophy, not elsewhere classified, multiple sites: Secondary | ICD-10-CM | POA: Diagnosis not present

## 2022-01-14 DIAGNOSIS — R2681 Unsteadiness on feet: Secondary | ICD-10-CM | POA: Diagnosis not present

## 2022-01-14 DIAGNOSIS — M6281 Muscle weakness (generalized): Secondary | ICD-10-CM | POA: Diagnosis not present

## 2022-01-15 DIAGNOSIS — E039 Hypothyroidism, unspecified: Secondary | ICD-10-CM | POA: Diagnosis not present

## 2022-01-15 DIAGNOSIS — G4733 Obstructive sleep apnea (adult) (pediatric): Secondary | ICD-10-CM | POA: Diagnosis not present

## 2022-01-15 DIAGNOSIS — I480 Paroxysmal atrial fibrillation: Secondary | ICD-10-CM | POA: Diagnosis not present

## 2022-01-15 DIAGNOSIS — R278 Other lack of coordination: Secondary | ICD-10-CM | POA: Diagnosis not present

## 2022-01-15 DIAGNOSIS — R1311 Dysphagia, oral phase: Secondary | ICD-10-CM | POA: Diagnosis not present

## 2022-01-15 DIAGNOSIS — N139 Obstructive and reflux uropathy, unspecified: Secondary | ICD-10-CM | POA: Diagnosis not present

## 2022-01-15 DIAGNOSIS — R293 Abnormal posture: Secondary | ICD-10-CM | POA: Diagnosis not present

## 2022-01-15 DIAGNOSIS — G20 Parkinson's disease: Secondary | ICD-10-CM | POA: Diagnosis not present

## 2022-01-15 DIAGNOSIS — N179 Acute kidney failure, unspecified: Secondary | ICD-10-CM | POA: Diagnosis not present

## 2022-01-15 DIAGNOSIS — I5022 Chronic systolic (congestive) heart failure: Secondary | ICD-10-CM | POA: Diagnosis not present

## 2022-01-15 DIAGNOSIS — M6281 Muscle weakness (generalized): Secondary | ICD-10-CM | POA: Diagnosis not present

## 2022-01-15 DIAGNOSIS — R1312 Dysphagia, oropharyngeal phase: Secondary | ICD-10-CM | POA: Diagnosis not present

## 2022-01-15 DIAGNOSIS — F03A0 Unspecified dementia, mild, without behavioral disturbance, psychotic disturbance, mood disturbance, and anxiety: Secondary | ICD-10-CM | POA: Diagnosis not present

## 2022-01-15 DIAGNOSIS — I10 Essential (primary) hypertension: Secondary | ICD-10-CM | POA: Diagnosis not present

## 2022-01-15 DIAGNOSIS — M6259 Muscle wasting and atrophy, not elsewhere classified, multiple sites: Secondary | ICD-10-CM | POA: Diagnosis not present

## 2022-01-15 DIAGNOSIS — R2681 Unsteadiness on feet: Secondary | ICD-10-CM | POA: Diagnosis not present

## 2022-01-16 DIAGNOSIS — F03A0 Unspecified dementia, mild, without behavioral disturbance, psychotic disturbance, mood disturbance, and anxiety: Secondary | ICD-10-CM | POA: Diagnosis not present

## 2022-01-16 DIAGNOSIS — M6281 Muscle weakness (generalized): Secondary | ICD-10-CM | POA: Diagnosis not present

## 2022-01-16 DIAGNOSIS — G20 Parkinson's disease: Secondary | ICD-10-CM | POA: Diagnosis not present

## 2022-01-16 DIAGNOSIS — N139 Obstructive and reflux uropathy, unspecified: Secondary | ICD-10-CM | POA: Diagnosis not present

## 2022-01-16 DIAGNOSIS — R293 Abnormal posture: Secondary | ICD-10-CM | POA: Diagnosis not present

## 2022-01-16 DIAGNOSIS — R2681 Unsteadiness on feet: Secondary | ICD-10-CM | POA: Diagnosis not present

## 2022-01-16 DIAGNOSIS — G4733 Obstructive sleep apnea (adult) (pediatric): Secondary | ICD-10-CM | POA: Diagnosis not present

## 2022-01-16 DIAGNOSIS — R278 Other lack of coordination: Secondary | ICD-10-CM | POA: Diagnosis not present

## 2022-01-16 DIAGNOSIS — I5022 Chronic systolic (congestive) heart failure: Secondary | ICD-10-CM | POA: Diagnosis not present

## 2022-01-16 DIAGNOSIS — E039 Hypothyroidism, unspecified: Secondary | ICD-10-CM | POA: Diagnosis not present

## 2022-01-16 DIAGNOSIS — I10 Essential (primary) hypertension: Secondary | ICD-10-CM | POA: Diagnosis not present

## 2022-01-16 DIAGNOSIS — N179 Acute kidney failure, unspecified: Secondary | ICD-10-CM | POA: Diagnosis not present

## 2022-01-16 DIAGNOSIS — M6259 Muscle wasting and atrophy, not elsewhere classified, multiple sites: Secondary | ICD-10-CM | POA: Diagnosis not present

## 2022-01-16 DIAGNOSIS — R1311 Dysphagia, oral phase: Secondary | ICD-10-CM | POA: Diagnosis not present

## 2022-01-16 DIAGNOSIS — R1312 Dysphagia, oropharyngeal phase: Secondary | ICD-10-CM | POA: Diagnosis not present

## 2022-01-16 DIAGNOSIS — I480 Paroxysmal atrial fibrillation: Secondary | ICD-10-CM | POA: Diagnosis not present

## 2022-01-17 DIAGNOSIS — G4733 Obstructive sleep apnea (adult) (pediatric): Secondary | ICD-10-CM | POA: Diagnosis not present

## 2022-01-17 DIAGNOSIS — R1311 Dysphagia, oral phase: Secondary | ICD-10-CM | POA: Diagnosis not present

## 2022-01-17 DIAGNOSIS — M6259 Muscle wasting and atrophy, not elsewhere classified, multiple sites: Secondary | ICD-10-CM | POA: Diagnosis not present

## 2022-01-17 DIAGNOSIS — F03A0 Unspecified dementia, mild, without behavioral disturbance, psychotic disturbance, mood disturbance, and anxiety: Secondary | ICD-10-CM | POA: Diagnosis not present

## 2022-01-17 DIAGNOSIS — R1312 Dysphagia, oropharyngeal phase: Secondary | ICD-10-CM | POA: Diagnosis not present

## 2022-01-17 DIAGNOSIS — R278 Other lack of coordination: Secondary | ICD-10-CM | POA: Diagnosis not present

## 2022-01-17 DIAGNOSIS — I10 Essential (primary) hypertension: Secondary | ICD-10-CM | POA: Diagnosis not present

## 2022-01-17 DIAGNOSIS — I480 Paroxysmal atrial fibrillation: Secondary | ICD-10-CM | POA: Diagnosis not present

## 2022-01-17 DIAGNOSIS — G20 Parkinson's disease: Secondary | ICD-10-CM | POA: Diagnosis not present

## 2022-01-17 DIAGNOSIS — I5022 Chronic systolic (congestive) heart failure: Secondary | ICD-10-CM | POA: Diagnosis not present

## 2022-01-17 DIAGNOSIS — N179 Acute kidney failure, unspecified: Secondary | ICD-10-CM | POA: Diagnosis not present

## 2022-01-17 DIAGNOSIS — E039 Hypothyroidism, unspecified: Secondary | ICD-10-CM | POA: Diagnosis not present

## 2022-01-17 DIAGNOSIS — R293 Abnormal posture: Secondary | ICD-10-CM | POA: Diagnosis not present

## 2022-01-17 DIAGNOSIS — N139 Obstructive and reflux uropathy, unspecified: Secondary | ICD-10-CM | POA: Diagnosis not present

## 2022-01-17 DIAGNOSIS — R2681 Unsteadiness on feet: Secondary | ICD-10-CM | POA: Diagnosis not present

## 2022-01-17 DIAGNOSIS — M6281 Muscle weakness (generalized): Secondary | ICD-10-CM | POA: Diagnosis not present

## 2022-01-17 DIAGNOSIS — N390 Urinary tract infection, site not specified: Secondary | ICD-10-CM | POA: Diagnosis not present

## 2022-01-18 DIAGNOSIS — R293 Abnormal posture: Secondary | ICD-10-CM | POA: Diagnosis not present

## 2022-01-18 DIAGNOSIS — I10 Essential (primary) hypertension: Secondary | ICD-10-CM | POA: Diagnosis not present

## 2022-01-18 DIAGNOSIS — M6281 Muscle weakness (generalized): Secondary | ICD-10-CM | POA: Diagnosis not present

## 2022-01-18 DIAGNOSIS — G20 Parkinson's disease: Secondary | ICD-10-CM | POA: Diagnosis not present

## 2022-01-18 DIAGNOSIS — R1312 Dysphagia, oropharyngeal phase: Secondary | ICD-10-CM | POA: Diagnosis not present

## 2022-01-18 DIAGNOSIS — R1311 Dysphagia, oral phase: Secondary | ICD-10-CM | POA: Diagnosis not present

## 2022-01-18 DIAGNOSIS — I5022 Chronic systolic (congestive) heart failure: Secondary | ICD-10-CM | POA: Diagnosis not present

## 2022-01-18 DIAGNOSIS — F03A0 Unspecified dementia, mild, without behavioral disturbance, psychotic disturbance, mood disturbance, and anxiety: Secondary | ICD-10-CM | POA: Diagnosis not present

## 2022-01-18 DIAGNOSIS — N139 Obstructive and reflux uropathy, unspecified: Secondary | ICD-10-CM | POA: Diagnosis not present

## 2022-01-18 DIAGNOSIS — R278 Other lack of coordination: Secondary | ICD-10-CM | POA: Diagnosis not present

## 2022-01-18 DIAGNOSIS — E039 Hypothyroidism, unspecified: Secondary | ICD-10-CM | POA: Diagnosis not present

## 2022-01-18 DIAGNOSIS — G4733 Obstructive sleep apnea (adult) (pediatric): Secondary | ICD-10-CM | POA: Diagnosis not present

## 2022-01-18 DIAGNOSIS — N179 Acute kidney failure, unspecified: Secondary | ICD-10-CM | POA: Diagnosis not present

## 2022-01-18 DIAGNOSIS — M6259 Muscle wasting and atrophy, not elsewhere classified, multiple sites: Secondary | ICD-10-CM | POA: Diagnosis not present

## 2022-01-18 DIAGNOSIS — I480 Paroxysmal atrial fibrillation: Secondary | ICD-10-CM | POA: Diagnosis not present

## 2022-01-18 DIAGNOSIS — R2681 Unsteadiness on feet: Secondary | ICD-10-CM | POA: Diagnosis not present

## 2022-01-19 DIAGNOSIS — I4891 Unspecified atrial fibrillation: Secondary | ICD-10-CM | POA: Diagnosis not present

## 2022-01-19 DIAGNOSIS — R1310 Dysphagia, unspecified: Secondary | ICD-10-CM | POA: Diagnosis not present

## 2022-01-19 DIAGNOSIS — I5020 Unspecified systolic (congestive) heart failure: Secondary | ICD-10-CM | POA: Diagnosis not present

## 2022-01-19 DIAGNOSIS — G629 Polyneuropathy, unspecified: Secondary | ICD-10-CM | POA: Diagnosis not present

## 2022-01-19 DIAGNOSIS — M6281 Muscle weakness (generalized): Secondary | ICD-10-CM | POA: Diagnosis not present

## 2022-01-19 DIAGNOSIS — K219 Gastro-esophageal reflux disease without esophagitis: Secondary | ICD-10-CM | POA: Diagnosis not present

## 2022-01-19 DIAGNOSIS — G20 Parkinson's disease: Secondary | ICD-10-CM | POA: Diagnosis not present

## 2022-01-19 DIAGNOSIS — K5900 Constipation, unspecified: Secondary | ICD-10-CM | POA: Diagnosis not present

## 2022-01-21 DIAGNOSIS — N179 Acute kidney failure, unspecified: Secondary | ICD-10-CM | POA: Diagnosis not present

## 2022-01-21 DIAGNOSIS — G20 Parkinson's disease: Secondary | ICD-10-CM | POA: Diagnosis not present

## 2022-01-21 DIAGNOSIS — M1990 Unspecified osteoarthritis, unspecified site: Secondary | ICD-10-CM | POA: Diagnosis not present

## 2022-01-21 DIAGNOSIS — E039 Hypothyroidism, unspecified: Secondary | ICD-10-CM | POA: Diagnosis not present

## 2022-01-21 DIAGNOSIS — M6259 Muscle wasting and atrophy, not elsewhere classified, multiple sites: Secondary | ICD-10-CM | POA: Diagnosis not present

## 2022-01-21 DIAGNOSIS — R1311 Dysphagia, oral phase: Secondary | ICD-10-CM | POA: Diagnosis not present

## 2022-01-21 DIAGNOSIS — F03A0 Unspecified dementia, mild, without behavioral disturbance, psychotic disturbance, mood disturbance, and anxiety: Secondary | ICD-10-CM | POA: Diagnosis not present

## 2022-01-21 DIAGNOSIS — R1312 Dysphagia, oropharyngeal phase: Secondary | ICD-10-CM | POA: Diagnosis not present

## 2022-01-21 DIAGNOSIS — M24541 Contracture, right hand: Secondary | ICD-10-CM | POA: Diagnosis not present

## 2022-01-21 DIAGNOSIS — I5020 Unspecified systolic (congestive) heart failure: Secondary | ICD-10-CM | POA: Diagnosis not present

## 2022-01-21 DIAGNOSIS — I4891 Unspecified atrial fibrillation: Secondary | ICD-10-CM | POA: Diagnosis not present

## 2022-01-21 DIAGNOSIS — R293 Abnormal posture: Secondary | ICD-10-CM | POA: Diagnosis not present

## 2022-01-21 DIAGNOSIS — N390 Urinary tract infection, site not specified: Secondary | ICD-10-CM | POA: Diagnosis not present

## 2022-01-21 DIAGNOSIS — I480 Paroxysmal atrial fibrillation: Secondary | ICD-10-CM | POA: Diagnosis not present

## 2022-01-21 DIAGNOSIS — M6281 Muscle weakness (generalized): Secondary | ICD-10-CM | POA: Diagnosis not present

## 2022-01-21 DIAGNOSIS — I5022 Chronic systolic (congestive) heart failure: Secondary | ICD-10-CM | POA: Diagnosis not present

## 2022-01-21 DIAGNOSIS — I10 Essential (primary) hypertension: Secondary | ICD-10-CM | POA: Diagnosis not present

## 2022-01-21 DIAGNOSIS — R197 Diarrhea, unspecified: Secondary | ICD-10-CM | POA: Diagnosis not present

## 2022-01-21 DIAGNOSIS — N139 Obstructive and reflux uropathy, unspecified: Secondary | ICD-10-CM | POA: Diagnosis not present

## 2022-01-21 DIAGNOSIS — R278 Other lack of coordination: Secondary | ICD-10-CM | POA: Diagnosis not present

## 2022-01-21 DIAGNOSIS — R2681 Unsteadiness on feet: Secondary | ICD-10-CM | POA: Diagnosis not present

## 2022-01-21 DIAGNOSIS — G4733 Obstructive sleep apnea (adult) (pediatric): Secondary | ICD-10-CM | POA: Diagnosis not present

## 2022-01-22 DIAGNOSIS — N139 Obstructive and reflux uropathy, unspecified: Secondary | ICD-10-CM | POA: Diagnosis not present

## 2022-01-22 DIAGNOSIS — M6259 Muscle wasting and atrophy, not elsewhere classified, multiple sites: Secondary | ICD-10-CM | POA: Diagnosis not present

## 2022-01-22 DIAGNOSIS — G20 Parkinson's disease: Secondary | ICD-10-CM | POA: Diagnosis not present

## 2022-01-22 DIAGNOSIS — I10 Essential (primary) hypertension: Secondary | ICD-10-CM | POA: Diagnosis not present

## 2022-01-22 DIAGNOSIS — I480 Paroxysmal atrial fibrillation: Secondary | ICD-10-CM | POA: Diagnosis not present

## 2022-01-22 DIAGNOSIS — E039 Hypothyroidism, unspecified: Secondary | ICD-10-CM | POA: Diagnosis not present

## 2022-01-22 DIAGNOSIS — R2681 Unsteadiness on feet: Secondary | ICD-10-CM | POA: Diagnosis not present

## 2022-01-22 DIAGNOSIS — R293 Abnormal posture: Secondary | ICD-10-CM | POA: Diagnosis not present

## 2022-01-22 DIAGNOSIS — R1312 Dysphagia, oropharyngeal phase: Secondary | ICD-10-CM | POA: Diagnosis not present

## 2022-01-22 DIAGNOSIS — I5022 Chronic systolic (congestive) heart failure: Secondary | ICD-10-CM | POA: Diagnosis not present

## 2022-01-22 DIAGNOSIS — F03A0 Unspecified dementia, mild, without behavioral disturbance, psychotic disturbance, mood disturbance, and anxiety: Secondary | ICD-10-CM | POA: Diagnosis not present

## 2022-01-22 DIAGNOSIS — R278 Other lack of coordination: Secondary | ICD-10-CM | POA: Diagnosis not present

## 2022-01-22 DIAGNOSIS — G4733 Obstructive sleep apnea (adult) (pediatric): Secondary | ICD-10-CM | POA: Diagnosis not present

## 2022-01-22 DIAGNOSIS — R1311 Dysphagia, oral phase: Secondary | ICD-10-CM | POA: Diagnosis not present

## 2022-01-22 DIAGNOSIS — N179 Acute kidney failure, unspecified: Secondary | ICD-10-CM | POA: Diagnosis not present

## 2022-01-22 DIAGNOSIS — M6281 Muscle weakness (generalized): Secondary | ICD-10-CM | POA: Diagnosis not present

## 2022-01-23 DIAGNOSIS — R1311 Dysphagia, oral phase: Secondary | ICD-10-CM | POA: Diagnosis not present

## 2022-01-23 DIAGNOSIS — R293 Abnormal posture: Secondary | ICD-10-CM | POA: Diagnosis not present

## 2022-01-23 DIAGNOSIS — I10 Essential (primary) hypertension: Secondary | ICD-10-CM | POA: Diagnosis not present

## 2022-01-23 DIAGNOSIS — G20 Parkinson's disease: Secondary | ICD-10-CM | POA: Diagnosis not present

## 2022-01-23 DIAGNOSIS — M6259 Muscle wasting and atrophy, not elsewhere classified, multiple sites: Secondary | ICD-10-CM | POA: Diagnosis not present

## 2022-01-23 DIAGNOSIS — I480 Paroxysmal atrial fibrillation: Secondary | ICD-10-CM | POA: Diagnosis not present

## 2022-01-23 DIAGNOSIS — F03A0 Unspecified dementia, mild, without behavioral disturbance, psychotic disturbance, mood disturbance, and anxiety: Secondary | ICD-10-CM | POA: Diagnosis not present

## 2022-01-23 DIAGNOSIS — N179 Acute kidney failure, unspecified: Secondary | ICD-10-CM | POA: Diagnosis not present

## 2022-01-23 DIAGNOSIS — I5022 Chronic systolic (congestive) heart failure: Secondary | ICD-10-CM | POA: Diagnosis not present

## 2022-01-23 DIAGNOSIS — R278 Other lack of coordination: Secondary | ICD-10-CM | POA: Diagnosis not present

## 2022-01-23 DIAGNOSIS — R1312 Dysphagia, oropharyngeal phase: Secondary | ICD-10-CM | POA: Diagnosis not present

## 2022-01-23 DIAGNOSIS — M6281 Muscle weakness (generalized): Secondary | ICD-10-CM | POA: Diagnosis not present

## 2022-01-23 DIAGNOSIS — R2681 Unsteadiness on feet: Secondary | ICD-10-CM | POA: Diagnosis not present

## 2022-01-23 DIAGNOSIS — G4733 Obstructive sleep apnea (adult) (pediatric): Secondary | ICD-10-CM | POA: Diagnosis not present

## 2022-01-23 DIAGNOSIS — N139 Obstructive and reflux uropathy, unspecified: Secondary | ICD-10-CM | POA: Diagnosis not present

## 2022-01-23 DIAGNOSIS — E039 Hypothyroidism, unspecified: Secondary | ICD-10-CM | POA: Diagnosis not present

## 2022-01-24 DIAGNOSIS — M6281 Muscle weakness (generalized): Secondary | ICD-10-CM | POA: Diagnosis not present

## 2022-01-24 DIAGNOSIS — R278 Other lack of coordination: Secondary | ICD-10-CM | POA: Diagnosis not present

## 2022-01-24 DIAGNOSIS — I10 Essential (primary) hypertension: Secondary | ICD-10-CM | POA: Diagnosis not present

## 2022-01-24 DIAGNOSIS — R2681 Unsteadiness on feet: Secondary | ICD-10-CM | POA: Diagnosis not present

## 2022-01-24 DIAGNOSIS — N179 Acute kidney failure, unspecified: Secondary | ICD-10-CM | POA: Diagnosis not present

## 2022-01-24 DIAGNOSIS — F03A0 Unspecified dementia, mild, without behavioral disturbance, psychotic disturbance, mood disturbance, and anxiety: Secondary | ICD-10-CM | POA: Diagnosis not present

## 2022-01-24 DIAGNOSIS — L98419 Non-pressure chronic ulcer of buttock with unspecified severity: Secondary | ICD-10-CM | POA: Diagnosis not present

## 2022-01-24 DIAGNOSIS — R293 Abnormal posture: Secondary | ICD-10-CM | POA: Diagnosis not present

## 2022-01-24 DIAGNOSIS — I480 Paroxysmal atrial fibrillation: Secondary | ICD-10-CM | POA: Diagnosis not present

## 2022-01-24 DIAGNOSIS — N139 Obstructive and reflux uropathy, unspecified: Secondary | ICD-10-CM | POA: Diagnosis not present

## 2022-01-24 DIAGNOSIS — I5022 Chronic systolic (congestive) heart failure: Secondary | ICD-10-CM | POA: Diagnosis not present

## 2022-01-24 DIAGNOSIS — R1311 Dysphagia, oral phase: Secondary | ICD-10-CM | POA: Diagnosis not present

## 2022-01-24 DIAGNOSIS — M6259 Muscle wasting and atrophy, not elsewhere classified, multiple sites: Secondary | ICD-10-CM | POA: Diagnosis not present

## 2022-01-24 DIAGNOSIS — G4733 Obstructive sleep apnea (adult) (pediatric): Secondary | ICD-10-CM | POA: Diagnosis not present

## 2022-01-24 DIAGNOSIS — E039 Hypothyroidism, unspecified: Secondary | ICD-10-CM | POA: Diagnosis not present

## 2022-01-24 DIAGNOSIS — R1312 Dysphagia, oropharyngeal phase: Secondary | ICD-10-CM | POA: Diagnosis not present

## 2022-01-24 DIAGNOSIS — G20 Parkinson's disease: Secondary | ICD-10-CM | POA: Diagnosis not present

## 2022-01-25 DIAGNOSIS — I5022 Chronic systolic (congestive) heart failure: Secondary | ICD-10-CM | POA: Diagnosis not present

## 2022-01-25 DIAGNOSIS — M6281 Muscle weakness (generalized): Secondary | ICD-10-CM | POA: Diagnosis not present

## 2022-01-25 DIAGNOSIS — I480 Paroxysmal atrial fibrillation: Secondary | ICD-10-CM | POA: Diagnosis not present

## 2022-01-25 DIAGNOSIS — N179 Acute kidney failure, unspecified: Secondary | ICD-10-CM | POA: Diagnosis not present

## 2022-01-25 DIAGNOSIS — R278 Other lack of coordination: Secondary | ICD-10-CM | POA: Diagnosis not present

## 2022-01-25 DIAGNOSIS — M6259 Muscle wasting and atrophy, not elsewhere classified, multiple sites: Secondary | ICD-10-CM | POA: Diagnosis not present

## 2022-01-25 DIAGNOSIS — G4733 Obstructive sleep apnea (adult) (pediatric): Secondary | ICD-10-CM | POA: Diagnosis not present

## 2022-01-25 DIAGNOSIS — G20 Parkinson's disease: Secondary | ICD-10-CM | POA: Diagnosis not present

## 2022-01-25 DIAGNOSIS — I10 Essential (primary) hypertension: Secondary | ICD-10-CM | POA: Diagnosis not present

## 2022-01-25 DIAGNOSIS — M24541 Contracture, right hand: Secondary | ICD-10-CM | POA: Diagnosis not present

## 2022-01-25 DIAGNOSIS — R1312 Dysphagia, oropharyngeal phase: Secondary | ICD-10-CM | POA: Diagnosis not present

## 2022-01-25 DIAGNOSIS — N139 Obstructive and reflux uropathy, unspecified: Secondary | ICD-10-CM | POA: Diagnosis not present

## 2022-01-25 DIAGNOSIS — F03A0 Unspecified dementia, mild, without behavioral disturbance, psychotic disturbance, mood disturbance, and anxiety: Secondary | ICD-10-CM | POA: Diagnosis not present

## 2022-01-25 DIAGNOSIS — R293 Abnormal posture: Secondary | ICD-10-CM | POA: Diagnosis not present

## 2022-01-25 DIAGNOSIS — R1311 Dysphagia, oral phase: Secondary | ICD-10-CM | POA: Diagnosis not present

## 2022-01-25 DIAGNOSIS — I4891 Unspecified atrial fibrillation: Secondary | ICD-10-CM | POA: Diagnosis not present

## 2022-01-25 DIAGNOSIS — I5020 Unspecified systolic (congestive) heart failure: Secondary | ICD-10-CM | POA: Diagnosis not present

## 2022-01-25 DIAGNOSIS — E039 Hypothyroidism, unspecified: Secondary | ICD-10-CM | POA: Diagnosis not present

## 2022-01-25 DIAGNOSIS — R2681 Unsteadiness on feet: Secondary | ICD-10-CM | POA: Diagnosis not present

## 2022-01-28 ENCOUNTER — Telehealth: Payer: Self-pay | Admitting: Neurology

## 2022-01-28 MED ORDER — CARBIDOPA-LEVODOPA 25-100 MG PO TABS: ORAL_TABLET | ORAL | 1 refills | Status: DC

## 2022-01-28 NOTE — Telephone Encounter (Signed)
-----   Message from [place], DO sent at 12/25/2021  2:58 PM EDT ----- Call pt/family and see if tolerating levodopa and if helping at all.  If he is tolerating but its not helping, send new RX to [facility] for increaed dose of carbidopa/levodopa 25/100, 1 tablet at 7am/11am/4pm

## 2022-01-28 NOTE — Telephone Encounter (Signed)
Called and spoke to patients daughter and the patient had no adverse reaction to the medication and they are ready to up the dosage. No pharmacy was listed so patients daughter is calling me back to let me know were to send the prescription

## 2022-01-29 DIAGNOSIS — L98419 Non-pressure chronic ulcer of buttock with unspecified severity: Secondary | ICD-10-CM | POA: Diagnosis not present

## 2022-02-01 ENCOUNTER — Other Ambulatory Visit: Payer: Self-pay

## 2022-02-01 DIAGNOSIS — G20 Parkinson's disease: Secondary | ICD-10-CM

## 2022-02-01 MED ORDER — CARBIDOPA-LEVODOPA 25-100 MG PO TABS: ORAL_TABLET | ORAL | 1 refills | Status: AC

## 2022-02-06 DIAGNOSIS — I480 Paroxysmal atrial fibrillation: Secondary | ICD-10-CM | POA: Diagnosis not present

## 2022-02-06 DIAGNOSIS — M79641 Pain in right hand: Secondary | ICD-10-CM | POA: Diagnosis not present

## 2022-02-06 DIAGNOSIS — I5022 Chronic systolic (congestive) heart failure: Secondary | ICD-10-CM | POA: Diagnosis not present

## 2022-02-06 DIAGNOSIS — R1312 Dysphagia, oropharyngeal phase: Secondary | ICD-10-CM | POA: Diagnosis not present

## 2022-02-06 DIAGNOSIS — R278 Other lack of coordination: Secondary | ICD-10-CM | POA: Diagnosis not present

## 2022-02-06 DIAGNOSIS — R2681 Unsteadiness on feet: Secondary | ICD-10-CM | POA: Diagnosis not present

## 2022-02-06 DIAGNOSIS — E039 Hypothyroidism, unspecified: Secondary | ICD-10-CM | POA: Diagnosis not present

## 2022-02-06 DIAGNOSIS — M6281 Muscle weakness (generalized): Secondary | ICD-10-CM | POA: Diagnosis not present

## 2022-02-06 DIAGNOSIS — G4733 Obstructive sleep apnea (adult) (pediatric): Secondary | ICD-10-CM | POA: Diagnosis not present

## 2022-02-06 DIAGNOSIS — F03A0 Unspecified dementia, mild, without behavioral disturbance, psychotic disturbance, mood disturbance, and anxiety: Secondary | ICD-10-CM | POA: Diagnosis not present

## 2022-02-06 DIAGNOSIS — I10 Essential (primary) hypertension: Secondary | ICD-10-CM | POA: Diagnosis not present

## 2022-02-06 DIAGNOSIS — L98499 Non-pressure chronic ulcer of skin of other sites with unspecified severity: Secondary | ICD-10-CM | POA: Diagnosis not present

## 2022-02-06 DIAGNOSIS — N139 Obstructive and reflux uropathy, unspecified: Secondary | ICD-10-CM | POA: Diagnosis not present

## 2022-02-06 DIAGNOSIS — M6259 Muscle wasting and atrophy, not elsewhere classified, multiple sites: Secondary | ICD-10-CM | POA: Diagnosis not present

## 2022-02-06 DIAGNOSIS — R293 Abnormal posture: Secondary | ICD-10-CM | POA: Diagnosis not present

## 2022-02-06 DIAGNOSIS — N179 Acute kidney failure, unspecified: Secondary | ICD-10-CM | POA: Diagnosis not present

## 2022-02-06 DIAGNOSIS — R1311 Dysphagia, oral phase: Secondary | ICD-10-CM | POA: Diagnosis not present

## 2022-02-07 DIAGNOSIS — R2681 Unsteadiness on feet: Secondary | ICD-10-CM | POA: Diagnosis not present

## 2022-02-07 DIAGNOSIS — G4733 Obstructive sleep apnea (adult) (pediatric): Secondary | ICD-10-CM | POA: Diagnosis not present

## 2022-02-07 DIAGNOSIS — R278 Other lack of coordination: Secondary | ICD-10-CM | POA: Diagnosis not present

## 2022-02-07 DIAGNOSIS — F03A0 Unspecified dementia, mild, without behavioral disturbance, psychotic disturbance, mood disturbance, and anxiety: Secondary | ICD-10-CM | POA: Diagnosis not present

## 2022-02-07 DIAGNOSIS — N179 Acute kidney failure, unspecified: Secondary | ICD-10-CM | POA: Diagnosis not present

## 2022-02-07 DIAGNOSIS — L98419 Non-pressure chronic ulcer of buttock with unspecified severity: Secondary | ICD-10-CM | POA: Diagnosis not present

## 2022-02-07 DIAGNOSIS — I10 Essential (primary) hypertension: Secondary | ICD-10-CM | POA: Diagnosis not present

## 2022-02-07 DIAGNOSIS — I5022 Chronic systolic (congestive) heart failure: Secondary | ICD-10-CM | POA: Diagnosis not present

## 2022-02-07 DIAGNOSIS — M6259 Muscle wasting and atrophy, not elsewhere classified, multiple sites: Secondary | ICD-10-CM | POA: Diagnosis not present

## 2022-02-07 DIAGNOSIS — E039 Hypothyroidism, unspecified: Secondary | ICD-10-CM | POA: Diagnosis not present

## 2022-02-07 DIAGNOSIS — I480 Paroxysmal atrial fibrillation: Secondary | ICD-10-CM | POA: Diagnosis not present

## 2022-02-07 DIAGNOSIS — R1311 Dysphagia, oral phase: Secondary | ICD-10-CM | POA: Diagnosis not present

## 2022-02-07 DIAGNOSIS — R1312 Dysphagia, oropharyngeal phase: Secondary | ICD-10-CM | POA: Diagnosis not present

## 2022-02-07 DIAGNOSIS — M6281 Muscle weakness (generalized): Secondary | ICD-10-CM | POA: Diagnosis not present

## 2022-02-07 DIAGNOSIS — N139 Obstructive and reflux uropathy, unspecified: Secondary | ICD-10-CM | POA: Diagnosis not present

## 2022-02-07 DIAGNOSIS — R293 Abnormal posture: Secondary | ICD-10-CM | POA: Diagnosis not present

## 2022-02-08 DIAGNOSIS — I4891 Unspecified atrial fibrillation: Secondary | ICD-10-CM | POA: Diagnosis not present

## 2022-02-08 DIAGNOSIS — I5030 Unspecified diastolic (congestive) heart failure: Secondary | ICD-10-CM | POA: Diagnosis not present

## 2022-02-08 DIAGNOSIS — N390 Urinary tract infection, site not specified: Secondary | ICD-10-CM | POA: Diagnosis not present

## 2022-02-08 DIAGNOSIS — N183 Chronic kidney disease, stage 3 unspecified: Secondary | ICD-10-CM | POA: Diagnosis not present

## 2022-02-09 DIAGNOSIS — K5900 Constipation, unspecified: Secondary | ICD-10-CM | POA: Diagnosis not present

## 2022-02-09 DIAGNOSIS — I4891 Unspecified atrial fibrillation: Secondary | ICD-10-CM | POA: Diagnosis not present

## 2022-02-09 DIAGNOSIS — M24541 Contracture, right hand: Secondary | ICD-10-CM | POA: Diagnosis not present

## 2022-02-09 DIAGNOSIS — K219 Gastro-esophageal reflux disease without esophagitis: Secondary | ICD-10-CM | POA: Diagnosis not present

## 2022-02-09 DIAGNOSIS — G629 Polyneuropathy, unspecified: Secondary | ICD-10-CM | POA: Diagnosis not present

## 2022-02-09 DIAGNOSIS — I5020 Unspecified systolic (congestive) heart failure: Secondary | ICD-10-CM | POA: Diagnosis not present

## 2022-02-09 DIAGNOSIS — G20 Parkinson's disease: Secondary | ICD-10-CM | POA: Diagnosis not present

## 2022-02-11 DIAGNOSIS — R1312 Dysphagia, oropharyngeal phase: Secondary | ICD-10-CM | POA: Diagnosis not present

## 2022-02-11 DIAGNOSIS — F03A0 Unspecified dementia, mild, without behavioral disturbance, psychotic disturbance, mood disturbance, and anxiety: Secondary | ICD-10-CM | POA: Diagnosis not present

## 2022-02-11 DIAGNOSIS — I10 Essential (primary) hypertension: Secondary | ICD-10-CM | POA: Diagnosis not present

## 2022-02-11 DIAGNOSIS — G4733 Obstructive sleep apnea (adult) (pediatric): Secondary | ICD-10-CM | POA: Diagnosis not present

## 2022-02-11 DIAGNOSIS — I480 Paroxysmal atrial fibrillation: Secondary | ICD-10-CM | POA: Diagnosis not present

## 2022-02-11 DIAGNOSIS — R1311 Dysphagia, oral phase: Secondary | ICD-10-CM | POA: Diagnosis not present

## 2022-02-11 DIAGNOSIS — M6281 Muscle weakness (generalized): Secondary | ICD-10-CM | POA: Diagnosis not present

## 2022-02-11 DIAGNOSIS — E039 Hypothyroidism, unspecified: Secondary | ICD-10-CM | POA: Diagnosis not present

## 2022-02-11 DIAGNOSIS — N179 Acute kidney failure, unspecified: Secondary | ICD-10-CM | POA: Diagnosis not present

## 2022-02-11 DIAGNOSIS — R2681 Unsteadiness on feet: Secondary | ICD-10-CM | POA: Diagnosis not present

## 2022-02-11 DIAGNOSIS — R278 Other lack of coordination: Secondary | ICD-10-CM | POA: Diagnosis not present

## 2022-02-11 DIAGNOSIS — M6259 Muscle wasting and atrophy, not elsewhere classified, multiple sites: Secondary | ICD-10-CM | POA: Diagnosis not present

## 2022-02-11 DIAGNOSIS — I5022 Chronic systolic (congestive) heart failure: Secondary | ICD-10-CM | POA: Diagnosis not present

## 2022-02-11 DIAGNOSIS — R293 Abnormal posture: Secondary | ICD-10-CM | POA: Diagnosis not present

## 2022-02-11 DIAGNOSIS — N139 Obstructive and reflux uropathy, unspecified: Secondary | ICD-10-CM | POA: Diagnosis not present

## 2022-02-13 DIAGNOSIS — M6281 Muscle weakness (generalized): Secondary | ICD-10-CM | POA: Diagnosis not present

## 2022-02-13 DIAGNOSIS — I10 Essential (primary) hypertension: Secondary | ICD-10-CM | POA: Diagnosis not present

## 2022-02-13 DIAGNOSIS — R278 Other lack of coordination: Secondary | ICD-10-CM | POA: Diagnosis not present

## 2022-02-13 DIAGNOSIS — N179 Acute kidney failure, unspecified: Secondary | ICD-10-CM | POA: Diagnosis not present

## 2022-02-13 DIAGNOSIS — I5022 Chronic systolic (congestive) heart failure: Secondary | ICD-10-CM | POA: Diagnosis not present

## 2022-02-13 DIAGNOSIS — F03A0 Unspecified dementia, mild, without behavioral disturbance, psychotic disturbance, mood disturbance, and anxiety: Secondary | ICD-10-CM | POA: Diagnosis not present

## 2022-02-13 DIAGNOSIS — E039 Hypothyroidism, unspecified: Secondary | ICD-10-CM | POA: Diagnosis not present

## 2022-02-13 DIAGNOSIS — R2681 Unsteadiness on feet: Secondary | ICD-10-CM | POA: Diagnosis not present

## 2022-02-13 DIAGNOSIS — R1312 Dysphagia, oropharyngeal phase: Secondary | ICD-10-CM | POA: Diagnosis not present

## 2022-02-13 DIAGNOSIS — I480 Paroxysmal atrial fibrillation: Secondary | ICD-10-CM | POA: Diagnosis not present

## 2022-02-13 DIAGNOSIS — R1311 Dysphagia, oral phase: Secondary | ICD-10-CM | POA: Diagnosis not present

## 2022-02-13 DIAGNOSIS — R293 Abnormal posture: Secondary | ICD-10-CM | POA: Diagnosis not present

## 2022-02-13 DIAGNOSIS — G4733 Obstructive sleep apnea (adult) (pediatric): Secondary | ICD-10-CM | POA: Diagnosis not present

## 2022-02-13 DIAGNOSIS — M6259 Muscle wasting and atrophy, not elsewhere classified, multiple sites: Secondary | ICD-10-CM | POA: Diagnosis not present

## 2022-02-13 DIAGNOSIS — N139 Obstructive and reflux uropathy, unspecified: Secondary | ICD-10-CM | POA: Diagnosis not present

## 2022-02-14 DIAGNOSIS — I10 Essential (primary) hypertension: Secondary | ICD-10-CM | POA: Diagnosis not present

## 2022-02-14 DIAGNOSIS — L98419 Non-pressure chronic ulcer of buttock with unspecified severity: Secondary | ICD-10-CM | POA: Diagnosis not present

## 2022-02-14 DIAGNOSIS — I5022 Chronic systolic (congestive) heart failure: Secondary | ICD-10-CM | POA: Diagnosis not present

## 2022-02-14 DIAGNOSIS — G4733 Obstructive sleep apnea (adult) (pediatric): Secondary | ICD-10-CM | POA: Diagnosis not present

## 2022-02-14 DIAGNOSIS — R278 Other lack of coordination: Secondary | ICD-10-CM | POA: Diagnosis not present

## 2022-02-14 DIAGNOSIS — R1312 Dysphagia, oropharyngeal phase: Secondary | ICD-10-CM | POA: Diagnosis not present

## 2022-02-14 DIAGNOSIS — R293 Abnormal posture: Secondary | ICD-10-CM | POA: Diagnosis not present

## 2022-02-14 DIAGNOSIS — I480 Paroxysmal atrial fibrillation: Secondary | ICD-10-CM | POA: Diagnosis not present

## 2022-02-14 DIAGNOSIS — N179 Acute kidney failure, unspecified: Secondary | ICD-10-CM | POA: Diagnosis not present

## 2022-02-14 DIAGNOSIS — N139 Obstructive and reflux uropathy, unspecified: Secondary | ICD-10-CM | POA: Diagnosis not present

## 2022-02-14 DIAGNOSIS — M6281 Muscle weakness (generalized): Secondary | ICD-10-CM | POA: Diagnosis not present

## 2022-02-14 DIAGNOSIS — R1311 Dysphagia, oral phase: Secondary | ICD-10-CM | POA: Diagnosis not present

## 2022-02-14 DIAGNOSIS — F03A0 Unspecified dementia, mild, without behavioral disturbance, psychotic disturbance, mood disturbance, and anxiety: Secondary | ICD-10-CM | POA: Diagnosis not present

## 2022-02-14 DIAGNOSIS — M6259 Muscle wasting and atrophy, not elsewhere classified, multiple sites: Secondary | ICD-10-CM | POA: Diagnosis not present

## 2022-02-14 DIAGNOSIS — E039 Hypothyroidism, unspecified: Secondary | ICD-10-CM | POA: Diagnosis not present

## 2022-02-14 DIAGNOSIS — R2681 Unsteadiness on feet: Secondary | ICD-10-CM | POA: Diagnosis not present

## 2022-02-18 DIAGNOSIS — M6281 Muscle weakness (generalized): Secondary | ICD-10-CM | POA: Diagnosis not present

## 2022-02-18 DIAGNOSIS — I10 Essential (primary) hypertension: Secondary | ICD-10-CM | POA: Diagnosis not present

## 2022-02-18 DIAGNOSIS — N179 Acute kidney failure, unspecified: Secondary | ICD-10-CM | POA: Diagnosis not present

## 2022-02-18 DIAGNOSIS — R1312 Dysphagia, oropharyngeal phase: Secondary | ICD-10-CM | POA: Diagnosis not present

## 2022-02-18 DIAGNOSIS — I5022 Chronic systolic (congestive) heart failure: Secondary | ICD-10-CM | POA: Diagnosis not present

## 2022-02-18 DIAGNOSIS — G4733 Obstructive sleep apnea (adult) (pediatric): Secondary | ICD-10-CM | POA: Diagnosis not present

## 2022-02-18 DIAGNOSIS — N139 Obstructive and reflux uropathy, unspecified: Secondary | ICD-10-CM | POA: Diagnosis not present

## 2022-02-18 DIAGNOSIS — I480 Paroxysmal atrial fibrillation: Secondary | ICD-10-CM | POA: Diagnosis not present

## 2022-02-18 DIAGNOSIS — F03A0 Unspecified dementia, mild, without behavioral disturbance, psychotic disturbance, mood disturbance, and anxiety: Secondary | ICD-10-CM | POA: Diagnosis not present

## 2022-02-18 DIAGNOSIS — E039 Hypothyroidism, unspecified: Secondary | ICD-10-CM | POA: Diagnosis not present

## 2022-02-18 DIAGNOSIS — M6259 Muscle wasting and atrophy, not elsewhere classified, multiple sites: Secondary | ICD-10-CM | POA: Diagnosis not present

## 2022-02-18 DIAGNOSIS — R293 Abnormal posture: Secondary | ICD-10-CM | POA: Diagnosis not present

## 2022-02-18 DIAGNOSIS — R1311 Dysphagia, oral phase: Secondary | ICD-10-CM | POA: Diagnosis not present

## 2022-02-18 DIAGNOSIS — R278 Other lack of coordination: Secondary | ICD-10-CM | POA: Diagnosis not present

## 2022-02-18 DIAGNOSIS — R2681 Unsteadiness on feet: Secondary | ICD-10-CM | POA: Diagnosis not present

## 2022-02-19 DIAGNOSIS — R293 Abnormal posture: Secondary | ICD-10-CM | POA: Diagnosis not present

## 2022-02-19 DIAGNOSIS — R278 Other lack of coordination: Secondary | ICD-10-CM | POA: Diagnosis not present

## 2022-02-19 DIAGNOSIS — M6259 Muscle wasting and atrophy, not elsewhere classified, multiple sites: Secondary | ICD-10-CM | POA: Diagnosis not present

## 2022-02-19 DIAGNOSIS — I10 Essential (primary) hypertension: Secondary | ICD-10-CM | POA: Diagnosis not present

## 2022-02-19 DIAGNOSIS — M6281 Muscle weakness (generalized): Secondary | ICD-10-CM | POA: Diagnosis not present

## 2022-02-19 DIAGNOSIS — F03A0 Unspecified dementia, mild, without behavioral disturbance, psychotic disturbance, mood disturbance, and anxiety: Secondary | ICD-10-CM | POA: Diagnosis not present

## 2022-02-19 DIAGNOSIS — N179 Acute kidney failure, unspecified: Secondary | ICD-10-CM | POA: Diagnosis not present

## 2022-02-19 DIAGNOSIS — E039 Hypothyroidism, unspecified: Secondary | ICD-10-CM | POA: Diagnosis not present

## 2022-02-19 DIAGNOSIS — N139 Obstructive and reflux uropathy, unspecified: Secondary | ICD-10-CM | POA: Diagnosis not present

## 2022-02-19 DIAGNOSIS — R2681 Unsteadiness on feet: Secondary | ICD-10-CM | POA: Diagnosis not present

## 2022-02-19 DIAGNOSIS — G4733 Obstructive sleep apnea (adult) (pediatric): Secondary | ICD-10-CM | POA: Diagnosis not present

## 2022-02-19 DIAGNOSIS — R1311 Dysphagia, oral phase: Secondary | ICD-10-CM | POA: Diagnosis not present

## 2022-02-19 DIAGNOSIS — I480 Paroxysmal atrial fibrillation: Secondary | ICD-10-CM | POA: Diagnosis not present

## 2022-02-19 DIAGNOSIS — R1312 Dysphagia, oropharyngeal phase: Secondary | ICD-10-CM | POA: Diagnosis not present

## 2022-02-19 DIAGNOSIS — I5022 Chronic systolic (congestive) heart failure: Secondary | ICD-10-CM | POA: Diagnosis not present

## 2022-02-20 ENCOUNTER — Ambulatory Visit (INDEPENDENT_AMBULATORY_CARE_PROVIDER_SITE_OTHER): Payer: Medicare Other

## 2022-02-20 DIAGNOSIS — M6259 Muscle wasting and atrophy, not elsewhere classified, multiple sites: Secondary | ICD-10-CM | POA: Diagnosis not present

## 2022-02-20 DIAGNOSIS — N179 Acute kidney failure, unspecified: Secondary | ICD-10-CM | POA: Diagnosis not present

## 2022-02-20 DIAGNOSIS — I5022 Chronic systolic (congestive) heart failure: Secondary | ICD-10-CM | POA: Diagnosis not present

## 2022-02-20 DIAGNOSIS — R1312 Dysphagia, oropharyngeal phase: Secondary | ICD-10-CM | POA: Diagnosis not present

## 2022-02-20 DIAGNOSIS — F03A0 Unspecified dementia, mild, without behavioral disturbance, psychotic disturbance, mood disturbance, and anxiety: Secondary | ICD-10-CM | POA: Diagnosis not present

## 2022-02-20 DIAGNOSIS — I442 Atrioventricular block, complete: Secondary | ICD-10-CM | POA: Diagnosis not present

## 2022-02-20 DIAGNOSIS — I10 Essential (primary) hypertension: Secondary | ICD-10-CM | POA: Diagnosis not present

## 2022-02-20 DIAGNOSIS — R293 Abnormal posture: Secondary | ICD-10-CM | POA: Diagnosis not present

## 2022-02-20 DIAGNOSIS — I480 Paroxysmal atrial fibrillation: Secondary | ICD-10-CM | POA: Diagnosis not present

## 2022-02-20 DIAGNOSIS — R1311 Dysphagia, oral phase: Secondary | ICD-10-CM | POA: Diagnosis not present

## 2022-02-20 DIAGNOSIS — M6281 Muscle weakness (generalized): Secondary | ICD-10-CM | POA: Diagnosis not present

## 2022-02-20 DIAGNOSIS — G4733 Obstructive sleep apnea (adult) (pediatric): Secondary | ICD-10-CM | POA: Diagnosis not present

## 2022-02-20 DIAGNOSIS — R2681 Unsteadiness on feet: Secondary | ICD-10-CM | POA: Diagnosis not present

## 2022-02-20 DIAGNOSIS — E039 Hypothyroidism, unspecified: Secondary | ICD-10-CM | POA: Diagnosis not present

## 2022-02-20 DIAGNOSIS — R278 Other lack of coordination: Secondary | ICD-10-CM | POA: Diagnosis not present

## 2022-02-20 DIAGNOSIS — N139 Obstructive and reflux uropathy, unspecified: Secondary | ICD-10-CM | POA: Diagnosis not present

## 2022-02-20 LAB — CUP PACEART REMOTE DEVICE CHECK
Battery Remaining Longevity: 54 mo
Battery Remaining Percentage: 55 %
Battery Voltage: 2.99 V
Brady Statistic AP VP Percent: 13 %
Brady Statistic AP VS Percent: 1 %
Brady Statistic AS VP Percent: 86 %
Brady Statistic AS VS Percent: 1 %
Brady Statistic RA Percent Paced: 11 %
Brady Statistic RV Percent Paced: 99 %
Date Time Interrogation Session: 20230920020013
Implantable Lead Implant Date: 20190624
Implantable Lead Implant Date: 20190624
Implantable Lead Location: 753859
Implantable Lead Location: 753860
Implantable Lead Model: 5076
Implantable Lead Model: 5076
Implantable Pulse Generator Implant Date: 20190624
Lead Channel Impedance Value: 450 Ohm
Lead Channel Impedance Value: 460 Ohm
Lead Channel Pacing Threshold Amplitude: 0.75 V
Lead Channel Pacing Threshold Amplitude: 0.75 V
Lead Channel Pacing Threshold Pulse Width: 0.5 ms
Lead Channel Pacing Threshold Pulse Width: 0.5 ms
Lead Channel Sensing Intrinsic Amplitude: 12 mV
Lead Channel Sensing Intrinsic Amplitude: 2.2 mV
Lead Channel Setting Pacing Amplitude: 2 V
Lead Channel Setting Pacing Amplitude: 2.5 V
Lead Channel Setting Pacing Pulse Width: 0.5 ms
Lead Channel Setting Sensing Sensitivity: 2 mV
Pulse Gen Model: 2272
Pulse Gen Serial Number: 9033095

## 2022-02-21 DIAGNOSIS — M6259 Muscle wasting and atrophy, not elsewhere classified, multiple sites: Secondary | ICD-10-CM | POA: Diagnosis not present

## 2022-02-21 DIAGNOSIS — R1311 Dysphagia, oral phase: Secondary | ICD-10-CM | POA: Diagnosis not present

## 2022-02-21 DIAGNOSIS — I10 Essential (primary) hypertension: Secondary | ICD-10-CM | POA: Diagnosis not present

## 2022-02-21 DIAGNOSIS — E039 Hypothyroidism, unspecified: Secondary | ICD-10-CM | POA: Diagnosis not present

## 2022-02-21 DIAGNOSIS — R1312 Dysphagia, oropharyngeal phase: Secondary | ICD-10-CM | POA: Diagnosis not present

## 2022-02-21 DIAGNOSIS — I5022 Chronic systolic (congestive) heart failure: Secondary | ICD-10-CM | POA: Diagnosis not present

## 2022-02-21 DIAGNOSIS — R293 Abnormal posture: Secondary | ICD-10-CM | POA: Diagnosis not present

## 2022-02-21 DIAGNOSIS — M6281 Muscle weakness (generalized): Secondary | ICD-10-CM | POA: Diagnosis not present

## 2022-02-21 DIAGNOSIS — L89150 Pressure ulcer of sacral region, unstageable: Secondary | ICD-10-CM | POA: Diagnosis not present

## 2022-02-21 DIAGNOSIS — F03A0 Unspecified dementia, mild, without behavioral disturbance, psychotic disturbance, mood disturbance, and anxiety: Secondary | ICD-10-CM | POA: Diagnosis not present

## 2022-02-21 DIAGNOSIS — R278 Other lack of coordination: Secondary | ICD-10-CM | POA: Diagnosis not present

## 2022-02-21 DIAGNOSIS — I480 Paroxysmal atrial fibrillation: Secondary | ICD-10-CM | POA: Diagnosis not present

## 2022-02-21 DIAGNOSIS — N139 Obstructive and reflux uropathy, unspecified: Secondary | ICD-10-CM | POA: Diagnosis not present

## 2022-02-21 DIAGNOSIS — G4733 Obstructive sleep apnea (adult) (pediatric): Secondary | ICD-10-CM | POA: Diagnosis not present

## 2022-02-21 DIAGNOSIS — N179 Acute kidney failure, unspecified: Secondary | ICD-10-CM | POA: Diagnosis not present

## 2022-02-21 DIAGNOSIS — R2681 Unsteadiness on feet: Secondary | ICD-10-CM | POA: Diagnosis not present

## 2022-02-22 DIAGNOSIS — R338 Other retention of urine: Secondary | ICD-10-CM | POA: Diagnosis not present

## 2022-02-24 DIAGNOSIS — M6281 Muscle weakness (generalized): Secondary | ICD-10-CM | POA: Diagnosis not present

## 2022-02-24 DIAGNOSIS — I5020 Unspecified systolic (congestive) heart failure: Secondary | ICD-10-CM | POA: Diagnosis not present

## 2022-02-24 DIAGNOSIS — G629 Polyneuropathy, unspecified: Secondary | ICD-10-CM | POA: Diagnosis not present

## 2022-02-24 DIAGNOSIS — I10 Essential (primary) hypertension: Secondary | ICD-10-CM | POA: Diagnosis not present

## 2022-02-24 DIAGNOSIS — K219 Gastro-esophageal reflux disease without esophagitis: Secondary | ICD-10-CM | POA: Diagnosis not present

## 2022-02-24 DIAGNOSIS — I4891 Unspecified atrial fibrillation: Secondary | ICD-10-CM | POA: Diagnosis not present

## 2022-02-24 DIAGNOSIS — G20 Parkinson's disease: Secondary | ICD-10-CM | POA: Diagnosis not present

## 2022-02-25 DIAGNOSIS — I10 Essential (primary) hypertension: Secondary | ICD-10-CM | POA: Diagnosis not present

## 2022-02-25 DIAGNOSIS — G4733 Obstructive sleep apnea (adult) (pediatric): Secondary | ICD-10-CM | POA: Diagnosis not present

## 2022-02-25 DIAGNOSIS — I5022 Chronic systolic (congestive) heart failure: Secondary | ICD-10-CM | POA: Diagnosis not present

## 2022-02-25 DIAGNOSIS — I480 Paroxysmal atrial fibrillation: Secondary | ICD-10-CM | POA: Diagnosis not present

## 2022-02-25 DIAGNOSIS — E039 Hypothyroidism, unspecified: Secondary | ICD-10-CM | POA: Diagnosis not present

## 2022-02-25 DIAGNOSIS — R1311 Dysphagia, oral phase: Secondary | ICD-10-CM | POA: Diagnosis not present

## 2022-02-25 DIAGNOSIS — R2681 Unsteadiness on feet: Secondary | ICD-10-CM | POA: Diagnosis not present

## 2022-02-25 DIAGNOSIS — R1312 Dysphagia, oropharyngeal phase: Secondary | ICD-10-CM | POA: Diagnosis not present

## 2022-02-25 DIAGNOSIS — N139 Obstructive and reflux uropathy, unspecified: Secondary | ICD-10-CM | POA: Diagnosis not present

## 2022-02-25 DIAGNOSIS — M6259 Muscle wasting and atrophy, not elsewhere classified, multiple sites: Secondary | ICD-10-CM | POA: Diagnosis not present

## 2022-02-25 DIAGNOSIS — R293 Abnormal posture: Secondary | ICD-10-CM | POA: Diagnosis not present

## 2022-02-25 DIAGNOSIS — R278 Other lack of coordination: Secondary | ICD-10-CM | POA: Diagnosis not present

## 2022-02-25 DIAGNOSIS — F03A0 Unspecified dementia, mild, without behavioral disturbance, psychotic disturbance, mood disturbance, and anxiety: Secondary | ICD-10-CM | POA: Diagnosis not present

## 2022-02-25 DIAGNOSIS — N179 Acute kidney failure, unspecified: Secondary | ICD-10-CM | POA: Diagnosis not present

## 2022-02-25 DIAGNOSIS — M6281 Muscle weakness (generalized): Secondary | ICD-10-CM | POA: Diagnosis not present

## 2022-02-26 DIAGNOSIS — M6281 Muscle weakness (generalized): Secondary | ICD-10-CM | POA: Diagnosis not present

## 2022-02-26 DIAGNOSIS — M6259 Muscle wasting and atrophy, not elsewhere classified, multiple sites: Secondary | ICD-10-CM | POA: Diagnosis not present

## 2022-02-26 DIAGNOSIS — N139 Obstructive and reflux uropathy, unspecified: Secondary | ICD-10-CM | POA: Diagnosis not present

## 2022-02-26 DIAGNOSIS — I480 Paroxysmal atrial fibrillation: Secondary | ICD-10-CM | POA: Diagnosis not present

## 2022-02-26 DIAGNOSIS — G4733 Obstructive sleep apnea (adult) (pediatric): Secondary | ICD-10-CM | POA: Diagnosis not present

## 2022-02-26 DIAGNOSIS — R2681 Unsteadiness on feet: Secondary | ICD-10-CM | POA: Diagnosis not present

## 2022-02-26 DIAGNOSIS — R293 Abnormal posture: Secondary | ICD-10-CM | POA: Diagnosis not present

## 2022-02-26 DIAGNOSIS — R278 Other lack of coordination: Secondary | ICD-10-CM | POA: Diagnosis not present

## 2022-02-26 DIAGNOSIS — F03A0 Unspecified dementia, mild, without behavioral disturbance, psychotic disturbance, mood disturbance, and anxiety: Secondary | ICD-10-CM | POA: Diagnosis not present

## 2022-02-26 DIAGNOSIS — R1312 Dysphagia, oropharyngeal phase: Secondary | ICD-10-CM | POA: Diagnosis not present

## 2022-02-26 DIAGNOSIS — I5022 Chronic systolic (congestive) heart failure: Secondary | ICD-10-CM | POA: Diagnosis not present

## 2022-02-26 DIAGNOSIS — R1311 Dysphagia, oral phase: Secondary | ICD-10-CM | POA: Diagnosis not present

## 2022-02-26 DIAGNOSIS — E039 Hypothyroidism, unspecified: Secondary | ICD-10-CM | POA: Diagnosis not present

## 2022-02-26 DIAGNOSIS — N179 Acute kidney failure, unspecified: Secondary | ICD-10-CM | POA: Diagnosis not present

## 2022-02-26 DIAGNOSIS — I10 Essential (primary) hypertension: Secondary | ICD-10-CM | POA: Diagnosis not present

## 2022-02-28 DIAGNOSIS — L89150 Pressure ulcer of sacral region, unstageable: Secondary | ICD-10-CM | POA: Diagnosis not present

## 2022-03-04 DIAGNOSIS — I10 Essential (primary) hypertension: Secondary | ICD-10-CM | POA: Diagnosis not present

## 2022-03-04 DIAGNOSIS — I4891 Unspecified atrial fibrillation: Secondary | ICD-10-CM | POA: Diagnosis not present

## 2022-03-04 DIAGNOSIS — M24541 Contracture, right hand: Secondary | ICD-10-CM | POA: Diagnosis not present

## 2022-03-04 DIAGNOSIS — M1990 Unspecified osteoarthritis, unspecified site: Secondary | ICD-10-CM | POA: Diagnosis not present

## 2022-03-04 DIAGNOSIS — I5020 Unspecified systolic (congestive) heart failure: Secondary | ICD-10-CM | POA: Diagnosis not present

## 2022-03-05 DIAGNOSIS — R69 Illness, unspecified: Secondary | ICD-10-CM | POA: Diagnosis not present

## 2022-03-05 NOTE — Progress Notes (Signed)
Remote pacemaker transmission.   

## 2022-03-18 ENCOUNTER — Encounter: Payer: Self-pay | Admitting: Neurology

## 2022-04-03 DEATH — deceased

## 2022-06-13 ENCOUNTER — Ambulatory Visit: Payer: Medicare Other | Admitting: Neurology

## 2022-07-21 IMAGING — CT NM PET TUM IMG INITIAL (PI) WHOLE BODY
4 series · 25 of 25 positions shown · non-contrast
Comparison: None Available.

CLINICAL DATA: Dementia. Frontotemporal dementia versus Alzheimer's
type dementia. 81-year-old male with frontotemporal
dementia/Parkinson's with memory deficit.

EXAM:
NM PET METABOLIC BRAIN
TECHNIQUE: 9.3 mCi F-18 FDG was injected intravenously. Full-ring PET imaging
was performed from the vertex to skull base. CT data was obtained
and used for attenuation correction and anatomic localization.
FASTING BLOOD GLUCOSE:  Value: 104 mg/dl

[Series 6: pet brain ac · axial · 3.0mm · 2.04mm/px · z∈[-111,+53]mm · 7 of 83 slices shown]
[im 1/83]
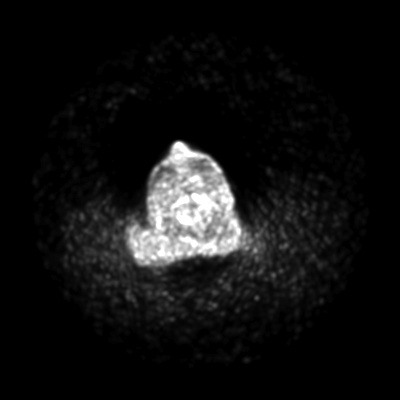
[im 14/83]
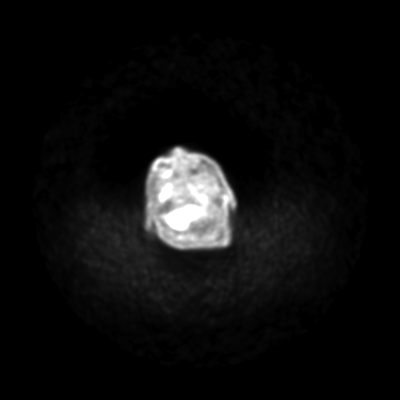
[im 28/83]
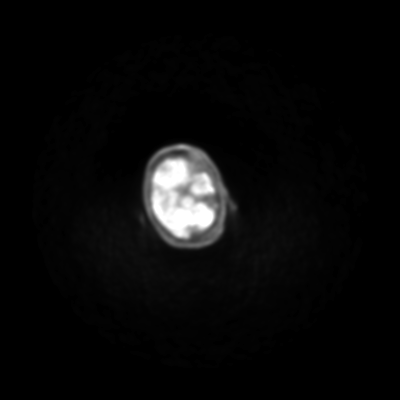
[im 42/83]
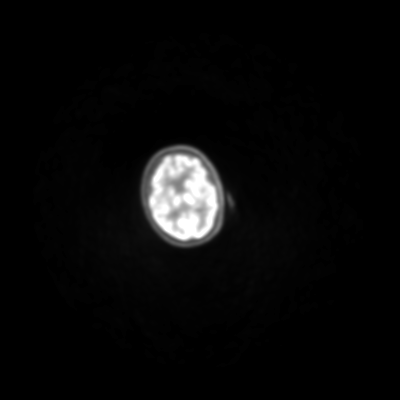
[im 55/83]
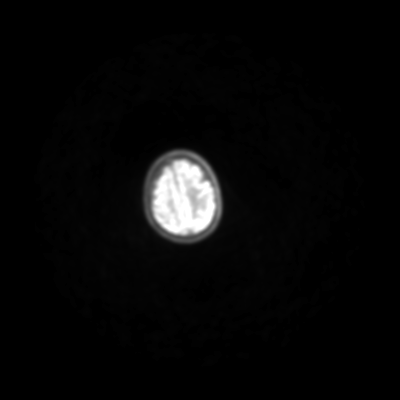
[im 69/83]
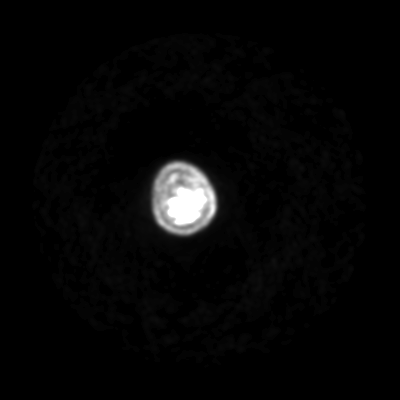
[im 83/83]
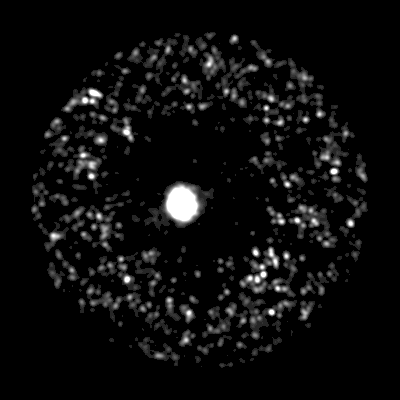

[Series 7: ct brain 3.0 hr38 · axial · 3.0mm · 0.59mm/px · z∈[-111,+53]mm · 7 of 83 slices shown]
[im 1/83  brain]
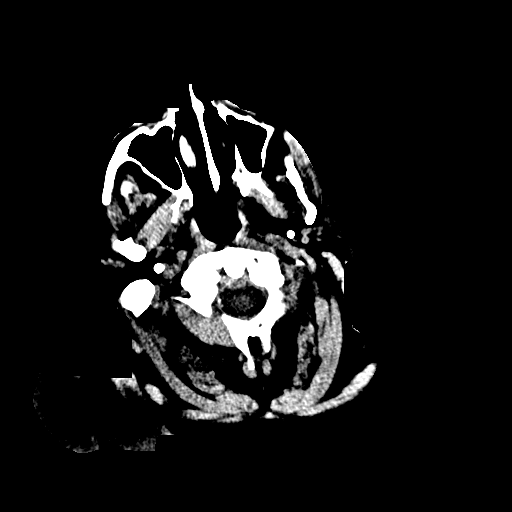
[im 14/83  bone]
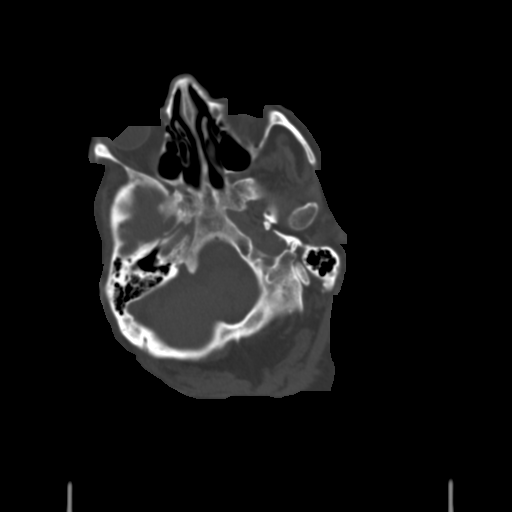
[im 28/83  brain]
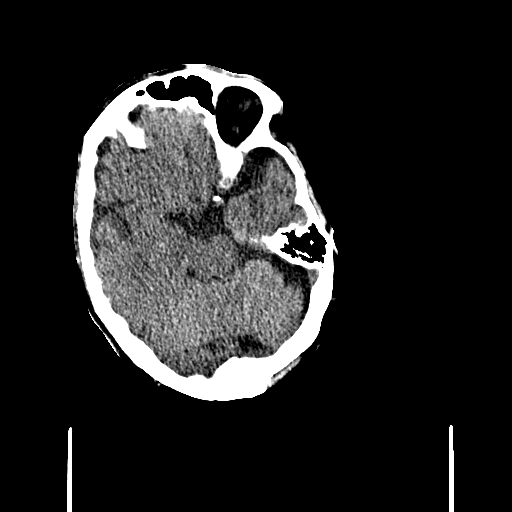
[im 42/83  brain]
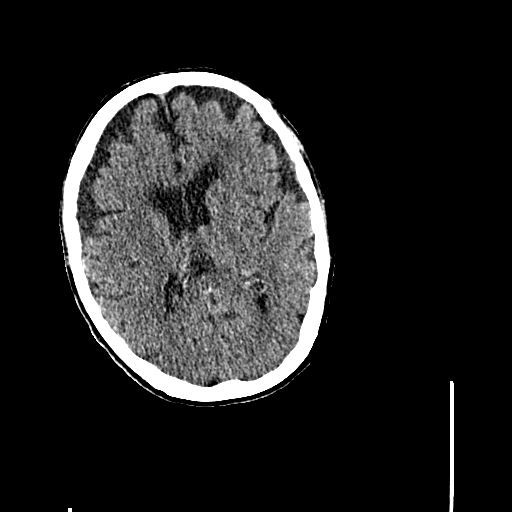
[im 55/83  brain]
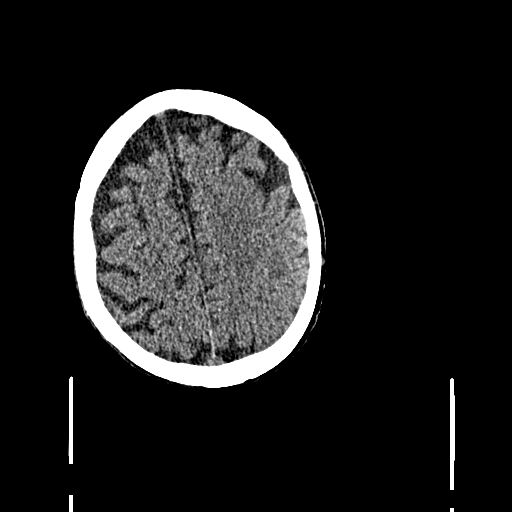
[im 69/83  bone]
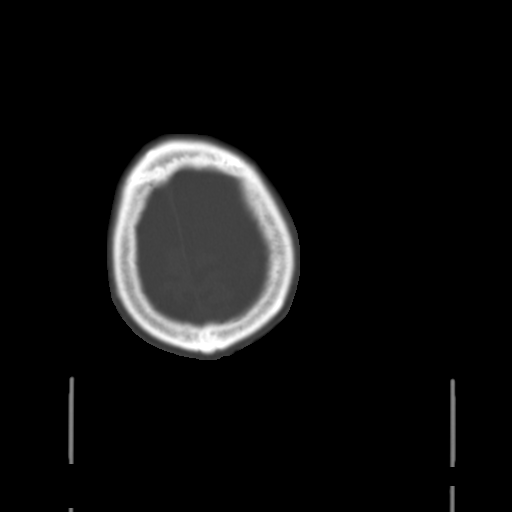
[im 83/83  bone]
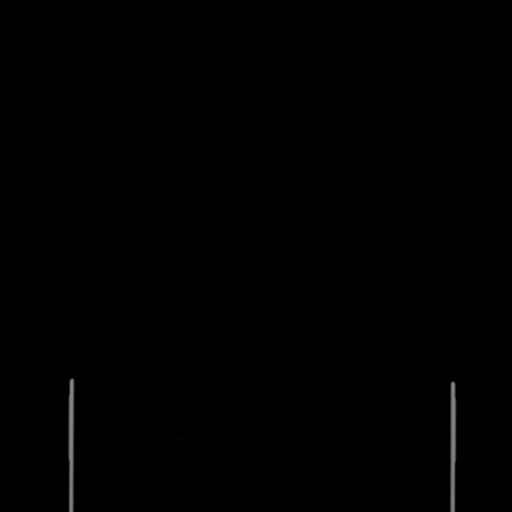

[Series 8: pet brain ac itr · axial · 3.0mm · 4.07mm/px · z∈[-111,+53]mm · 7 of 83 slices shown]
[im 1/83]
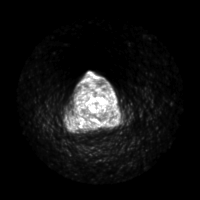
[im 14/83]
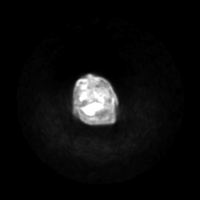
[im 28/83]
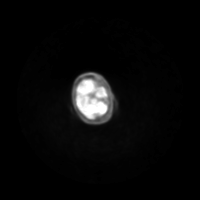
[im 42/83]
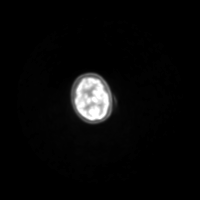
[im 55/83]
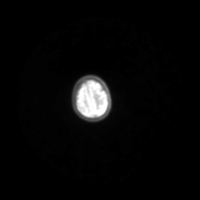
[im 69/83]
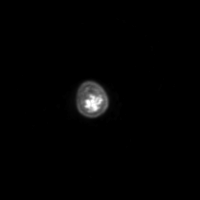
[im 83/83]
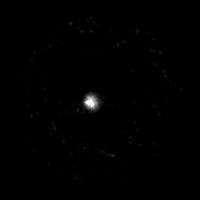

[Series 603: range-ct brain 3.0 hr38-cor-<alpha range> · 4 of 52 slices shown]
[im 1/52]
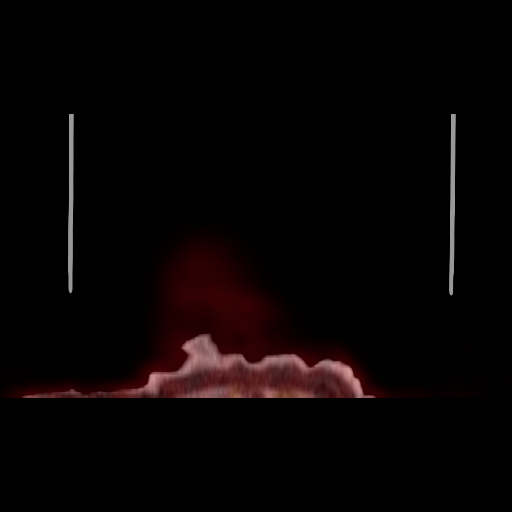
[im 18/52]
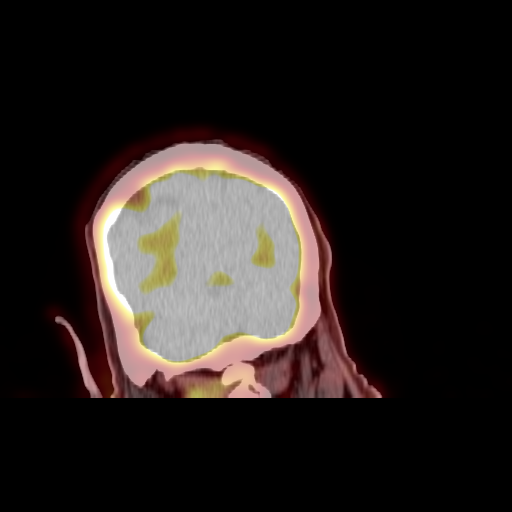
[im 35/52]
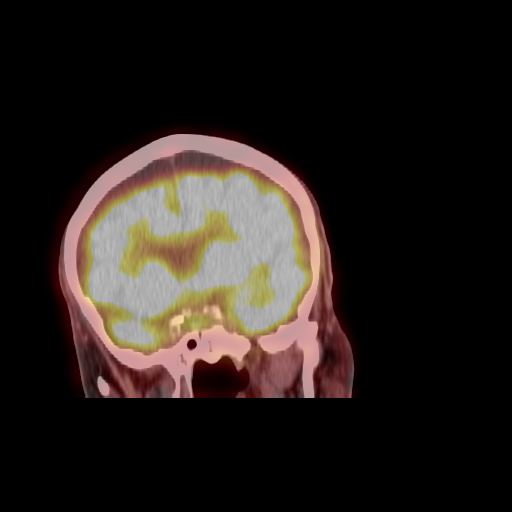
[im 52/52]
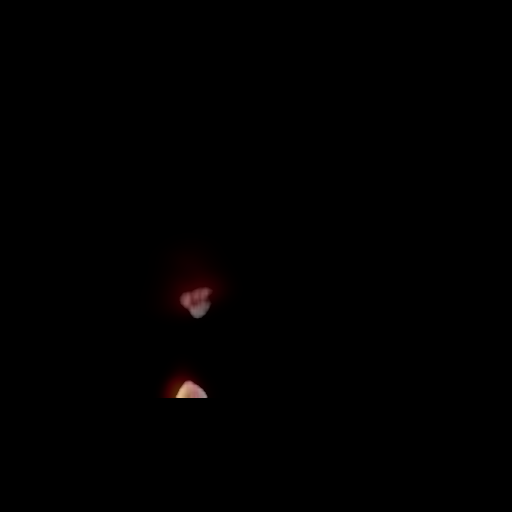

[25 of 25 positions shown; findings below may reference images not displayed]

FINDINGS: There is no decreased relative cortical metabolism within the
frontal lobes. No decreased relative cortical metabolism within the
bi parietal lobes. Normal cortical metabolism within the occipital
lobes. Generalized cortical atrophy.
IMPRESSION: No decreased relative cortical metabolism to suggest frontotemporal
dementia or Alzheimer's disease pathology.
